# Patient Record
Sex: Female | Born: 1935 | Race: White | Hispanic: No | Smoking: Former smoker
Health system: Southern US, Community
[De-identification: ages and names within clinical notes are randomized; demographics above are authoritative.]

## PROBLEM LIST (undated history)

## (undated) DIAGNOSIS — K219 Gastro-esophageal reflux disease without esophagitis: Secondary | ICD-10-CM

## (undated) DIAGNOSIS — M1990 Unspecified osteoarthritis, unspecified site: Secondary | ICD-10-CM

## (undated) DIAGNOSIS — E785 Hyperlipidemia, unspecified: Secondary | ICD-10-CM

## (undated) DIAGNOSIS — R7989 Other specified abnormal findings of blood chemistry: Secondary | ICD-10-CM

## (undated) DIAGNOSIS — I5032 Chronic diastolic (congestive) heart failure: Secondary | ICD-10-CM

## (undated) DIAGNOSIS — J189 Pneumonia, unspecified organism: Secondary | ICD-10-CM

## (undated) DIAGNOSIS — E039 Hypothyroidism, unspecified: Secondary | ICD-10-CM

## (undated) DIAGNOSIS — E7849 Other hyperlipidemia: Secondary | ICD-10-CM

## (undated) DIAGNOSIS — I10 Essential (primary) hypertension: Secondary | ICD-10-CM

## (undated) DIAGNOSIS — R778 Other specified abnormalities of plasma proteins: Secondary | ICD-10-CM

## (undated) DIAGNOSIS — I495 Sick sinus syndrome: Secondary | ICD-10-CM

## (undated) DIAGNOSIS — I480 Paroxysmal atrial fibrillation: Secondary | ICD-10-CM

## (undated) DIAGNOSIS — I2510 Atherosclerotic heart disease of native coronary artery without angina pectoris: Secondary | ICD-10-CM

## (undated) DIAGNOSIS — K5792 Diverticulitis of intestine, part unspecified, without perforation or abscess without bleeding: Secondary | ICD-10-CM

## (undated) DIAGNOSIS — Z90711 Acquired absence of uterus with remaining cervical stump: Secondary | ICD-10-CM

## (undated) DIAGNOSIS — R4701 Aphasia: Secondary | ICD-10-CM

## (undated) DIAGNOSIS — Z87442 Personal history of urinary calculi: Secondary | ICD-10-CM

## (undated) DIAGNOSIS — Z9889 Other specified postprocedural states: Secondary | ICD-10-CM

## (undated) DIAGNOSIS — I214 Non-ST elevation (NSTEMI) myocardial infarction: Secondary | ICD-10-CM

## (undated) DIAGNOSIS — Z950 Presence of cardiac pacemaker: Secondary | ICD-10-CM

## (undated) HISTORY — PX: KNEE ARTHROSCOPY: SHX127

## (undated) HISTORY — DX: Other hyperlipidemia: E78.49

## (undated) HISTORY — PX: BLADDER SUSPENSION: SHX72

## (undated) HISTORY — DX: Acquired absence of uterus with remaining cervical stump: Z90.711

## (undated) HISTORY — PX: CARPAL TUNNEL RELEASE: SHX101

## (undated) HISTORY — DX: Sick sinus syndrome: I49.5

## (undated) HISTORY — PX: THYROIDECTOMY, PARTIAL: SHX18

## (undated) HISTORY — DX: Other specified abnormalities of plasma proteins: R77.8

## (undated) HISTORY — DX: Other specified abnormal findings of blood chemistry: R79.89

## (undated) HISTORY — PX: CHOLECYSTECTOMY: SHX55

---
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
## 1966-01-16 HISTORY — PX: THYROIDECTOMY: SHX17

## 1975-01-17 DIAGNOSIS — Z90711 Acquired absence of uterus with remaining cervical stump: Secondary | ICD-10-CM | POA: Insufficient documentation

## 1975-01-17 HISTORY — DX: Acquired absence of uterus with remaining cervical stump: Z90.711

## 1975-01-17 HISTORY — PX: ABDOMINAL HYSTERECTOMY: SHX81

## 1997-06-05 ENCOUNTER — Other Ambulatory Visit: Admission: RE | Admit: 1997-06-05 | Discharge: 1997-06-05 | Payer: Self-pay | Admitting: Cardiology

## 1998-01-26 ENCOUNTER — Encounter: Payer: Self-pay | Admitting: Surgery

## 1998-01-29 ENCOUNTER — Encounter: Payer: Self-pay | Admitting: Surgery

## 1998-01-29 ENCOUNTER — Ambulatory Visit (HOSPITAL_COMMUNITY): Admission: RE | Admit: 1998-01-29 | Discharge: 1998-01-30 | Payer: Self-pay | Admitting: Surgery

## 1998-12-16 ENCOUNTER — Encounter: Payer: Self-pay | Admitting: Family Medicine

## 1998-12-16 ENCOUNTER — Encounter: Admission: RE | Admit: 1998-12-16 | Discharge: 1998-12-16 | Payer: Self-pay | Admitting: Family Medicine

## 1999-07-05 ENCOUNTER — Encounter: Admission: RE | Admit: 1999-07-05 | Discharge: 1999-07-05 | Payer: Self-pay

## 2000-07-30 ENCOUNTER — Encounter: Payer: Self-pay | Admitting: Family Medicine

## 2000-07-30 ENCOUNTER — Encounter: Admission: RE | Admit: 2000-07-30 | Discharge: 2000-07-30 | Payer: Self-pay | Admitting: Family Medicine

## 2001-01-16 HISTORY — PX: MITRAL VALVE REPAIR: SHX2039

## 2001-01-16 HISTORY — PX: CORONARY ARTERY BYPASS GRAFT: SHX141

## 2001-02-18 ENCOUNTER — Encounter: Payer: Self-pay | Admitting: Orthopedic Surgery

## 2001-02-18 ENCOUNTER — Ambulatory Visit (HOSPITAL_COMMUNITY): Admission: RE | Admit: 2001-02-18 | Discharge: 2001-02-18 | Payer: Self-pay | Admitting: Orthopedic Surgery

## 2001-04-08 ENCOUNTER — Encounter: Admission: RE | Admit: 2001-04-08 | Discharge: 2001-04-08 | Payer: Self-pay | Admitting: Orthopedic Surgery

## 2001-04-08 ENCOUNTER — Encounter: Payer: Self-pay | Admitting: Orthopedic Surgery

## 2001-04-16 ENCOUNTER — Ambulatory Visit (HOSPITAL_BASED_OUTPATIENT_CLINIC_OR_DEPARTMENT_OTHER): Admission: RE | Admit: 2001-04-16 | Discharge: 2001-04-17 | Payer: Self-pay | Admitting: Orthopedic Surgery

## 2001-04-16 ENCOUNTER — Ambulatory Visit (HOSPITAL_BASED_OUTPATIENT_CLINIC_OR_DEPARTMENT_OTHER): Admission: RE | Admit: 2001-04-16 | Discharge: 2001-04-16 | Payer: Self-pay | Admitting: Orthopedic Surgery

## 2001-08-26 ENCOUNTER — Encounter: Payer: Self-pay | Admitting: *Deleted

## 2001-08-26 ENCOUNTER — Inpatient Hospital Stay (HOSPITAL_COMMUNITY): Admission: EM | Admit: 2001-08-26 | Discharge: 2001-09-04 | Payer: Self-pay | Admitting: *Deleted

## 2001-08-26 ENCOUNTER — Encounter (INDEPENDENT_AMBULATORY_CARE_PROVIDER_SITE_OTHER): Payer: Self-pay | Admitting: *Deleted

## 2001-08-27 ENCOUNTER — Encounter: Payer: Self-pay | Admitting: Thoracic Surgery (Cardiothoracic Vascular Surgery)

## 2001-08-28 ENCOUNTER — Encounter: Payer: Self-pay | Admitting: Thoracic Surgery (Cardiothoracic Vascular Surgery)

## 2001-08-29 ENCOUNTER — Encounter: Payer: Self-pay | Admitting: Thoracic Surgery (Cardiothoracic Vascular Surgery)

## 2001-08-30 ENCOUNTER — Encounter: Payer: Self-pay | Admitting: Thoracic Surgery (Cardiothoracic Vascular Surgery)

## 2001-09-30 ENCOUNTER — Encounter (HOSPITAL_COMMUNITY): Admission: RE | Admit: 2001-09-30 | Discharge: 2001-12-29 | Payer: Self-pay | Admitting: Cardiology

## 2001-10-11 ENCOUNTER — Encounter: Payer: Self-pay | Admitting: Emergency Medicine

## 2001-10-11 ENCOUNTER — Emergency Department (HOSPITAL_COMMUNITY): Admission: EM | Admit: 2001-10-11 | Discharge: 2001-10-11 | Payer: Self-pay | Admitting: Emergency Medicine

## 2002-02-06 ENCOUNTER — Ambulatory Visit (HOSPITAL_COMMUNITY): Admission: RE | Admit: 2002-02-06 | Discharge: 2002-02-06 | Payer: Self-pay | Admitting: Family Medicine

## 2003-02-03 ENCOUNTER — Ambulatory Visit (HOSPITAL_COMMUNITY): Admission: RE | Admit: 2003-02-03 | Discharge: 2003-02-03 | Payer: Self-pay | Admitting: Pediatrics

## 2003-07-16 ENCOUNTER — Encounter: Admission: RE | Admit: 2003-07-16 | Discharge: 2003-07-16 | Payer: Self-pay | Admitting: Cardiology

## 2003-10-23 ENCOUNTER — Emergency Department (HOSPITAL_COMMUNITY): Admission: EM | Admit: 2003-10-23 | Discharge: 2003-10-23 | Payer: Self-pay | Admitting: Emergency Medicine

## 2003-11-11 ENCOUNTER — Encounter: Admission: RE | Admit: 2003-11-11 | Discharge: 2003-11-11 | Payer: Self-pay | Admitting: Family Medicine

## 2004-08-19 ENCOUNTER — Encounter: Admission: RE | Admit: 2004-08-19 | Discharge: 2004-08-19 | Payer: Self-pay | Admitting: Family Medicine

## 2005-10-19 ENCOUNTER — Encounter: Admission: RE | Admit: 2005-10-19 | Discharge: 2005-10-19 | Payer: Self-pay | Admitting: Cardiology

## 2006-02-13 ENCOUNTER — Encounter: Admission: RE | Admit: 2006-02-13 | Discharge: 2006-02-13 | Payer: Self-pay | Admitting: Family Medicine

## 2006-12-11 ENCOUNTER — Encounter: Admission: RE | Admit: 2006-12-11 | Discharge: 2006-12-11 | Payer: Self-pay | Admitting: Cardiology

## 2006-12-19 ENCOUNTER — Encounter: Admission: RE | Admit: 2006-12-19 | Discharge: 2006-12-19 | Payer: Self-pay | Admitting: Family Medicine

## 2008-02-22 ENCOUNTER — Emergency Department (HOSPITAL_COMMUNITY): Admission: EM | Admit: 2008-02-22 | Discharge: 2008-02-22 | Payer: Self-pay | Admitting: Emergency Medicine

## 2009-01-18 ENCOUNTER — Encounter: Admission: RE | Admit: 2009-01-18 | Discharge: 2009-01-18 | Payer: Self-pay | Admitting: Cardiology

## 2009-02-11 ENCOUNTER — Inpatient Hospital Stay (HOSPITAL_BASED_OUTPATIENT_CLINIC_OR_DEPARTMENT_OTHER): Admission: RE | Admit: 2009-02-11 | Discharge: 2009-02-11 | Payer: Self-pay | Admitting: Cardiology

## 2009-03-20 HISTORY — PX: CARDIAC CATHETERIZATION: SHX172

## 2009-12-07 ENCOUNTER — Ambulatory Visit: Payer: Self-pay | Admitting: Cardiology

## 2010-02-06 ENCOUNTER — Encounter: Payer: Self-pay | Admitting: Family Medicine

## 2010-05-03 LAB — BASIC METABOLIC PANEL
BUN: 18 mg/dL (ref 6–23)
CO2: 28 mEq/L (ref 19–32)
Calcium: 9.8 mg/dL (ref 8.4–10.5)
Chloride: 96 mEq/L (ref 96–112)
Creatinine, Ser: 0.96 mg/dL (ref 0.4–1.2)
GFR calc Af Amer: >60 mL/min (ref >60)
GFR calc non Af Amer: 57 mL/min — ABNORMAL LOW (ref >60)
Glucose, Bld: 112 mg/dL — ABNORMAL HIGH (ref 70–99)
Potassium: 3.6 mEq/L (ref 3.5–5.1)
Sodium: 137 mEq/L (ref 135–145)

## 2010-05-03 LAB — DIFFERENTIAL
Basophils Absolute: 0 10*3/uL (ref 0.0–0.1)
Basophils Relative: 0 % (ref 0–1)
Eosinophils Absolute: 0.1 10*3/uL (ref 0.0–0.7)
Eosinophils Relative: 1 % (ref 0–5)
Lymphocytes Relative: 12 % (ref 12–46)
Lymphs Abs: 0.8 10*3/uL (ref 0.7–4.0)
Monocytes Absolute: 0.3 10*3/uL (ref 0.1–1.0)
Monocytes Relative: 5 % (ref 3–12)
Neutro Abs: 5.5 10*3/uL (ref 1.7–7.7)
Neutrophils Relative %: 81 % — ABNORMAL HIGH (ref 43–77)

## 2010-05-03 LAB — CBC
HCT: 36.9 % (ref 36.0–46.0)
Hemoglobin: 12.8 g/dL (ref 12.0–15.0)
MCHC: 34.8 g/dL (ref 30.0–36.0)
MCV: 88.9 fL (ref 78.0–100.0)
Platelets: 198 10*3/uL (ref 150–400)
RBC: 4.15 MIL/uL (ref 3.87–5.11)
RDW: 13.3 % (ref 11.5–15.5)
WBC: 6.8 10*3/uL (ref 4.0–10.5)

## 2010-06-03 NOTE — Cardiovascular Report (Signed)
NAME:  Heather [name], Heather [name]                        ACCOUNT NO.:  [id number]   MEDICAL RECORD NO.:  [id number]                   PATIENT TYPE:  INP   LOCATION:  2305                                 FACILITY:  [facility]   PHYSICIAN:  W. Doristine Church., M.D.         DATE OF BIRTH:  05-29-1930   DATE OF PROCEDURE:  08/27/2001  DATE OF DISCHARGE:                              CARDIAC CATHETERIZATION   HISTORY:  A 77 year old female with unstable angina with new T wave changes  noted inferiorly.   COMMENTS ABOUT PROCEDURE:  The patient tolerated the procedure well without  complications.  Following the procedure, she had an episode of angina  requiring nitroglycerin both intravenously and sublingual for relief and  good hemostasis, peripheral pulses present following the procedure.   HEMODYNAMIC DATA:  1. Aorta post contrast 133/58.  2. LV post contrast 133/23.   ANGIOGRAPHIC DATA:  1. Left ventriculogram:  Performed in the 30-degree RAO projection.  The     aortic valve is normal.  The mitral valve is normal.  There is mitral     annular calcification present, however.  The left ventricle is normal in     size with normal wall motion.  The estimated ejection fraction was 60%.  2. Coronary arteries arise and distribute normally.  Calcification is seen     involving the LAD and the proximal right coronary artery.  3. The left main coronary artery appears normal.  4. The left anterior descending has a segmental 50-60% stenosis at the     diagonal branch extending to the mid portion.  Mild irregularities are     present distally.  The diagonal branch has a ostial 60% stenosis.  5. The circumflex has an eccentric 60-70% mid vessel stenosis prior to a     large marginal branch.  6. The right coronary artery _____________ is diffusely diseased and     calcified proximally followed by an area of 80% eccentric narrowing and     haziness, then another 95-99% narrowing which is eccentric, followed  by a     long segment of diffuse 60-70% narrowing prior to the posterolateral     branch.  Collaterals from the posterolateral branch are seen from the     left coronary system.   IMPRESSION:  1. Significant three-vessel coronary artery disease, predominant right     coronary artery and circumflex, but the left anterior descending artery     is moderately severe.  2. Preserved left ventricular function with mitral annular calcification,     elevated left ventricular end diastolic pressure.    RECOMMENDATIONS:  The right coronary artery appears somewhat unfavorable for  percutaneous intervention due to the length and diffuseness of disease.  She  has continued chest pain despite medical therapy and may be referred for  bypass grafting.  Kerry Hough., M.D.    WST/MEDQ  D:  08/27/2001  T:  08/30/2001  Job:  [id number]   cc:   Thomas A. Mare Ferrari, M.D.

## 2010-06-03 NOTE — Discharge Summary (Signed)
NAME:  Heather [name], Heather [name]                        ACCOUNT NO.:  [id number]   MEDICAL RECORD NO.:  [id number]                   PATIENT TYPE:  INP   LOCATION:  2003                                 FACILITY:  [facility]   PHYSICIAN:  Valentina Gu. Roxy Manns, M.D.              DATE OF BIRTH:  1930/03/05   DATE OF ADMISSION:  08/26/2001  DATE OF DISCHARGE:  09/04/2001                                 DISCHARGE SUMMARY   PRIMARY ADMISSION DIAGNOSES:  1. Severe three-vessel coronary artery disease.  2. Unstable angina.   ADDITIONAL DIAGNOSES:  1. History of myocardial infarction in 1980.  2. History of congestive heart failure.  3. Hypertriglyceridemia.  4. History of partial thyroidectomy.  5. 3+ mitral regurgitation.  6. Postoperative atrial fibrillation.   PROCEDURES PERFORMED:  1. Cardiac catheterization.  2. Coronary artery bypass grafting x3 (left internal mammary artery to the     LAD, saphenous vein graft to the circumflex, and saphenous vein graft to     the posterolateral).  3. Endoscopic vein harvest right thigh to below knee.  4. Mitral valve repair with #26 Seguin annuloplasty ring.   HISTORY OF PRESENT ILLNESS:  The patient is a 77 year old female with a  history of previous myocardial infarction in 86.  She had sone well until  the date of this admission at which time she developed left-sided,  substernal chest pain.  She took a sublingual nitroglycerin and did obtain  relief; however, she continued to have multiple episodes of pain throughout  the day.  Her fourth episode persisted despite several sublingual  nitroglycerin tablets and therefore she called EMS and was brought to the  emergency room at [facility].  She was seen by cardiology and was  admitted for further evaluation.   HOSPITAL COURSE:  She was ruled out for myocardial infarction.  She did  undergo cardiac catheterization which showed severe three-vessel coronary  artery disease.  It was recommended  that she proceed with surgical  revascularization at this time.  She was seen by Dr. Roxy Manns and it was agreed  that coronary artery bypass graft was her best option.  She was taken to the  operating room on August 13 and underwent coronary artery bypass graft x3  with the above-noted grafts.  Preoperative transesophageal echocardiogram  noted moderate, 3+ mitral regurgitation; therefore, mitral valve repair was  also undertaken with a #26 Seguin annuloplasty ring.  She tolerated the  procedure well and no evidence of mitral regurgitation was noted on  postoperative transesophageal echocardiogram.  She was transferred to the  SICU in stable condition.  She remained intubated until the morning of  postoperative day #1 at which time she was extubated without difficulty.  She was initially maintained on dopamine and Neo-Synephrine drips; however,  these were weaned and discontinued on postoperative day #1.  By  postoperative day #2 she developed atrial fibrillation and was started on an  IV amiodarone drip.  Late in the day on postoperative day #2 she was  switched to p.o. amiodarone, was started on Coumadin and was transferred to  the floor.  She was also transfused one unit of packed red blood cells at  that time for a hemoglobin of 7.8 and a hematocrit of 23.  This did bring  her hemoglobin and hematocrit up to 8.3 and 24.4, respectively.  However,  over the course of the next several days she continued to remain hypotensive  and complained of dizziness.  A repeat blood count showed persistent anemia  of 7.9 and 23.1, respectively.  She was again transfused as she had been  significantly symptomatic.  Her most recent hemoglobin and hematocrit are  10.8 and 31, and she is maintaining blood pressures within normal limits.  Her symptoms of dizziness have resolved.  She has also been diuresed as she  was volume overloaded postoperatively.  She is still well above her  preoperative weight but is  diuresing quite well.  She has been ambulating in  the halls with cardiac rehabilitation phase I and is doing fairly well.  She  is tolerated a regular diet and is having normal bowel and bladder function.  Her incisions are healing well and it is felt that if she remains stable she  will be ready for discharge on September 04, 2001.   DISCHARGE MEDICATIONS:  1. Coumadin, dose to be determined on the morning of discharge.  2. Amiodarone 200 mg b.i.d.  3. Atenolol 12.5 mg b.i.d.  4. Protonix 40 mg b.i.d.  5. Tylox 1-2 q.4h. p.r.n. for pain.  6. Lasix 40 mg b.i.d. x2.  7. K-Dur 20 mEq b.i.d. x2 weeks.  8. Synthroid 0.1 mg p.o. q.d.  9. Lopid 600 mg b.i.d.   DISCHARGE INSTRUCTIONS:  She is to refrain from driving, heavy lifting or  strenuous activity.  She may continue a low-fat, low-sodium diet.  She is  asked to shower and clean her incisions with soap and water.  She will  follow up with Dr. Mare Ferrari in two weeks and have chest x-ray performed at  that time.  She will then see Dr. Roxy Manns in the office on Monday, September 30, 2001, at 10:30 a.m., and is asked to bring her chest x-ray to this  appointment.  Home health nursing will be arranged to draw pro time on  Mondays and Thursdays, and call these results to Dr. Sherryl Barters office for  further Coumadin dosing.  At the time of discharge her INR is 2.1.     Gina L. Theda Sers, P.A.                     Valentina Gu. Roxy Manns, M.D.    GLC/MEDQ  D:  09/03/2001  T:  09/05/2001  Job:  [id number]   cc:   Nicholaus Bloom, M.D.  [address]  [place],   [address]  Fax: [phone]   Joyice Faster. Mare Ferrari, M.D.
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
## 2010-06-03 NOTE — Op Note (Signed)
NAME:  Heather [name], Heather [name]                        ACCOUNT NO.:  [id number]   MEDICAL RECORD NO.:  [id number]                   PATIENT TYPE:  INP   LOCATION:  2305                                 FACILITY:  [facility]   PHYSICIAN:  Valentina Gu. Roxy Manns, M.D.              DATE OF BIRTH:  09-06-1930   DATE OF PROCEDURE:  08/28/2001  DATE OF DISCHARGE:                                 OPERATIVE REPORT   PREOPERATIVE DIAGNOSIS:  Severe three-vessel coronary artery disease with  class 4 unstable angina, class 4 congestive heart failure and mild mitral  regurgitation.   POSTOPERATIVE DIAGNOSIS:  Severe three-vessel coronary artery disease with  class 4 unstable angina, class 4 congestive heart failure and moderate  mitral regurgitation and mitral valve prolapse.   PROCEDURES:  1. Median sternotomy for coronary artery bypass grafting x three (left     internal mammary artery to distal left anterior descending coronary     artery, saphenous vein graft to the circumflex marginal branch, saphenous     vein graft to right posterolateral branch).  2. Mitral valve repair (quadrangular resection, posterior leaflet with     artificial cords x three and 26-mm Seguin ring annuloplasty).   SURGEON:  Valentina Gu. Roxy Manns, M.D.   ASSISTANT:  Arlyn Leak. Theda Sers, P.A.   ANESTHESIA:  General.   BRIEF CLINICAL NOTE:  The patient is a 77 year old white female followed by  Dr. Claris Gower and Dr. Warren Danes and referred by Dr. Tollie Eth  for management of coronary artery disease.  The patient presents with a six-  month history of progressive symptoms of congestive heart failure.  She  developed severe congestive heart failure in April of this year after having  undergone shoulder surgery.  She now presents with new onset symptoms of  chest pain consistent with class 4 unstable angina.  She was admitted to the  hospital and ruled out for acute myocardial infarction although had  borderline positive cardiac  enzymes.  Cardiac catheterization performed by  Dr. Wynonia Lawman demonstrates severe three-vessel coronary artery disease with  preserved left ventricular function and evidence of mild mitral valve  prolapse and trace to mild mitral regurgitation.  A full consultation note  has been dictated previously.   OPERATIVE CONSENT:  The patient and her family have been counseled at length  regarding the indications and potential benefits of coronary artery bypass  grafting.  Alternatives treatments strategies have been discussed.  They  understand and accept all associated risks of surgery including but not  limited to risk of death, stroke, myocardial infarction, congestive heart  failure, respiratory failure, bleeding requiring blood transfusion,  arrhythmia, infection, and recurrent coronary artery disease.  They also  understand the tentative plans for use of intraoperative transesophageal  echocardiogram to evaluate the patient's mitral valve and mitral  regurgitation with possible need for concomitant mitral valve repair or  placement if felt indicated.  Preoperatively this  is felt likely not to be  necessary but nonetheless a possibility.  All their questions have been  addressed.   OPERATIVE NOTE IN DETAIL:  The patient was brought to the operating room on  the above mentioned date and central monitoring was established by the  anesthesia service under the care and direction of Dr. Tamela Gammon.  Specifically, a Swan-Ganz catheter was placed through the right internal  jugular approach.  A left radial arterial line is placed.  Intravenous  antibiotics are administered.  The patient was placed in the supine position  on the operating table.  Following induction with general endotracheal  anesthesia, a Foley catheter was placed.   Transesophageal echocardiogram is performed by Dr. Tobias Alexander.  This documents  the presence of normal left ventricular function.  There is at least  moderate mitral  regurgitation.  There is some mild to moderate prolapse of  the posterior leaflet of the mitral valve with a broad-based jet of mitral  regurgitation which traverses the entire left atrium.  This occurs under  normal hemodynamic conditions with the patient's blood pressure relatively  low and no evidence of ongoing coronary ischemia, as well as concomitant  normal left ventricular systolic function.  No other significant  abnormalities were identified.  Due to the severity of the patient's mitral  regurgitation, concomitant surgical intervention was felt to be indicated.   The patient's chest, abdomen, both groins, and both lower extremities are  prepared and draped in a sterile manner.  A median sternotomy incision is  performed.  The left internal mammary artery is dissected from the chest  wall and prepared for bypass grafting.  The left internal mammary artery is  notably good quality conduit although some small caliber.  Simultaneously  the saphenous vein is obtained from the patient's right thigh using  endoscopic vein harvest technique.  The saphenous vein is notably fair  quality conduit.  The patient is heparinized systemically.   The pericardium is opened.  The ascending aorta is normal in appearance.  The ascending aorta is cannulated for cardiopulmonary bypass.  A retrograde  cardioplegia catheter is placed through the right atrium into the coronary  sinus.  Dual venous cannulation is obtained placing a right-angle metal tip  cannula directly in the superior vena cava with the second straight cannula  low in the right atrium with the tip extending down the inferior vena cava.  Adequate heparinization is verified.   Cardiopulmonary bypass is begun and the surface of the heart is inspected.  Distal sites are selected for coronary bypass grafting.  Portions of the  saphenous vein and the left internal mammary artery are trimmed to appropriate length.  A temperature probe is  placed in the left ventricular  septum.  The cardioplegia catheter is placed in the ascending aorta.  Vessel  loops are placed around both the superior and inferior vena cavae.   The patient is cooled to 28-degrees systemic temperature.  The aortic  crossclamp is applied and cardioplegia is delivered initially in an  antegrade fashion through the aortic root.  Iced saline slush is applied for  topical hypothermia.  The initial cardioplegic arrest and myocardial cooling  are felt to be excellent.  Supplemental cardioplegia is administered  retrograde through the coronary sinus catheter.  Repeat doses of  cardioplegia are administered intermittently throughout the crossclamp  portion of the operation, antegrade through the aortic root, antegrade  through subsequently placed vein graft, and retrograde through the coronary  sinus catheter  to maintain acceptable temperature below 15-degrees  centigrade.  The following distal coronary anastomoses are performed:   1. The posterolateral branch off the distal right coronary artery is grafted     with a saphenous vein graft in an end-to-side fashion.  The coronary     measured 1.5 mm in diameter and is of good quality at the site of distal     bypass.  The posterior descending coronary artery is very small and felt     not to be a graftable target.  The posterolateral branch is the single     and most dominant branch off the distal right coronary circulation.   1. The circumflex marginal branch is grafted with the saphenous vein graft     in an end-to-side fashion.  The coronary measures 1.4 mm in diameter at     the site of distal bypass and is a fair to good quality target.   1. The distal left anterior descending coronary artery is grafted with the     left internal mammary artery in an end-to-side fashion.  This coronary     measured 1.4 mm in diameter and is of fair quality.   The left atriotomy incision is performed posteriorly through the  atrial  groove.  The mitral valve is exposed using a self-retaining retractor.  Exposure is felt to be excellent and visualization excellent.  The mitral  valve was carefully inspected.  There is evidence of annular dilatation as  well as moderate prolapse of the posterior leaflet and mitral valve and mild  prolapse of the anterior leaflet of the mitral valve.  There is one knuckle  of a portion of the P2 portion of the posterior leaflet which is more  severely prolapsed.  All of this subvalvular apparatus is intact.  A small  quadrangular resection is performed involving the more severely portion of  prolapse in the posterior leaflet which is located somewhat eccentrically in  the P2 portion of the leaflet towards the posterior commissure.  A small  sliding leaflet annuloplasty is then performed and the intervening defect  repaired using interrupted 5-0 Ethibond sutures.  A single figure-of-eight large 2-0 Ethibond compression suture is placed prior to completion of the  sliding annuloplasty at the apex of this defect.  A single 6-0 Gore-Tex  artificial cord is also replaced to the free edge of the posterior leaflet  at the site of leaflet defect reconstruction to further stabilize the  repair.  Two additional 6-0 Gore-Tex artificial cords are utilized to  correct the mild amount of prolapse of the A2 portion of the anterior  leaflet.  Subsequently ring annuloplasty is performed using interrupted 2-0  Ethibond horizontal mattressed sutures placed circumferentially around the  entire mitral valve annulus.  Initially, a 28-mm Seguin annuloplasty ring is  inserted without difficulty however, immediately after completion of this it  is apparent that the 28-mm ring is slightly too big with mild residual  mitral regurgitation due to incomplete coaptation in the mid portion of the  valve.  The 28-mm ring is then removed and annuloplasty is again performed  using interrupted 2-0 Ethibond  horizontal mattressed sutures placed  circumferentially around the valve with a 26-mm Seguin annuloplasty ring.  After completion of the closure, the valve appears to be perfectly competent  while instilling iced saline into the left ventricular chamber.  Rewarming  is begun.  The left atrial appendage is oversewn from within the left atrium  using a two-layer closure  of running 3-0 Prolene suture.  The left atriotomy  incision is closed using a standard two-layer closure with running 3-0  Prolene suture.  Just prior to completion of the closure of the atriotomy  incision, maneuvers are performed to evacuate air from the pulmonary veins  and left heart circulation.   Both proximal saphenous vein anastomoses are performed directly to the  ascending aorta prior to removal of the aortic crossclamp.  The septal  temperature is noted to rise rapidly and dramatically upon reperfusion of  the left internal mammary artery.  The patient is placed in Trendelenburg  position and one final dose of warm retrograde hot-shot cardioplegia is  administered.  The lungs are vigorously ventilated and the heart is allowed  to fill in order to further flush the pulmonary veins and left heart  circulation of any residual air.  The aortic crossclamp is removed after a  total crossclamp time of 189 minutes.   The heart begins to beat spontaneously without need for cardioversion.  The  retrograde cardioplegia is removed.  The aortic root is entered using a  Magoon needle.  All proximal and distal anastomoses are inspected for  hemostasis and appropriate graft orientation.  Epicardial pacing wires are  affixed to the right ventricle and packed into the right atrial appendage.  The inferior vena cava cannula is removed and the cannulation site oversewn  with 3-0 Prolene suture.  Low-dose dopamine infusion is begun.   The patient is weaned from cardiopulmonary bypass without difficulty.  The patient's rhythm at  separation from bypass is a slow junctional rhythm  requiring AV sequential pacing.  Total cardiopulmonary time for the  operation is 235 minutes.  The patient is weaned from bypass on dopamine at  5 mcg/kg per minute.  Followup transesophageal echocardiogram, performed by  Dr. Lillia Abed, after separation from bypass, demonstrates preserved left  ventricular function with a well-seated mitral valve annuloplasty ring and  no residual mitral regurgitation.  There is no significant residual air in  the left heart circulation.   The aortic root vent is removed.  The SVC cannula is removed.  The aortic  cannula is removed.  Protamine is administered to reverse the  anticoagulation.  The mediastinum and both left and right pleural spaces are  irrigated with saline solution containing vancomycin.  Meticulous surgical  hemostasis is ascertained.  The mediastinum and both pleural spaces are  drained with four chest tubes placed through separate stab incisions  inferiorly.  The median sternotomy is closed in a routine fashion.  The  right lower extremity incisions are closed in multiple layers in a routine  fashion.  All skin incisions are closed with subcuticular skin closures.   The patient tolerated the procedure well and is transported to the Surgical  Intensive Care Unit in stable condition.  There are no intraoperative  complications.  All sponge, instrument and needle counts are verified  correct at completion of the operation.  The patient was transfused three  units of packed red blood cells during cardiopulmonary bypass due to anemia.  The patient was transfused two units of fresh frozen plasma after separation  from bypass and reversal of heparin with protamine due to mild coagulopathy.                                               Braulio Conte  Keturah Barre, M.D.    CHO/MEDQ  D:  08/28/2001  T:  08/30/2001  Job:  [id number]   cc:   Thomas A. Mare Ferrari, M.D.   Kerry Hough., M.D.    Nicholaus Bloom, M.D.  [address]  [place], [place]  [address]  Fax: [phone]   Valentina Gu. Roxy Manns, M.D.

## 2010-06-03 NOTE — Cardiovascular Report (Signed)
NAME:  Heather [name], Heather [name]                        ACCOUNT NO.:  [id number]   MEDICAL RECORD NO.:  [id number]                   PATIENT TYPE:  OIB   LOCATION:  2858                                 FACILITY:  [facility]   PHYSICIAN:  Peter M. Martinique, M.D.               DATE OF BIRTH:  [birth year]   DATE OF PROCEDURE:  02/03/2003  DATE OF DISCHARGE:  02/03/2003                              CARDIAC CATHETERIZATION   INDICATION FOR PROCEDURE:  The patient is status post coronary artery bypass  surgery in August 2003.  She has had recent atypical chest symptoms.  Cardiolite study was suggestive of anterior wall ischemia.   PROCEDURE:  Left heart catheterization, coronary angiography, graft  angiography including mammary artery graft angiography and left subclavian  angiography.  Left ventricular angiography.   ACCESS:  Via the right femoral artery using standard Seldinger technique.   EQUIPMENT:  6 French 4 cm right and left Judkins catheter, 6 French pigtail  catheter, 6 French LIMA catheter and 6 French right vein graft catheter.   CONTRAST:  220 mL of Omnipaque.   MEDICATIONS:  Local anesthesia with 1% Xylocaine, Versed 2 mg IV.   HEMODYNAMIC DATA:  Aortic pressure 158/74 with a mean of 106.  Left  ventricular pressure is 165 with EDP of 26 mmHg.   ANGIOGRAPHIC DATA:  The left coronary artery arises and distributes  normally.  The left main coronary artery is without significant disease.   The left anterior descending artery has segmental 60% stenosis in the  proximal to mid vessel.  There is 50% narrowing in the mid vessel.  The  distal vessel has competitive filling by IMA graft.   The left circumflex coronary artery is a small caliber vessel.  There is 90%  stenosis in the mid vessel with competitive filling of single marginal  vessel by vein graft.   The right coronary artery arises normally and is occluded proximally.   The saphenous vein graft to the PDA is widely patent with  excellent distal  runoff.   The saphenous vein graft to the obtuse marginal branch in the left  circumflex coronary artery is widely patent with good runoff.   The left subclavian is without significant stenosis.  The IMA graft comes  off of the subclavian after an acute angulation.  The IMA graft is widely  patent with good distal runoff to the LAD.   LEFT VENTRICULAR ANGIOGRAPHY:  Performed in the RAO view demonstrates normal  left ventricular size and systolic function with ejection fraction estimated  at 60%.  There is 2+ mitral insufficiency.   FINAL INTERPRETATION:  1. Severe three-vessel obstructive atherosclerotic coronary artery disease.  2. All grafts remain patent at this time including saphenous vein graft to     the obtuse marginal vessel, saphenous vein graft to the posterior     descending artery, and left internal mammary artery graft to the left  anterior descending.  3. Normal left ventricular function.                                               Peter M. Martinique, M.D.    PMJ/MEDQ  D:  02/03/2003  T:  02/04/2003  Job:  [id number]   cc:   Claris Gower, M.D.  [address]  [place], [place] [address]  Fax: [phone]   Darlin Coco, M.D.  [id number] [place]  [place] [address]  Fax: [phone]

## 2010-06-03 NOTE — Consult Note (Signed)
NAME:  Heather [name], Heather [name]                        ACCOUNT NO.:  [id number]   MEDICAL RECORD NO.:  [id number]                   PATIENT TYPE:  INP   LOCATION:  3740                                 FACILITY:  [facility]   PHYSICIAN:  Valentina Gu. Roxy Manns, M.D.              DATE OF BIRTH:  1930/03/24   DATE OF CONSULTATION:  DATE OF DISCHARGE:                       CARDIOVASCULAR/THORACIC CONSULTATION   REQUESTING PHYSICIAN:  Dr. Tollie Eth.   PRIMARY CARDIOLOGIST:  Dr. Darlin Coco.   PRIMARY CARE PHYSICIAN:  Dr. Claris Gower.   REASON FOR CONSULTATION:  Severe 3-vessel coronary artery disease with class  IV unstable angina, class IV congestive heart failure.   HISTORY:  The patient is a 77 year old, obese, white female with history of  coronary artery disease dating back to 1980, at which time she states she  suffered a mild myocardial infarction.  She apparently had catheterization  at that time, which was notable for a single vessel coronary artery disease.  She has been treated medically since then.  She did well up until the last 6  months or so.  She reports 3 to 6 month history of progressive symptoms of  exertional shortness of breath.  She underwent shoulder surgery this past  April, and apparently developed congestive heart failure and chest pain  following this procedure.  She was sent back to Dr. Mare Ferrari and reportedly  had a stress Cardiolite exam, which was unremarkable, although, the details  of this report are not currently available.  She notes that since then she  has continued to have intermittent episodes of aggressive shortness of  breath, orthopnea, bilateral lower extremity edema, and appears __________  with exertional chest discomfort.  She developed a more severe episode of  chest pain several days prior to admission and used sublingual Nitroglycerin  spray to relieve her symptoms.  Yesterday, the patient developed further  exacerbation of severe chest  pain requiring Nitroglycerin on 4 separate  occasions.  The final time the chest pain got quite severe and spread across  her chest and down her left arm.  EMS was called and she brought to the  emergency room for further management and therapy.  There she was initially  evaluated by Dr. Ilda Foil and noted to have T wave inversions on her resting  electrocardiogram in her inferior cardiac leads.  Serial cardiac enzymes  were obtained with a peak total CK of 138 with a CK-MB fraction of 1.7 and a  troponin I of 0.04.  The patient has remained medically stable since  hospital admission.  She was taken for cardiac catheterization today by Dr.  Wynonia Lawman.  This demonstrates severe 3-vessel coronary artery disease with  severe left ventricular function.  Cardiac surgical consultation is  requested.   REVIEW OF SYSTEMS:  GENERAL:  The patient was otherwise being well.  She has  good appetite and reports no changes in weight recently.  RESPIRATORY:  Notable for dyspnea  on exertion.  She denies recent history of  productive cough or history of hemoptysis or wheezing.  CARDIAC:  Notable for symptoms of chest pain occurring both at rest and with  activity.  She has had episodes of severe exertional shortness of breath  which has progressed over the last 6 to 7 months.  She has recently had  episodes of resting shortness of breath, as well as orthopnea and worsening  bilateral lower extremity edema.  She has occasional palpitations.  She  denies any syncopal episodes.  GASTROINTESTINAL:  Notable for some problems with GE reflux symptoms in the  past, although, these symptoms have apparently resolved.  She denies any  problems with aphagia, odynphagia, hematochezia, hematemesis, or melena.  She has not had problems with constipation.  NEUROLOGIC:  Negative.  MUSCULOSKELETAL:  Notable for chronic problems with degenerative joint  disease involving her back, both knees, and her shoulders.  GENITOURINARY:   Negative.  INFECTIOUS:  Negative.  HEMATOLOGIC:  Negative, although, the patient is noted to be anemic this  admission.  ENDOCRINE:  Negative recently.  The patient had partial thyroidectomy in the  past.  PSYCHIATRIC:  Negative.  PERIPHERAL VASCULAR:  Negative.  HEENT:  Negative.  The patient wears glasses.   PAST MEDICAL HISTORY:  Notable for coronary artery disease as previously  noted.  She has history of hypertension, hyperlipidemia, and a strong family  history of coronary artery disease.  She has history of arthritis and  degenerative joint disease.  The patient denies having known history of  diabetes mellitus or previous stroke.   PAST SURGICAL HISTORY:  Notable for right shoulder surgery for a rotator  cuff injury in April of this year.  She has undergone partial thyroidectomy  in the remote past.  Cholecystectomy in 2001, appendectomy in the distant  past, arthroscopy of her left knee in 1996, and repair of uterine prolapse  in 1954.  She has subsequently undergone hysterectomy in 1977.   SOCIAL HISTORY:  The patient lives with her husband and is retired and has a  fairly sedentary lifestyle.  She has a remote history of light tobacco use,  although, she quit smoking in 1956.  She denies any history of significant  alcohol consumption.   MEDICATIONS PRIOR TO ADMISSION:  Include Atenolol, Synthroid, Gemfibrozil,  Lasix, and Nitroglycerin spray.   DRUG SENSITIVITY:  CODEINE WHICH CAUSES NAUSEA AND VOMITING.   PHYSICAL EXAMINATION:  Notable for well-appearing, obese, white female with  blood pressure 130/55, pulse 50 and regular, oxygen saturation 96% on room  air and she is afebrile.  HEENT:  Essentially within normal limits.  She wears glasses.  NECK:  Supple.  There is no cervical or supraclavicular lymphadenopathy.  There is a well-healed transverse cervical incision from previous thyroid  surgery. CHEST:  Auscultation of the chest reveals clear and symmetrical  breath  sounds bilaterally.  There are a few bibasilar inspiratory crackles.  No  wheezes or rhonchi are demonstrated.  CARDIOVASCULAR:  Normal for regular rate and rhythm.  No murmurs, rubs, or  gallops are noted.  ABDOMEN:  Obese, soft, nontender.  No palpable masses.  EXTREMITIES:  Warm and well-perfused.  There is mild bilateral lower  extremity edema.  There is no obvious venous insufficiency.  Distal pulses  are not palpable in either lower leg at the ankle.  RECTAL:  Deferred.  GU:  Deferred.  NEUROLOGIC:  Grossly nonfocal.   DIAGNOSTIC TESTS:  Cardiac catheterization performed today by Dr. Wynonia Lawman is  reviewed.  This is notable for findings of severe 3-vessel coronary artery  disease with preserved left ventricular function.  Specifically, there is  long segment tubular 60% stenosis of the mid left anterior descending  coronary artery.  There is 70% stenosis of the mid left circumflex coronary  artery.  There is a diffusely diseased right coronary artery with 90%  proximal stenosis and 95% stenosis of the mid portion of this vessel with  eccentric lesion into irregularity.  The distal right coronary artery and  it's branches are small and diffusely diseased.  Left ventricular function  appears preserved.  There is some slight suggestion on the LV gram of  possible mitral valve prolapse with trace to mild mitral regurgitation.  No  other abnormalities are noted.  Baseline lab work are notable for normal  renal function with a creatinine of 1.0 and a baseline anemia with  hemoglobin 11.2 and a hematocrit of 32.7%.   IMPRESSION:  Severe 3-vessel coronary artery disease with class I unstable  angina and class IV congestive heart failure.  I believe that the patient  would best be treated with elective coronary artery bypass grafting.   PLAN:  I have outlined at length the indications and potential benefits of  surgical revascularization for the patient and her family.   Alternative  treatment strategies have been discussed.  They understand and accept all  associated risks of surgery including, but not limited to the risks of  death, stroke, myocardial infarction, congestive heart failure, respiratory  failure, bleeding requiring blood transfusion, arrhythmia, infection, and  recurrent coronary artery disease.  I have also discussed with her the  possibility of intervention on her mitral valve could be necessary,  although, this seems to be relatively unlikely.  We will perform  transesophageal echocardiogram at the time of surgery to further assess.  All of her questions have been addressed.  We plan surgery first thing  tomorrow.                                                    Valentina Gu. Roxy Manns, M.D.    CHO/MEDQ  D:  08/27/2001  T:  08/30/2001  Job:  [id number]   cc:   Thomas A. Mare Ferrari, M.D.   Kerry Hough., M.D.   Nicholaus Bloom, M.D.  [address] [place], [place]  [address]  Fax: [phone]

## 2010-06-03 NOTE — H&P (Signed)
NAME:  Heather [name], Heather [name] NO.:  [id number]   MEDICAL RECORD NO.:  [id number]                   PATIENT TYPE:  OIB   LOCATION:                                       FACILITY:  [facility]   PHYSICIAN:  Peter M. Martinique, M.D.               DATE OF BIRTH:  October 27, 1930   DATE OF ADMISSION:  02/03/2003  DATE OF DISCHARGE:                                HISTORY & PHYSICAL   HISTORY OF PRESENT ILLNESS:  Heather [name] is a 77 year old white female with  history of hypertension and hyperlipidemia.  She also has history of  coronary artery disease with remote myocardial infarction in 1980.  She  subsequently underwent coronary artery bypass surgery in August 2003 by Dr.  Roxy Manns with LIMA graft placed in the LAD, saphenous vein graft to the  circumflex, saphenous vein graft to the posterior lateral branch of the  right coronary artery.  Back in October, she experienced an episode of sharp  chest pain radiating down the  left arm.  She did not seek attention at that  time.  Since then, she has had some intermittent discomfort in her left  chest as well as pain in her shoulders and in her wrist.  Typically, this  occurs if she is at rest and does not seem to get worse with exertion.  She  did undergo a stress Cardiolite study on January 23, 2003. Her exercise  tolerance was very poor only walking 2 1/2 minutes on the Bruce protocol.  Her ECG showed a right bundle branch block.  Her Cardiolite images  demonstrated evidence of anterior and apical ischemia with overall well  preserved LV function, ejection fraction 69%.  Because of these findings,  she is now admitted for cardiac catheterization.   PAST MEDICAL HISTORY:  1. Arteriosclerotic cardiovascular disease, status post coronary artery     bypass graft as noted.  2. Hypertension.  3. Hypercholesterolemia.  4. Remote  history of congestive heart failure.  5. Status post right shoulder surgery.  6. Hypothyroidism, status post  partial thyroidectomy.   ALLERGIES:  CODEINE.   CURRENT MEDICATIONS:  1. Synthroid 0.5 mg daily.  2. Atenolol 12.5 mg b.i.d.  3. HCTZ 25 mg daily.  4. Ecotrin 325 mg daily.  5. Potassium 10 mEq daily.  6. Zantac 75 mg b.i.d.   SOCIAL HISTORY:  The patient is a nonsmoker, nondrinker.   FAMILY HISTORY:  Father died of myocardial infarction at age 87.  She states  that all her brothers except one have died of coronary disease in their 40s.   REVIEW OF SYSTEMS:  She denies any history of transient ischemic attack or  stroke.  She is limited by arthritis.  She does use a cane to walk. She has  no edema, orthopnea or PND.  Other review of systems are negative.   PHYSICAL EXAMINATION:  GENERAL:  The patient is an obese  white female in no  apparent distress.  VITAL SIGNS:  Weight is 187, blood pressure 106/70, pulse 64 and regular.  NECK:  She has jugular venous distention or bruits.  LUNGS:  Clear to auscultation and percussion.  CARDIAC:  Reveals regular  rate and rhythm.  Normal S1 and S2 with gallops,  murmurs, rubs or clicks.  ABDOMEN:  Soft and nontender.  She has no masses or hepatosplenomegaly.  Femoral and pedal pulses are 2+ and symmetric.  She has no edema.  NEUROLOGIC:  Nonfocal.   LABORATORY DATA:  ECG at rest demonstrates normal sinus rhythm with right  bundle branch block.  Chest x-ray shows previous coronary graft findings,  but no active disease.   IMPRESSION:  1. Arteriosclerotic cardiovascular disease status post coronary artery     bypass graft now with abnormal stress Cardiolite study showing evidence     of anterior apical ischemia.  2. Atypical chest pain.  3. Hypertension.  4. Hypercholesterolemia.  5. Obesity.   PLAN:  Will proceed with cardiac catheterization with further plans pending  these results.                                                Peter M. Martinique, M.D.    PMJ/MEDQ  D:  01/29/2003  T:  01/29/2003  Job:  [id number]   cc:   Darlin Coco, M.D.  [id number] [place] [address]  Fax: [phone]   Claris Gower, M.D.  [address]  [place], [place] [address]  Fax: [phone]

## 2010-06-03 NOTE — Op Note (Signed)
NAME:  Heather [name], Heather [name]                        ACCOUNT NO.:  [id number]   MEDICAL RECORD NO.:  [id number]                   PATIENT TYPE:  INP   LOCATION:  2003                                 FACILITY:  [facility]   PHYSICIAN:  Nelda Severe. Tobias Alexander, M.D.               DATE OF BIRTH:  12/14/1930   DATE OF PROCEDURE:  08/28/2001  DATE OF DISCHARGE:                                 OPERATIVE REPORT   INDICATION:  The patient is a white female who presents to the operating  room for coronary artery bypass grafting and possible mitral valve repair.  Dr. Valentina Gu. Roxy Manns requested a transesophageal echocardiogram for the  patient's surgical course.   PROCEDURE:  Transesophageal echocardiogram.   RADIOLOGIST:  Nelda Severe. Tobias Alexander, M.D.   DESCRIPTION OF PROCEDURE:  Following a routine cardiac induction,  transesophageal probe was lubricated and carefully inserted in the patient's  esophagus for cardiac imaging.  Initial overall images of the heart showed  no evidence of pericardial effusion or masses.  There was some rotation of  the heart to the patient's left, which made some images more difficult to  obtain.  The right atrium appeared normal in size.  There was no evidence of  masses.  The tricuspid valve appeared to be normal in structure with trace  regurgitation.  The pulmonary artery catheter was visualized passing through  the valve.  The right ventricle appeared normal in size with good  contractility.  The left atrium was then evaluated and the atrial septum.  There was no evidence of atrioseptal defect.  The left atrium and appendage  appeared clear of any thrombus or masses.  The overall size of the left  atrium was normal to slightly enlarged.  The mitral valve was then  evaluated, which demonstrated 3+ regurgitation.  There was a small portion  of the posterior leaflet which appeared to be prolapsing into the left  atrium.  The regurgitant jet appeared to pass mostly centrally located  but  slightly to the lateral wall of the atrium.  There was no evidence of heavy  calcium in the valve and no flail leaflets were noted.  Pulmonary vein flow  in the left and right pulmonary vein was normal.  The left ventricle was  difficult to clearly visualize due to the patient's habitus and the rotation  of the heart but overall appeared to have good contractility without  evidence of segmental wall motion abnormalities.  The aortic valve had three  leaflets that showed some evidence of sclerosis that did not appear to be  hemodynamically significant.  There was no evidence of regurgitation.  Based  on this evaluation and further consultation with the cardiologist, Dr. Roxy Manns  decided to repair the mitral valve with a prosthetic ring.  Following the  coronary artery bypass grafting and the ring insertion, the patient's heart  was evaluated again which showed significant improvement in the mitral  regurgitation.  The ring appeared to be in good place without evidence of  perivalvular leak.  There was trace regurgitation and overall significant  improvement following the repair.  The patient did well post bypass and the  transesophageal probe was carefully removed before taking the patient to the  intensive care unit on the ventilator.  Patient tolerated the procedure  well.                                               Nelda Severe Tobias Alexander, M.D.    JDS/MEDQ  D:  08/29/2001  T:  09/02/2001  Job:  [id number]

## 2010-06-03 NOTE — Op Note (Signed)
[facility]  Patient:    Heather [name], Heather [name] Visit Number: [id number] MRN: [id number]          Service Type: DSU Location: [facility] Attending Physician:  Isaac Laud Dictated by:   Youlanda Mighty Luisa Dago., M.D. Proc. Date: 04/16/01 Admit Date:  04/16/2001 Discharge Date: 04/16/2001   CC:         Zigmund Gottron. Arelia Sneddon, M.D.   Operative Report  PREOPERATIVE DIAGNOSIS:  Chronic right shoulder pain with clinical evidence of acromioclavicular arthropathy and probable rotator cuff tear followed by MRI documentation of full thickness rotator cuff tear, degenerative SLAP lesion and a full thickness supraspinatus rotator cuff tear.  POSTOPERATIVE DIAGNOSIS:  Chronic right shoulder pain with clinical evidence of acromioclavicular arthropathy and probable rotator cuff tear followed by MRI documentation of full thickness rotator cuff tear, degenerative SLAP lesion and a full thickness supraspinatus rotator cuff tear.  OPERATION PERFORMED: 1. Diagnostic arthroscopy, right glenohumeral joint. 2. Arthroscopic debridement of degenerative SLAP lesion with partial    debridement of biceps origin. 3. Arthroscopic subacromial decompression with bursectomy, coracoacromial    ligament release and acromioplasty. 4. Arthroscopic distal clavicle resection. 5. Open repair of supraspinatus rotator cuff tear.  SURGEON:  Youlanda Mighty. Sypher, Brooke Bonito., M.D.  ASSISTANT:  Julian Reil, P.A.  ANESTHESIA:  General orotracheal with supplemental interscalene block.  SUPERVISING ANESTHESIOLOGIST:  Dr. Chriss Driver.  INDICATIONS FOR PROCEDURE:  Heather [name] is a 77 year old woman who has had chronic shoulder pain.  Clinical examination revealed signs of a cuff tear due to chronic stage III impingement with a type 3 acromion and AC arthropathy. An MRI of the shoulder documented a full thickness rotator cuff tear of the supraspinatus that was mildly retracted.  There was  significant AC pathology and signs of chronic bursitis and a degenerative slap lesion at the posterior labrum.  Arrangements were made for diagnostic arthroscopy at this time followed by arthroscopic decompression, distal clavicle resection, anticipating open repair of the rotator cuff.  DESCRIPTION OF PROCEDURE:  Heather [name] was brought to the operating room and placed in supine position on the operating table.  Following induction of general orotracheal anesthesia, Heather [name] was carefully positioned in the beach chair position with the aid of a torso and head holder designed for shoulder arthroscopy.  The entire right upper extremity and forequarter were prepped with Betadine soap and solution and sterilely draped with impervious arthroscopy drapes. The scope was introduced through a standard posterior portal.  Diagnostic arthroscopy revealed abundant debris within the shoulder joint and easily documented the full thickness supraspinatus rotator cuff tear just posterior to the rotator interval.  The superior labrum was quite degenerative and detached from the biceps origin anteriorly to approximately 9 oclock posteriorly.  This was hanging within the joint and quite frayed.  Overall, the hyaline articular cartilage surface of the humeral head was intact as was the glenoid.  A 4.5 mm suction shaver was placed anteriorly under direct vision followed by use of suction shaver to debride the free fragments of the posterior SLAP lesion and debride approximately one third the thickness of the biceps origin.  The remaining portion of the biceps was stably attached anteriorly.  The anterior glenohumeral ligaments were intact and the subscapularis was intact.  The supraspinatus degenerative and had retracted from the greater tuberosity.  The infraspinatus was partially degenerative and the teres minor was normal.  The scope was removed from the glenohumeral joint and placed in the subacromial  space.  A florid bursitis was noted and photographed.  The suction shaver was used to perform a thorough debridement of the subacromial space. The acromioclavicular joint was quite degenerative.  The coracoacromial ligament was released with electrocautery and hemostasis achieved with bipolar cautery.  The acromion was leveled to a type 1 morphology.  The distal 18 mm of clavicle was removed arthroscopically.  Redundant bursa was debrided with a suction shaver.  The arthroscopic equipment was then retired and the supraspinatus tendon tear exposed through a lateral third deltoid muscle splitting incision approximately 2 cm in length.  The bursa was incised, fluid evacuated and redundant bursa resected.  A bony trough measuring 3 cm in width and 2 cm across was created with a suction bur, at one point undermining a portion of the infraspinatus to improve its attachment to the greater tuberosity.  Two Arthrex biocork screws were placed.  A 5.5 posteriorly and a 6.5 anteriorly.  The four mattress sutures from the two anchors were then used to repair the infraspinatus and supraspinatus anatomically.  An inset suture of #2 fiberwire Kevlar suture was used to inset the edge of the tendon to bone in the manner of McLaughlin.  A very satisfactory low profile repair was achieved.  The deltoid split was then thoroughly lavaged with sterile saline followed by repair of the apical portion of the muscle split with a mattress suture of #2 fiberwire followed by repair of the muscle split distally with simple sutures of 2-0 Vicryl.  The skin was repaired with subcutaneous sutures of 2-0 Vicryl and intradermal 3-0 Prolene with Steri-Strips.  A compressive dressing was applied.  The wound margins were infiltrated with 0.25% Marcaine followed by application of a sling.  Heather [name] was awakened from anesthesia and transferred to recovery room with stable vital signs.  Heather [name] will be discharged to recovery  care for 24 hour observation with  anticipated use of IV Ancef as prophylactic antibiotic and IV analgesics in the form of PCA morphine pump. Dictated by:   Youlanda Mighty Luisa Dago., M.D. Attending Physician:  Isaac Laud DD:  04/16/01 TD:  04/16/01 Job: [id number]

## 2010-08-31 ENCOUNTER — Encounter: Payer: Self-pay | Admitting: Nurse Practitioner

## 2010-09-01 ENCOUNTER — Encounter: Payer: Self-pay | Admitting: Nurse Practitioner

## 2010-09-07 ENCOUNTER — Ambulatory Visit: Payer: Self-pay | Admitting: Nurse Practitioner

## 2011-06-06 ENCOUNTER — Encounter (HOSPITAL_BASED_OUTPATIENT_CLINIC_OR_DEPARTMENT_OTHER): Payer: Self-pay | Admitting: *Deleted

## 2011-06-06 ENCOUNTER — Other Ambulatory Visit: Payer: Self-pay | Admitting: Orthopedic Surgery

## 2011-06-06 NOTE — Progress Notes (Signed)
To come in for labs Called dr Wynonia Lawman for notes and ekg

## 2011-06-07 ENCOUNTER — Encounter (HOSPITAL_BASED_OUTPATIENT_CLINIC_OR_DEPARTMENT_OTHER)
Admission: RE | Admit: 2011-06-07 | Discharge: 2011-06-07 | Disposition: A | Discharge status: Home / Self Care | Payer: Medicare Other | Source: Ambulatory Visit | Attending: Orthopedic Surgery | Admitting: Orthopedic Surgery

## 2011-06-07 LAB — BASIC METABOLIC PANEL
BUN: 9 mg/dL (ref 6–23)
CO2: 28 mEq/L (ref 19–32)
Calcium: 9.8 mg/dL (ref 8.4–10.5)
Chloride: 104 mEq/L (ref 96–112)
Creatinine, Ser: 0.78 mg/dL (ref 0.50–1.10)
GFR calc Af Amer: 90 mL/min — ABNORMAL LOW (ref >90)
GFR calc non Af Amer: 77 mL/min — ABNORMAL LOW (ref >90)
Glucose, Bld: 101 mg/dL — ABNORMAL HIGH (ref 70–99)
Potassium: 4.8 mEq/L (ref 3.5–5.1)
Sodium: 141 mEq/L (ref 135–145)

## 2011-06-08 NOTE — H&P (Signed)
Heather [name] is an 78 y.o. female.   Chief Complaint: c/o right thumb injury HPI:  Heather [name] is a well known former patient. She states on 05-31-11 she tripped on a rock falling hard onto her outstretched right hand. She saw Dr. Arelia Sneddon on 05-31-11 and declined x-rays at that time. She follows up with our office as she is well acquainted with our practice.   Past Medical History  Diagnosis Date  . Heart attack 1980  . S/P partial hysterectomy 1977  . Arthritis   . Coronary artery disease   . Hypothyroidism   . Hypertension   . Dysrhythmia     af  . CHF (congestive heart failure)   . GERD (gastroesophageal reflux disease)     Past Surgical History  Procedure Date  . Appendectomy 1950  . Thyroidectomy 1968  . Abdominal hysterectomy 1977  . Cholecystectomy   . Bladder suspension     tac  . Carpal tunnel release     right and left  . Knee arthroscopy     left  . Coronary artery bypass graft 2003  . Mitral valve repair 2003    with cabg  . Thyroidectomy, partial     left  . Cardiac catheterization 03,05,11    No family history on file. Social History:  reports that she quit smoking about 44 years ago. She does not have any smokeless tobacco history on file. She reports that she does not drink alcohol or use illicit drugs.  Allergies:  Allergies  Allergen Reactions  . Codeine     No prescriptions prior to admission    Results for orders placed during the hospital encounter of 06/09/11 (from the past 48 hour(s))  BASIC METABOLIC PANEL     Status: Abnormal   Collection Time   06/07/11 10:15 AM      Component Value Range Comment   Sodium 141  135 - 145 (mEq/L)    Potassium 4.8  3.5 - 5.1 (mEq/L)    Chloride 104  96 - 112 (mEq/L)    CO2 28  19 - 32 (mEq/L)    Glucose, Bld 101 (*) 70 - 99 (mg/dL)    BUN 9  6 - 23 (mg/dL)    Creatinine, Ser 0.78  0.50 - 1.10 (mg/dL)    Calcium 9.8  8.4 - 10.5 (mg/dL)    GFR calc non Af Amer 77 (*) >90 (mL/min)    GFR calc Af Amer  90 (*) >90 (mL/min)     No results found.   Pertinent items are noted in HPI.  Height 5' (1.524 m), weight 86.183 kg (190 lb).  General appearance: alert Head: Normocephalic, without obvious abnormality Neck: supple, symmetrical, trachea midline Resp: clear to auscultation bilaterally Cardio: regular rate and rhythm GI: normal findings: bowel sounds normal Extremities: Physical exam reveals a very pleasant 78 year old woman. Inspection of her hand reveals swelling and ecchymosis of her right thumb. She has tenderness with any attempted motion. She has full ROM of her fingers. The strength in her thumb was not tested.   Four views of her thumb demonstrate profoundly displaced proximal metaphyseal fracture with more than 45 degrees of angular deformity. This will lead to an adduction posture of her thumb. Pulses: 2+ and symmetric Skin: normal Neurologic: Grossly normal    Assessment/Plan Assessment: Displaced right thumb metacarpal base fracture.  Plan: Heather [name] would benefit from closed reduction and percutaneous Kirschner wire fixation vs ORIF of her fracture. We will schedule this on  an outpatient basis at the [facility] on 06-09-11.The procedure, risks and post-op course were discussed with the patient at length and she was in agreement with the plan.  DASNOIT,Deyra Perdomo J 06/08/2011, 2:29 PM    H&P documentation: 06/09/2011  -History and Physical Reviewed  -Patient has been re-examined  -No change in the plan of care  Cammie Sickle, MD

## 2011-06-09 ENCOUNTER — Encounter (HOSPITAL_BASED_OUTPATIENT_CLINIC_OR_DEPARTMENT_OTHER)
Admission: RE | Disposition: A | Discharge status: Home / Self Care | Payer: Self-pay | Source: Ambulatory Visit | Attending: Orthopedic Surgery

## 2011-06-09 ENCOUNTER — Encounter (HOSPITAL_BASED_OUTPATIENT_CLINIC_OR_DEPARTMENT_OTHER): Payer: Self-pay | Admitting: Certified Registered Nurse Anesthetist

## 2011-06-09 ENCOUNTER — Ambulatory Visit (HOSPITAL_BASED_OUTPATIENT_CLINIC_OR_DEPARTMENT_OTHER)
Admission: RE | Admit: 2011-06-09 | Discharge: 2011-06-09 | Disposition: A | Discharge status: Home / Self Care | Payer: Medicare Other | Source: Ambulatory Visit | Attending: Orthopedic Surgery | Admitting: Orthopedic Surgery

## 2011-06-09 ENCOUNTER — Encounter (HOSPITAL_BASED_OUTPATIENT_CLINIC_OR_DEPARTMENT_OTHER): Payer: Self-pay

## 2011-06-09 ENCOUNTER — Ambulatory Visit (HOSPITAL_BASED_OUTPATIENT_CLINIC_OR_DEPARTMENT_OTHER): Payer: Medicare Other | Admitting: Certified Registered Nurse Anesthetist

## 2011-06-09 DIAGNOSIS — S62233A Other displaced fracture of base of first metacarpal bone, unspecified hand, initial encounter for closed fracture: Secondary | ICD-10-CM | POA: Insufficient documentation

## 2011-06-09 DIAGNOSIS — I10 Essential (primary) hypertension: Secondary | ICD-10-CM | POA: Insufficient documentation

## 2011-06-09 DIAGNOSIS — Z951 Presence of aortocoronary bypass graft: Secondary | ICD-10-CM | POA: Insufficient documentation

## 2011-06-09 DIAGNOSIS — I509 Heart failure, unspecified: Secondary | ICD-10-CM | POA: Insufficient documentation

## 2011-06-09 DIAGNOSIS — K219 Gastro-esophageal reflux disease without esophagitis: Secondary | ICD-10-CM | POA: Insufficient documentation

## 2011-06-09 DIAGNOSIS — I2510 Atherosclerotic heart disease of native coronary artery without angina pectoris: Secondary | ICD-10-CM | POA: Insufficient documentation

## 2011-06-09 DIAGNOSIS — W010XXA Fall on same level from slipping, tripping and stumbling without subsequent striking against object, initial encounter: Secondary | ICD-10-CM | POA: Insufficient documentation

## 2011-06-09 HISTORY — DX: Gastro-esophageal reflux disease without esophagitis: K21.9

## 2011-06-09 HISTORY — DX: Hypothyroidism, unspecified: E03.9

## 2011-06-09 HISTORY — DX: Atherosclerotic heart disease of native coronary artery without angina pectoris: I25.10

## 2011-06-09 HISTORY — DX: Essential (primary) hypertension: I10

## 2011-06-09 HISTORY — DX: Unspecified osteoarthritis, unspecified site: M19.90

## 2011-06-09 LAB — POCT HEMOGLOBIN-HEMACUE: Hemoglobin: 11 g/dL — ABNORMAL LOW (ref 12.0–15.0)

## 2011-06-09 SURGERY — CLOSED REDUCTION, FRACTURE, METACARPAL BONE, WITH PERCUTANEOUS PINNING
Anesthesia: General | Site: Thumb | Laterality: Right | Wound class: Clean

## 2011-06-09 MED ORDER — LACTATED RINGERS IV SOLN
INTRAVENOUS | Status: DC
  Administered 2011-06-09: 10:00:00 via INTRAVENOUS

## 2011-06-09 MED ORDER — TRAMADOL HCL 50 MG PO TABS: ORAL_TABLET | ORAL | Status: AC

## 2011-06-09 MED ORDER — METOCLOPRAMIDE HCL 5 MG/ML IJ SOLN
INTRAMUSCULAR | Status: DC | PRN
  Administered 2011-06-09: 10 mg via INTRAVENOUS

## 2011-06-09 MED ORDER — PROPOFOL 10 MG/ML IV EMUL
INTRAVENOUS | Status: DC | PRN
  Administered 2011-06-09: 180 mg via INTRAVENOUS

## 2011-06-09 MED ORDER — CEPHALEXIN 500 MG PO CAPS: 500.0000 mg | ORAL_CAPSULE | Freq: Three times a day (TID) | ORAL | Status: AC

## 2011-06-09 MED ORDER — ONDANSETRON HCL 4 MG/2ML IJ SOLN
INTRAMUSCULAR | Status: DC | PRN
  Administered 2011-06-09: 4 mg via INTRAVENOUS

## 2011-06-09 MED ORDER — LIDOCAINE HCL 2 % IJ SOLN
INTRAMUSCULAR | Status: DC | PRN
  Administered 2011-06-09: 4.5 mL

## 2011-06-09 MED ORDER — METOCLOPRAMIDE HCL 5 MG/ML IJ SOLN: 10.0000 mg | Freq: Once | INTRAMUSCULAR | Status: DC | PRN

## 2011-06-09 MED ORDER — FENTANYL CITRATE 0.05 MG/ML IJ SOLN
INTRAMUSCULAR | Status: DC | PRN
  Administered 2011-06-09 (×2): 25 ug via INTRAVENOUS

## 2011-06-09 MED ORDER — CHLORHEXIDINE GLUCONATE 4 % EX LIQD: 60.0000 mL | Freq: Once | CUTANEOUS | Status: DC

## 2011-06-09 MED ORDER — DEXAMETHASONE SODIUM PHOSPHATE 10 MG/ML IJ SOLN
INTRAMUSCULAR | Status: DC | PRN
  Administered 2011-06-09: 5 mg via INTRAVENOUS

## 2011-06-09 MED ORDER — FENTANYL CITRATE 0.05 MG/ML IJ SOLN
25.0000 ug | INTRAMUSCULAR | Status: DC | PRN
  Administered 2011-06-09 (×3): 25 ug via INTRAVENOUS

## 2011-06-09 MED ORDER — LIDOCAINE HCL (CARDIAC) 20 MG/ML IV SOLN
INTRAVENOUS | Status: DC | PRN
  Administered 2011-06-09: 50 mg via INTRAVENOUS

## 2011-06-09 SURGICAL SUPPLY — 64 items
5300-4-45 (Orthopedic Implant) ×2 IMPLANT
BANDAGE ADHESIVE 1X3 (GAUZE/BANDAGES/DRESSINGS) IMPLANT
BANDAGE ELASTIC 3 VELCRO ST LF (GAUZE/BANDAGES/DRESSINGS) ×2 IMPLANT
BANDAGE ELASTIC 4 VELCRO ST LF (GAUZE/BANDAGES/DRESSINGS) IMPLANT
BANDAGE GAUZE ELAST BULKY 4 IN (GAUZE/BANDAGES/DRESSINGS) ×2 IMPLANT
BLADE MINI RND TIP GREEN BEAV (BLADE) IMPLANT
BLADE SURG 15 STRL LF DISP TIS (BLADE) ×1 IMPLANT
BLADE SURG 15 STRL SS (BLADE) ×2
BNDG CMPR 9X4 STRL LF SNTH (GAUZE/BANDAGES/DRESSINGS) ×1
BNDG CMPR MD 5X2 ELC HKLP STRL (GAUZE/BANDAGES/DRESSINGS) ×1
BNDG COHESIVE 1X5 TAN STRL LF (GAUZE/BANDAGES/DRESSINGS) IMPLANT
BNDG ELASTIC 2 VLCR STRL LF (GAUZE/BANDAGES/DRESSINGS) ×1 IMPLANT
BNDG ESMARK 4X9 LF (GAUZE/BANDAGES/DRESSINGS) ×1 IMPLANT
BRUSH SCRUB EZ PLAIN DRY (MISCELLANEOUS) ×2 IMPLANT
CANISTER SUCTION 1200CC (MISCELLANEOUS) IMPLANT
CAP PIN PROTECTOR ORTHO WHT (CAP) ×1 IMPLANT
CLOTH BEACON ORANGE TIMEOUT ST (SAFETY) ×2 IMPLANT
CORDS BIPOLAR (ELECTRODE) IMPLANT
COVER MAYO STAND STRL (DRAPES) ×2 IMPLANT
COVER TABLE BACK 60X90 (DRAPES) ×2 IMPLANT
CUFF TOURNIQUET SINGLE 18IN (TOURNIQUET CUFF) ×1 IMPLANT
DECANTER SPIKE VIAL GLASS SM (MISCELLANEOUS) IMPLANT
DRAPE EXTREMITY T 121X128X90 (DRAPE) ×2 IMPLANT
DRAPE OEC MINIVIEW 54X84 (DRAPES) ×2 IMPLANT
DRAPE SURG 17X23 STRL (DRAPES) ×2 IMPLANT
GLOVE BIO SURGEON STRL SZ 6 (GLOVE) ×1 IMPLANT
GLOVE BIOGEL M STRL SZ7.5 (GLOVE) ×2 IMPLANT
GLOVE ECLIPSE 8.5 STRL (GLOVE) ×2 IMPLANT
GLOVE INDICATOR 6.5 STRL GRN (GLOVE) ×1 IMPLANT
GLOVE ORTHO TXT STRL SZ7.5 (GLOVE) ×2 IMPLANT
GOWN PREVENTION PLUS XLARGE (GOWN DISPOSABLE) ×1 IMPLANT
GOWN PREVENTION PLUS XXLARGE (GOWN DISPOSABLE) ×2 IMPLANT
KWIRE 4.0 X .045IN (WIRE) ×2 IMPLANT
NEEDLE 27GAX1X1/2 (NEEDLE) ×1 IMPLANT
NS IRRIG 1000ML POUR BTL (IV SOLUTION) ×2 IMPLANT
PACK BASIN DAY SURGERY FS (CUSTOM PROCEDURE TRAY) ×2 IMPLANT
PAD CAST 3X4 CTTN HI CHSV (CAST SUPPLIES) ×1 IMPLANT
PAD CAST 4YDX4 CTTN HI CHSV (CAST SUPPLIES) IMPLANT
PADDING CAST ABS 4INX4YD NS (CAST SUPPLIES) ×1
PADDING CAST ABS COTTON 4X4 ST (CAST SUPPLIES) ×1 IMPLANT
PADDING CAST COTTON 3X4 STRL (CAST SUPPLIES) ×2
PADDING CAST COTTON 4X4 STRL (CAST SUPPLIES)
PADDING UNDERCAST 2  STERILE (CAST SUPPLIES) IMPLANT
SLEEVE SCD COMPRESS KNEE MED (MISCELLANEOUS) ×1 IMPLANT
SPLINT PLASTER CAST XFAST 3X15 (CAST SUPPLIES) IMPLANT
SPLINT PLASTER XTRA FASTSET 3X (CAST SUPPLIES) ×14
SPONGE GAUZE 4X4 12PLY (GAUZE/BANDAGES/DRESSINGS) ×2 IMPLANT
STOCKINETTE 4X48 STRL (DRAPES) ×2 IMPLANT
STRIP CLOSURE SKIN 1/2X4 (GAUZE/BANDAGES/DRESSINGS) IMPLANT
SUCTION FRAZIER TIP 10 FR DISP (SUCTIONS) IMPLANT
SUT ETHILON 4 0 PS 2 18 (SUTURE) IMPLANT
SUT MERSILENE 4 0 P 3 (SUTURE) IMPLANT
SUT PROLENE 3 0 PS 2 (SUTURE) IMPLANT
SUT PROLENE 4 0 P 3 18 (SUTURE) IMPLANT
SUT VIC AB 4-0 P-3 18XBRD (SUTURE) IMPLANT
SUT VIC AB 4-0 P3 18 (SUTURE)
SYR 3ML 23GX1 SAFETY (SYRINGE) IMPLANT
SYR BULB 3OZ (MISCELLANEOUS) IMPLANT
SYR CONTROL 10ML LL (SYRINGE) ×1 IMPLANT
TOWEL OR 17X24 6PK STRL BLUE (TOWEL DISPOSABLE) ×2 IMPLANT
TRAY DSU PREP LF (CUSTOM PROCEDURE TRAY) ×2 IMPLANT
TUBE CONNECTING 20X1/4 (TUBING) IMPLANT
UNDERPAD 30X30 INCONTINENT (UNDERPADS AND DIAPERS) ×2 IMPLANT
WATER STERILE IRR 1000ML POUR (IV SOLUTION) ×1 IMPLANT

## 2011-06-09 NOTE — Discharge Instructions (Addendum)
[facility] Instructions Hand Surgery  Wound Care: Keep your hand elevated above the level of your heart.  Do not allow it to dangle  by your side.  Keep the dressing dry and do not remove it unless your doctor advises you to do so.  He will usually change it at the time of your post-op visit.  Moving your fingers is advised to stimulate circulation but will depend on the site of your surgery.  If you have a splint applied, your doctor will advise you regarding movement.  Activity: Do not drive or operate machinery today.  Rest today and then you may return to your normal activity and work as indicated by your physician.  Diet:  Drink liquids today or eat a light diet.  You may resume a regular diet tomorrow.    General expectations: Pain for two to three days. Fingers may become slightly swollen.  Call your doctor if any of the following occur: Severe pain not relieved by pain medication. Elevated temperature. Dressing soaked with blood. Inability to move fingers. White or bluish color to fingers.  Post Anesthesia Home Care Instructions  Activity: Get plenty of rest for the remainder of the day. A responsible adult should stay with you for 24 hours following the procedure.  For the next 24 hours, DO NOT: -Drive a car -Paediatric nurse -Drink alcoholic beverages -Take any medication unless instructed by your physician -Make any legal decisions or sign important papers.  Meals: Start with liquid foods such as gelatin or soup. Progress to regular foods as tolerated. Avoid greasy, spicy, heavy foods. If nausea and/or vomiting occur, drink only clear liquids until the nausea and/or vomiting subsides. Call your physician if vomiting continues.  Special Instructions/Symptoms: Your throat may feel dry or sore from the anesthesia or the breathing tube placed in your throat during surgery. If this causes discomfort, gargle with warm salt water. The discomfort should disappear within 24  hours.    Discharge Instructions After Orthopedic Procedures:  *You may feel tired and weak following your procedure. It is recommended that you limit physical activity for the next 24 hours and rest at home for the remainder of today and tomorrow. *No strenuous activity should be started without your doctor's permission.  Elevate the extremity that you had surgery on to a level above your heart. This should continue for 48 hours or as instructed by your doctor.  If you had hand, arm or shoulder surgery you should move your fingers frequently unless otherwise instructed by your doctor.  If you had foot, knee or leg surgery you should wiggle your toes frequently unless otherwise instructed by your doctor.  Follow your doctor's exact instructions for activity at home. Use your home equipment as instructed. (Crutches, hard shoes, slings etc.)  Limit your activity as instructed by your doctor.  Report to your doctor should any of the following occur: 1. Extreme swelling of your fingers or toes. 2. Inability to wiggle your fingers or toes. 3. Coldness, pale or bluish color in your fingers or toes. 4. Loss of sensation, numbness or tingling of your fingers or toes. 5. Unusual smell or odor from under your dressing or cast. 6. Excessive bleeding or drainage from the surgical site. 7. Pain not relieved by medication your doctor has prescribed for you. 8. Cast or dressing too tight (do not get your dressing or cast wet or put anything under          your dressing or cast.)  *Do  not change your dressing unless instructed by your doctor or discharge nurse. Then follow exact instructions.  *Follow labeled instructions for any medications that your doctor may have prescribed for you. *Should any questions or complications develop following your procedure, PLEASE CONTACT YOUR DOCTOR.      Post Anesthesia Home Care Instructions  Activity: Get plenty of rest for the remainder of the day. A  responsible adult should stay with you for 24 hours following the procedure.  For the next 24 hours, DO NOT: -Drive a car -Paediatric nurse -Drink alcoholic beverages -Take any medication unless instructed by your physician -Make any legal decisions or sign important papers.  Meals: Start with liquid foods such as gelatin or soup. Progress to regular foods as tolerated. Avoid greasy, spicy, heavy foods. If nausea and/or vomiting occur, drink only clear liquids until the nausea and/or vomiting subsides. Call your physician if vomiting continues.  Special Instructions/Symptoms: Your throat may feel dry or sore from the anesthesia or the breathing tube placed in your throat during surgery. If this causes discomfort, gargle with warm salt water. The discomfort should disappear within 24 hours.

## 2011-06-09 NOTE — Transfer of Care (Signed)
Immediate Anesthesia Transfer of Care Note  Patient: Heather [name]  Procedure(s) Performed: Procedure(s) (LRB): CLOSED REDUCTION METACARPAL WITH PERCUTANEOUS PINNING (Right)  Patient Location: PACU  Anesthesia Type: General  Level of Consciousness: awake and sedated  Airway & Oxygen Therapy: Patient Spontanous Breathing and Patient connected to face mask oxygen  Post-op Assessment: Report given to PACU RN and Post -op Vital signs reviewed and stable  Post vital signs: Reviewed and stable  Complications: No apparent anesthesia complications

## 2011-06-09 NOTE — Anesthesia Preprocedure Evaluation (Signed)
Anesthesia Evaluation  Patient identified by MRN, date of birth, ID band Patient awake    Reviewed: Allergy & Precautions, H&P , NPO status , Patient's Chart, lab work & pertinent test results, reviewed documented beta blocker date and time   Airway Mallampati: II TM Distance: >3 FB Neck ROM: full    Dental   Pulmonary neg pulmonary ROS,          Cardiovascular hypertension, On Medications and On Home Beta Blockers + CAD, + Past MI, + CABG and +CHF + dysrhythmias     Neuro/Psych negative neurological ROS  negative psych ROS   GI/Hepatic Neg liver ROS, GERD-  Medicated and Controlled,  Endo/Other  negative endocrine ROS  Renal/GU negative Renal ROS  negative genitourinary   Musculoskeletal   Abdominal   Peds  Hematology negative hematology ROS (+)   Anesthesia Other Findings See surgeon's H&P   Reproductive/Obstetrics negative OB ROS                           Anesthesia Physical Anesthesia Plan  ASA: III  Anesthesia Plan: General   Post-op Pain Management:    Induction: Intravenous  Airway Management Planned: LMA  Additional Equipment:   Intra-op Plan:   Post-operative Plan: Extubation in OR  Informed Consent: I have reviewed the patients History and Physical, chart, labs and discussed the procedure including the risks, benefits and alternatives for the proposed anesthesia with the patient or authorized representative who has indicated his/her understanding and acceptance.   Dental Advisory Given  Plan Discussed with: CRNA and Surgeon  Anesthesia Plan Comments:         Anesthesia Quick Evaluation

## 2011-06-09 NOTE — Anesthesia Procedure Notes (Signed)
Procedure Name: LMA Insertion Date/Time: 06/09/2011 10:33 AM Performed by: Jessica Checketts D Pre-anesthesia Checklist: Patient identified, Emergency Drugs available, Suction available and Patient being monitored Patient Re-evaluated:Patient Re-evaluated prior to inductionOxygen Delivery Method: Circle System Utilized Preoxygenation: Pre-oxygenation with 100% oxygen Intubation Type: IV induction Ventilation: Mask ventilation without difficulty LMA: LMA inserted LMA Size: 4.0 Number of attempts: 1 Placement Confirmation: positive ETCO2 Tube secured with: Tape Dental Injury: Teeth and Oropharynx as per pre-operative assessment

## 2011-06-09 NOTE — Op Note (Signed)
[name], [name]              ACCOUNT NO.:  [id number]  MEDICAL RECORD NO.:  [id number]  LOCATION:                                 FACILITY:  PHYSICIAN:  Youlanda Mighty. Jennise Both, M.D.      DATE OF BIRTH:  DATE OF PROCEDURE:  06/09/2011 DATE OF DISCHARGE:                              OPERATIVE REPORT   PREOPERATIVE DIAGNOSIS:  Unstable proximal metaphyseal base fracture right thumb metacarpal with more than 50 degrees of angulation and loss of the thumb-index webspace.  POSTOPERATIVE DIAGNOSIS:  Unstable proximal metaphyseal base fracture right thumb metacarpal with more than 50 degrees of angulation and loss of the thumb-index webspace.  OPERATION:  Closed reduction, percutaneous pinning of proximal metaphyseal fracture, right thumb metacarpal.  OPERATING SURGEON:  Youlanda Mighty. Neri Samek, M.D.  ASSISTANT:  Julian Reil, P.A.  ANESTHESIA:  General by LMA.  SUPERVISING ANESTHESIOLOGIST:  Jessy Oto. Albertina Parr, M.D.  INDICATIONS:  Heather [name] is a 78 year old patient, well acquainted with our practice.  She is referred by her family for evaluation and management of displaced right thumb metacarpal fracture.  She had fallen and sustained significant injury to her hand.  She had marked ecchymosis and inability to abduct her thumb.  X-rays revealed a markedly displaced metaphyseal base fracture with more than 50 degrees of apex dorsal angulation.  I had a detailed informed consent with [name] and her family.  I recommended closed reduction, percutaneous pinning, to obtain and maintain reduction.  The goal of the reduction is to allow preservation of her thumb-index webspace.  They understand that this fracture will be stable in approximately 3-4 weeks.  We will need to have her protected in a splint while the fracture is healing.  We will move the wires in the office in approximately 3-4 weeks.  Questions regarding her predicament were invited and answered in  detail.  PROCEDURE:  [name] was brought to room #1 of the [facility] and placed in supine position on the operating table.  Following the induction of general anesthesia by LMA technique, the right arm was prepped with Betadine soap and solution, sterilely draped. A pneumatic tourniquet was applied over proximal right brachium.  Following a routine surgical time-out, the right hand and arm were exsanguinated with a direct compression followed by inflation of arterial tourniquet to 220 mmHg.  Procedure commenced with a 3-point reduction maneuver with slight pronation of the thumb.  This led to an anatomic reduction of the metacarpal.  With some challenges, we threaded two 0.045 inch Kirschner wires through remarkably swollen thenar eminence and thumb MP region to reach the metacarpal distally.  We threaded 1 pin intermedullary locking the MP joint in approximately 15 degrees of flexion.  We placed it in such a manner as to prevent a re- displacement of the fracture.  A second was placed obliquely to control rotation.  Both cross the fracture site adequately.  An anatomic reduction was achieved.  We subsequently bent the pins, capped them, and dressed the pin tracts with Xeroflo.  [name] was placed in a thumb spica splint with the wrist in extension and her thumb palmarly abducted to maintain proper length of  the adductor muscle.  We will see her back for followup in our office in a week.  At that time, we anticipate advancing to a cast.     Youlanda Mighty. Jonothan Heberle, M.D.     RVS/MEDQ  D:  06/09/2011  T:  06/09/2011  Job:  [id number]

## 2011-06-09 NOTE — Op Note (Signed)
[id number]  

## 2011-06-09 NOTE — Anesthesia Postprocedure Evaluation (Signed)
Anesthesia Post Note  Patient: Heather [name]  Procedure(s) Performed: Procedure(s) (LRB): CLOSED REDUCTION METACARPAL WITH PERCUTANEOUS PINNING (Right)  Anesthesia type: General  Patient location: PACU  Post pain: Pain level controlled  Post assessment: Patient's Cardiovascular Status Stable  Last Vitals:  Filed Vitals:   06/09/11 1235  BP: 150/69  Pulse: 78  Temp: 36.8 C  Resp: 16    Post vital signs: Reviewed and stable  Level of consciousness: alert  Complications: No apparent anesthesia complications

## 2011-06-09 NOTE — Brief Op Note (Signed)
06/09/2011  11:04 AM  PATIENT:  Heather [name]  78 y.o. female  PRE-OPERATIVE DIAGNOSIS:  fracture right thumb metacarpal base angulated more than 50 degrees  POST-OPERATIVE DIAGNOSIS:  fracture right thumb metacarpal, displaced and unstable  PROCEDURE:   CLOSED REDUCTION METACARPAL WITH PERCUTANEOUS PINNING (Right)  SURGEON:  Surgeon(s) and Role:    * Cammie Sickle., MD - Primary  PHYSICIAN ASSISTANT:   ASSISTANTS: \Harley Fitzwater Dasnoit,P.A-C   ANESTHESIA:   general  EBL:  Total I/O In: 400 [I.V.:400] Out: -   BLOOD ADMINISTERED:none  DRAINS: none   LOCAL MEDICATIONS USED:  XYLOCAINE   SPECIMEN:  No Specimen  DISPOSITION OF SPECIMEN:  N/A  COUNTS:  YES  TOURNIQUET:   Total Tourniquet Time Documented: Forearm (Right) - 17 minutes  DICTATION: .Other Dictation: Dictation Number [id number]  PLAN OF CARE: Discharge to home after PACU  PATIENT DISPOSITION:  PACU - hemodynamically stable.

## 2011-11-17 ENCOUNTER — Other Ambulatory Visit: Payer: Self-pay | Admitting: Family Medicine

## 2011-11-17 DIAGNOSIS — J392 Other diseases of pharynx: Secondary | ICD-10-CM

## 2011-11-20 ENCOUNTER — Ambulatory Visit
Admission: RE | Admit: 2011-11-20 | Discharge: 2011-11-20 | Disposition: A | Discharge status: Home / Self Care | Payer: Medicare Other | Source: Ambulatory Visit | Attending: Family Medicine | Admitting: Family Medicine

## 2011-11-20 DIAGNOSIS — J392 Other diseases of pharynx: Secondary | ICD-10-CM

## 2011-11-22 ENCOUNTER — Other Ambulatory Visit: Payer: Self-pay | Admitting: Family Medicine

## 2011-11-22 DIAGNOSIS — E079 Disorder of thyroid, unspecified: Secondary | ICD-10-CM

## 2011-11-23 ENCOUNTER — Other Ambulatory Visit (HOSPITAL_COMMUNITY)
Admission: RE | Admit: 2011-11-23 | Discharge: 2011-11-23 | Disposition: A | Discharge status: Home / Self Care | Payer: Medicare Other | Source: Ambulatory Visit | Attending: Interventional Radiology | Admitting: Interventional Radiology

## 2011-11-23 ENCOUNTER — Ambulatory Visit
Admission: RE | Admit: 2011-11-23 | Discharge: 2011-11-23 | Disposition: A | Discharge status: Home / Self Care | Payer: Medicare Other | Source: Ambulatory Visit | Attending: Family Medicine | Admitting: Family Medicine

## 2011-11-23 DIAGNOSIS — E079 Disorder of thyroid, unspecified: Secondary | ICD-10-CM

## 2011-11-23 DIAGNOSIS — E049 Nontoxic goiter, unspecified: Secondary | ICD-10-CM | POA: Insufficient documentation

## 2012-05-08 ENCOUNTER — Encounter: Payer: Self-pay | Admitting: Cardiology

## 2013-07-12 ENCOUNTER — Inpatient Hospital Stay (HOSPITAL_COMMUNITY)
Admission: EM | Admit: 2013-07-12 | Discharge: 2013-07-14 | DRG: 247 | Disposition: A | Discharge status: Home / Self Care | Payer: Medicare Other | Attending: Cardiology | Admitting: Cardiology

## 2013-07-12 ENCOUNTER — Emergency Department (HOSPITAL_COMMUNITY): Payer: Medicare Other

## 2013-07-12 ENCOUNTER — Encounter (HOSPITAL_COMMUNITY)
Admission: EM | Disposition: A | Discharge status: Home / Self Care | Payer: Medicare Other | Source: Home / Self Care | Attending: Cardiology

## 2013-07-12 ENCOUNTER — Encounter (HOSPITAL_COMMUNITY): Payer: Self-pay | Admitting: Emergency Medicine

## 2013-07-12 DIAGNOSIS — T7840XA Allergy, unspecified, initial encounter: Secondary | ICD-10-CM | POA: Diagnosis not present

## 2013-07-12 DIAGNOSIS — I10 Essential (primary) hypertension: Secondary | ICD-10-CM | POA: Diagnosis present

## 2013-07-12 DIAGNOSIS — Z955 Presence of coronary angioplasty implant and graft: Secondary | ICD-10-CM

## 2013-07-12 DIAGNOSIS — Z7982 Long term (current) use of aspirin: Secondary | ICD-10-CM

## 2013-07-12 DIAGNOSIS — Y9210 Unspecified residential institution as the place of occurrence of the external cause: Secondary | ICD-10-CM | POA: Diagnosis not present

## 2013-07-12 DIAGNOSIS — K219 Gastro-esophageal reflux disease without esophagitis: Secondary | ICD-10-CM | POA: Diagnosis present

## 2013-07-12 DIAGNOSIS — I2582 Chronic total occlusion of coronary artery: Secondary | ICD-10-CM | POA: Diagnosis present

## 2013-07-12 DIAGNOSIS — Z87891 Personal history of nicotine dependence: Secondary | ICD-10-CM

## 2013-07-12 DIAGNOSIS — I2119 ST elevation (STEMI) myocardial infarction involving other coronary artery of inferior wall: Principal | ICD-10-CM | POA: Diagnosis present

## 2013-07-12 DIAGNOSIS — Z888 Allergy status to other drugs, medicaments and biological substances status: Secondary | ICD-10-CM

## 2013-07-12 DIAGNOSIS — I5032 Chronic diastolic (congestive) heart failure: Secondary | ICD-10-CM | POA: Diagnosis present

## 2013-07-12 DIAGNOSIS — Z79899 Other long term (current) drug therapy: Secondary | ICD-10-CM

## 2013-07-12 DIAGNOSIS — I4891 Unspecified atrial fibrillation: Secondary | ICD-10-CM | POA: Diagnosis present

## 2013-07-12 DIAGNOSIS — I2510 Atherosclerotic heart disease of native coronary artery without angina pectoris: Secondary | ICD-10-CM | POA: Diagnosis present

## 2013-07-12 DIAGNOSIS — M1990 Unspecified osteoarthritis, unspecified site: Secondary | ICD-10-CM | POA: Diagnosis present

## 2013-07-12 DIAGNOSIS — I25810 Atherosclerosis of coronary artery bypass graft(s) without angina pectoris: Secondary | ICD-10-CM | POA: Diagnosis present

## 2013-07-12 DIAGNOSIS — Z6837 Body mass index (BMI) 37.0-37.9, adult: Secondary | ICD-10-CM

## 2013-07-12 DIAGNOSIS — Z7902 Long term (current) use of antithrombotics/antiplatelets: Secondary | ICD-10-CM

## 2013-07-12 DIAGNOSIS — I4510 Unspecified right bundle-branch block: Secondary | ICD-10-CM | POA: Diagnosis present

## 2013-07-12 DIAGNOSIS — E039 Hypothyroidism, unspecified: Secondary | ICD-10-CM | POA: Diagnosis present

## 2013-07-12 DIAGNOSIS — I252 Old myocardial infarction: Secondary | ICD-10-CM

## 2013-07-12 DIAGNOSIS — I509 Heart failure, unspecified: Secondary | ICD-10-CM | POA: Diagnosis present

## 2013-07-12 DIAGNOSIS — Z9089 Acquired absence of other organs: Secondary | ICD-10-CM

## 2013-07-12 DIAGNOSIS — E669 Obesity, unspecified: Secondary | ICD-10-CM | POA: Diagnosis present

## 2013-07-12 DIAGNOSIS — X58XXXA Exposure to other specified factors, initial encounter: Secondary | ICD-10-CM | POA: Diagnosis not present

## 2013-07-12 DIAGNOSIS — E785 Hyperlipidemia, unspecified: Secondary | ICD-10-CM | POA: Diagnosis present

## 2013-07-12 HISTORY — DX: Paroxysmal atrial fibrillation: I48.0

## 2013-07-12 HISTORY — DX: Chronic diastolic (congestive) heart failure: I50.32

## 2013-07-12 HISTORY — PX: LEFT HEART CATHETERIZATION WITH CORONARY ANGIOGRAM: SHX5451

## 2013-07-12 HISTORY — DX: Other specified postprocedural states: Z98.890

## 2013-07-12 LAB — APTT: aPTT: 35 seconds (ref 24–37)

## 2013-07-12 LAB — BASIC METABOLIC PANEL
BUN: 10 mg/dL (ref 6–23)
CO2: 22 mEq/L (ref 19–32)
Calcium: 9.4 mg/dL (ref 8.4–10.5)
Chloride: 99 mEq/L (ref 96–112)
Creatinine, Ser: 0.82 mg/dL (ref 0.50–1.10)
GFR calc Af Amer: 76 mL/min — ABNORMAL LOW (ref >90)
GFR calc non Af Amer: 65 mL/min — ABNORMAL LOW (ref >90)
Glucose, Bld: 154 mg/dL — ABNORMAL HIGH (ref 70–99)
Potassium: 4 mEq/L (ref 3.7–5.3)
Sodium: 138 mEq/L (ref 137–147)

## 2013-07-12 LAB — I-STAT CHEM 8, ED
BUN: 9 mg/dL (ref 6–23)
Calcium, Ion: 1.2 mmol/L (ref 1.13–1.30)
Chloride: 101 mEq/L (ref 96–112)
Creatinine, Ser: 0.9 mg/dL (ref 0.50–1.10)
Glucose, Bld: 153 mg/dL — ABNORMAL HIGH (ref 70–99)
HCT: 36 % (ref 36.0–46.0)
Hemoglobin: 12.2 g/dL (ref 12.0–15.0)
Potassium: 3.8 mEq/L (ref 3.7–5.3)
Sodium: 138 mEq/L (ref 137–147)
TCO2: 22 mmol/L (ref 0–100)

## 2013-07-12 LAB — CBC
HCT: 33.6 % — ABNORMAL LOW (ref 36.0–46.0)
Hemoglobin: 11.6 g/dL — ABNORMAL LOW (ref 12.0–15.0)
MCH: 30.3 pg (ref 26.0–34.0)
MCHC: 34.5 g/dL (ref 30.0–36.0)
MCV: 87.7 fL (ref 78.0–100.0)
Platelets: 179 10*3/uL (ref 150–400)
RBC: 3.83 MIL/uL — ABNORMAL LOW (ref 3.87–5.11)
RDW: 13.8 % (ref 11.5–15.5)
WBC: 7.5 10*3/uL (ref 4.0–10.5)

## 2013-07-12 LAB — TROPONIN I: Troponin I: 0.68 ng/mL (ref <0.30)

## 2013-07-12 LAB — TSH: TSH: 1.93 u[IU]/mL (ref 0.350–4.500)

## 2013-07-12 LAB — DIFFERENTIAL
Basophils Absolute: 0 10*3/uL (ref 0.0–0.1)
Basophils Relative: 1 % (ref 0–1)
Eosinophils Absolute: 0.1 10*3/uL (ref 0.0–0.7)
Eosinophils Relative: 2 % (ref 0–5)
Lymphocytes Relative: 32 % (ref 12–46)
Lymphs Abs: 1.9 10*3/uL (ref 0.7–4.0)
Monocytes Absolute: 0.5 10*3/uL (ref 0.1–1.0)
Monocytes Relative: 8 % (ref 3–12)
Neutro Abs: 3.3 10*3/uL (ref 1.7–7.7)
Neutrophils Relative %: 57 % (ref 43–77)

## 2013-07-12 LAB — I-STAT TROPONIN, ED: Troponin i, poc: 0.01 ng/mL (ref 0.00–0.08)

## 2013-07-12 LAB — PROTIME-INR
INR: 1.06 (ref 0.00–1.49)
Prothrombin Time: 13.8 seconds (ref 11.6–15.2)

## 2013-07-12 LAB — CREATININE, SERUM
Creatinine, Ser: 0.75 mg/dL (ref 0.50–1.10)
GFR calc Af Amer: 90 mL/min — ABNORMAL LOW (ref >90)
GFR calc non Af Amer: 77 mL/min — ABNORMAL LOW (ref >90)

## 2013-07-12 LAB — MRSA PCR SCREENING: MRSA by PCR: NEGATIVE

## 2013-07-12 SURGERY — LEFT HEART CATHETERIZATION WITH CORONARY ANGIOGRAM

## 2013-07-12 MED ORDER — SODIUM CHLORIDE 0.9 % IV SOLN
1.0000 mL/kg/h | INTRAVENOUS | Status: AC
  Administered 2013-07-12: 1 mL/kg/h via INTRAVENOUS

## 2013-07-12 MED ORDER — NITROGLYCERIN 0.2 MG/ML ON CALL CATH LAB
INTRAVENOUS | Status: AC
  Filled 2013-07-12: qty 1

## 2013-07-12 MED ORDER — MORPHINE SULFATE 4 MG/ML IJ SOLN
4.0000 mg | INTRAMUSCULAR | Status: AC
  Administered 2013-07-12: 4 mg via INTRAVENOUS

## 2013-07-12 MED ORDER — ACETAMINOPHEN 325 MG PO TABS: 650.0000 mg | ORAL_TABLET | ORAL | Status: DC | PRN

## 2013-07-12 MED ORDER — BIVALIRUDIN 250 MG IV SOLR
INTRAVENOUS | Status: AC
  Filled 2013-07-12: qty 250

## 2013-07-12 MED ORDER — ASPIRIN EC 325 MG PO TBEC: 325.0000 mg | DELAYED_RELEASE_TABLET | Freq: Every day | ORAL | Status: DC

## 2013-07-12 MED ORDER — ASPIRIN 81 MG PO CHEW
81.0000 mg | CHEWABLE_TABLET | Freq: Every day | ORAL | Status: DC
  Administered 2013-07-13 – 2013-07-14 (×2): 81 mg via ORAL
  Filled 2013-07-12 (×2): qty 1

## 2013-07-12 MED ORDER — HEPARIN SODIUM (PORCINE) 1000 UNIT/ML IJ SOLN
INTRAMUSCULAR | Status: AC
  Filled 2013-07-12: qty 1

## 2013-07-12 MED ORDER — LEVOTHYROXINE SODIUM 50 MCG PO TABS
50.0000 ug | ORAL_TABLET | Freq: Every day | ORAL | Status: DC
  Administered 2013-07-13 – 2013-07-14 (×2): 50 ug via ORAL
  Filled 2013-07-12 (×3): qty 1

## 2013-07-12 MED ORDER — ONDANSETRON HCL 4 MG/2ML IJ SOLN: 4.0000 mg | Freq: Four times a day (QID) | INTRAMUSCULAR | Status: DC | PRN

## 2013-07-12 MED ORDER — SODIUM CHLORIDE 0.9 % IV SOLN: 250.0000 mL | INTRAVENOUS | Status: DC | PRN

## 2013-07-12 MED ORDER — ONDANSETRON HCL 4 MG/2ML IJ SOLN
INTRAMUSCULAR | Status: AC
  Filled 2013-07-12: qty 2

## 2013-07-12 MED ORDER — ASPIRIN 81 MG PO CHEW: 324.0000 mg | CHEWABLE_TABLET | Freq: Once | ORAL | Status: DC

## 2013-07-12 MED ORDER — HEPARIN SODIUM (PORCINE) 5000 UNIT/ML IJ SOLN
5000.0000 [IU] | Freq: Three times a day (TID) | INTRAMUSCULAR | Status: DC
  Administered 2013-07-12 – 2013-07-14 (×5): 5000 [IU] via SUBCUTANEOUS
  Filled 2013-07-12 (×8): qty 1

## 2013-07-12 MED ORDER — FENTANYL CITRATE 0.05 MG/ML IJ SOLN
INTRAMUSCULAR | Status: AC
  Filled 2013-07-12: qty 2

## 2013-07-12 MED ORDER — SODIUM CHLORIDE 0.9 % IJ SOLN: 3.0000 mL | INTRAMUSCULAR | Status: DC | PRN

## 2013-07-12 MED ORDER — NITROGLYCERIN 0.4 MG SL SUBL
SUBLINGUAL_TABLET | SUBLINGUAL | Status: AC
  Filled 2013-07-12: qty 1

## 2013-07-12 MED ORDER — MIDAZOLAM HCL 2 MG/2ML IJ SOLN
INTRAMUSCULAR | Status: AC
  Filled 2013-07-12: qty 2

## 2013-07-12 MED ORDER — SODIUM CHLORIDE 0.9 % IV SOLN
INTRAVENOUS | Status: DC
  Administered 2013-07-12: 12:00:00 via INTRAVENOUS

## 2013-07-12 MED ORDER — SODIUM CHLORIDE 0.9 % IJ SOLN
3.0000 mL | Freq: Two times a day (BID) | INTRAMUSCULAR | Status: DC
  Administered 2013-07-12 – 2013-07-13 (×4): 3 mL via INTRAVENOUS

## 2013-07-12 MED ORDER — ATENOLOL 25 MG PO TABS
25.0000 mg | ORAL_TABLET | Freq: Two times a day (BID) | ORAL | Status: DC
  Administered 2013-07-12 – 2013-07-14 (×4): 25 mg via ORAL
  Filled 2013-07-12 (×5): qty 1

## 2013-07-12 MED ORDER — NITROGLYCERIN 0.4 MG SL SUBL
SUBLINGUAL_TABLET | SUBLINGUAL | Status: AC
  Administered 2013-07-12: 0.4 mg
  Filled 2013-07-12: qty 1

## 2013-07-12 MED ORDER — NITROGLYCERIN 0.4 MG SL SUBL: 0.4000 mg | SUBLINGUAL_TABLET | SUBLINGUAL | Status: DC | PRN

## 2013-07-12 MED ORDER — TICAGRELOR 90 MG PO TABS
ORAL_TABLET | ORAL | Status: AC
  Filled 2013-07-12: qty 1

## 2013-07-12 MED ORDER — FAMOTIDINE 20 MG PO TABS
20.0000 mg | ORAL_TABLET | Freq: Every day | ORAL | Status: DC
  Administered 2013-07-13 – 2013-07-14 (×2): 20 mg via ORAL
  Filled 2013-07-12 (×2): qty 1

## 2013-07-12 MED ORDER — HEPARIN SODIUM (PORCINE) 5000 UNIT/ML IJ SOLN
4000.0000 [IU] | Freq: Once | INTRAMUSCULAR | Status: AC
  Administered 2013-07-12: 4000 [IU] via INTRAVENOUS

## 2013-07-12 MED ORDER — ONDANSETRON HCL 4 MG/2ML IJ SOLN: 4.0000 mg | Freq: Once | INTRAMUSCULAR | Status: DC

## 2013-07-12 MED ORDER — LIDOCAINE HCL (PF) 1 % IJ SOLN
INTRAMUSCULAR | Status: AC
  Filled 2013-07-12: qty 30

## 2013-07-12 MED ORDER — NITROGLYCERIN IN D5W 200-5 MCG/ML-% IV SOLN
2.0000 ug/min | INTRAVENOUS | Status: DC
  Administered 2013-07-12: 5 ug/min via INTRAVENOUS

## 2013-07-12 MED ORDER — NITROGLYCERIN 0.4 MG SL SUBL: 0.4000 mg | SUBLINGUAL_TABLET | Freq: Once | SUBLINGUAL | Status: DC

## 2013-07-12 MED ORDER — TICAGRELOR 90 MG PO TABS
90.0000 mg | ORAL_TABLET | Freq: Two times a day (BID) | ORAL | Status: DC
  Administered 2013-07-12 – 2013-07-14 (×4): 90 mg via ORAL
  Filled 2013-07-12 (×6): qty 1

## 2013-07-12 MED ORDER — HEPARIN SODIUM (PORCINE) 5000 UNIT/ML IJ SOLN: 4000.0000 [IU] | INTRAMUSCULAR | Status: DC

## 2013-07-12 MED ORDER — SODIUM CHLORIDE 0.9 % IV SOLN
0.2500 mg/kg/h | INTRAVENOUS | Status: AC
  Filled 2013-07-12: qty 250

## 2013-07-12 MED ORDER — ASPIRIN 81 MG PO CHEW
CHEWABLE_TABLET | ORAL | Status: AC
  Filled 2013-07-12: qty 4

## 2013-07-12 MED ORDER — MAGNESIUM OXIDE 400 (241.3 MG) MG PO TABS
400.0000 mg | ORAL_TABLET | Freq: Every day | ORAL | Status: DC
  Administered 2013-07-13 – 2013-07-14 (×2): 400 mg via ORAL
  Filled 2013-07-12 (×2): qty 1

## 2013-07-12 MED ORDER — CALCIUM CARBONATE-VITAMIN D 500-200 MG-UNIT PO TABS
1.0000 | ORAL_TABLET | Freq: Every day | ORAL | Status: DC
  Administered 2013-07-13 – 2013-07-14 (×2): 1 via ORAL
  Filled 2013-07-12 (×2): qty 1

## 2013-07-12 MED ORDER — ATORVASTATIN CALCIUM 80 MG PO TABS
80.0000 mg | ORAL_TABLET | Freq: Every day | ORAL | Status: DC
  Administered 2013-07-12 – 2013-07-13 (×2): 80 mg via ORAL
  Filled 2013-07-12 (×3): qty 1

## 2013-07-12 MED ORDER — ACETAMINOPHEN 500 MG PO TABS: 500.0000 mg | ORAL_TABLET | Freq: Four times a day (QID) | ORAL | Status: DC | PRN

## 2013-07-12 MED ORDER — MORPHINE SULFATE 4 MG/ML IJ SOLN
INTRAMUSCULAR | Status: AC
  Filled 2013-07-12: qty 1

## 2013-07-12 MED ORDER — TRAMADOL HCL 50 MG PO TABS: 50.0000 mg | ORAL_TABLET | Freq: Four times a day (QID) | ORAL | Status: DC | PRN

## 2013-07-12 MED ORDER — NITROGLYCERIN IN D5W 200-5 MCG/ML-% IV SOLN
INTRAVENOUS | Status: AC
  Filled 2013-07-12: qty 250

## 2013-07-12 MED ORDER — HEPARIN (PORCINE) IN NACL 2-0.9 UNIT/ML-% IJ SOLN
INTRAMUSCULAR | Status: AC
  Filled 2013-07-12: qty 1500

## 2013-07-12 NOTE — ED Notes (Signed)
Pt undressed, in gown, on monitor, on zoll pads, on continuous pulse oximetry, blood pressure cuff and oxygen [place] (2L)

## 2013-07-12 NOTE — ED Notes (Signed)
Ignacia Bayley, NP with cardiology at the bedside

## 2013-07-12 NOTE — Progress Notes (Signed)
Echo Lab  2D Echocardiogram completed.  [place], [place] 07/12/2013 2:56 PM

## 2013-07-12 NOTE — ED Notes (Signed)
Per Mitchell, pt from home for sudden onset substernal cp with radiation into neck. Nausea no vomiting, diaphoresis, taken 325 mg ASA, total of 4 NTG with pain of 8/10. Bilateral 20g to RFA and LFA. No oxygen on patient.

## 2013-07-12 NOTE — ED Notes (Signed)
Dr. Jeanell Sparrow at the bedside.

## 2013-07-12 NOTE — ED Notes (Signed)
Going to cath lab room 6

## 2013-07-12 NOTE — CV Procedure (Addendum)
PROCEDURE:  Left heart catheterization with selective coronary angiography, left ventriculogram. Aspiration thrombectomy of the SVG RCA. PCI of the SVG RCA with distal embolic protection.  INDICATIONS:  Inferior STEMI  The risks, benefits, and details of the procedure were explained to the patient.  The patient verbalized understanding and wanted to proceed.  Informed written consent was obtained.  PROCEDURE TECHNIQUE:  After Xylocaine anesthesia a 12F sheath was placed in the right femoral artery with a single anterior needle wall stick.   Left coronary angiography was done using a Judkins L4 guide catheter.  Right coronary angiography was done using a Judkins R4 guide catheter.  Angiography of the 2 vein grafts were performed using the RCB guide catheter. Access to the subclavian was obtained with a LIMA catheter. We were unable to cross the lesion in the proximal subclavian with either a J-tip wire or a Versa core. Selective angiography of the subclavian was performed using hand injection of contrast through the LIMA catheter. Left ventriculography was done using a pigtail catheter.    CONTRAST:  Total of 125 cc.  COMPLICATIONS:  None.    HEMODYNAMICS:  Aortic pressure was 138/59; LV pressure was 141/10; LVEDP 18.  There was no gradient between the left ventricle and aorta.    ANGIOGRAPHIC DATA:   The left main coronary artery is mildly diseased but patent.  The left anterior descending artery is a large vessel. There is mild to moderate atherosclerosis diffusely. There is competitive flow noted in the distal LAD.  The left circumflex artery is a small vessel which is occluded in the midportion. There is a small OM1 which is patent.  The SVG to OM is patent. There is mild to moderate disease in the proximal portion of the graft.  The right coronary artery is known to be occluded from prior cath.  The SVG to RCA is occluded in the midportion. After intervention was noted that this is a  very large graft. The stent in the distal portion of the graft is widely patent. There is brisk retrograde filling of the native RCA. The posterior lateral branch is large. The posterior descending artery is medium-sized but patent.  The left subclavian artery was selectively engaged. There was a moderate lesion in the proximal vessel. The J-tip wire would not cross this lesion. The versacore wire did cross but was difficult to navigate into the subclavian artery. Since we have seen competitive flow in the native LAD, I did not aggressively try to engage the LIMA. Nonselective angiography shows that the proximal to mid LIMA is patent.  Marland Kitchen  LEFT VENTRICULOGRAM:  Left ventricular angiogram was done in the 30 RAO projection and revealed normal left ventricular wall motion and systolic function with an estimated ejection fraction of 55 %.  LVEDP was 18 mmHg.  PCI NARRATIVE: And RCB guiding catheter was used to engage the SVG to RCA. Angiomax was used for anticoagulation. A pro-water wire was placed across the occlusion in the mid vessel. A priority one aspiration catheter was advanced to the mid vessel with 2 passes. There is significant thrombus removal and restoration of TIMI-3 flow. A 5 mm spider Rx embolic protection device was advanced to the distal portion of the graft. There was a residual lesion in the mid RCA. The lesion was stented with a 3.5 x 15 resolute drug-eluting stent. The balloon was inflated to high pressure. The stent size was 3.85 mm in diameter.  Intracoronary nitroglycerin was given. TIMI-3 flow was noted  throughout the RCA system. There was an excellent angiographic result.  IMPRESSIONS:  1. Widely patent left main coronary artery. 2. Moderate disease in the proximal to mid left anterior descending artery and its branches.  Competitive flow noted in the distal LAD implying patency of the LIMA to LAD. 3. Occluded mid left circumflex artery. Patent SVG to OM, with mild to moderate  proximal disease in the graft. 4. Known occluded native right coronary artery.  Acute occlusion of the SVG to RCA the midportion. This was successfully treated with aspiration thrombectomy followed by stent placement with distal embolic protection. A 3.5 x 15 resolute stent was deployed to 3.85 mm in diameter. The patient tolerated the procedure well. 5. Normal left ventricular systolic function.  LVEDP 18 mmHg.  Ejection fraction 55%. 6.   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 Consider noninvasive evaluation with duplex to further evaluate.  RECOMMENDATION:  She'll be watched in the ICU. Continue Angiomax for 2 hours. She will need dual antiplatelet therapy for at least a year. Continue aggressive secondary prevention. It does not appear that she is on chronic anticoagulation despite her history of atrial fibrillation. If the decision is made to anticoagulate her, her antiplatelet therapy will have to be changed. She will followup with Dr. Wynonia Lawman.  Initiate statin therapy.  Unsure why she was not taking a statin at home with her h/o CAD.  Statins Not listed as an allergy.  Start atorvastatin 80 mg daily.  This potentially could be reduced in the future.

## 2013-07-12 NOTE — H&P (Signed)
Patient ID: OVERA BALDERRAMA MRN: [id number], DOB/AGE: June 19, 1930   Admit date: 07/12/2013  Primary Physician: No primary provider on file. Primary Cardiologist: S. Wynonia Lawman, MD  Pt. Profile:  80 y/o female with a h/o CAD s/p CABG x 3 in 2003, who presents with acute inferior STEMI.  Problem List  Past Medical History  Diagnosis Date  . S/P partial hysterectomy 1977  . Arthritis   . Coronary artery disease     a. s/p MI in 1980;  b. 2003 CABG x 3 (LIMA->LAD, VG->OM, VG->RPL);  c. 01/2009 Cath: 3/3 patent grafts, native 3VD, EF 60%.  . Hypothyroidism   . Hypertension   . PAF (paroxysmal atrial fibrillation)   . Chronic diastolic CHF (congestive heart failure)     a. EF 60% by LV gram 01/2009.  Marland Kitchen GERD (gastroesophageal reflux disease)   . H/O mitral valve repair     a. 2003    Past Surgical History  Procedure Laterality Date  . Appendectomy  1950  . Thyroidectomy  1968  . Abdominal hysterectomy  1977  . Cholecystectomy    . Bladder suspension      tac  . Carpal tunnel release      right and left  . Knee arthroscopy      left  . Coronary artery bypass graft  2003  . Mitral valve repair  2003    with cabg  . Thyroidectomy, partial      left  . Cardiac catheterization  [id number]     Allergies  Allergies  Allergen Reactions  . Codeine    HPI  80 y/o female with a h/o CAD s/p CABG x 3 in 2003.  Last cath in 2011 showed patent grafts with occlusive RCA and LCX dzs.  She is followed by Dr. Wynonia Lawman and is generally able to be active around her home w/o symptoms or limitations.  She was in her usoh until this AM around 9:30, when she developed severe c/p, dyspnea, and diaphoresis while talking on the phone with her dtr.  EMS was called and ECG showed marked inf ST elevation with lateral ST dep.  Pt was taken to the ED and code STEMI was activated.  She was treated with ASA, heparin, morphine, and ntg with persistent pain and ST elevation.  She has been taken to the cath lab  for emergent cath +/- PCI.  Home Medications  Prior to Admission medications   Medication Sig Start Date End Date Taking? Authorizing Provider  acetaminophen (TYLENOL) 500 MG tablet Take 500 mg by mouth every 6 (six) hours as needed.    Historical Provider, MD  aspirin 325 MG EC tablet Take 325 mg by mouth daily.    Historical Provider, MD  atenolol (TENORMIN) 25 MG tablet Take 25 mg by mouth 2 (two) times daily. Takes 1/2    Historical Provider, MD  calcium-vitamin D (OSCAL WITH D) 500-200 MG-UNIT per tablet Take 1 tablet by mouth daily.    Historical Provider, MD  levothyroxine (SYNTHROID, LEVOTHROID) 50 MCG tablet Take 50 mcg by mouth daily.    Historical Provider, MD  magnesium oxide (MAG-OX) 400 MG tablet Take 400 mg by mouth daily.    Historical Provider, MD  ranitidine (ZANTAC) 150 MG capsule Take 150 mg by mouth 1 day or 1 dose.    Historical Provider, MD  traMADol (ULTRAM) 50 MG tablet Take 50 mg by mouth every 6 (six) hours as needed.    Historical Provider, MD  Family History  Family History  Problem Relation Age of Onset  . Other      no premature CAD.    Social History  History   Social History  . Marital Status: Widowed    Spouse Name: N/A    Number of Children: N/A  . Years of Education: N/A   Occupational History  . Not on file.   Social History Main Topics  . Smoking status: Former Smoker    Quit date: 06/06/1967  . Smokeless tobacco: Not on file  . Alcohol Use: No  . Drug Use: No  . Sexual Activity:    Other Topics Concern  . Not on file   Social History Narrative   Lives in [place].  Widowed.     Review of Systems General:  +++ diaphoresis, malaise.  No chills, fever, night sweats or weight changes.  Cardiovascular:  +++ chest pain, dyspnea, no edema, orthopnea, palpitations, paroxysmal nocturnal dyspnea. Dermatological: No rash, lesions/masses Respiratory: No cough, dyspnea Urologic: No hematuria, dysuria Abdominal:   No nausea,  vomiting, diarrhea, bright red blood per rectum, melena, or hematemesis Neurologic:  No visual changes, wkns, changes in mental status. All other systems reviewed and are otherwise negative except as noted above.  Physical Exam  Blood pressure 119/57, temperature 96.1 F (35.6 C), temperature source Temporal, resp. rate 14, height 4\' 11"  (1.499 m), weight 185 lb (83.915 kg), SpO2 96.00%.  General: Pleasant, NAD Psych: Normal affect. Neuro: Alert and oriented X 3. Moves all extremities spontaneously. HEENT: Normal  Neck: Supple without bruits or JVD. Lungs:  Resp regular and unlabored, diminished breath sounds bilat. Heart: RRR no s3, s4, or murmurs. Abdomen: Soft, non-tender, non-distended, BS + x 4.  Extremities: No clubbing, cyanosis or edema. DP/PT/Radials 2+ and equal bilaterally.  Labs  Troponin [facility] Care Test)  Recent Labs  07/12/13 1019  TROPIPOC 0.01   Lab Results  Component Value Date   WBC 6.8 02/22/2008   HGB 12.2 07/12/2013   HCT 36.0 07/12/2013   MCV 88.9 02/22/2008   PLT 198 02/22/2008     Recent Labs Lab 07/12/13 1010 07/12/13 1031  NA 138 138  K 4.0 3.8  CL 99 101  CO2 22  --   BUN 10 9  CREATININE 0.82 0.90  CALCIUM 9.4  --   GLUCOSE 154* 153*   Radiology/Studies  No results found.  ECG  SB, 55, RBBB, 36mm inf ST elevation with 2-3 mm ST dep in I, aVL, V1, V2.  ASSESSMENT AND PLAN  1.  Acute inferior STEMI/CAD:  Emergent cath now-->occluded VG->RPL.  PCI ongoing.  Admit to CCU, cycle CE, check echo.  Cont asa, p2y12 inhibitor, bb (if hr/bp tolerate post pci), add statin.  Eventual cardiac rehab.  2.  HTN:  Stable on bb @ home.  3.  HL:  Check lipids/lft's.  Adding high potency statin.  4.  Hypothyroidism:  Check TSH.  Cont synthroid.  5.  H/O PAF:  In sinus.  She is not on anticoagulation @ home.  Cont bb/asa.   Signed, Murray Hodgkins, NP 07/12/2013, 11:15 AM   I have examined the patient and reviewed assessment and plan and  discussed with patient.  Agree with above as stated.   Plan emergency cath for acute inferior MI.  Suspect acute occlusion of the SVG to RCA graft.  Native RCA known to be occluded at last cath in 2011.  Patient currently medically stable but having some bradycardia. This may get worse  with revascularization. We'll have to be prepared for temporary pacer as well if necessary. Further plans based on cath result. She is not taking anticoagulate at home. She has no history of TIA or stroke.Marland Kitchen  VARANASI,JAYADEEP S.

## 2013-07-12 NOTE — ED Provider Notes (Signed)
CSN: [id number]     Arrival date & time 07/12/13  1008 History   First MD Initiated Contact with Patient 07/12/13 1022     Chief Complaint  Patient presents with  . Code STEMI     (Consider location/radiation/quality/duration/timing/severity/associated sxs/prior Treatment) HPI 80 y.o. Female with chest pain that began approximately one hour pte.  She describes it as severe substernal chest pain.  EMS did prehospital ekg which revealed st elevation and code stemi called in field.  Patient denies syncope or dyspnea.  SHe received four nitro prehospital and aspirin. She had a cabg in 2002.  She denies recent mi or intervention or testing.   Past Medical History  Diagnosis Date  . Heart attack 1980  . S/P partial hysterectomy 1977  . Arthritis   . Coronary artery disease   . Hypothyroidism   . Hypertension   . Dysrhythmia     af  . CHF (congestive heart failure)   . GERD (gastroesophageal reflux disease)    Past Surgical History  Procedure Laterality Date  . Appendectomy  1950  . Thyroidectomy  1968  . Abdominal hysterectomy  1977  . Cholecystectomy    . Bladder suspension      tac  . Carpal tunnel release      right and left  . Knee arthroscopy      left  . Coronary artery bypass graft  2003  . Mitral valve repair  2003    with cabg  . Thyroidectomy, partial      left  . Cardiac catheterization  03,05,11   No family history on file. History  Substance Use Topics  . Smoking status: Former Smoker    Quit date: 06/06/1967  . Smokeless tobacco: Not on file  . Alcohol Use: No   OB History   Grav Para Term Preterm Abortions TAB SAB Ect Mult Living                 Review of Systems  All other systems reviewed and are negative.     Allergies  Codeine  Home Medications   Prior to Admission medications   Medication Sig Start Date End Date Taking? Authorizing Provider  acetaminophen (TYLENOL) 500 MG tablet Take 500 mg by mouth every 6 (six) hours as needed.     Historical Provider, MD  aspirin 325 MG EC tablet Take 325 mg by mouth daily.    Historical Provider, MD  atenolol (TENORMIN) 25 MG tablet Take 25 mg by mouth 2 (two) times daily. Takes 1/2    Historical Provider, MD  calcium-vitamin D (OSCAL WITH D) 500-200 MG-UNIT per tablet Take 1 tablet by mouth daily.    Historical Provider, MD  levothyroxine (SYNTHROID, LEVOTHROID) 50 MCG tablet Take 50 mcg by mouth daily.    Historical Provider, MD  magnesium oxide (MAG-OX) 400 MG tablet Take 400 mg by mouth daily.    Historical Provider, MD  ranitidine (ZANTAC) 150 MG capsule Take 150 mg by mouth 1 day or 1 dose.    Historical Provider, MD  traMADol (ULTRAM) 50 MG tablet Take 50 mg by mouth every 6 (six) hours as needed.    Historical Provider, MD   BP 119/57  Temp(Src) 96.1 F (35.6 C) (Temporal)  Resp 14  Ht 4\' 11"  (1.499 m)  Wt 185 lb (83.915 kg)  BMI 37.35 kg/m2  SpO2 96% Physical Exam  Nursing note and vitals reviewed. Constitutional: She is oriented to person, place, and time. She appears well-developed and  well-nourished. She appears distressed.  HENT:  Head: Normocephalic and atraumatic.  Eyes: Conjunctivae are normal. Pupils are equal, round, and reactive to light.  Neck: Normal range of motion. Neck supple.  Cardiovascular: Normal rate, regular rhythm, normal heart sounds and intact distal pulses.   Pulmonary/Chest: Effort normal and breath sounds normal.  Abdominal: Soft.  Musculoskeletal: Normal range of motion.  Neurological: She is alert and oriented to person, place, and time. She has normal reflexes.  Skin: Skin is warm and dry.  Psychiatric: She has a normal mood and affect. Her behavior is normal. Judgment and thought content normal.    ED Course  Procedures (including critical care time) Labs Review Labs Reviewed  CBC  DIFFERENTIAL  PROTIME-INR  APTT  BASIC METABOLIC PANEL    Imaging Review No results found.   EKG Interpretation   Date/Time:  Saturday July 12 2013 10:13:54 EDT Ventricular Rate:  55 PR Interval:  217 QRS Duration: 154 QT Interval:  428 QTC Calculation: 409 R Axis:   52 Text Interpretation:  Normal sinus rhythm Right bundle branch block ST  elevation in Inferior leads ** ** ACUTE MI / STEMI ** ** Confirmed by RAY  MD, Andee Poles [id number]) on 07/12/2013 10:29:01 AM      MDM   Final diagnoses:  Acute myocardial infarction of other inferior wall, initial episode of care    Called to room with report that stemi patient here.  I received pager notification of stemi prior to patient's arrival.  [facility] reports that this was page out at [phone].  No answer in cath lab 10:29 AM Dr. Scarlette Calico called back and states they will call when team arrives.  Plan heparin, nitro, ms.   Shaune Pollack, MD 07/12/13 [phone]

## 2013-07-13 LAB — CBC
HCT: 34.6 % — ABNORMAL LOW (ref 36.0–46.0)
Hemoglobin: 11.6 g/dL — ABNORMAL LOW (ref 12.0–15.0)
MCH: 29.1 pg (ref 26.0–34.0)
MCHC: 33.5 g/dL (ref 30.0–36.0)
MCV: 86.9 fL (ref 78.0–100.0)
Platelets: 189 10*3/uL (ref 150–400)
RBC: 3.98 MIL/uL (ref 3.87–5.11)
RDW: 13.7 % (ref 11.5–15.5)
WBC: 7 10*3/uL (ref 4.0–10.5)

## 2013-07-13 LAB — CK TOTAL AND CKMB (NOT AT ARMC)
CK, MB: 5.2 ng/mL — ABNORMAL HIGH (ref 0.3–4.0)
Relative Index: INVALID (ref 0.0–2.5)
Total CK: 87 U/L (ref 7–177)

## 2013-07-13 LAB — TROPONIN I: Troponin I: 3.19 ng/mL (ref <0.30)

## 2013-07-13 LAB — COMPREHENSIVE METABOLIC PANEL
ALT: 21 U/L (ref 0–35)
AST: 32 U/L (ref 0–37)
Albumin: 3.9 g/dL (ref 3.5–5.2)
Alkaline Phosphatase: 73 U/L (ref 39–117)
BUN: 8 mg/dL (ref 6–23)
CO2: 27 mEq/L (ref 19–32)
Calcium: 9.5 mg/dL (ref 8.4–10.5)
Chloride: 99 mEq/L (ref 96–112)
Creatinine, Ser: 0.89 mg/dL (ref 0.50–1.10)
GFR calc Af Amer: 69 mL/min — ABNORMAL LOW (ref >90)
GFR calc non Af Amer: 59 mL/min — ABNORMAL LOW (ref >90)
Glucose, Bld: 117 mg/dL — ABNORMAL HIGH (ref 70–99)
Potassium: 4.5 mEq/L (ref 3.7–5.3)
Sodium: 140 mEq/L (ref 137–147)
Total Bilirubin: 0.8 mg/dL (ref 0.3–1.2)
Total Protein: 6.9 g/dL (ref 6.0–8.3)

## 2013-07-13 LAB — LIPID PANEL
Cholesterol: 234 mg/dL — ABNORMAL HIGH (ref 0–200)
HDL: 34 mg/dL — ABNORMAL LOW (ref >39)
LDL Cholesterol: UNDETERMINED mg/dL (ref 0–99)
Total CHOL/HDL Ratio: 6.9 RATIO
Triglycerides: 431 mg/dL — ABNORMAL HIGH (ref <150)
VLDL: UNDETERMINED mg/dL (ref 0–40)

## 2013-07-13 MED ORDER — ASPIRIN EC 81 MG PO TBEC: 81.0000 mg | DELAYED_RELEASE_TABLET | Freq: Every day | ORAL | Status: DC

## 2013-07-13 NOTE — Progress Notes (Signed)
Utilization Review Completed.Dowell, Deborah T6/28/2015  

## 2013-07-13 NOTE — Progress Notes (Signed)
Subjective:  Feels good no recurrent pain.  Objective:  Vital Signs in the last 24 hours: BP 134/53  Pulse 42  Temp(Src) 98.3 F (36.8 C) (Oral)  Resp 16  Ht 4\' 11"  (1.499 m)  Wt 83.915 kg (185 lb)  BMI 37.35 kg/m2  SpO2 95%  Physical Exam: Pleasant WF in NAD Lungs:  Clear Cardiac:  Regular rhythm, normal S1 and S2, no S3 Extremities:  Cath site clean and dry  Intake/Output from previous day: 06/27 0701 - 06/28 0700 In: 869.8 [P.O.:240; I.V.:629.8] Out: 2550 [Urine:2550]  Weight Filed Weights   07/12/13 1013 07/12/13 1427  Weight: 83.915 kg (185 lb) 83.915 kg (185 lb)    Lab Results: Basic Metabolic Panel:  Recent Labs  07/12/13 1010 07/12/13 1031 07/12/13 1400 07/13/13 0041  NA 138 138  --  140  K 4.0 3.8  --  4.5  CL 99 101  --  99  CO2 22  --   --  27  GLUCOSE 154* 153*  --  117*  BUN 10 9  --  8  CREATININE 0.82 0.90 0.75 0.89   CBC:  Recent Labs  07/12/13 1010  07/12/13 1400 07/13/13 0041  WBC  --   --  7.5 7.0  NEUTROABS 3.3  --   --   --   HGB  --   < > 11.6* 11.6*  HCT  --   < > 33.6* 34.6*  MCV  --   --  87.7 86.9  PLT  --   --  179 189  < > = values in this interval not displayed. Cardiac Enzymes:  Recent Labs  07/12/13 1400 07/13/13 0041  TROPONINI 0.68* 3.19*    Telemetry: sinus   Assessment/Plan:  1. Acute inferior MI 2. CAD 3. RBBB  Plan:  Move to floor progress activity. Cardiac rehab.     Kerry Hough  MD [facility] Card[facility]  07/13/2013, 11:09 AM

## 2013-07-14 DIAGNOSIS — E039 Hypothyroidism, unspecified: Secondary | ICD-10-CM | POA: Insufficient documentation

## 2013-07-14 DIAGNOSIS — I2510 Atherosclerotic heart disease of native coronary artery without angina pectoris: Secondary | ICD-10-CM

## 2013-07-14 DIAGNOSIS — I25810 Atherosclerosis of coronary artery bypass graft(s) without angina pectoris: Secondary | ICD-10-CM

## 2013-07-14 DIAGNOSIS — E785 Hyperlipidemia, unspecified: Secondary | ICD-10-CM | POA: Insufficient documentation

## 2013-07-14 DIAGNOSIS — I119 Hypertensive heart disease without heart failure: Secondary | ICD-10-CM | POA: Insufficient documentation

## 2013-07-14 DIAGNOSIS — E669 Obesity, unspecified: Secondary | ICD-10-CM | POA: Insufficient documentation

## 2013-07-14 HISTORY — DX: Hyperlipidemia, unspecified: E78.5

## 2013-07-14 HISTORY — DX: Atherosclerosis of coronary artery bypass graft(s) without angina pectoris: I25.810

## 2013-07-14 HISTORY — DX: Atherosclerotic heart disease of native coronary artery without angina pectoris: I25.10

## 2013-07-14 HISTORY — DX: Hypertensive heart disease without heart failure: I11.9

## 2013-07-14 MED ORDER — SIMVASTATIN 20 MG PO TABS: 20.0000 mg | ORAL_TABLET | Freq: Every day | ORAL | Status: DC

## 2013-07-14 MED ORDER — TICAGRELOR 90 MG PO TABS: 90.0000 mg | ORAL_TABLET | Freq: Two times a day (BID) | ORAL | Status: DC

## 2013-07-14 MED ORDER — DIPHENHYDRAMINE HCL 25 MG PO CAPS: 25.0000 mg | ORAL_CAPSULE | Freq: Once | ORAL | Status: DC

## 2013-07-14 MED ORDER — SIMVASTATIN 20 MG PO TABS
20.0000 mg | ORAL_TABLET | Freq: Every day | ORAL | Status: DC
  Filled 2013-07-14: qty 1

## 2013-07-14 MED FILL — Sodium Chloride IV Soln 0.9%: INTRAVENOUS | Qty: 50 | Status: AC

## 2013-07-14 NOTE — Discharge Summary (Signed)
Physician Discharge Summary  Patient ID: Heather [name] MRN: [id number] DOB/AGE: 04/11/1930 80 y.o.  Admit date: 07/12/2013 Discharge date: 07/14/2013  Primary Physician:  Dr. Claris Gower  Primary Discharge Diagnosis:   1. Acute myocardial infarction initial episode  Secondary Discharge Diagnosis: 2. Coronary artery disease with bypass graft disease 3. Hyperlipidemia with prior statin intolerance 4. Hypertension 5. Obesity 6. Prior mitral valve repair  Procedures:  Cardiac catheterization and stenting of an occluded vein graft to right coronary artery, 2-D echocardiogram  Hospital Course: This 80 year-old female has a history of coronary artery disease with bypass grafting and mitral valve repair 12 years ago. Her grafts were patent in 2011. She is obese and limited with osteoarthritis. She was in her usual state of health until the day of admission when she had the onset of substernal chest pain that was severe. She was brought to the hospital and found to have an acute inferior myocardial infarction. She was taken immediately to the catheterization laboratory and was found to have competitive flow in the distal LAD, occluded circumflex and occluded right coronary artery. The vein graft to the marginal branch was patent with mild/moderate proximal disease. The vein graft to the right coronary artery was occluded in its midportion. Internal mammary graft to LAD was patent. She underwent thrombectomy followed by stenting of the midportion of the right coronary artery graft by Dr. Irish Lack with placement of a 3.5 x 18 mm resolute stent. She had minimal elevation of troponin and her MB the next morning was only 5. EKG showed evolving inferior infarction. Subsequent echocardiogram showed an intact mitral valve repair and preserved LV function with only minimal inferior hypokinesis and an ejection fraction of 60%. She was ambulance for him the [place] and was seen by cardiac rehabilitation. She has  known statin intolerance to number of medications and will be tried on simvastatin on discharge. The day of discharge she had an sinus allergic reaction to some environmental substance and was given Benadryl. She is discharged in improved condition. She is to slowly increase her activities and I will see her in followup in 2 weeks.  Discharge Exam: Blood pressure 143/62, pulse 65, temperature 98 F (36.7 C), temperature source Oral, resp. rate 18, height 4\' 11"  (1.499 m), weight 83.915 kg (185 lb), SpO2 99.00%. Weight: 83.915 kg (185 lb)  Catheterization site is clean and dry, normal S1-S2 no S3, lungs clear  Labs: CBC:   Lab Results  Component Value Date   WBC 7.0 07/13/2013   HGB 11.6* 07/13/2013   HCT 34.6* 07/13/2013   MCV 86.9 07/13/2013   PLT 189 07/13/2013    CMP:  Recent Labs Lab 07/13/13 0041  NA 140  K 4.5  CL 99  CO2 27  BUN 8  CREATININE 0.89  CALCIUM 9.5  PROT 6.9  BILITOT 0.8  ALKPHOS 73  ALT 21  AST 32  GLUCOSE 117*    Lipid Panel     Component Value Date/Time   CHOL 234* 07/13/2013 0041   TRIG 431* 07/13/2013 0041   HDL 34* 07/13/2013 0041   CHOLHDL 6.9 07/13/2013 0041   VLDL UNABLE TO CALCULATE IF TRIGLYCERIDE OVER 400 mg/dL 07/13/2013 0041   LDLCALC UNABLE TO CALCULATE IF TRIGLYCERIDE OVER 400 mg/dL 07/13/2013 0041   Cardiac Enzymes:  Recent Labs  07/12/13 1400 07/13/13 0041 07/13/13 1300  CKTOTAL  --   --  87  CKMB  --   --  5.2*  TROPONINI 0.68* 3.19*  --  Thyroid: Lab Results  Component Value Date   TSH 1.930 07/12/2013   EKG: Right bundle branch block, evolving inferior infarction  Discharge Medications:   Medication List         aspirin EC 81 MG tablet  Take 81 mg by mouth daily.     atenolol 25 MG tablet  Commonly known as:  TENORMIN  Take 12.5 mg by mouth 2 (two) times daily.     calcium-vitamin D 500-200 MG-UNIT per tablet  Commonly known as:  OSCAL WITH D  Take 1 tablet by mouth daily.     Co Q 10 100 MG Caps  Take  100 mg by mouth daily.     levothyroxine 50 MCG tablet  Commonly known as:  SYNTHROID, LEVOTHROID  Take 50 mcg by mouth daily.     magnesium oxide 400 MG tablet  Commonly known as:  MAG-OX  Take 400 mg by mouth daily.     nitroGLYCERIN 0.4 MG/SPRAY spray  Commonly known as:  NITROLINGUAL  Place 1 spray under the tongue every 5 (five) minutes x 3 doses as needed for chest pain.     ranitidine 150 MG tablet  Commonly known as:  ZANTAC  Take 150 mg by mouth daily.     simvastatin 20 MG tablet  Commonly known as:  ZOCOR  Take 1 tablet (20 mg total) by mouth daily at 6 PM.     ticagrelor 90 MG Tabs tablet  Commonly known as:  BRILINTA  Take 1 tablet (90 mg total) by mouth 2 (two) times daily.       Followup plans and appointments: Follow up with Dr. Wynonia Lawman in 2 weeks  Time spent with patient to include physician time:  45 minutes  Signed: W. Doristine Church. MD [facility] 07/14/2013, 8:51 AM

## 2013-07-14 NOTE — Progress Notes (Signed)
CARDIAC REHAB PHASE I   PRE:  Rate/Rhythm: 78 Afib  BP:  Supine:   Sitting: 124/64  Standing:    SaO2:   MODE:  Ambulation: 550 ft   POST:  Rate/Rhythm: 68  BP:  Supine:   Sitting: 110/70  Standing:    SaO2: 98 RA 0900-1030 Assisted X 1 and used walker to ambulate. Gait steady with walker, pt uses cane at home to ambulate. Pt able to walk 550 feet without c/o of cp or SOB. VS stable Pt to side of bed after walk. Completed MI and stent education with pt and daughter. They voice understanding. Pt agrees to [place]. CRP in [place], will send referral.  Rodney Langton RN 07/14/2013 10:30 AM

## 2013-07-14 NOTE — Progress Notes (Signed)
Patient discharged to home.  Patient alert, oriented, verbally responsive, breathing regular and non-labored throughout, no s/s of distress noted throughout, no c/o pain throughout.  Discharge instructions verbalized to daughter and son at bedside.  Both verbalized understanding throughout.  Patient left unit in wheelchair accompanied by Eugene Gavia.  VS WNL.  Dirk Dress 07/14/2013 11:53 AM

## 2013-07-14 NOTE — Care Management Note (Signed)
    Page 1 of 1   07/14/2013     10:28:32 AM CARE MANAGEMENT NOTE 07/14/2013  Patient:  Heather [name], Heather [name]   Account Number:  [id number]  Date Initiated:  07/14/2013  Documentation initiated by:  GRAVES-BIGELOW,BRENDA  Subjective/Objective Assessment:   Pt admitted for [facility]. Plan for d/c today with brilinta.     Action/Plan:   Benefits check in progress. Pt has 30 day free card and will need Rx for 30 day free no refills. CM did call [facility] in [place] and medication is available and co pay will be 220.00. No further needs from CM at this time.   Anticipated DC Date:  07/14/2013   Anticipated DC Plan:  [place]  CM consult  Medication Assistance      Choice offered to / List presented to:             Status of service:  Completed, signed off Medicare Important Message given?  YES (If response is "NO", the following Medicare IM given date fields will be blank) Date Medicare IM given:  07/14/2013 Date Additional Medicare IM given:    Discharge Disposition:  HOME/SELF CARE  Per UR Regulation:  Reviewed for med. necessity/level of care/duration of stay  If discussed at [place] of Stay Meetings, dates discussed:    Comments:

## 2013-07-15 LAB — POCT ACTIVATED CLOTTING TIME: Activated Clotting Time: 343 seconds

## 2013-07-16 ENCOUNTER — Encounter (HOSPITAL_COMMUNITY): Payer: Self-pay | Admitting: Emergency Medicine

## 2013-07-16 ENCOUNTER — Observation Stay (HOSPITAL_COMMUNITY)
Admission: EM | Admit: 2013-07-16 | Discharge: 2013-07-17 | Disposition: A | Discharge status: Home / Self Care | Payer: Medicare Other | Attending: Cardiology | Admitting: Cardiology

## 2013-07-16 ENCOUNTER — Emergency Department (HOSPITAL_COMMUNITY): Payer: Medicare Other

## 2013-07-16 ENCOUNTER — Observation Stay (HOSPITAL_COMMUNITY): Payer: Medicare Other

## 2013-07-16 DIAGNOSIS — I252 Old myocardial infarction: Secondary | ICD-10-CM | POA: Insufficient documentation

## 2013-07-16 DIAGNOSIS — E039 Hypothyroidism, unspecified: Secondary | ICD-10-CM | POA: Insufficient documentation

## 2013-07-16 DIAGNOSIS — R0602 Shortness of breath: Principal | ICD-10-CM | POA: Insufficient documentation

## 2013-07-16 DIAGNOSIS — Z87891 Personal history of nicotine dependence: Secondary | ICD-10-CM | POA: Insufficient documentation

## 2013-07-16 DIAGNOSIS — Z7982 Long term (current) use of aspirin: Secondary | ICD-10-CM | POA: Insufficient documentation

## 2013-07-16 DIAGNOSIS — R5381 Other malaise: Secondary | ICD-10-CM | POA: Insufficient documentation

## 2013-07-16 DIAGNOSIS — I10 Essential (primary) hypertension: Secondary | ICD-10-CM | POA: Insufficient documentation

## 2013-07-16 DIAGNOSIS — Z79899 Other long term (current) drug therapy: Secondary | ICD-10-CM | POA: Insufficient documentation

## 2013-07-16 DIAGNOSIS — I2510 Atherosclerotic heart disease of native coronary artery without angina pectoris: Secondary | ICD-10-CM | POA: Insufficient documentation

## 2013-07-16 DIAGNOSIS — Z9889 Other specified postprocedural states: Secondary | ICD-10-CM | POA: Insufficient documentation

## 2013-07-16 DIAGNOSIS — K219 Gastro-esophageal reflux disease without esophagitis: Secondary | ICD-10-CM | POA: Insufficient documentation

## 2013-07-16 DIAGNOSIS — I4891 Unspecified atrial fibrillation: Secondary | ICD-10-CM | POA: Insufficient documentation

## 2013-07-16 DIAGNOSIS — R0600 Dyspnea, unspecified: Secondary | ICD-10-CM | POA: Diagnosis present

## 2013-07-16 DIAGNOSIS — R609 Edema, unspecified: Secondary | ICD-10-CM | POA: Insufficient documentation

## 2013-07-16 DIAGNOSIS — I5032 Chronic diastolic (congestive) heart failure: Secondary | ICD-10-CM | POA: Insufficient documentation

## 2013-07-16 DIAGNOSIS — R5383 Other fatigue: Secondary | ICD-10-CM

## 2013-07-16 DIAGNOSIS — I509 Heart failure, unspecified: Secondary | ICD-10-CM | POA: Insufficient documentation

## 2013-07-16 DIAGNOSIS — Z951 Presence of aortocoronary bypass graft: Secondary | ICD-10-CM | POA: Insufficient documentation

## 2013-07-16 DIAGNOSIS — M129 Arthropathy, unspecified: Secondary | ICD-10-CM | POA: Insufficient documentation

## 2013-07-16 LAB — CBC
HCT: 33.6 % — ABNORMAL LOW (ref 36.0–46.0)
Hemoglobin: 11.4 g/dL — ABNORMAL LOW (ref 12.0–15.0)
MCH: 29.2 pg (ref 26.0–34.0)
MCHC: 33.9 g/dL (ref 30.0–36.0)
MCV: 86.2 fL (ref 78.0–100.0)
Platelets: 189 10*3/uL (ref 150–400)
RBC: 3.9 MIL/uL (ref 3.87–5.11)
RDW: 13.9 % (ref 11.5–15.5)
WBC: 8.1 10*3/uL (ref 4.0–10.5)

## 2013-07-16 LAB — D-DIMER, QUANTITATIVE: D-Dimer, Quant: 3.1 ug/mL-FEU — ABNORMAL HIGH (ref 0.00–0.48)

## 2013-07-16 LAB — CBC WITH DIFFERENTIAL/PLATELET
Basophils Absolute: 0 10*3/uL (ref 0.0–0.1)
Basophils Relative: 0 % (ref 0–1)
Eosinophils Absolute: 0.2 10*3/uL (ref 0.0–0.7)
Eosinophils Relative: 1 % (ref 0–5)
HCT: 34.5 % — ABNORMAL LOW (ref 36.0–46.0)
Hemoglobin: 11.6 g/dL — ABNORMAL LOW (ref 12.0–15.0)
Lymphocytes Relative: 11 % — ABNORMAL LOW (ref 12–46)
Lymphs Abs: 1.3 10*3/uL (ref 0.7–4.0)
MCH: 28.8 pg (ref 26.0–34.0)
MCHC: 33.6 g/dL (ref 30.0–36.0)
MCV: 85.6 fL (ref 78.0–100.0)
Monocytes Absolute: 0.9 10*3/uL (ref 0.1–1.0)
Monocytes Relative: 8 % (ref 3–12)
Neutro Abs: 9 10*3/uL — ABNORMAL HIGH (ref 1.7–7.7)
Neutrophils Relative %: 80 % — ABNORMAL HIGH (ref 43–77)
Platelets: 209 10*3/uL (ref 150–400)
RBC: 4.03 MIL/uL (ref 3.87–5.11)
RDW: 14 % (ref 11.5–15.5)
WBC: 11.4 10*3/uL — ABNORMAL HIGH (ref 4.0–10.5)

## 2013-07-16 LAB — BASIC METABOLIC PANEL
BUN: 11 mg/dL (ref 6–23)
CO2: 20 mEq/L (ref 19–32)
Calcium: 9.7 mg/dL (ref 8.4–10.5)
Chloride: 95 mEq/L — ABNORMAL LOW (ref 96–112)
Creatinine, Ser: 0.91 mg/dL (ref 0.50–1.10)
GFR calc Af Amer: 67 mL/min — ABNORMAL LOW (ref >90)
GFR calc non Af Amer: 58 mL/min — ABNORMAL LOW (ref >90)
Glucose, Bld: 139 mg/dL — ABNORMAL HIGH (ref 70–99)
Potassium: 3.9 mEq/L (ref 3.7–5.3)
Sodium: 133 mEq/L — ABNORMAL LOW (ref 137–147)

## 2013-07-16 LAB — CREATININE, SERUM
Creatinine, Ser: 0.88 mg/dL (ref 0.50–1.10)
GFR calc Af Amer: 70 mL/min — ABNORMAL LOW (ref >90)
GFR calc non Af Amer: 60 mL/min — ABNORMAL LOW (ref >90)

## 2013-07-16 LAB — PRO B NATRIURETIC PEPTIDE: Pro B Natriuretic peptide (BNP): 296 pg/mL (ref 0–450)

## 2013-07-16 LAB — TROPONIN I: Troponin I: <0.3 ng/mL (ref <0.30)

## 2013-07-16 MED ORDER — ONDANSETRON HCL 4 MG/2ML IJ SOLN: 4.0000 mg | Freq: Four times a day (QID) | INTRAMUSCULAR | Status: DC | PRN

## 2013-07-16 MED ORDER — ACETAMINOPHEN 325 MG PO TABS: 650.0000 mg | ORAL_TABLET | ORAL | Status: DC | PRN

## 2013-07-16 MED ORDER — NITROGLYCERIN 0.4 MG/SPRAY TL SOLN
1.0000 | Status: DC | PRN
  Filled 2013-07-16: qty 4.9

## 2013-07-16 MED ORDER — SODIUM CHLORIDE 0.9 % IV SOLN: 250.0000 mL | INTRAVENOUS | Status: DC | PRN

## 2013-07-16 MED ORDER — SODIUM CHLORIDE 0.9 % IJ SOLN: 3.0000 mL | INTRAMUSCULAR | Status: DC | PRN

## 2013-07-16 MED ORDER — CO Q 10 100 MG PO CAPS: 100.0000 mg | ORAL_CAPSULE | Freq: Every day | ORAL | Status: DC

## 2013-07-16 MED ORDER — SODIUM CHLORIDE 0.9 % IJ SOLN
3.0000 mL | Freq: Two times a day (BID) | INTRAMUSCULAR | Status: DC
  Administered 2013-07-16 – 2013-07-17 (×3): 3 mL via INTRAVENOUS

## 2013-07-16 MED ORDER — CALCIUM CARBONATE-VITAMIN D 500-200 MG-UNIT PO TABS
1.0000 | ORAL_TABLET | Freq: Every day | ORAL | Status: DC
  Administered 2013-07-16 – 2013-07-17 (×2): 1 via ORAL
  Filled 2013-07-16 (×2): qty 1

## 2013-07-16 MED ORDER — ENOXAPARIN SODIUM 40 MG/0.4ML ~~LOC~~ SOLN
40.0000 mg | SUBCUTANEOUS | Status: DC
  Administered 2013-07-16: 40 mg via SUBCUTANEOUS
  Filled 2013-07-16 (×2): qty 0.4

## 2013-07-16 MED ORDER — CLOPIDOGREL BISULFATE 75 MG PO TABS
75.0000 mg | ORAL_TABLET | Freq: Every day | ORAL | Status: DC
  Administered 2013-07-16 – 2013-07-17 (×2): 75 mg via ORAL
  Filled 2013-07-16 (×2): qty 1

## 2013-07-16 MED ORDER — ASPIRIN EC 81 MG PO TBEC
81.0000 mg | DELAYED_RELEASE_TABLET | Freq: Every day | ORAL | Status: DC
  Administered 2013-07-16 – 2013-07-17 (×2): 81 mg via ORAL
  Filled 2013-07-16 (×2): qty 1

## 2013-07-16 MED ORDER — NITROGLYCERIN 0.4 MG SL SUBL: 0.4000 mg | SUBLINGUAL_TABLET | SUBLINGUAL | Status: DC | PRN

## 2013-07-16 MED ORDER — IOHEXOL 350 MG/ML SOLN
100.0000 mL | Freq: Once | INTRAVENOUS | Status: AC | PRN
  Administered 2013-07-16: 100 mL via INTRAVENOUS

## 2013-07-16 MED ORDER — LEVOTHYROXINE SODIUM 50 MCG PO TABS
50.0000 ug | ORAL_TABLET | Freq: Every day | ORAL | Status: DC
  Administered 2013-07-16 – 2013-07-17 (×2): 50 ug via ORAL
  Filled 2013-07-16 (×4): qty 1

## 2013-07-16 MED ORDER — ATENOLOL 12.5 MG HALF TABLET
12.5000 mg | ORAL_TABLET | Freq: Two times a day (BID) | ORAL | Status: DC
  Administered 2013-07-16 – 2013-07-17 (×3): 12.5 mg via ORAL
  Filled 2013-07-16 (×4): qty 1

## 2013-07-16 MED ORDER — ZOLPIDEM TARTRATE 5 MG PO TABS
5.0000 mg | ORAL_TABLET | Freq: Every day | ORAL | Status: DC
  Filled 2013-07-16: qty 1

## 2013-07-16 MED ORDER — FAMOTIDINE 20 MG PO TABS
20.0000 mg | ORAL_TABLET | Freq: Every day | ORAL | Status: DC
  Administered 2013-07-16 – 2013-07-17 (×2): 20 mg via ORAL
  Filled 2013-07-16 (×2): qty 1

## 2013-07-16 NOTE — ED Notes (Signed)
Cardiology at bedside.

## 2013-07-16 NOTE — Progress Notes (Signed)
Pts D-dimer came back at 3.10. Paged Dr. Wynonia Lawman orders for CTA given. Called IV team for 20 gauge IV placement.

## 2013-07-16 NOTE — Progress Notes (Signed)
CARDIAC REHAB PHASE I   PRE:  Rate/Rhythm: 74 Afib  BP:  Supine: 130/60  Sitting:   Standing:    SaO2: 991L 97 RA  MODE:  Ambulation: 550 ft   POST:  Rate/Rhythm: 76  BP:  Supine:   Sitting: 150/70  Standing:    SaO2: 100 RA 1320-1400 On arrival pt in bed on O2 1L, sar 99%. O2 discontinued sat on room air 97%. Assisted X 1 and used walker to ambulate. Gait steady with walker. Pt walked on room air. No c/o of SOB and pain with walking. Room air sat after walk 100%. Pt to recliner after walk, left her off O2. Call light in reach. Rodney Langton RN 07/16/2013 1:56 PM

## 2013-07-16 NOTE — Progress Notes (Signed)
PHARMACIST - PHYSICIAN ORDER COMMUNICATION  CONCERNING: P&T Medication Policy on Herbal Medications  DESCRIPTION:  This patient's order for:  CoQ 10  has been noted.  This product(s) is classified as an "herbal" or natural product. Due to a lack of definitive safety studies or FDA approval, nonstandard manufacturing practices, plus the potential risk of unknown drug-drug interactions while on inpatient medications, the Pharmacy and Therapeutics Committee does not permit the use of "herbal" or natural products of this type within [facility].   ACTION TAKEN: The pharmacy department is unable to verify this order at this time and your patient has been informed of this safety policy. Please reevaluate patient's clinical condition at discharge and address if the herbal or natural product(s) should be resumed at that time.  Thanks,  Nena Jordan, PharmD, BCPS 07/16/2013, 10:38 AM

## 2013-07-16 NOTE — ED Provider Notes (Signed)
CSN: [id number]     Arrival date & time 07/16/13  0334 History   First MD Initiated Contact with Patient 07/16/13 [phone]     Chief Complaint  Patient presents with  . Shortness of Breath  . Weakness     (Consider location/radiation/quality/duration/timing/severity/associated sxs/prior Treatment) HPI  This is an 80 year old female with history of hypertension, atrial fibrillation, congestive heart failure, and recent MI with stent placement 3 days ago who presents with shortness of breath. Patient states that every time she goes to lay down and sleep she "can't catch her breath." She denies any shortness of breath right now but states that it is recurrent and progressive. She also reports "tinges of chest pain" occasionally but denies chest pain at this time. She denies any cough or fever. Chest pain is improved with leaning forward. Patient also reports bodyaches. She feels that it is a result of her statin. She was just placed on Brilinta and simvastatin.  Past Medical History  Diagnosis Date  . S/P partial hysterectomy 1977  . Arthritis   . Coronary artery disease     a. s/p MI in 1980;  b. 2003 CABG x 3 (LIMA->LAD, VG->OM, VG->RPL);  c. 01/2009 Cath: 3/3 patent grafts, native 3VD, EF 60%.  . Hypothyroidism   . Hypertension   . PAF (paroxysmal atrial fibrillation)   . Chronic diastolic CHF (congestive heart failure)     a. EF 60% by LV gram 01/2009.  Marland Kitchen GERD (gastroesophageal reflux disease)   . H/O mitral valve repair     a. 2003   Past Surgical History  Procedure Laterality Date  . Appendectomy  1950  . Thyroidectomy  1968  . Abdominal hysterectomy  1977  . Cholecystectomy    . Bladder suspension      tac  . Carpal tunnel release      right and left  . Knee arthroscopy      left  . Coronary artery bypass graft  2003  . Mitral valve repair  2003    with cabg  . Thyroidectomy, partial      left  . Cardiac catheterization  03,05,11   Family History  Problem Relation Age  of Onset  . Other      no premature CAD.   History  Substance Use Topics  . Smoking status: Former Smoker    Quit date: 06/06/1967  . Smokeless tobacco: Former Systems developer  . Alcohol Use: No   OB History   Grav Para Term Preterm Abortions TAB SAB Ect Mult Living                 Review of Systems  Constitutional: Negative for fever.  Respiratory: Positive for shortness of breath. Negative for cough and chest tightness.   Cardiovascular: Negative for chest pain and leg swelling.  Gastrointestinal: Negative for nausea, vomiting and abdominal pain.  Genitourinary: Negative for dysuria.  Musculoskeletal: Negative for back pain.  All other systems reviewed and are negative.     Allergies  Atorvastatin; Codeine; Crestor; and Pravastatin  Home Medications   Prior to Admission medications   Medication Sig Start Date End Date Taking? Authorizing Provider  aspirin EC 81 MG tablet Take 81 mg by mouth daily.   Yes Historical Provider, MD  atenolol (TENORMIN) 25 MG tablet Take 12.5 mg by mouth 2 (two) times daily.    Yes Historical Provider, MD  calcium-vitamin D (OSCAL WITH D) 500-200 MG-UNIT per tablet Take 1 tablet by mouth daily.   Yes  Historical Provider, MD  Coenzyme Q10 (CO Q 10) 100 MG CAPS Take 100 mg by mouth daily.   Yes Historical Provider, MD  levothyroxine (SYNTHROID, LEVOTHROID) 50 MCG tablet Take 50 mcg by mouth daily.   Yes Historical Provider, MD  magnesium oxide (MAG-OX) 400 MG tablet Take 400 mg by mouth daily.   Yes Historical Provider, MD  nitroGLYCERIN (NITROLINGUAL) 0.4 MG/SPRAY spray Place 1 spray under the tongue every 5 (five) minutes x 3 doses as needed for chest pain.   Yes Historical Provider, MD  ranitidine (ZANTAC) 150 MG tablet Take 150 mg by mouth daily.   Yes Historical Provider, MD  simvastatin (ZOCOR) 20 MG tablet Take 20 mg by mouth daily.   Yes Historical Provider, MD  ticagrelor (BRILINTA) 90 MG TABS tablet Take 90 mg by mouth 2 (two) times daily.   Yes  Historical Provider, MD   BP 128/56  Pulse 77  Temp(Src) 97.4 F (36.3 C) (Oral)  Resp 18  Ht 4\' 11"  (1.499 m)  Wt 180 lb (81.647 kg)  BMI 36.34 kg/m2  SpO2 100% Physical Exam  Nursing note and vitals reviewed. Constitutional: She is oriented to person, place, and time. No distress.  Elderly, no acute distress  HENT:  Head: Normocephalic and atraumatic.  Eyes: Pupils are equal, round, and reactive to light.  Neck: Neck supple. No JVD present.  Cardiovascular: Normal rate, regular rhythm and normal heart sounds.   No murmur heard. Pulmonary/Chest: Effort normal and breath sounds normal. No respiratory distress. She has no wheezes. She has no rales. She exhibits tenderness.  Abdominal: Soft. Bowel sounds are normal. There is no tenderness. There is no rebound.  Musculoskeletal: She exhibits edema.  Trace bilateral lower extremity edema  Neurological: She is alert and oriented to person, place, and time.  Skin: Skin is warm and dry.  Psychiatric: She has a normal mood and affect.    ED Course  Procedures (including critical care time) Labs Review Labs Reviewed  CBC WITH DIFFERENTIAL - Abnormal; Notable for the following:    WBC 11.4 (*)    Hemoglobin 11.6 (*)    HCT 34.5 (*)    Neutrophils Relative % 80 (*)    Neutro Abs 9.0 (*)    Lymphocytes Relative 11 (*)    All other components within normal limits  BASIC METABOLIC PANEL - Abnormal; Notable for the following:    Sodium 133 (*)    Chloride 95 (*)    Glucose, Bld 139 (*)    GFR calc non Af Amer 58 (*)    GFR calc Af Amer 67 (*)    All other components within normal limits  CBC - Abnormal; Notable for the following:    Hemoglobin 11.4 (*)    HCT 33.6 (*)    All other components within normal limits  CREATININE, SERUM - Abnormal; Notable for the following:    GFR calc non Af Amer 60 (*)    GFR calc Af Amer 70 (*)    All other components within normal limits  D-DIMER, QUANTITATIVE - Abnormal; Notable for the  following:    D-Dimer, Quant 3.10 (*)    All other components within normal limits  PRO B NATRIURETIC PEPTIDE  TROPONIN I    Imaging Review Dg Chest 2 View  07/16/2013   CLINICAL DATA:  Shortness of breath and weakness  EXAM: CHEST  2 VIEW  COMPARISON:  01/18/2009  FINDINGS: There is chronic cardiomegaly. The patient has undergone CABG. There is  a new coronary stent overlapping the apex. Unchanged aortic tortuosity.  There is no edema, consolidation, effusion, or pneumothorax. Exaggerated thoracic kyphosis with advanced mid thoracic degenerative disc disease. Cholecystectomy changes.  IMPRESSION: No active cardiopulmonary disease.   Electronically Signed   By: Jorje Guild M.D.   On: 07/16/2013 04:44     EKG Interpretation   Date/Time:  Wednesday July 16 2013 03:43:45 EDT Ventricular Rate:  73 PR Interval:  220 QRS Duration: 150 QT Interval:  438 QTC Calculation: 483 R Axis:   -17 Text Interpretation:  Sinus rhythm Atrial premature complex Prolonged PR  interval Right bundle branch block Inferior infarct, age indeterminate  similar to prior Confirmed by HORTON  MD, [place] ([id number]) on 07/16/2013  3:46:33 AM      MDM   Final diagnoses:  SOB (shortness of breath)    Workup reassuring.  Dr. Wynonia Lawman to evaluate in ER.    Merryl Hacker, MD 07/16/13 [phone]

## 2013-07-16 NOTE — ED Notes (Addendum)
Pt states she is been getting very SOB and weak since Monday night, some chest pain that she describe as stabbing, pt denies any dizziness at this time. Pt states she is been having like 12 episodes of loose stool denies abd pain. No fever, nausea or vomiting.

## 2013-07-16 NOTE — H&P (Signed)
History and Physical   Admit date: 07/16/2013 Name:  Heather [name] Medical record number: [id number] DOB/Age:  1931/01/16  80 y.o. female  Referring Physician:   [facility]  Chief complaint/reason for admission: Shortness of breath and can't sleep  HPI:  This 80 year old female had an acute inferior infarction this past Saturday and had a stent placed to the graft the right coronary artery. She was doing well and was discharged on Monday. The day of discharge she complained of nasal congestion that she attributed to unusual sensitivity to environmental substances. She did not have significant dyspnea. She however called me that evening after she was discharged complaining of dyspnea after having read the brochure that goes along with the [facility] that she was given. I have advised her to return to the emergency room then but she had refused but for the past 2 nights has had to sleep sitting and would complain of shortness of breath at rest when she would lie down. She had no trouble walking around the did not have dyspnea when she would walk around. Addition she complained of severe cramps involving her legs that she attributed to the cholesterol medication. She has felt poorly came to the emergency room early this morning. BNP was only minimally elevated and her troponin was not significantly elevated. She has not had any recurrent chest discomfort. She has a known mitral valve repair and had an echocardiogram done this weekend the showed an intact repair and only minimal inferior wall hypokinesis.  Her major complaint is inability to sleep due to her dyspnea as well as leg cramps. She states that when she tries to sleep she will suddenly wake up and feel short of breath.   Past Medical History  Diagnosis Date  . S/P partial hysterectomy 1977  . Arthritis   . Coronary artery disease     a. s/p MI in 1980;  b. 2003 CABG x 3 (LIMA->LAD, VG->OM, VG->RPL);  c. 01/2009 Cath: 3/3 patent  grafts, native 3VD, EF 60%.  . Hypothyroidism   . Hypertension   . PAF (paroxysmal atrial fibrillation)   . Chronic diastolic CHF (congestive heart failure)     a. EF 60% by LV gram 01/2009.  Marland Kitchen GERD (gastroesophageal reflux disease)   . H/O mitral valve repair     a. 2003       Past Surgical History  Procedure Laterality Date  . Appendectomy  1950  . Thyroidectomy  1968  . Abdominal hysterectomy  1977  . Cholecystectomy    . Bladder suspension      tac  . Carpal tunnel release      right and left  . Knee arthroscopy      left  . Coronary artery bypass graft  2003  . Mitral valve repair  2003    with cabg  . Thyroidectomy, partial      left  . Cardiac catheterization  03,05,11  .  Allergies: is allergic to atorvastatin; codeine; crestor; and pravastatin.   Medications: Prior to Admission medications   Medication Sig Start Date End Date Taking? Authorizing Provider  aspirin EC 81 MG tablet Take 81 mg by mouth daily.   Yes Historical Provider, MD  atenolol (TENORMIN) 25 MG tablet Take 12.5 mg by mouth 2 (two) times daily.    Yes Historical Provider, MD  calcium-vitamin D (OSCAL WITH D) 500-200 MG-UNIT per tablet Take 1 tablet by mouth daily.   Yes Historical Provider, MD  Coenzyme Q10 (CO  Q 10) 100 MG CAPS Take 100 mg by mouth daily.   Yes Historical Provider, MD  levothyroxine (SYNTHROID, LEVOTHROID) 50 MCG tablet Take 50 mcg by mouth daily.   Yes Historical Provider, MD  magnesium oxide (MAG-OX) 400 MG tablet Take 400 mg by mouth daily.   Yes Historical Provider, MD  nitroGLYCERIN (NITROLINGUAL) 0.4 MG/SPRAY spray Place 1 spray under the tongue every 5 (five) minutes x 3 doses as needed for chest pain.   Yes Historical Provider, MD  ranitidine (ZANTAC) 150 MG tablet Take 150 mg by mouth daily.   Yes Historical Provider, MD  simvastatin (ZOCOR) 20 MG tablet Take 20 mg by mouth daily.   Yes Historical Provider, MD  ticagrelor (BRILINTA) 90 MG TABS tablet Take 90 mg by mouth  2 (two) times daily.   Yes Historical Provider, MD    Family History:  Family Status  Relation Status Death Age  . Mother Alive     Good Health  . Father Deceased 38    Heart Attack  . Sister Deceased     TB  . Sister Deceased     Ruptured Appendix    Social History:   reports that she quit smoking about 46 years ago. She has quit using smokeless tobacco. She reports that she does not drink alcohol or use illicit drugs.   History   Social History Narrative   Lives in [place].  Widowed.     Review of Systems: Other than as noted above, the remainder of the review of systems is normal  Physical Exam: BP 121/58  Pulse 76  Temp(Src) 98 F (36.7 C) (Oral)  Resp 18  Ht 4\' 11"  (1.499 m)  Wt 81.647 kg (180 lb)  BMI 36.34 kg/m2  SpO2 99%  General appearance: Elderly female sitting up at the bedside in no acute distress not acutely short of breath Head: Normocephalic, without obvious abnormality, atraumatic Eyes: conjunctivae/corneas clear. PERRL, EOM's intact. Fundi not examined  Neck: no adenopathy, no carotid bruit, no JVD and supple, symmetrical, trachea midline Lungs: clear to auscultation bilaterally Heart: regular rate and rhythm, S1, S2 normal, no murmur, click, rub or gallop Abdomen: soft, non-tender; bowel sounds normal; no masses,  no organomegaly Pelvic: deferred Extremities: extremities normal, atraumatic, no cyanosis or edema Pulses: 2+ and symmetric Neurologic: Grossly normal  Labs: CBC  Recent Labs  07/16/13 0430  WBC 11.4*  RBC 4.03  HGB 11.6*  HCT 34.5*  PLT 209  MCV 85.6  MCH 28.8  MCHC 33.6  RDW 14.0  LYMPHSABS 1.3  MONOABS 0.9  EOSABS 0.2  BASOSABS 0.0   CMP   Recent Labs  07/16/13 0430  NA 133*  K 3.9  CL 95*  CO2 20  GLUCOSE 139*  BUN 11  CREATININE 0.91  CALCIUM 9.7  GFRNONAA 58*  GFRAA 67*   BNP (last 3 results)  Recent Labs  07/16/13 0430  PROBNP 296.0   Cardiac Panel (last 3 results)  Recent Labs   07/13/13 1300 07/16/13 0430  CKTOTAL 87  --   CKMB 5.2*  --   TROPONINI  --  <0.30  RELINDX RELATIVE INDEX IS INVALID  --    Thyroid  Lab Results  Component Value Date   TSH 1.930 07/12/2013    EKG: Evolving inferior infarction  Radiology: Clear lungs, no CHF   IMPRESSIONS:  1. Unexplained dyspnea which is positional-she has not had any chest pain and one would wonder whether this is due to [place] or possibly  to anxiety. BNP is only minimally elevated. 2. Leg cramps attributed to simvastatin 3. Recent inferior wall myocardial infarction 4. Obesity  PLAN: She has had disabling dyspnea as well as insomnia and leg cramps. She states she has not slept in 3 days. I'm going to have her in for observation and check a repeat echocardiogram to be sure she has not had a mechanical complication of an infarction although have a low suspicion of this. Discontinue brought in then changed to Plavix. Discontinue simvastatin.  Signed: Kerry Hough MD [facility]  07/16/2013, 8:59 AM

## 2013-07-17 MED ORDER — CLOPIDOGREL BISULFATE 75 MG PO TABS: 75.0000 mg | ORAL_TABLET | Freq: Every day | ORAL | Status: AC

## 2013-07-17 NOTE — Discharge Summary (Signed)
Physician Discharge Summary  Patient ID: Heather [name] MRN: [id number] DOB/AGE: 1930-09-08 80 y.o.  Admit date: 07/16/2013 Discharge date: 07/17/2013  Primary Physician:  Dr. Claris Gower  Primary Discharge Diagnosis:   1. Dyspnea unexplained possibly do to [facility]  Secondary Discharge Diagnosis: 2. Recent acute inferior myocardial infarction clinically stable 3. Obesity 4. Hypertension 5. Hyperlipidemia with statin intolerance 6. Prior mitral valve repair  Procedures:  CTA of chest  Hospital Course: This 80 year old female had an acute inferior infarction this past Saturday and had a stent placed to the graft the right coronary artery. She was doing well and was discharged on Monday. The day of discharge she complained of nasal congestion that she attributed to unusual sensitivity to environmental substances. She did not have significant dyspnea. She however called me that evening after she was discharged complaining of dyspnea after having read the brochure that goes along with the [facility] that she was given. I have advised her to return to the emergency room then but she had refused but for the past 2 nights has had to sleep sitting and would complain of shortness of breath at rest when she would lie down. She had no trouble walking around the did not have dyspnea when she would walk around. Addition she complained of severe cramps involving her legs that she attributed to the cholesterol medication. She has felt poorly came to the emergency room early this morning. BNP was only minimally elevated and her troponin was not significantly elevated. She has not had any recurrent chest discomfort. She has a known mitral valve repair and had an echocardiogram done this weekend the showed an intact repair and only minimal inferior wall hypokinesis. Her major complaint is inability to sleep due to her dyspnea as well as leg cramps. She states that when she tries to sleep she will  suddenly wake up and feel short of breath.  The patient was admitted with severe dyspnea and inability to sleep. Initial BNP was mildly elevated but her lungs were clear she did not have clinical heart failure. Troponin was negative. I stopped her bland thinking this may have been the cause of her dyspnea. She was changed to Plavix. She also complained of severe leg cramps and her statin was stopped. She previously had been intolerant to a number of other statins. A d-dimer was drawn and returned elevated. She had a CT angiogram did not show evidence of pulmonary embolus. She had some cardiomegaly as well as thyromegaly and an aberrant right arch. She slept quite well the evening after she was admitted not having any shortness of breath orthopnea and was ambulatory in the hall without shortness of breath. Oxygen saturation is normal. At this time it is felt that her dyspnea may be a combination of Brilanta well as potential is some anxiety. She is discharged at home in improved condition and we will not have her on a statin at this point in time. She is instructed to follow up with me in one week and to call if she has recurrent problems.  Discharge Exam: Blood pressure 143/61, pulse 65, temperature 97.7 F (36.5 C), temperature source Oral, resp. rate 18, height 4\' 11"  (1.499 m), weight 82.5 kg (181 lb 14.1 oz), SpO2 96.00%. Weight: 82.5 kg (181 lb 14.1 oz) Lungs clear on discharge, telemetry shows sinus rhythm  Labs: CBC:   Lab Results  Component Value Date   WBC 8.1 07/16/2013   HGB 11.4* 07/16/2013   HCT 33.6* 07/16/2013  MCV 86.2 07/16/2013   PLT 189 07/16/2013    CMP:  Recent Labs Lab 07/13/13 0041 07/16/13 0430 07/16/13 1335  NA 140 133*  --   K 4.5 3.9  --   CL 99 95*  --   CO2 27 20  --   BUN 8 11  --   CREATININE 0.89 0.91 0.88  CALCIUM 9.5 9.7  --   PROT 6.9  --   --   BILITOT 0.8  --   --   ALKPHOS 73  --   --   ALT 21  --   --   AST 32  --   --   GLUCOSE 117* 139*  --      Lipid Panel     Component Value Date/Time   CHOL 234* 07/13/2013 0041   TRIG 431* 07/13/2013 0041   HDL 34* 07/13/2013 0041   CHOLHDL 6.9 07/13/2013 0041   VLDL UNABLE TO CALCULATE IF TRIGLYCERIDE OVER 400 mg/dL 07/13/2013 0041   LDLCALC UNABLE TO CALCULATE IF TRIGLYCERIDE OVER 400 mg/dL 07/13/2013 0041    Cardiac Enzymes:  Recent Labs  07/16/13 0430  TROPONINI <0.30    BNP (last 3 results)  Recent Labs  07/16/13 0430  PROBNP 296.0    Protime: No components found with this basename: PT,   Thyroid: Lab Results  Component Value Date   TSH 1.930 07/12/2013    Radiology: Chest x-ray shows cardiomegaly with no active cardiopulmonary disease, clear lung fields CTA of chest shows cardiomegaly with 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  EKG: Evolving inferior infarction, right bundle branch block, nonspecific ST changes  Discharge Medications:   Medication List    STOP taking these medications       simvastatin 20 MG tablet  Commonly known as:  ZOCOR     ticagrelor 90 MG Tabs tablet  Commonly known as:  BRILINTA      TAKE these medications       aspirin EC 81 MG tablet  Take 81 mg by mouth daily.     atenolol 25 MG tablet  Commonly known as:  TENORMIN  Take 12.5 mg by mouth 2 (two) times daily.     calcium-vitamin D 500-200 MG-UNIT per tablet  Commonly known as:  OSCAL WITH D  Take 1 tablet by mouth daily.     clopidogrel 75 MG tablet  Commonly known as:  PLAVIX  Take 1 tablet (75 mg total) by mouth daily.     Co Q 10 100 MG Caps  Take 100 mg by mouth daily.     levothyroxine 50 MCG tablet  Commonly known as:  SYNTHROID, LEVOTHROID  Take 50 mcg by mouth daily.     magnesium oxide 400 MG tablet  Commonly known as:  MAG-OX  Take 400 mg by mouth  daily.     nitroGLYCERIN 0.4 MG/SPRAY spray  Commonly known as:  NITROLINGUAL  Place 1 spray under the tongue every 5 (five) minutes x 3 doses as needed for chest pain.     ranitidine 150 MG tablet  Commonly known as:  ZANTAC  Take 150 mg by mouth daily.       Followup plans and appointments: Dr. Wynonia Lawman in one week  Time spent with patient to include physician time: 30 minutes  Signed: W. Doristine Church. MD [facility]  07/17/2013, 9:27 AM

## 2013-07-17 NOTE — Progress Notes (Signed)
UR Completed.  [name], Heather Jane [phone] 07/17/2013  

## 2013-07-17 NOTE — Progress Notes (Signed)
  Echocardiogram 2D Echocardiogram has been performed.  [name], Heather FRANCES 07/17/2013, 10:35 AM

## 2013-10-03 ENCOUNTER — Other Ambulatory Visit: Payer: Self-pay | Admitting: Family Medicine

## 2013-10-03 DIAGNOSIS — R1900 Intra-abdominal and pelvic swelling, mass and lump, unspecified site: Secondary | ICD-10-CM

## 2013-10-07 ENCOUNTER — Ambulatory Visit
Admission: RE | Admit: 2013-10-07 | Discharge: 2013-10-07 | Disposition: A | Discharge status: Home / Self Care | Payer: Medicare Other | Source: Ambulatory Visit | Attending: Family Medicine | Admitting: Family Medicine

## 2013-10-07 DIAGNOSIS — R1900 Intra-abdominal and pelvic swelling, mass and lump, unspecified site: Secondary | ICD-10-CM

## 2013-10-09 ENCOUNTER — Other Ambulatory Visit: Payer: Self-pay | Admitting: Family Medicine

## 2013-10-09 DIAGNOSIS — R1900 Intra-abdominal and pelvic swelling, mass and lump, unspecified site: Secondary | ICD-10-CM

## 2013-10-17 ENCOUNTER — Ambulatory Visit
Admission: RE | Admit: 2013-10-17 | Discharge: 2013-10-17 | Disposition: A | Discharge status: Home / Self Care | Payer: Medicare Other | Source: Ambulatory Visit | Attending: Family Medicine | Admitting: Family Medicine

## 2013-10-17 DIAGNOSIS — R1900 Intra-abdominal and pelvic swelling, mass and lump, unspecified site: Secondary | ICD-10-CM

## 2013-10-17 MED ORDER — GADOBENATE DIMEGLUMINE 529 MG/ML IV SOLN
15.0000 mL | Freq: Once | INTRAVENOUS | Status: AC | PRN
  Administered 2013-10-17: 15 mL via INTRAVENOUS

## 2013-10-20 ENCOUNTER — Other Ambulatory Visit: Payer: Medicare Other

## 2013-12-25 ENCOUNTER — Encounter (HOSPITAL_COMMUNITY): Payer: Self-pay | Admitting: Interventional Cardiology

## 2014-07-19 ENCOUNTER — Encounter (HOSPITAL_COMMUNITY): Payer: Self-pay | Admitting: *Deleted

## 2014-07-19 ENCOUNTER — Observation Stay (HOSPITAL_COMMUNITY)
Admission: EM | Admit: 2014-07-19 | Discharge: 2014-07-20 | Disposition: A | Discharge status: Home / Self Care | Payer: Medicare Other | Attending: Internal Medicine | Admitting: Internal Medicine

## 2014-07-19 ENCOUNTER — Inpatient Hospital Stay (HOSPITAL_COMMUNITY): Payer: Medicare Other

## 2014-07-19 ENCOUNTER — Emergency Department (HOSPITAL_COMMUNITY): Payer: Medicare Other

## 2014-07-19 DIAGNOSIS — R7989 Other specified abnormal findings of blood chemistry: Secondary | ICD-10-CM | POA: Diagnosis present

## 2014-07-19 DIAGNOSIS — Z79899 Other long term (current) drug therapy: Secondary | ICD-10-CM | POA: Insufficient documentation

## 2014-07-19 DIAGNOSIS — K219 Gastro-esophageal reflux disease without esophagitis: Secondary | ICD-10-CM | POA: Insufficient documentation

## 2014-07-19 DIAGNOSIS — R778 Other specified abnormalities of plasma proteins: Secondary | ICD-10-CM | POA: Diagnosis present

## 2014-07-19 DIAGNOSIS — Z951 Presence of aortocoronary bypass graft: Secondary | ICD-10-CM | POA: Diagnosis not present

## 2014-07-19 DIAGNOSIS — Z7902 Long term (current) use of antithrombotics/antiplatelets: Secondary | ICD-10-CM | POA: Insufficient documentation

## 2014-07-19 DIAGNOSIS — I2510 Atherosclerotic heart disease of native coronary artery without angina pectoris: Secondary | ICD-10-CM | POA: Insufficient documentation

## 2014-07-19 DIAGNOSIS — I214 Non-ST elevation (NSTEMI) myocardial infarction: Secondary | ICD-10-CM | POA: Diagnosis not present

## 2014-07-19 DIAGNOSIS — Z90711 Acquired absence of uterus with remaining cervical stump: Secondary | ICD-10-CM | POA: Diagnosis not present

## 2014-07-19 DIAGNOSIS — E039 Hypothyroidism, unspecified: Secondary | ICD-10-CM | POA: Diagnosis not present

## 2014-07-19 DIAGNOSIS — I10 Essential (primary) hypertension: Secondary | ICD-10-CM | POA: Diagnosis present

## 2014-07-19 DIAGNOSIS — M1990 Unspecified osteoarthritis, unspecified site: Secondary | ICD-10-CM | POA: Diagnosis not present

## 2014-07-19 DIAGNOSIS — E785 Hyperlipidemia, unspecified: Secondary | ICD-10-CM | POA: Diagnosis present

## 2014-07-19 DIAGNOSIS — Z7982 Long term (current) use of aspirin: Secondary | ICD-10-CM | POA: Diagnosis not present

## 2014-07-19 DIAGNOSIS — I25810 Atherosclerosis of coronary artery bypass graft(s) without angina pectoris: Secondary | ICD-10-CM | POA: Diagnosis present

## 2014-07-19 DIAGNOSIS — Z9889 Other specified postprocedural states: Secondary | ICD-10-CM | POA: Insufficient documentation

## 2014-07-19 DIAGNOSIS — I480 Paroxysmal atrial fibrillation: Secondary | ICD-10-CM | POA: Insufficient documentation

## 2014-07-19 DIAGNOSIS — Z87891 Personal history of nicotine dependence: Secondary | ICD-10-CM | POA: Insufficient documentation

## 2014-07-19 DIAGNOSIS — R42 Dizziness and giddiness: Principal | ICD-10-CM

## 2014-07-19 DIAGNOSIS — I5032 Chronic diastolic (congestive) heart failure: Secondary | ICD-10-CM | POA: Diagnosis not present

## 2014-07-19 HISTORY — DX: Atherosclerotic heart disease of native coronary artery without angina pectoris: I25.10

## 2014-07-19 HISTORY — DX: Hyperlipidemia, unspecified: E78.5

## 2014-07-19 HISTORY — DX: Essential (primary) hypertension: I10

## 2014-07-19 LAB — COMPREHENSIVE METABOLIC PANEL
ALT: 14 U/L (ref 14–54)
AST: 19 U/L (ref 15–41)
Albumin: 4 g/dL (ref 3.5–5.0)
Alkaline Phosphatase: 65 U/L (ref 38–126)
Anion gap: 8 (ref 5–15)
BUN: 13 mg/dL (ref 6–20)
CO2: 26 mmol/L (ref 22–32)
Calcium: 9.5 mg/dL (ref 8.9–10.3)
Chloride: 102 mmol/L (ref 101–111)
Creatinine, Ser: 0.86 mg/dL (ref 0.44–1.00)
GFR calc Af Amer: >60 mL/min (ref >60)
GFR calc non Af Amer: >60 mL/min (ref >60)
Glucose, Bld: 126 mg/dL — ABNORMAL HIGH (ref 65–99)
Potassium: 4.3 mmol/L (ref 3.5–5.1)
Sodium: 136 mmol/L (ref 135–145)
Total Bilirubin: 0.7 mg/dL (ref 0.3–1.2)
Total Protein: 6.7 g/dL (ref 6.5–8.1)

## 2014-07-19 LAB — CBC WITH DIFFERENTIAL/PLATELET
Basophils Absolute: 0 10*3/uL (ref 0.0–0.1)
Basophils Relative: 1 % (ref 0–1)
Eosinophils Absolute: 0.1 10*3/uL (ref 0.0–0.7)
Eosinophils Relative: 2 % (ref 0–5)
HCT: 34 % — ABNORMAL LOW (ref 36.0–46.0)
Hemoglobin: 11.6 g/dL — ABNORMAL LOW (ref 12.0–15.0)
Lymphocytes Relative: 15 % (ref 12–46)
Lymphs Abs: 1 10*3/uL (ref 0.7–4.0)
MCH: 28.7 pg (ref 26.0–34.0)
MCHC: 34.1 g/dL (ref 30.0–36.0)
MCV: 84.2 fL (ref 78.0–100.0)
Monocytes Absolute: 0.4 10*3/uL (ref 0.1–1.0)
Monocytes Relative: 7 % (ref 3–12)
Neutro Abs: 5 10*3/uL (ref 1.7–7.7)
Neutrophils Relative %: 75 % (ref 43–77)
Platelets: 175 10*3/uL (ref 150–400)
RBC: 4.04 MIL/uL (ref 3.87–5.11)
RDW: 13.5 % (ref 11.5–15.5)
WBC: 6.5 10*3/uL (ref 4.0–10.5)

## 2014-07-19 LAB — URINALYSIS, ROUTINE W REFLEX MICROSCOPIC
Bilirubin Urine: NEGATIVE
Glucose, UA: NEGATIVE mg/dL
Ketones, ur: NEGATIVE mg/dL
Leukocytes, UA: NEGATIVE
Nitrite: NEGATIVE
Protein, ur: NEGATIVE mg/dL
Specific Gravity, Urine: 1.014 (ref 1.005–1.030)
Urobilinogen, UA: 0.2 mg/dL (ref 0.0–1.0)
pH: 5.5 (ref 5.0–8.0)

## 2014-07-19 LAB — URINE MICROSCOPIC-ADD ON

## 2014-07-19 LAB — CBG MONITORING, ED: Glucose-Capillary: 122 mg/dL — ABNORMAL HIGH (ref 65–99)

## 2014-07-19 LAB — I-STAT TROPONIN, ED: Troponin i, poc: 1.37 ng/mL (ref 0.00–0.08)

## 2014-07-19 MED ORDER — ONDANSETRON HCL 4 MG/2ML IJ SOLN: 4.0000 mg | Freq: Four times a day (QID) | INTRAMUSCULAR | Status: DC | PRN

## 2014-07-19 MED ORDER — ACETAMINOPHEN 325 MG PO TABS
650.0000 mg | ORAL_TABLET | Freq: Four times a day (QID) | ORAL | Status: DC | PRN
  Administered 2014-07-20: 650 mg via ORAL
  Filled 2014-07-19: qty 2

## 2014-07-19 MED ORDER — ASPIRIN EC 81 MG PO TBEC
81.0000 mg | DELAYED_RELEASE_TABLET | Freq: Every day | ORAL | Status: DC
  Administered 2014-07-20: 81 mg via ORAL
  Filled 2014-07-19: qty 1

## 2014-07-19 MED ORDER — ENOXAPARIN SODIUM 40 MG/0.4ML ~~LOC~~ SOLN
40.0000 mg | SUBCUTANEOUS | Status: DC
  Filled 2014-07-19: qty 0.4

## 2014-07-19 MED ORDER — FAMOTIDINE 20 MG PO TABS
20.0000 mg | ORAL_TABLET | Freq: Two times a day (BID) | ORAL | Status: DC
  Administered 2014-07-20: 20 mg via ORAL
  Filled 2014-07-19: qty 1

## 2014-07-19 MED ORDER — ACETAMINOPHEN 650 MG RE SUPP: 650.0000 mg | Freq: Four times a day (QID) | RECTAL | Status: DC | PRN

## 2014-07-19 MED ORDER — ADULT MULTIVITAMIN W/MINERALS CH
1.0000 | ORAL_TABLET | ORAL | Status: DC | PRN
Start: 2014-07-19 — End: 2014-07-20

## 2014-07-19 MED ORDER — NITROGLYCERIN 0.4 MG/SPRAY TL SOLN: 1.0000 | Status: DC | PRN

## 2014-07-19 MED ORDER — LEVOTHYROXINE SODIUM 50 MCG PO TABS
50.0000 ug | ORAL_TABLET | Freq: Every day | ORAL | Status: DC
  Administered 2014-07-20: 50 ug via ORAL
  Filled 2014-07-19: qty 1

## 2014-07-19 MED ORDER — SODIUM CHLORIDE 0.9 % IV SOLN: INTRAVENOUS | Status: DC

## 2014-07-19 MED ORDER — ONDANSETRON HCL 4 MG PO TABS: 4.0000 mg | ORAL_TABLET | Freq: Four times a day (QID) | ORAL | Status: DC | PRN

## 2014-07-19 MED ORDER — ACETAMINOPHEN 500 MG PO TABS: 500.0000 mg | ORAL_TABLET | Freq: Four times a day (QID) | ORAL | Status: DC | PRN

## 2014-07-19 MED ORDER — HYDRALAZINE HCL 20 MG/ML IJ SOLN: 5.0000 mg | INTRAMUSCULAR | Status: DC | PRN

## 2014-07-19 MED ORDER — CO Q 10 100 MG PO CAPS: 100.0000 mg | ORAL_CAPSULE | Freq: Every day | ORAL | Status: DC

## 2014-07-19 MED ORDER — CLOPIDOGREL BISULFATE 75 MG PO TABS
75.0000 mg | ORAL_TABLET | Freq: Every day | ORAL | Status: DC
  Administered 2014-07-20: 75 mg via ORAL
  Filled 2014-07-19: qty 1

## 2014-07-19 MED ORDER — MAGNESIUM OXIDE 400 (241.3 MG) MG PO TABS
400.0000 mg | ORAL_TABLET | Freq: Every day | ORAL | Status: DC
  Administered 2014-07-20: 400 mg via ORAL
  Filled 2014-07-19: qty 1

## 2014-07-19 NOTE — ED Notes (Signed)
Pt was very wobbly upon standing. Stated that this is abnormal for her. She was also shaking and stated that she was very weak all over.

## 2014-07-19 NOTE — H&P (Signed)
[facility] History and Physical  Heather [name] [id number] DOB: Oct 17, 1930 DOA: 07/19/2014  Referring physician: Mr.Bowie. PCP: Leonard Downing, MD  Specialists: [facility].  Chief Complaint: Dizziness.  HPI: Heather [name] is a 81 y.o. female with history of CAD status post CABG and stenting last year for inferior wall MI, hypothyroidism, hypertension presents to the ER because of sudden onset of dizziness and diaphoresis. Patient states that she had a pizza around 3 PM after attending the church. Patient went for a nap and at around 4:30 PM patient woke up with severe dizziness headache and diaphoresis. Patient's headache was frontal. There was no focal deficits. Patient felt everything spinning around. Patient also became flushed in the face and diaphoretic. The whole symptom lasted for around 45 minutes and patient's family persuaded patient to come to the ER. In the ER CT head and MRI brain where showing nothing acute. Patient's point-of-care troponin was positive. On-call cardiologist Dr. Wynonia Lawman was consulted and patient has been admitted for further management of dizziness. Patient denies any chest pain or shortness of breath. Patient's headache is largely improved at this time. Patient denies any nausea vomiting abdominal pain or diarrhea. While monitoring the telemetry patient has had episodes of junctional rhythm with bradycardia.  Review of Systems: As presented in the history of presenting illness, rest negative.  Past Medical History  Diagnosis Date  . S/P partial hysterectomy 1977  . Arthritis   . Coronary artery disease     a. s/p MI in 1980;  b. 2003 CABG x 3 (LIMA->LAD, VG->OM, VG->RPL);  c. 01/2009 Cath: 3/3 patent grafts, native 3VD, EF 60%.  . Hypothyroidism   . Hypertension   . PAF (paroxysmal atrial fibrillation)   . Chronic diastolic CHF (congestive heart failure)     a. EF 60% by LV gram 01/2009.  Marland Kitchen GERD (gastroesophageal reflux disease)    . H/O mitral valve repair     a. 2003  . CAD (coronary artery disease), native coronary artery 07/14/2013    Catheterization August/2003 showing severe three-vessel disease and moderate mitral regurgitation 08/28/2001 CABG with internal mammary graft to LAD, vein graft to circumflex, and vein graft to posterolateral branch and mitral valve repair by Dr. 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 Patent internal mammary graft with competitive flow in the distal vessel, occluded right coronary artery, occluded circumflex, patent saphenous vein graft to circumflex with mild/moderate disease proximally, minimal LV damage   . Hyperlipidemia 07/14/2013    Intolerance to multiple statins including Crestor, pravastatin, and Lipitor    Past Surgical History  Procedure Laterality Date  . Appendectomy  1950  . Thyroidectomy  1968  . Abdominal hysterectomy  1977  . Cholecystectomy    . Bladder suspension      tac  . Carpal tunnel release      right and left  . Knee arthroscopy      left  . Coronary artery bypass graft  2003  . Mitral valve repair  2003    with cabg  . Thyroidectomy, partial      left  . Cardiac catheterization  03,05,11  . Left heart catheterization with coronary angiogram N/A 07/12/2013    Procedure: LEFT HEART CATHETERIZATION WITH CORONARY ANGIOGRAM;  Surgeon: Jettie Booze, MD;  Location: [facility] CATH LAB;  Service: Cardiovascular;  Laterality: N/A;   Social History:  reports that she quit smoking about 47 years ago.  She has quit using smokeless tobacco. She reports that she does not drink alcohol or use illicit drugs. Where does patient live home. Can patient participate in ADLs? Yes.  Allergies  Allergen Reactions  . Atorvastatin     Muscle ache  . Codeine Nausea And Vomiting  . Crestor [Rosuvastatin]     Muscle ache  . Pravastatin     Muscle ache  . Simvastatin     Muscle  cramps   . Ticagrelor     Dyspnea     Family History:  Family History  Problem Relation Age of Onset  . Other      no premature CAD.      Prior to Admission medications   Medication Sig Start Date End Date Taking? Authorizing Provider  acetaminophen (TYLENOL) 500 MG tablet Take 500 mg by mouth every 6 (six) hours as needed for moderate pain.   Yes Historical Provider, MD  aspirin EC 81 MG tablet Take 81 mg by mouth daily.   Yes Historical Provider, MD  atenolol (TENORMIN) 25 MG tablet Take 12.5 mg by mouth 2 (two) times daily.    Yes Historical Provider, MD  calcium-vitamin D (OSCAL WITH D) 500-200 MG-UNIT per tablet Take 1 tablet by mouth daily.   Yes Historical Provider, MD  clopidogrel (PLAVIX) 75 MG tablet Take 1 tablet (75 mg total) by mouth daily. 07/17/13  Yes Jacolyn Reedy, MD  Coenzyme Q10 (CO Q 10) 100 MG CAPS Take 100 mg by mouth daily.   Yes Historical Provider, MD  levothyroxine (SYNTHROID, LEVOTHROID) 50 MCG tablet Take 50 mcg by mouth daily.   Yes Historical Provider, MD  magnesium oxide (MAG-OX) 400 MG tablet Take 400 mg by mouth daily.   Yes Historical Provider, MD  Multiple Vitamin (MULTIVITAMIN WITH MINERALS) TABS tablet Take 1 tablet by mouth as needed (TAKES SOMETIMES).   Yes Historical Provider, MD  nitroGLYCERIN (NITROLINGUAL) 0.4 MG/SPRAY spray Place 1 spray under the tongue every 5 (five) minutes x 3 doses as needed for chest pain.   Yes Historical Provider, MD  Polyethyl Glycol-Propyl Glycol (SYSTANE) 0.4-0.3 % SOLN Apply 1 drop to eye daily as needed (FOR DRY EYES).   Yes Historical Provider, MD  ranitidine (ZANTAC) 150 MG tablet Take 150 mg by mouth See admin instructions. MOSTLY TAKES ONCE DAILY, SOMETIMES TWICE DAILY AS NEEDED   Yes Historical Provider, MD    Physical Exam: Filed Vitals:   07/19/14 2030 07/19/14 2100 07/19/14 2209 07/19/14 2222  BP: 167/71 145/69 155/67 179/67  Pulse: 62 56 62 60  Temp:   98.1 F (36.7 C) 98.1 F (36.7 C)  TempSrc:    Oral Oral  Resp: 20 22 20 20   Height:    4\' 11"  (1.499 m)  SpO2: 95% 95% 96% 94%     General:  Moderately built and nourished.  Eyes: Anicteric no pallor.  ENT: No discharge from the ears eyes nose or mouth.  Neck: No mass felt.  Cardiovascular: S1 and S2 heard.  Respiratory: No rhonchi or crepitations.  Abdomen: Soft nontender bowel sounds present.  Skin: No rash.  Musculoskeletal: No edema.  Psychiatric: Appears normal.  Neurologic: Alert awake oriented to time place and person. Moves all extremities 5 x 5. No facial asymmetry. Tongue is midline.  Labs on Admission:  Basic Metabolic Panel:  Recent Labs Lab 07/19/14 1827  NA 136  K 4.3  CL 102  CO2 26  GLUCOSE 126*  BUN 13  CREATININE 0.86  CALCIUM 9.5  Liver Function Tests:  Recent Labs Lab 07/19/14 1827  AST 19  ALT 14  ALKPHOS 65  BILITOT 0.7  PROT 6.7  ALBUMIN 4.0   No results for input(s): LIPASE, AMYLASE in the last 168 hours. No results for input(s): AMMONIA in the last 168 hours. CBC:  Recent Labs Lab 07/19/14 1827  WBC 6.5  NEUTROABS 5.0  HGB 11.6*  HCT 34.0*  MCV 84.2  PLT 175   Cardiac Enzymes: No results for input(s): CKTOTAL, CKMB, CKMBINDEX, TROPONINI in the last 168 hours.  BNP (last 3 results) No results for input(s): BNP in the last 8760 hours.  ProBNP (last 3 results) No results for input(s): PROBNP in the last 8760 hours.  CBG:  Recent Labs Lab 07/19/14 1837  GLUCAP 122*    Radiological Exams on Admission: Ct Head Wo Contrast  07/19/2014   CLINICAL DATA:  New onset of dizziness.  Weakness.  EXAM: CT HEAD WITHOUT CONTRAST  TECHNIQUE: Contiguous axial images were obtained from the base of the skull through the vertex without intravenous contrast.  COMPARISON:  None.  FINDINGS: No acute cortical infarct, hemorrhage, or mass lesion is present. The ventricles are of normal size. No significant extra-axial fluid collection is evident. The paranasal sinuses and  mastoid air cells are clear. The calvarium is intact. The extracranial soft tissues are within normal limits. Bilateral lens extractions are noted. The globes and orbits are otherwise intact.  IMPRESSION: Negative CT of the head.   Electronically Signed   By: San Morelle M.D.   On: 07/19/2014 19:34   Mr Brain Wo Contrast  07/19/2014   CLINICAL DATA:  Acute onset of dizziness.  EXAM: MRI HEAD WITHOUT CONTRAST  TECHNIQUE: Multiplanar, multiecho pulse sequences of the brain and surrounding structures were obtained without intravenous contrast.  COMPARISON:  CT head from the same day.  FINDINGS: Minimal periventricular T2 changes are within normal limits for age. No acute infarct, hemorrhage, or mass lesion is present. The ventricles are of normal size. No significant extra-axial fluid collection is present.  Flow is present in the major intracranial arteries. Bilateral lens replacements are noted. Globes orbits are otherwise intact. The paranasal sinuses and mastoid air cells are clear.  IMPRESSION: Normal MRI of the brain for age.   Electronically Signed   By: San Morelle M.D.   On: 07/19/2014 21:55   Dg Chest Portable 1 View  07/19/2014   CLINICAL DATA:  Acute onset of dizziness which began 0430 hr earlier today. Current history of hypertension. Prior CABG. Prior episodes of CHF.  EXAM: PORTABLE CHEST - 1 VIEW  COMPARISON:  07/16/2013 and earlier, including CTA chest 07/16/2013.  FINDINGS: Prior sternotomy for CABG. Cardiac silhouette markedly enlarged, unchanged. Thoracic aorta tortuous and atherosclerotic, unchanged. Hilar and mediastinal contours otherwise unremarkable. Mild pulmonary venous hypertension without overt edema. Lungs clear. No pleural effusions. No pneumothorax.  IMPRESSION: Stable cardiomegaly.  No acute cardiopulmonary disease.   Electronically Signed   By: Evangeline Dakin M.D.   On: 07/19/2014 19:39    EKG: Independently reviewed. Normal sinus rhythm with  RBBB.  Assessment/Plan Principal Problem:   Dizziness Active Problems:   CAD (coronary artery disease), native coronary artery   Hyperlipidemia   Hypothyroidism   H/O mitral valve repair   Elevated troponin   Hypertension, uncontrolled   1. Dizziness - patient's symptoms at this time have largely resolved and MRI brain is showing nothing acute. Appreciate cardiology consult. Patient does have periods of junctional rhythm with bradycardia. At  this time cardiologist as recommended to discontinue at no wall and check orthostatics. Closely observe in telemetry and further recommendations per cardiology. Since patient complained of headache we will check sedimentation rate. Get physical therapy consult. 2. Elevated troponin with history of CAD status post stenting and prior CABG - point-of-care troponins were elevated and at this time we will cycle cardiac markers as recommended by cardiologist. Patient is on antiplatelets agents. 3. Hypothyroidism - on Synthroid. Check TSH. 4. History of mitral valve repair.  I have reviewed patient's old charts and personally reviewed. His chest x-ray. Discussed with cardiologist Dr. Wynonia Lawman.   DVT Prophylaxis Lovenox.  Code Status: Full code.  Family Communication: Discussed patient's family at the bedside.  Disposition Plan: Admit for observation.    KAKRAKANDY,ARSHAD N. [facility] Pager [phone].  If 7PM-7AM, please contact night-coverage [email or web address] Password [email or web address] 07/19/2014, 11:28 PM

## 2014-07-19 NOTE — ED Notes (Signed)
Please call floor nurse Margreta Journey before bringing pt up at [phone]

## 2014-07-19 NOTE — ED Notes (Signed)
Patient transported to MRI 

## 2014-07-19 NOTE — ED Notes (Signed)
Pt stated that she ate a personal pan pizza from a gas station and then began to feel dizzy and diaphoretic.  Pt stated that she had no pain but was nauseous.  Per EMS when they arrived pt stated that all symptoms had subsided and that she didn't want to come to the ER anymore. Pts VS are as follows: 200/100 HR:75 CBG:118, O2sat: 96% on RA Temp:98.8

## 2014-07-19 NOTE — Consult Note (Addendum)
Cardiology Consult Note  Admit date: 07/19/2014 Name: Heather [name] 81 y.o.  female DOB:  01-Mar-1930 MRN:  [id number]  Today's date:  07/19/2014  Referring Physician:    [facility]  Primary Physician:    Dr. Claris Gower  Reason for Consultation:    Dizziness, diaphoresis, abnormal troponin  IMPRESSIONS: 1.  Nonspecific elevation of troponin without a clinical cardiac event that I can tell 2.  Clinical presentation is that of acute vertigo-she did feel somewhat dizzy and thought she might pass out 3.  Coronary artery disease with previous bypass grafting and previous inferior infarction July 2015 with stenting of the vein graft 4.  Hypertensive heart disease 5.  Hyperlipidemia with intolerance to statins and Zetia 6.  Sick sinus syndrome with episodes of junctional rhythm seen since she was here on telemetry with rates as low as 30.  RECOMMENDATION: This clinically did not sound like an infarction.  I do not see anything acute on the EKG.  Not totally sure why the troponin is elevated unless it is demand ischemia.  I would recommend continuing cycling of troponins and following her clinically.  Repeat EKG in the morning.  This sounds more like acute vertigo to me personally.  In terms of the bradycardia would recommend holding her atenolol and monitoring her rhythm for further episodes of bradycardia.  HISTORY: This 81 year old female has a history of previous coronary artery disease with bypass grafting in 2003.  Grafts were patent in 2011.  At the time of her bypass graft and she also had mitral valve repair.  She has a history of mild paroxysmal atrial fibrillation and mild chronic diastolic heart failure.  She was admitted with an acute inferior infarction in July 2015 and had stenting of a vein graft to the posterior lateral branch with a 3.5 x 18 mm resolute stent.  She was readmitted some dyspnea couple days later thought due to [facility].  She has been followed in the office  and has been fairly well since then.  Her brother died last week and she has been traveling back and forth to [place] for arrangements and then went to the funeral yesterday  Earlier this evening after eating a pan pizza from a gas station she took a nap and after lying in bed developed vertigo.  She was unable to walk and was extremely dizzy and felt nauseated.  She called EMS and then attempted to get to the door to unlock it and had to crawl across the floor to get to the door.  The symptoms lasted around 30 minutes.  She contacted EMS and was found to have a blood pressure 200/100.  She felt better and was not having any chest pain and decided to stay home but at the urging of her family member was brought to the emergency room.  She was not having any chest pain or shortness of breath at the time.  She attributed her symptoms to eating the personal pan pizza from the convenience store.  A troponin was drawn and was elevated at 1.27.  The patient currently has no complaints of chest pain or shortness of breath.  At no time has she had any chest pain today.  Past Medical History  Diagnosis Date  . S/P partial hysterectomy 1977  . Arthritis   . Coronary artery disease     a. s/p MI in 1980;  b. 2003 CABG x 3 (LIMA->LAD, VG->OM, VG->RPL);  c. 01/2009 Cath: 3/3 patent grafts, native 3VD, EF  60%.  . Hypothyroidism   . Hypertension   . PAF (paroxysmal atrial fibrillation)   . Chronic diastolic CHF (congestive heart failure)     a. EF 60% by LV gram 01/2009.  Marland Kitchen GERD (gastroesophageal reflux disease)   . H/O mitral valve repair     a. 2003  . CAD (coronary artery disease), native coronary artery 07/14/2013    Catheterization August/2003 showing severe three-vessel disease and moderate mitral regurgitation 08/28/2001 CABG with internal mammary graft to LAD, vein graft to circumflex, and vein graft to posterolateral branch and mitral valve repair by Dr. Roxy Manns Inferior infarction 07/12/13 with occlusion of  the vein graft to right coronary artery, stenting with a 3.518 mm resolute stents. Patent internal mammary graft with competitive flow in the distal vessel, occluded right coronary artery, occluded circumflex, patent saphenous vein graft to circumflex with mild/moderate disease proximally, minimal LV damage   . Hyperlipidemia 07/14/2013    Intolerance to multiple statins including Crestor, pravastatin, and Lipitor      Past Surgical History  Procedure Laterality Date  . Appendectomy  1950  . Thyroidectomy  1968  . Abdominal hysterectomy  1977  . Cholecystectomy    . Bladder suspension      tac  . Carpal tunnel release      right and left  . Knee arthroscopy      left  . Coronary artery bypass graft  2003  . Mitral valve repair  2003    with cabg  . Thyroidectomy, partial      left  . Cardiac catheterization  03,05,11  . Left heart catheterization with coronary angiogram N/A 07/12/2013    Procedure: LEFT HEART CATHETERIZATION WITH CORONARY ANGIOGRAM;  Surgeon: Jettie Booze, MD;  Location: [facility] CATH LAB;  Service: Cardiovascular;  Laterality: N/A;    Allergies:  is allergic to atorvastatin; codeine; crestor; pravastatin; simvastatin; and ticagrelor.   Medications: Prior to Admission medications   Medication Sig Start Date End Date Taking? Authorizing Provider  acetaminophen (TYLENOL) 500 MG tablet Take 500 mg by mouth every 6 (six) hours as needed for moderate pain.   Yes Historical Provider, MD  aspirin EC 81 MG tablet Take 81 mg by mouth daily.   Yes Historical Provider, MD  atenolol (TENORMIN) 25 MG tablet Take 12.5 mg by mouth 2 (two) times daily.    Yes Historical Provider, MD  calcium-vitamin D (OSCAL WITH D) 500-200 MG-UNIT per tablet Take 1 tablet by mouth daily.   Yes Historical Provider, MD  clopidogrel (PLAVIX) 75 MG tablet Take 1 tablet (75 mg total) by mouth daily. 07/17/13  Yes Jacolyn Reedy, MD  Coenzyme Q10 (CO Q 10) 100 MG CAPS Take 100 mg by mouth daily.   Yes  Historical Provider, MD  levothyroxine (SYNTHROID, LEVOTHROID) 50 MCG tablet Take 50 mcg by mouth daily.   Yes Historical Provider, MD  magnesium oxide (MAG-OX) 400 MG tablet Take 400 mg by mouth daily.   Yes Historical Provider, MD  Multiple Vitamin (MULTIVITAMIN WITH MINERALS) TABS tablet Take 1 tablet by mouth as needed (TAKES SOMETIMES).   Yes Historical Provider, MD  nitroGLYCERIN (NITROLINGUAL) 0.4 MG/SPRAY spray Place 1 spray under the tongue every 5 (five) minutes x 3 doses as needed for chest pain.   Yes Historical Provider, MD  Polyethyl Glycol-Propyl Glycol (SYSTANE) 0.4-0.3 % SOLN Apply 1 drop to eye daily as needed (FOR DRY EYES).   Yes Historical Provider, MD  ranitidine (ZANTAC) 150 MG tablet Take 150 mg by  mouth See admin instructions. MOSTLY TAKES ONCE DAILY, SOMETIMES TWICE DAILY AS NEEDED   Yes Historical Provider, MD    Family History: Family Status  Relation Status Death Age  . Mother Alive     Good Health  . Father Deceased 72    Heart Attack  . Sister Deceased     TB  . Sister Deceased     Ruptured Appendix    Social History:   reports that she quit smoking about 47 years ago. She has quit using smokeless tobacco. She reports that she does not drink alcohol or use illicit drugs.   History   Social History Narrative   Lives in [place].  Widowed.    Review of Systems: She has significant chronic low back pain as well as generalized arthritis.  She has normally been getting along well at home.  No significant shortness of breath, and no recent GI problems.  Other than as noted above the remainder of the review of systems is unremarkable.  Physical Exam: BP 145/69 mmHg  Pulse 56  Temp(Src) 98.5 F (36.9 C) (Oral)  Resp 22  SpO2 95%  General appearance: Pleasant elderly white female currently in no acute distress sitting up on the side of the bed Head: Normocephalic, without obvious abnormality, atraumatic Eyes: conjunctivae/corneas clear. PERRL, EOM's  intact. Fundi not examined  Neck: no adenopathy, no carotid bruit, no JVD and supple, symmetrical, trachea midline Lungs: clear to auscultation bilaterally Heart: Somewhat irregular rhythm, normal S1 and S2, no S3, soft 1 to 2/6 systolic murmur Abdomen: soft, non-tender; bowel sounds normal; no masses,  no organomegaly Pelvic: deferred Extremities: extremities normal, atraumatic, no cyanosis or edema Pulses: 2+ and symmetric Skin: Skin color, texture, turgor normal. No rashes or lesions Neurologic: No nystagmus, cranial nerves II-12 intact, normal range of motion and normal strength of all extremities, cerebellar not tested   Labs: CBC  Recent Labs  07/19/14 1827  WBC 6.5  RBC 4.04  HGB 11.6*  HCT 34.0*  PLT 175  MCV 84.2  MCH 28.7  MCHC 34.1  RDW 13.5  LYMPHSABS 1.0  MONOABS 0.4  EOSABS 0.1  BASOSABS 0.0   CMP   Recent Labs  07/19/14 1827  NA 136  K 4.3  CL 102  CO2 26  GLUCOSE 126*  BUN 13  CREATININE 0.86  CALCIUM 9.5  PROT 6.7  ALBUMIN 4.0  AST 19  ALT 14  ALKPHOS 65  BILITOT 0.7  GFRNONAA >60  GFRAA >60   Troponin (Point of Care Test)  Recent Labs  07/19/14 1901  TROPIPOC 1.37*   Radiology: Cardiomegaly, no active disease  EKG: Somewhat wandering baseline, previous inferior infarction, IV conduction delay, right bundle branch block  Review of telemetry since she arrived on the floor shows some episodes of junctional rhythm with isorhythmic A-V dissociation.  Signed:  Kerry Hough MD [facility]   Cardiology Consultant  07/19/2014, 10:06 PM

## 2014-07-19 NOTE — ED Provider Notes (Signed)
CSN: [id number]     Arrival date & time 07/19/14  1730 History   First MD Initiated Contact with Patient 07/19/14 1758     Chief Complaint  Patient presents with  . Dizziness     (Consider location/radiation/quality/duration/timing/severity/associated sxs/prior Treatment) HPI   81 year old female with history of paroxysmal atrial fibrillation , CAD status post MI with CABG with stent on Plavix, hypothyroidism, chronic CHF EF 60%, and history of mitral valve repair presents for evaluation of dizziness. Patient was brought here via EMS from home for evaluation of dizziness. Patient report she has been going through a lot of stress recently with the passing of her brother. For the past several days she has been traveling back and forth to [place] for the funeral. Today after eating a pan pizza from a gas station, she decided to take a nap and while laying in bed she began to experiencing room spinning sensation. Patient felt dizzy, break out into a sweat, felt hot flush throughout her head follows with generalized weakness. Symptoms lasting for less than 30 minutes. Onset was approximately 2 hours ago. She did contact EMS and was able to unlock the door. On initial exam, EMS noted that her blood pressure was 200/100, heart rate 75, CBG 118, O2 sats 96% on room air with a normal temperature of 98.8. She felt better despite any specific treatment and decided to stay home however at the urging of her family member, patient is here for further management. She felt that she is back to her baseline. Patient normally walks with a cane or walker. She was at home by herself. At this time she denies having fever, headache, diplopia, confusion, neck pain, chest pain, shortness of breath, abdominal pain, back pain, dysuria, focal numbness or weakness. No prior history of stroke.  Past Medical History  Diagnosis Date  . S/P partial hysterectomy 1977  . Arthritis   . Coronary artery disease     a. s/p MI in 1980;   b. 2003 CABG x 3 (LIMA->LAD, VG->OM, VG->RPL);  c. 01/2009 Cath: 3/3 patent grafts, native 3VD, EF 60%.  . Hypothyroidism   . Hypertension   . PAF (paroxysmal atrial fibrillation)   . Chronic diastolic CHF (congestive heart failure)     a. EF 60% by LV gram 01/2009.  Marland Kitchen GERD (gastroesophageal reflux disease)   . H/O mitral valve repair     a. 2003   Past Surgical History  Procedure Laterality Date  . Appendectomy  1950  . Thyroidectomy  1968  . Abdominal hysterectomy  1977  . Cholecystectomy    . Bladder suspension      tac  . Carpal tunnel release      right and left  . Knee arthroscopy      left  . Coronary artery bypass graft  2003  . Mitral valve repair  2003    with cabg  . Thyroidectomy, partial      left  . Cardiac catheterization  03,05,11  . Left heart catheterization with coronary angiogram N/A 07/12/2013    Procedure: LEFT HEART CATHETERIZATION WITH CORONARY ANGIOGRAM;  Surgeon: Jettie Booze, MD;  Location: [facility] CATH LAB;  Service: Cardiovascular;  Laterality: N/A;   Family History  Problem Relation Age of Onset  . Other      no premature CAD.   History  Substance Use Topics  . Smoking status: Former Smoker    Quit date: 06/06/1967  . Smokeless tobacco: Former Systems developer  . Alcohol Use: No  OB History    No data available     Review of Systems  All other systems reviewed and are negative.     Allergies  Atorvastatin; Codeine; Crestor; Pravastatin; Simvastatin; and Ticagrelor  Home Medications   Prior to Admission medications   Medication Sig Start Date End Date Taking? Authorizing Provider  aspirin EC 81 MG tablet Take 81 mg by mouth daily.    Historical Provider, MD  atenolol (TENORMIN) 25 MG tablet Take 12.5 mg by mouth 2 (two) times daily.     Historical Provider, MD  calcium-vitamin D (OSCAL WITH D) 500-200 MG-UNIT per tablet Take 1 tablet by mouth daily.    Historical Provider, MD  clopidogrel (PLAVIX) 75 MG tablet Take 1 tablet (75 mg  total) by mouth daily. 07/17/13   Jacolyn Reedy, MD  Coenzyme Q10 (CO Q 10) 100 MG CAPS Take 100 mg by mouth daily.    Historical Provider, MD  levothyroxine (SYNTHROID, LEVOTHROID) 50 MCG tablet Take 50 mcg by mouth daily.    Historical Provider, MD  magnesium oxide (MAG-OX) 400 MG tablet Take 400 mg by mouth daily.    Historical Provider, MD  nitroGLYCERIN (NITROLINGUAL) 0.4 MG/SPRAY spray Place 1 spray under the tongue every 5 (five) minutes x 3 doses as needed for chest pain.    Historical Provider, MD  ranitidine (ZANTAC) 150 MG tablet Take 150 mg by mouth daily.    Historical Provider, MD   BP 159/68 mmHg  Pulse 69  Temp(Src) 98.5 F (36.9 C) (Oral)  Resp 14  SpO2 98% Physical Exam  Constitutional: She is oriented to person, place, and time. She appears well-developed and well-nourished. No distress.  Caucasian elderly female in no acute distress, nontoxic in appearance.  HENT:  Head: Atraumatic.  Eyes: Conjunctivae are normal.  Neck: Neck supple. No JVD present.  Cardiovascular: Normal rate and regular rhythm.   Pulmonary/Chest: Effort normal and breath sounds normal.  Abdominal: Soft. There is no tenderness.  Musculoskeletal: She exhibits edema (Trace pitting edema bilaterally to lower extremities).  Neurological: She is alert and oriented to person, place, and time.  Neurologic exam:  Speech clear, pupils equal round reactive to light, extraocular movements intact  Normal peripheral visual fields Cranial nerves III through XII normal including no facial droop Follows commands, moves all extremities x4, normal strength to bilateral upper and lower extremities at all major muscle groups including grip Sensation normal to light touch  Coordination intact, no limb ataxia, finger-nose-finger normal Rapid alternating movements normal No pronator drift Gait unsteady, unable to ambulate.     Skin: No rash noted.  Psychiatric: She has a normal mood and affect.  Nursing note  and vitals reviewed.   ED Course  Procedures (including critical care time)  Patient here with bouts of dizziness and generalized weakness. Although patient felt she is back to her normal baseline, giving her significant cardiac history and been on blood thinner medication, workup initiated. Patient is unable to ambulate without assistance at this time. Otherwise she has no other focal neuro deficit.  7:18 PM Evidence of elevated troponin at 1.37 with no acute ischemic changes on EKG aside from isolated ST elevation in lead III and no active chest pain. Suspect NSTEMI.  I did consult with cardiologist DR. Wynonia Lawman who recommend medicine admission and he will be available for consultation. Care discussed with DR. Ralene Bathe.  8:39 PM Appreciate consultation from [facility] Dr. Hal Hope who agrees to admit pt to tele under his care.  Holding order placed.  Pt is made aware of plan and agrees with plan.   Labs Review Labs Reviewed  CBC WITH DIFFERENTIAL/PLATELET - Abnormal; Notable for the following:    Hemoglobin 11.6 (*)    HCT 34.0 (*)    All other components within normal limits  COMPREHENSIVE METABOLIC PANEL - Abnormal; Notable for the following:    Glucose, Bld 126 (*)    All other components within normal limits  URINALYSIS, ROUTINE W REFLEX MICROSCOPIC (NOT AT [facility]) - Abnormal; Notable for the following:    APPearance CLOUDY (*)    Hgb urine dipstick SMALL (*)    All other components within normal limits  URINE MICROSCOPIC-ADD ON - Abnormal; Notable for the following:    Casts GRANULAR CAST (*)    All other components within normal limits  I-STAT TROPOININ, ED - Abnormal; Notable for the following:    Troponin i, poc 1.37 (*)    All other components within normal limits  CBG MONITORING, ED - Abnormal; Notable for the following:    Glucose-Capillary 122 (*)    All other components within normal limits    Imaging Review Ct Head Wo Contrast  07/19/2014   CLINICAL DATA:  New  onset of dizziness.  Weakness.  EXAM: CT HEAD WITHOUT CONTRAST  TECHNIQUE: Contiguous axial images were obtained from the base of the skull through the vertex without intravenous contrast.  COMPARISON:  None.  FINDINGS: No acute cortical infarct, hemorrhage, or mass lesion is present. The ventricles are of normal size. No significant extra-axial fluid collection is evident. The paranasal sinuses and mastoid air cells are clear. The calvarium is intact. The extracranial soft tissues are within normal limits. Bilateral lens extractions are noted. The globes and orbits are otherwise intact.  IMPRESSION: Negative CT of the head.   Electronically Signed   By: San Morelle M.D.   On: 07/19/2014 19:34   Dg Chest Portable 1 View  07/19/2014   CLINICAL DATA:  Acute onset of dizziness which began 0430 hr earlier today. Current history of hypertension. Prior CABG. Prior episodes of CHF.  EXAM: PORTABLE CHEST - 1 VIEW  COMPARISON:  07/16/2013 and earlier, including CTA chest 07/16/2013.  FINDINGS: Prior sternotomy for CABG. Cardiac silhouette markedly enlarged, unchanged. Thoracic aorta tortuous and atherosclerotic, unchanged. Hilar and mediastinal contours otherwise unremarkable. Mild pulmonary venous hypertension without overt edema. Lungs clear. No pleural effusions. No pneumothorax.  IMPRESSION: Stable cardiomegaly.  No acute cardiopulmonary disease.   Electronically Signed   By: Evangeline Dakin M.D.   On: 07/19/2014 19:39     EKG Interpretation   Date/Time:  Sunday July 19 2014 17:38:48 EDT Ventricular Rate:  62 PR Interval:  230 QRS Duration: 160 QT Interval:  430 QTC Calculation: 437 R Axis:   -14 Text Interpretation:  Sinus or ectopic atrial rhythm Prolonged PR interval  Right bundle branch block Inferior infarct, old Confirmed by Hazle Coca  [phone]) on 07/19/2014 6:53:16 PM      MDM   Final diagnoses:  Dizziness  NSTEMI (non-ST elevated myocardial infarction)    BP 159/68 mmHg  Pulse  69  Temp(Src) 98.5 F (36.9 C) (Oral)  Resp 14  SpO2 98%  I have reviewed nursing notes and vital signs. I personally viewed the imaging tests through PACS system and agrees with radiologist's intepretation I reviewed available ER/hospitalization records through the EMR    Domenic Moras, PA-C 07/19/14 2039  Quintella Reichert, MD 07/20/14 [id number]

## 2014-07-20 DIAGNOSIS — I25119 Atherosclerotic heart disease of native coronary artery with unspecified angina pectoris: Secondary | ICD-10-CM

## 2014-07-20 DIAGNOSIS — E785 Hyperlipidemia, unspecified: Secondary | ICD-10-CM

## 2014-07-20 DIAGNOSIS — I10 Essential (primary) hypertension: Secondary | ICD-10-CM

## 2014-07-20 DIAGNOSIS — Z9889 Other specified postprocedural states: Secondary | ICD-10-CM

## 2014-07-20 DIAGNOSIS — M1990 Unspecified osteoarthritis, unspecified site: Secondary | ICD-10-CM | POA: Diagnosis not present

## 2014-07-20 DIAGNOSIS — R42 Dizziness and giddiness: Secondary | ICD-10-CM | POA: Diagnosis not present

## 2014-07-20 DIAGNOSIS — I2510 Atherosclerotic heart disease of native coronary artery without angina pectoris: Secondary | ICD-10-CM | POA: Diagnosis not present

## 2014-07-20 DIAGNOSIS — I214 Non-ST elevation (NSTEMI) myocardial infarction: Secondary | ICD-10-CM | POA: Diagnosis not present

## 2014-07-20 DIAGNOSIS — R7989 Other specified abnormal findings of blood chemistry: Secondary | ICD-10-CM | POA: Diagnosis not present

## 2014-07-20 LAB — BASIC METABOLIC PANEL
Anion gap: 10 (ref 5–15)
BUN: 14 mg/dL (ref 6–20)
CO2: 26 mmol/L (ref 22–32)
Calcium: 9.4 mg/dL (ref 8.9–10.3)
Chloride: 103 mmol/L (ref 101–111)
Creatinine, Ser: 0.78 mg/dL (ref 0.44–1.00)
GFR calc Af Amer: >60 mL/min (ref >60)
GFR calc non Af Amer: >60 mL/min (ref >60)
Glucose, Bld: 105 mg/dL — ABNORMAL HIGH (ref 65–99)
Potassium: 4.2 mmol/L (ref 3.5–5.1)
Sodium: 139 mmol/L (ref 135–145)

## 2014-07-20 LAB — CBC
HCT: 33.4 % — ABNORMAL LOW (ref 36.0–46.0)
Hemoglobin: 11.3 g/dL — ABNORMAL LOW (ref 12.0–15.0)
MCH: 28.6 pg (ref 26.0–34.0)
MCHC: 33.8 g/dL (ref 30.0–36.0)
MCV: 84.6 fL (ref 78.0–100.0)
Platelets: 165 10*3/uL (ref 150–400)
RBC: 3.95 MIL/uL (ref 3.87–5.11)
RDW: 13.4 % (ref 11.5–15.5)
WBC: 5.7 10*3/uL (ref 4.0–10.5)

## 2014-07-20 LAB — TROPONIN I
Troponin I: <0.03 ng/mL (ref <0.031)
Troponin I: <0.03 ng/mL (ref <0.031)
Troponin I: <0.03 ng/mL (ref <0.031)

## 2014-07-20 LAB — TSH: TSH: 2.949 u[IU]/mL (ref 0.350–4.500)

## 2014-07-20 LAB — SEDIMENTATION RATE: Sed Rate: 51 mm/hr — ABNORMAL HIGH (ref 0–22)

## 2014-07-20 MED ORDER — LOSARTAN POTASSIUM 50 MG PO TABS: 50.0000 mg | ORAL_TABLET | Freq: Every day | ORAL | Status: DC

## 2014-07-20 NOTE — Discharge Summary (Signed)
Physician Discharge Summary   Patient ID: Heather [name] MRN: [id number] DOB/AGE: 1930-02-27 81 y.o.  Admit date: 07/19/2014 Discharge date: 07/20/2014  Primary Care Physician:  Leonard Downing, MD  Discharge Diagnoses:     Near-syncope/dizziness  . Elevated troponin . CAD (coronary artery disease), native coronary artery . Hyperlipidemia . Hypothyroidism . Hypertension, uncontrolled  Consults: Cardiology, Dr. Wynonia Lawman   Recommendations for Outpatient Follow-up:  Patient was reminded to follow-up with cardiology office for the event monitor  Atenolol discontinued  Patient was started on losartan 50 mg daily for hypertension  TESTS THAT NEED FOLLOW-UP None   DIET: Heart healthy diet    Allergies:   Allergies  Allergen Reactions  . Atorvastatin     Muscle ache  . Codeine Nausea And Vomiting  . Crestor [Rosuvastatin]     Muscle ache  . Pravastatin     Muscle ache  . Simvastatin     Muscle cramps   . Ticagrelor     Dyspnea      Discharge Medications:   Medication List    STOP taking these medications        atenolol 25 MG tablet  Commonly known as:  TENORMIN      TAKE these medications        acetaminophen 500 MG tablet  Commonly known as:  TYLENOL  Take 500 mg by mouth every 6 (six) hours as needed for moderate pain.     aspirin EC 81 MG tablet  Take 81 mg by mouth daily.     calcium-vitamin D 500-200 MG-UNIT per tablet  Commonly known as:  OSCAL WITH D  Take 1 tablet by mouth daily.     clopidogrel 75 MG tablet  Commonly known as:  PLAVIX  Take 1 tablet (75 mg total) by mouth daily.     Co Q 10 100 MG Caps  Take 100 mg by mouth daily.     levothyroxine 50 MCG tablet  Commonly known as:  SYNTHROID, LEVOTHROID  Take 50 mcg by mouth daily.     losartan 50 MG tablet  Commonly known as:  COZAAR  Take 1 tablet (50 mg total) by mouth daily.     magnesium oxide 400 MG tablet  Commonly known as:  MAG-OX  Take 400 mg by mouth daily.      multivitamin with minerals Tabs tablet  Take 1 tablet by mouth as needed (TAKES SOMETIMES).     nitroGLYCERIN 0.4 MG/SPRAY spray  Commonly known as:  NITROLINGUAL  Place 1 spray under the tongue every 5 (five) minutes x 3 doses as needed for chest pain.     ranitidine 150 MG tablet  Commonly known as:  ZANTAC  Take 150 mg by mouth See admin instructions. MOSTLY TAKES ONCE DAILY, SOMETIMES TWICE DAILY AS NEEDED     SYSTANE 0.4-0.3 % Soln  Generic drug:  Polyethyl Glycol-Propyl Glycol  Apply 1 drop to eye daily as needed (FOR DRY EYES).         Brief H and P: For complete details please refer to admission H and P, but in brief Heather [name] is a 81 y.o. female with history of CAD status post CABG and stenting last year for inferior wall MI, hypothyroidism, hypertension presents to the ER because of sudden onset of dizziness and diaphoresis. Patient states that she had a pizza around 3 PM after attending the church. Patient went for a nap and at around 4:30 PM patient woke up with severe dizziness headache  and diaphoresis. Patient's headache was frontal. There was no focal deficits. Patient felt everything spinning around. Patient also became flushed in the face and diaphoretic. The whole symptom lasted for around 45 minutes and patient's family persuaded patient to come to the ER. In the ER CT head and MRI brain where showing nothing acute. Patient's point-of-care troponin was positive. On-call cardiologist Dr. Wynonia Lawman was consulted and patient has been admitted for further management of dizziness. Patient denies any chest pain or shortness of breath. Patient's headache is largely improved at this time. Patient denies any nausea vomiting abdominal pain or diarrhea. While monitoring the telemetry patient has had episodes of junctional rhythm with bradycardia.  Hospital Course:   Dizziness, near syncope - Resolved, patient was admitted to telemetry. MRI of the brain showed no acute  infarct. Cardiology was consulted as patient did have periods of junctional rhythm with bradycardia. Troponin point of care was noted to be positive at 1.37. Patient was seen by Dr. Wynonia Lawman, recommended to discontinue atenolol. Orthostatic vitals were negative. Patient is back to her baseline, ambulating in the room without any difficulty. Patient was started on losartan 50 mg daily for hypertension She will follow-up with Dr. Thurman Coyer office for the event monitor to be arranged outpatient.  Elevated troponin with history of CAD status post stenting and prior CABG - point-of-care troponins were elevated and at this time, hence patient was ruled out for acute ACS, repeat cardiac enzymes were all negative. Cardiology was consulted and recommended no further invasive workup at this time. Continue aspirin, Plavix, beta blocker discontinued due to bradycardia. Patient was started on losartan.   Hypothyroidism Continue Synthroid, TSH 2.9    Day of Discharge BP 144/62 mmHg  Pulse 73  Temp(Src) 98 F (36.7 C) (Oral)  Resp 20  Ht 4\' 11"  (1.499 m)  Wt 56.019 kg (123 lb 8 oz)  BMI 24.93 kg/m2  SpO2 94%  Physical Exam: General: Alert and awake oriented x3 not in any acute distress. HEENT: anicteric sclera, pupils reactive to light and accommodation CVS: S1-S2 clear no murmur rubs or gallops Chest: clear to auscultation bilaterally, no wheezing rales or rhonchi Abdomen: soft nontender, nondistended, normal bowel sounds Extremities: no cyanosis, clubbing or edema noted bilaterally Neuro: Cranial nerves II-XII intact, no focal neurological deficits   The results of significant diagnostics from this hospitalization (including imaging, microbiology, ancillary and laboratory) are listed below for reference.    LAB RESULTS: Basic Metabolic Panel:  Recent Labs Lab 07/19/14 1827 07/20/14 0430  NA 136 139  K 4.3 4.2  CL 102 103  CO2 26 26  GLUCOSE 126* 105*  BUN 13 14  CREATININE 0.86 0.78   CALCIUM 9.5 9.4   Liver Function Tests:  Recent Labs Lab 07/19/14 1827  AST 19  ALT 14  ALKPHOS 65  BILITOT 0.7  PROT 6.7  ALBUMIN 4.0   No results for input(s): LIPASE, AMYLASE in the last 168 hours. No results for input(s): AMMONIA in the last 168 hours. CBC:  Recent Labs Lab 07/19/14 1827 07/20/14 0430  WBC 6.5 5.7  NEUTROABS 5.0  --   HGB 11.6* 11.3*  HCT 34.0* 33.4*  MCV 84.2 84.6  PLT 175 165   Cardiac Enzymes:  Recent Labs Lab 07/20/14 0430 07/20/14 1025  TROPONINI <0.03 <0.03   BNP: Invalid input(s): POCBNP CBG:  Recent Labs Lab 07/19/14 1837  GLUCAP 122*    Significant Diagnostic Studies:  Ct Head Wo Contrast  07/19/2014   CLINICAL DATA:  New  onset of dizziness.  Weakness.  EXAM: CT HEAD WITHOUT CONTRAST  TECHNIQUE: Contiguous axial images were obtained from the base of the skull through the vertex without intravenous contrast.  COMPARISON:  None.  FINDINGS: No acute cortical infarct, hemorrhage, or mass lesion is present. The ventricles are of normal size. No significant extra-axial fluid collection is evident. The paranasal sinuses and mastoid air cells are clear. The calvarium is intact. The extracranial soft tissues are within normal limits. Bilateral lens extractions are noted. The globes and orbits are otherwise intact.  IMPRESSION: Negative CT of the head.   Electronically Signed   By: San Morelle M.D.   On: 07/19/2014 19:34   Mr Brain Wo Contrast  07/19/2014   CLINICAL DATA:  Acute onset of dizziness.  EXAM: MRI HEAD WITHOUT CONTRAST  TECHNIQUE: Multiplanar, multiecho pulse sequences of the brain and surrounding structures were obtained without intravenous contrast.  COMPARISON:  CT head from the same day.  FINDINGS: Minimal periventricular T2 changes are within normal limits for age. No acute infarct, hemorrhage, or mass lesion is present. The ventricles are of normal size. No significant extra-axial fluid collection is present.  Flow is  present in the major intracranial arteries. Bilateral lens replacements are noted. Globes orbits are otherwise intact. The paranasal sinuses and mastoid air cells are clear.  IMPRESSION: Normal MRI of the brain for age.   Electronically Signed   By: San Morelle M.D.   On: 07/19/2014 21:55   Dg Chest Portable 1 View  07/19/2014   CLINICAL DATA:  Acute onset of dizziness which began 0430 hr earlier today. Current history of hypertension. Prior CABG. Prior episodes of CHF.  EXAM: PORTABLE CHEST - 1 VIEW  COMPARISON:  07/16/2013 and earlier, including CTA chest 07/16/2013.  FINDINGS: Prior sternotomy for CABG. Cardiac silhouette markedly enlarged, unchanged. Thoracic aorta tortuous and atherosclerotic, unchanged. Hilar and mediastinal contours otherwise unremarkable. Mild pulmonary venous hypertension without overt edema. Lungs clear. No pleural effusions. No pneumothorax.  IMPRESSION: Stable cardiomegaly.  No acute cardiopulmonary disease.   Electronically Signed   By: Evangeline Dakin M.D.   On: 07/19/2014 19:39    2D ECHO:   Disposition and Follow-up:     Discharge Instructions    Diet - low sodium heart healthy    Complete by:  As directed      Increase activity slowly    Complete by:  As directed             DISPOSITION: Home   DISCHARGE FOLLOW-UP Follow-up Information    Follow up with Ezzard Standing, MD. Schedule an appointment as soon as possible for a visit in 2 weeks.   Specialty:  Cardiology   Why:  please call tomorrow 7/4 to set up the event monitor    Contact information:   [place] [address] [phone]       Follow up with Leonard Downing, MD. Schedule an appointment as soon as possible for a visit in 10 days.   Specialty:  Family Medicine   Why:  for hospital follow-up   Contact information:   [place] [address] [phone]        Time spent on Discharge: 35  minutes  Signed:   RAI,RIPUDEEP M.D. [facility] 07/20/2014, 12:27 PM Pager: [phone]

## 2014-07-20 NOTE — Progress Notes (Signed)
Subjective:  She feels better today and the headache that she had is currently better.  Absolutely no chest pain before admission or since she was here.  No shortness of breath.  Subsequent troponins are normal.  Still with occasional bradycardia.    Objective:  Vital Signs in the last 24 hours: BP 144/62 mmHg  Pulse 73  Temp(Src) 98 F (36.7 C) (Oral)  Resp 20  Ht 4\' 11"  (1.499 m)  Wt 56.019 kg (123 lb 8 oz)  BMI 24.93 kg/m2  SpO2 94%  Physical Exam: Pleasant white female in no acute distress mildly obese Lungs:  Clear Cardiac:  Mildly irregular rhythm, normal S1 and S2, no S3 Extremities:  No edema present   Weight Filed Weights   07/19/14 2222 07/20/14 0509  Weight: 56.019 kg (123 lb 8 oz) 56.019 kg (123 lb 8 oz)   Lab Results: Basic Metabolic Panel:  Recent Labs  07/19/14 1827 07/20/14 0430  NA 136 139  K 4.3 4.2  CL 102 103  CO2 26 26  GLUCOSE 126* 105*  BUN 13 14  CREATININE 0.86 0.78   CBC:  Recent Labs  07/19/14 1827 07/20/14 0430  WBC 6.5 5.7  NEUTROABS 5.0  --   HGB 11.6* 11.3*  HCT 34.0* 33.4*  MCV 84.2 84.6  PLT 175 165   Cardiac Enzymes: Troponin (Point of Care Test)  Recent Labs  07/19/14 1901  TROPIPOC 1.37*   Cardiac Panel (last 3 results)  Recent Labs  07/19/14 2340 07/20/14 0430  TROPONINI <0.03 <0.03    Telemetry: Sinus rhythm with first-degree AV block.  Some junctional beats noted overnight.  Assessment/Plan:  1.  Clinical presentation is that of acute vertigo that is better.  Neurologic imaging shows no evidence of a stroke 2.  One abnormal point-of-care troponin with subsequent negative troponins that is not indicative of any cardiac abnormality 3.  Intermittent junctional rhythm 4.  Coronary artery disease with previous stenting clinically stable  Recommendations:  No additional cardiac workup is necessary.  From a cardiac viewpoint is stable to go home.  I would discontinue her atenolol.  She will need to come  by the office after it opens next week and get an event monitor to be sure she does not have additional bradycardia.     Kerry Hough  MD [facility]  07/20/2014, 7:55 AM

## 2014-07-20 NOTE — Discharge Instructions (Signed)
Bradycardia °Bradycardia is a term for a heart rate (pulse) that, in adults, is slower than 60 beats per minute. A normal rate is 60 to 100 beats per minute. A heart rate below 60 beats per minute may be normal for some adults with healthy hearts. If the rate is too slow, the heart may have trouble pumping the volume of blood the body needs. If the heart rate gets too low, blood flow to the brain may be decreased and may make you feel lightheaded, dizzy, or faint. °The heart has a natural pacemaker in the top of the heart called the SA node (sinoatrial or sinus node). This pacemaker sends out regular electrical signals to the muscle of the heart, telling the heart muscle when to beat (contract). The electrical signal travels from the upper parts of the heart (atria) through the AV node (atrioventricular node), to the lower chambers of the heart (ventricles). The ventricles squeeze, pumping the blood from your heart to your lungs and to the rest of your body. °CAUSES  °· Problem with the heart's electrical system. °· Problem with the heart's natural pacemaker. °· Heart disease, damage, or infection. °· Medications. °· Problems with minerals and salts (electrolytes). °SYMPTOMS  °· Fainting (syncope). °· Fatigue and weakness. °· Shortness of breath (dyspnea). °· Chest pain (angina). °· Drowsiness. °· Confusion. °DIAGNOSIS  °· An electrocardiogram (ECG) can help your caregiver determine the type of slow heart rate you have. °· If the cause is not seen on an ECG, you may need to wear a heart monitor that records your heart rhythm for several hours or days. °· Blood tests. °TREATMENT  °· Electrolyte supplements. °· Medications. °· Withholding medication which is causing a slow heart rate. °· Pacemaker placement. °SEEK IMMEDIATE MEDICAL CARE IF:  °· You feel lightheaded or faint. °· You develop an irregular heart rate. °· You feel chest pain or have trouble breathing. °MAKE SURE YOU:  °· Understand these  instructions. °· Will watch your condition. °· Will get help right away if you are not doing well or get worse. °Document Released: 09/24/2001 Document Revised: 03/27/2011 Document Reviewed: 04/09/2013 °[employer]® Patient Information ©2015 [employer]. This information is not intended to replace advice given to you by your health care provider. Make sure you discuss any questions you have with your health care provider. ° °

## 2014-07-20 NOTE — Evaluation (Signed)
Physical Therapy Evaluation and DC Patient Details Name: Heather [name] MRN: [id number] DOB: 10-13-1930 Today's Date: 07/20/2014   History of Present Illness  Heather [name] is a 81 y.o. female with history of CAD status post CABG and stenting last year for inferior wall MI, hypothyroidism, hypertension presents to the ER because of sudden onset of dizziness and diaphoresis. Patient states that she had a pizza around 3 PM after attending the church. Patient went for a nap and at around 4:30 PM patient woke up with severe dizziness headache and diaphoresis. Patient's headache was frontal. There was no focal deficits. Patient felt everything spinning around. Patient also became flushed in the face and diaphoretic. The whole symptom lasted for around 45 minutes and patient's family persuaded patient to come to the ER. In the ER CT head and MRI brain where showing nothing acute. Patient's point-of-care troponin was positive. On-call cardiologist Dr. Wynonia [name] was consulted and patient has been admitted for further management of dizziness. Patient denies any chest pain or shortness of breath. Patient's headache is largely improved at this time. Patient denies any nausea vomiting abdominal pain or diarrhea. While monitoring the telemetry patient has had episodes of junctional rhythm with bradycardia  Clinical Impression  Pt admitted with above diagnosis. Pt currently without significant functional limitations and is functioning at baseline.   Pt with questionable slight right BPPV with treatment performed.  Pt feels ready to d/c home and should not require any PT f/u unless dizziness returns.  Pt aware to call MD if dizziness returns and get Outpt PT f/u.  No goals set as pt to d/c home after session.     Follow Up Recommendations No PT follow up    Equipment Recommendations  None recommended by PT    Recommendations for Other Services       Precautions / Restrictions Precautions Precautions:  Fall Restrictions Weight Bearing Restrictions: No      Mobility  Bed Mobility Overal bed mobility: Independent                Transfers Overall transfer level: Independent                  Ambulation/Gait Ambulation/Gait assistance: Min guard Ambulation Distance (Feet): 10 Feet Assistive device: 2 person hand held assist;Rolling walker (2 wheeled) Gait Pattern/deviations: Step-through pattern;Decreased step length - right;Decreased step length - left;Decreased stride length;Wide base of support   Gait velocity interpretation: Below normal speed for age/gender General Gait Details: moves slowly due to LE pain.  Family states she holds to furniture at home and uses RW at times.    Stairs            Wheelchair Mobility    Modified Rankin (Stroke Patients Only)       Balance Overall balance assessment: Needs assistance         Standing balance support: No upper extremity supported;During functional activity Standing balance-Leahy Scale: Fair Standing balance comment: can stand statically without UE support for brief periods of time.                              Pertinent Vitals/Pain Pain Assessment: No/denies pain  VSS    Home Living Family/patient expects to be discharged to:: Private residence Living Arrangements: Alone Available Help at Discharge: Family;Available 24 hours/day Type of Home: House Home Access: Ramped entrance     Home Layout: One level Home Equipment: Walker - 2  wheels;Cane - single point;Shower seat;Bedside commode;Wheelchair - manual      Prior Function Level of Independence: Independent with assistive device(s)               Hand Dominance        Extremity/Trunk Assessment   Upper Extremity Assessment: Defer to OT evaluation           Lower Extremity Assessment: Generalized weakness;RLE deficits/detail;LLE deficits/detail RLE Deficits / Details: has arthritis painful to move,grossly  3/5 LLE Deficits / Details: painful to move due to arthritis, grossly 3/5  Cervical / Trunk Assessment: Normal  Communication   Communication: No difficulties  Cognition Arousal/Alertness: Awake/alert Behavior During Therapy: WFL for tasks assessed/performed Overall Cognitive Status: Within Functional Limits for tasks assessed                      General Comments General comments (skin integrity, edema, etc.): PT checked all canals for BPPV with questionably positive test for right BPPV.  Pt appeared to have a little rotary nystagmus however had no symptoms.  Went ahead and treated with canalith repositioning maneuver for right BPPV as a precaution.  Informed pt to f/u at Outpt if the vertigo returned. Pt agreed.      Exercises        Assessment/Plan    PT Assessment Patent does not need any further PT services  PT Diagnosis Generalized weakness   PT Problem List    PT Treatment Interventions     PT Goals (Current goals can be found in the Care Plan section) Acute Rehab PT Goals Patient Stated Goal: leave after the evaluation PT Goal Formulation: All assessment and education complete, DC therapy    Frequency     Barriers to discharge        Co-evaluation               End of Session   Activity Tolerance: Patient tolerated treatment well Patient left: in bed;with call bell/phone within reach;with family/visitor present Nurse Communication: Mobility status    Functional Assessment Tool Used: clinical judgement Functional Limitation: Changing and maintaining body position Changing and Maintaining Body Position Current Status [id number]): At least 1 percent but less than 20 percent impaired, limited or restricted Changing and Maintaining Body Position Goal Status [id number]): At least 1 percent but less than 20 percent impaired, limited or restricted Changing and Maintaining Body Position Discharge Status [phone]): At least 1 percent but less than 20 percent  impaired, limited or restricted    Time: [id number] PT Time Calculation (min) (ACUTE ONLY): 23 min   Charges:   PT Evaluation $Initial PT Evaluation Tier I: 1 Procedure PT Treatments $Canalith Rep Proc: 8-22 mins   PT G Codes:   PT G-Codes **NOT FOR INPATIENT CLASS** Functional Assessment Tool Used: clinical judgement Functional Limitation: Changing and maintaining body position Changing and Maintaining Body Position Current Status [id number]): At least 1 percent but less than 20 percent impaired, limited or restricted Changing and Maintaining Body Position Goal Status [id number]): At least 1 percent but less than 20 percent impaired, limited or restricted Changing and Maintaining Body Position Discharge Status [phone]): At least 1 percent but less than 20 percent impaired, limited or restricted    Denice Paradise 07/20/2014, 1:39 PM [place] White,PT Acute Rehabilitation [phone] (pager)

## 2014-08-19 ENCOUNTER — Encounter: Payer: Self-pay | Admitting: Cardiology

## 2015-04-29 ENCOUNTER — Emergency Department (HOSPITAL_COMMUNITY): Payer: Medicare Other

## 2015-04-29 ENCOUNTER — Encounter (HOSPITAL_COMMUNITY): Payer: Self-pay | Admitting: Emergency Medicine

## 2015-04-29 ENCOUNTER — Inpatient Hospital Stay (HOSPITAL_COMMUNITY)
Admission: EM | Admit: 2015-04-29 | Discharge: 2015-05-01 | DRG: 981 | Disposition: A | Discharge status: Home / Self Care | Payer: Medicare Other | Attending: Cardiology | Admitting: Cardiology

## 2015-04-29 DIAGNOSIS — E785 Hyperlipidemia, unspecified: Secondary | ICD-10-CM | POA: Diagnosis present

## 2015-04-29 DIAGNOSIS — I252 Old myocardial infarction: Secondary | ICD-10-CM

## 2015-04-29 DIAGNOSIS — Z7902 Long term (current) use of antithrombotics/antiplatelets: Secondary | ICD-10-CM

## 2015-04-29 DIAGNOSIS — I480 Paroxysmal atrial fibrillation: Secondary | ICD-10-CM | POA: Diagnosis present

## 2015-04-29 DIAGNOSIS — Z87891 Personal history of nicotine dependence: Secondary | ICD-10-CM

## 2015-04-29 DIAGNOSIS — I119 Hypertensive heart disease without heart failure: Secondary | ICD-10-CM | POA: Diagnosis present

## 2015-04-29 DIAGNOSIS — I200 Unstable angina: Secondary | ICD-10-CM

## 2015-04-29 DIAGNOSIS — E039 Hypothyroidism, unspecified: Secondary | ICD-10-CM | POA: Diagnosis not present

## 2015-04-29 DIAGNOSIS — K219 Gastro-esophageal reflux disease without esophagitis: Secondary | ICD-10-CM | POA: Diagnosis present

## 2015-04-29 DIAGNOSIS — I214 Non-ST elevation (NSTEMI) myocardial infarction: Secondary | ICD-10-CM | POA: Diagnosis present

## 2015-04-29 DIAGNOSIS — I25810 Atherosclerosis of coronary artery bypass graft(s) without angina pectoris: Secondary | ICD-10-CM | POA: Diagnosis present

## 2015-04-29 DIAGNOSIS — Z7982 Long term (current) use of aspirin: Secondary | ICD-10-CM

## 2015-04-29 DIAGNOSIS — Z952 Presence of prosthetic heart valve: Secondary | ICD-10-CM

## 2015-04-29 DIAGNOSIS — I110 Hypertensive heart disease with heart failure: Secondary | ICD-10-CM | POA: Diagnosis present

## 2015-04-29 DIAGNOSIS — I5032 Chronic diastolic (congestive) heart failure: Secondary | ICD-10-CM | POA: Diagnosis present

## 2015-04-29 DIAGNOSIS — I25110 Atherosclerotic heart disease of native coronary artery with unstable angina pectoris: Secondary | ICD-10-CM | POA: Diagnosis present

## 2015-04-29 DIAGNOSIS — Z951 Presence of aortocoronary bypass graft: Secondary | ICD-10-CM

## 2015-04-29 DIAGNOSIS — I452 Bifascicular block: Secondary | ICD-10-CM | POA: Diagnosis present

## 2015-04-29 DIAGNOSIS — Z9889 Other specified postprocedural states: Secondary | ICD-10-CM

## 2015-04-29 DIAGNOSIS — I25710 Atherosclerosis of autologous vein coronary artery bypass graft(s) with unstable angina pectoris: Secondary | ICD-10-CM | POA: Diagnosis present

## 2015-04-29 DIAGNOSIS — Z955 Presence of coronary angioplasty implant and graft: Secondary | ICD-10-CM

## 2015-04-29 LAB — BASIC METABOLIC PANEL
Anion gap: 13 (ref 5–15)
BUN: 9 mg/dL (ref 6–20)
CO2: 21 mmol/L — ABNORMAL LOW (ref 22–32)
Calcium: 9.3 mg/dL (ref 8.9–10.3)
Chloride: 102 mmol/L (ref 101–111)
Creatinine, Ser: 0.73 mg/dL (ref 0.44–1.00)
GFR calc Af Amer: >60 mL/min (ref >60)
GFR calc non Af Amer: >60 mL/min (ref >60)
Glucose, Bld: 126 mg/dL — ABNORMAL HIGH (ref 65–99)
Potassium: 3.9 mmol/L (ref 3.5–5.1)
Sodium: 136 mmol/L (ref 135–145)

## 2015-04-29 LAB — I-STAT TROPONIN, ED: Troponin i, poc: 0.04 ng/mL (ref 0.00–0.08)

## 2015-04-29 LAB — PROTIME-INR
INR: 1.07 (ref 0.00–1.49)
Prothrombin Time: 14.1 seconds (ref 11.6–15.2)

## 2015-04-29 LAB — CBC
HCT: 36.1 % (ref 36.0–46.0)
Hemoglobin: 12.2 g/dL (ref 12.0–15.0)
MCH: 28.8 pg (ref 26.0–34.0)
MCHC: 33.8 g/dL (ref 30.0–36.0)
MCV: 85.3 fL (ref 78.0–100.0)
Platelets: 175 10*3/uL (ref 150–400)
RBC: 4.23 MIL/uL (ref 3.87–5.11)
RDW: 13.5 % (ref 11.5–15.5)
WBC: 8.9 10*3/uL (ref 4.0–10.5)

## 2015-04-29 LAB — TROPONIN I: Troponin I: 0.04 ng/mL — ABNORMAL HIGH (ref <0.031)

## 2015-04-29 MED ORDER — MORPHINE SULFATE (PF) 2 MG/ML IV SOLN
2.0000 mg | Freq: Once | INTRAVENOUS | Status: AC
  Administered 2015-04-29: 2 mg via INTRAVENOUS
  Filled 2015-04-29: qty 1

## 2015-04-29 MED ORDER — NITROGLYCERIN 2 % TD OINT
1.0000 [in_us] | TOPICAL_OINTMENT | Freq: Four times a day (QID) | TRANSDERMAL | Status: DC
  Administered 2015-04-29 – 2015-05-01 (×4): 1 [in_us] via TOPICAL
  Filled 2015-04-29 (×2): qty 1

## 2015-04-29 NOTE — ED Provider Notes (Signed)
CSN: [id number]     Arrival date & time 04/29/15  2208 History   First MD Initiated Contact with Patient 04/29/15 2216     Chief Complaint  Patient presents with  . Chest Pain     (Consider location/radiation/quality/duration/timing/severity/associated sxs/prior Treatment) HPI Patient has some chest pain yesterday when mopping the floor. It improved with the nitroglycerin. He reports she awakened at 4:30 this morning with chest pain and has been having a waxing and waning now through much of the day. She's been getting temporary relief with nitroglycerin. She reports she is also getting tight sensation up towards her jaw. Some short of breath. No cough, no lower extremity swelling, no Pain. Past Medical History  Diagnosis Date  . S/P partial hysterectomy 1977  . Arthritis   . Coronary artery disease     a. s/p MI in 1980;  b. 2003 CABG x 3 (LIMA->LAD, VG->OM, VG->RPL);  c. 01/2009 Cath: 3/3 patent grafts, native 3VD, EF 60%.  . Hypothyroidism   . Hypertension   . PAF (paroxysmal atrial fibrillation) ([place])   . Chronic diastolic CHF (congestive heart failure) ([facility])     a. EF 60% by LV gram 01/2009.  Marland Kitchen GERD (gastroesophageal reflux disease)   . H/O mitral valve repair     a. 2003  . CAD (coronary artery disease), native coronary artery 07/14/2013    Catheterization August/2003 showing severe three-vessel disease and moderate mitral regurgitation 08/28/2001 CABG with internal mammary graft to LAD, vein graft to circumflex, and vein graft to posterolateral branch and mitral valve repair by Dr. 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 Patent internal mammary graft with competitive flow in the distal vessel, occluded right coronary artery, occluded circumflex, patent saphenous vein graft to circumflex with mild/moderate disease proximally, minimal LV damage   . Hyperlipidemia 07/14/2013    Intolerance to  multiple statins including Crestor, pravastatin, and Lipitor    Past Surgical History  Procedure Laterality Date  . Appendectomy  1950  . Thyroidectomy  1968  . Abdominal hysterectomy  1977  . Cholecystectomy    . Bladder suspension      tac  . Carpal tunnel release      right and left  . Knee arthroscopy      left  . Coronary artery bypass graft  2003  . Mitral valve repair  2003    with cabg  . Thyroidectomy, partial      left  . Cardiac catheterization  03,05,11  . Left heart catheterization with coronary angiogram N/A 07/12/2013    Procedure: LEFT HEART CATHETERIZATION WITH CORONARY ANGIOGRAM;  Surgeon: Jettie Booze, MD;  Location: [facility] C[facility] LAB;  Service: Cardiovascular;  Laterality: N/A;   Family History  Problem Relation Age of Onset  . Other      no premature CAD.   Social History  Substance Use Topics  . Smoking status: Former Smoker    Quit date: 06/06/1967  . Smokeless tobacco: Former Systems developer  . Alcohol Use: No   OB History    No data available     Review of Systems 10 Systems reviewed and are negative for acute change except as noted in the HPI.   Allergies  Atorvastatin; Codeine; Crestor; Pravastatin; Simvastatin; and Ticagrelor  Home Medications   Prior to Admission medications   Medication Sig Start Date End Date Taking? Authorizing Provider  acetaminophen (TYLENOL) 500 MG tablet Take 500 mg  by mouth every 6 (six) hours as needed for moderate pain.   Yes Historical Provider, MD  aspirin EC 81 MG tablet Take 81 mg by mouth daily.   Yes Historical Provider, MD  calcium-vitamin D (OSCAL WITH D) 500-200 MG-UNIT per tablet Take 1 tablet by mouth daily.   Yes Historical Provider, MD  clopidogrel (PLAVIX) 75 MG tablet Take 1 tablet (75 mg total) by mouth daily. 07/17/13  Yes Jacolyn Reedy, MD  levothyroxine (SYNTHROID, LEVOTHROID) 50 MCG tablet Take 50 mcg by mouth daily.   Yes Historical Provider, MD  losartan (COZAAR) 50 MG tablet Take 1 tablet (50  mg total) by mouth daily. 07/20/14  Yes Ripudeep Krystal Eaton, MD  magnesium oxide (MAG-OX) 400 MG tablet Take 250 mg by mouth daily.    Yes Historical Provider, MD  nitroGLYCERIN (NITROSTAT) 0.4 MG SL tablet Place 0.4 mg under the tongue every 5 (five) minutes as needed for chest pain.   Yes Historical Provider, MD  Polyethyl Glycol-Propyl Glycol (SYSTANE) 0.4-0.3 % SOLN Apply 1 drop to eye daily as needed (FOR DRY EYES).   Yes Historical Provider, MD  ranitidine (ZANTAC) 150 MG tablet Take 150 mg by mouth daily as needed for heartburn.    Yes Historical Provider, MD   BP 103/71 mmHg  Pulse 94  Temp(Src) 98.4 F (36.9 C) (Oral)  Resp 22  Ht 4\' 11"  (1.499 m)  Wt 185 lb (83.915 kg)  BMI 37.35 kg/m2  SpO2 94% Physical Exam  Constitutional: She is oriented to person, place, and time. She appears well-developed and well-nourished.  HENT:  Head: Normocephalic and atraumatic.  Eyes: EOM are normal. Pupils are equal, round, and reactive to light.  Neck: Neck supple.  Cardiovascular: Regular rhythm, normal heart sounds and intact distal pulses.   Tachycardia no murmur gallop  Pulmonary/Chest: Effort normal and breath sounds normal.  Abdominal: Soft. Bowel sounds are normal. She exhibits no distension. There is no tenderness.  Musculoskeletal: Normal range of motion. She exhibits no edema or tenderness.  Neurological: She is alert and oriented to person, place, and time. She has normal strength. Coordination normal. GCS eye subscore is 4. GCS verbal subscore is 5. GCS motor subscore is 6.  Skin: Skin is warm, dry and intact.  Psychiatric: She has a normal mood and affect.    ED Course  Procedures (including critical care time) Labs Review Labs Reviewed  BASIC METABOLIC PANEL - Abnormal; Notable for the following:    CO2 21 (*)    Glucose, Bld 126 (*)    All other components within normal limits  TROPONIN I - Abnormal; Notable for the following:    Troponin I 0.04 (*)    All other components  within normal limits  CBC  PROTIME-INR  Randolm Idol, ED    Imaging Review Dg Chest 2 View  04/29/2015  CLINICAL DATA:  Chest pain and tachypnea EXAM: CHEST  2 VIEW COMPARISON:  July 19, 2014 FINDINGS: There is no edema or consolidation. Heart is mildly enlarged with pulmonary vascularity within normal limits. No adenopathy. Aorta is tortuous with calcification in the arch region. Patient is status post coronary artery bypass grafting. No adenopathy. There is degenerative change in the thoracic spine. There is an azygos lobe on the right, an anatomic variant. IMPRESSION: No edema or consolidation. Heart mildly enlarged. Status post coronary artery bypass grafting. Electronically Signed   By: Lowella Grip III M.D.   On: 04/29/2015 22:46   I have personally reviewed and  evaluated these images and lab results as part of my medical decision-making.   EKG Interpretation   Date/Time:  Thursday April 29 2015 22:13:12 EDT Ventricular Rate:  100 PR Interval:  168 QRS Duration: 154 QT Interval:  394 QTC Calculation: 508 R Axis:   57 Text Interpretation:  Ectopic atrial tachycardia, unifocal Right bundle  branch block agree. no sig change from old Confirmed by Johnney Killian, MD,  Jeannie Done [phone]) on 04/29/2015 10:16:52 PM     Consult: (00:05) Cardiology consult and will see patient in the emergency department. MDM   Final diagnoses:  Unstable angina [facility])   Patient presents with chest pain that had been improving with nitroglycerin. The pain increased and became more tight up into her throat and her jaw. Patient has history of prior MI. At this time she'll be admitted for rule out MI with high risk for unstable angina/ACS First cardiac enzyme is negative.    Charlesetta Shanks, MD 04/30/15 [phone]

## 2015-04-29 NOTE — ED Notes (Signed)
Pt in reporting CP present since 0430. Pain went away with 1NTG and then returned this afternoon. Took another NTG and applied NTG paste, pain was unrelieved so called EMS. Cental CP radiating to jaw. Tight in nature. Took 324 ASA. Hx 5 MI.

## 2015-04-30 ENCOUNTER — Encounter (HOSPITAL_COMMUNITY)
Admission: EM | Disposition: A | Discharge status: Home / Self Care | Payer: Self-pay | Source: Home / Self Care | Attending: Cardiology

## 2015-04-30 ENCOUNTER — Encounter (HOSPITAL_COMMUNITY): Payer: Self-pay | Admitting: Interventional Cardiology

## 2015-04-30 DIAGNOSIS — I480 Paroxysmal atrial fibrillation: Secondary | ICD-10-CM | POA: Diagnosis present

## 2015-04-30 DIAGNOSIS — E785 Hyperlipidemia, unspecified: Secondary | ICD-10-CM | POA: Diagnosis present

## 2015-04-30 DIAGNOSIS — I25710 Atherosclerosis of autologous vein coronary artery bypass graft(s) with unstable angina pectoris: Secondary | ICD-10-CM | POA: Diagnosis present

## 2015-04-30 DIAGNOSIS — Z952 Presence of prosthetic heart valve: Secondary | ICD-10-CM | POA: Diagnosis not present

## 2015-04-30 DIAGNOSIS — I200 Unstable angina: Secondary | ICD-10-CM | POA: Diagnosis present

## 2015-04-30 DIAGNOSIS — I25709 Atherosclerosis of coronary artery bypass graft(s), unspecified, with unspecified angina pectoris: Secondary | ICD-10-CM

## 2015-04-30 DIAGNOSIS — I25110 Atherosclerotic heart disease of native coronary artery with unstable angina pectoris: Secondary | ICD-10-CM | POA: Diagnosis present

## 2015-04-30 DIAGNOSIS — Z7982 Long term (current) use of aspirin: Secondary | ICD-10-CM | POA: Diagnosis not present

## 2015-04-30 DIAGNOSIS — Z7902 Long term (current) use of antithrombotics/antiplatelets: Secondary | ICD-10-CM | POA: Diagnosis not present

## 2015-04-30 DIAGNOSIS — I252 Old myocardial infarction: Secondary | ICD-10-CM | POA: Diagnosis not present

## 2015-04-30 DIAGNOSIS — K219 Gastro-esophageal reflux disease without esophagitis: Secondary | ICD-10-CM | POA: Diagnosis present

## 2015-04-30 DIAGNOSIS — I110 Hypertensive heart disease with heart failure: Secondary | ICD-10-CM | POA: Diagnosis present

## 2015-04-30 DIAGNOSIS — I214 Non-ST elevation (NSTEMI) myocardial infarction: Secondary | ICD-10-CM | POA: Diagnosis present

## 2015-04-30 DIAGNOSIS — Z955 Presence of coronary angioplasty implant and graft: Secondary | ICD-10-CM | POA: Diagnosis not present

## 2015-04-30 DIAGNOSIS — R079 Chest pain, unspecified: Secondary | ICD-10-CM | POA: Diagnosis not present

## 2015-04-30 DIAGNOSIS — Z87891 Personal history of nicotine dependence: Secondary | ICD-10-CM | POA: Diagnosis not present

## 2015-04-30 DIAGNOSIS — Z951 Presence of aortocoronary bypass graft: Secondary | ICD-10-CM | POA: Diagnosis not present

## 2015-04-30 DIAGNOSIS — E039 Hypothyroidism, unspecified: Secondary | ICD-10-CM | POA: Diagnosis present

## 2015-04-30 DIAGNOSIS — I5032 Chronic diastolic (congestive) heart failure: Secondary | ICD-10-CM | POA: Diagnosis present

## 2015-04-30 DIAGNOSIS — I452 Bifascicular block: Secondary | ICD-10-CM | POA: Diagnosis present

## 2015-04-30 HISTORY — PX: CARDIAC CATHETERIZATION: SHX172

## 2015-04-30 LAB — TROPONIN I
Troponin I: 0.94 ng/mL (ref <0.031)
Troponin I: 1.54 ng/mL (ref <0.031)
Troponin I: 2.07 ng/mL (ref <0.031)

## 2015-04-30 LAB — CBC
HCT: 33.6 % — ABNORMAL LOW (ref 36.0–46.0)
HCT: 32.5 % — ABNORMAL LOW (ref 36.0–46.0)
Hemoglobin: 11.2 g/dL — ABNORMAL LOW (ref 12.0–15.0)
Hemoglobin: 10.8 g/dL — ABNORMAL LOW (ref 12.0–15.0)
MCH: 28.5 pg (ref 26.0–34.0)
MCH: 28.4 pg (ref 26.0–34.0)
MCHC: 33.3 g/dL (ref 30.0–36.0)
MCHC: 33.2 g/dL (ref 30.0–36.0)
MCV: 85.5 fL (ref 78.0–100.0)
MCV: 85.5 fL (ref 78.0–100.0)
Platelets: 190 10*3/uL (ref 150–400)
Platelets: 157 10*3/uL (ref 150–400)
RBC: 3.93 MIL/uL (ref 3.87–5.11)
RBC: 3.8 MIL/uL — ABNORMAL LOW (ref 3.87–5.11)
RDW: 13.5 % (ref 11.5–15.5)
RDW: 13.7 % (ref 11.5–15.5)
WBC: 9.3 10*3/uL (ref 4.0–10.5)
WBC: 7.7 10*3/uL (ref 4.0–10.5)

## 2015-04-30 LAB — CREATININE, SERUM
Creatinine, Ser: 0.81 mg/dL (ref 0.44–1.00)
GFR calc Af Amer: >60 mL/min (ref >60)
GFR calc non Af Amer: >60 mL/min (ref >60)

## 2015-04-30 LAB — PROTIME-INR
INR: 1.3 (ref 0.00–1.49)
Prothrombin Time: 16.3 seconds — ABNORMAL HIGH (ref 11.6–15.2)

## 2015-04-30 LAB — LIPID PANEL
Cholesterol: 180 mg/dL (ref 0–200)
HDL: 27 mg/dL — ABNORMAL LOW (ref >40)
LDL Cholesterol: 103 mg/dL — ABNORMAL HIGH (ref 0–99)
Total CHOL/HDL Ratio: 6.7 RATIO
Triglycerides: 249 mg/dL — ABNORMAL HIGH (ref <150)
VLDL: 50 mg/dL — ABNORMAL HIGH (ref 0–40)

## 2015-04-30 LAB — BASIC METABOLIC PANEL
Anion gap: 12 (ref 5–15)
BUN: 11 mg/dL (ref 6–20)
CO2: 23 mmol/L (ref 22–32)
Calcium: 9.1 mg/dL (ref 8.9–10.3)
Chloride: 102 mmol/L (ref 101–111)
Creatinine, Ser: 0.64 mg/dL (ref 0.44–1.00)
GFR calc Af Amer: >60 mL/min (ref >60)
GFR calc non Af Amer: >60 mL/min (ref >60)
Glucose, Bld: 141 mg/dL — ABNORMAL HIGH (ref 65–99)
Potassium: 4.1 mmol/L (ref 3.5–5.1)
Sodium: 137 mmol/L (ref 135–145)

## 2015-04-30 LAB — MRSA PCR SCREENING: MRSA by PCR: NEGATIVE

## 2015-04-30 LAB — POCT ACTIVATED CLOTTING TIME: Activated Clotting Time: 435 seconds

## 2015-04-30 SURGERY — CORONARY STENT INTERVENTION

## 2015-04-30 MED ORDER — LOSARTAN POTASSIUM 50 MG PO TABS
50.0000 mg | ORAL_TABLET | Freq: Every day | ORAL | Status: DC
  Administered 2015-04-30 – 2015-05-01 (×2): 50 mg via ORAL
  Filled 2015-04-30 (×2): qty 1

## 2015-04-30 MED ORDER — SODIUM CHLORIDE 0.9 % IV SOLN: 250.0000 mL | INTRAVENOUS | Status: DC | PRN

## 2015-04-30 MED ORDER — SODIUM CHLORIDE 0.9% FLUSH: 3.0000 mL | INTRAVENOUS | Status: DC | PRN

## 2015-04-30 MED ORDER — FAMOTIDINE 20 MG PO TABS
20.0000 mg | ORAL_TABLET | Freq: Two times a day (BID) | ORAL | Status: DC
  Administered 2015-04-30 – 2015-05-01 (×4): 20 mg via ORAL
  Filled 2015-04-30 (×4): qty 1

## 2015-04-30 MED ORDER — CLOPIDOGREL BISULFATE 75 MG PO TABS
ORAL_TABLET | ORAL | Status: AC
  Filled 2015-04-30: qty 3

## 2015-04-30 MED ORDER — MORPHINE SULFATE (PF) 2 MG/ML IV SOLN: 2.0000 mg | INTRAVENOUS | Status: DC | PRN

## 2015-04-30 MED ORDER — BIVALIRUDIN BOLUS VIA INFUSION - CUPID
INTRAVENOUS | Status: DC | PRN
  Administered 2015-04-30: 61.875 mg via INTRAVENOUS

## 2015-04-30 MED ORDER — LIDOCAINE HCL (PF) 1 % IJ SOLN
INTRAMUSCULAR | Status: DC | PRN
  Administered 2015-04-30: 2 mL
  Administered 2015-04-30: 25 mL

## 2015-04-30 MED ORDER — MIDAZOLAM HCL 2 MG/2ML IJ SOLN
INTRAMUSCULAR | Status: DC | PRN
  Administered 2015-04-30 (×2): 0.5 mg via INTRAVENOUS
  Administered 2015-04-30: 1 mg via INTRAVENOUS

## 2015-04-30 MED ORDER — ONDANSETRON HCL 4 MG/2ML IJ SOLN
4.0000 mg | Freq: Four times a day (QID) | INTRAMUSCULAR | Status: DC | PRN
  Administered 2015-04-30: 4 mg via INTRAVENOUS
  Filled 2015-04-30: qty 2

## 2015-04-30 MED ORDER — HEPARIN SODIUM (PORCINE) 1000 UNIT/ML IJ SOLN
INTRAMUSCULAR | Status: AC
  Filled 2015-04-30: qty 1

## 2015-04-30 MED ORDER — MAGNESIUM OXIDE 400 (241.3 MG) MG PO TABS
200.0000 mg | ORAL_TABLET | Freq: Every day | ORAL | Status: DC
  Administered 2015-04-30 – 2015-05-01 (×2): 200 mg via ORAL
  Filled 2015-04-30 (×2): qty 1

## 2015-04-30 MED ORDER — BIVALIRUDIN 250 MG IV SOLR
INTRAVENOUS | Status: AC
  Filled 2015-04-30: qty 250

## 2015-04-30 MED ORDER — SODIUM CHLORIDE 0.9 % WEIGHT BASED INFUSION: 1.0000 mL/kg/h | INTRAVENOUS | Status: DC

## 2015-04-30 MED ORDER — IOPAMIDOL (ISOVUE-370) INJECTION 76%
INTRAVENOUS | Status: AC
  Filled 2015-04-30: qty 50

## 2015-04-30 MED ORDER — CLOPIDOGREL BISULFATE 75 MG PO TABS
ORAL_TABLET | ORAL | Status: DC | PRN
  Administered 2015-04-30: 225 mg via ORAL

## 2015-04-30 MED ORDER — IOPAMIDOL (ISOVUE-370) INJECTION 76%
INTRAVENOUS | Status: AC
  Filled 2015-04-30: qty 100

## 2015-04-30 MED ORDER — NITROGLYCERIN 0.4 MG SL SUBL: 0.4000 mg | SUBLINGUAL_TABLET | SUBLINGUAL | Status: DC | PRN

## 2015-04-30 MED ORDER — ASPIRIN EC 81 MG PO TBEC: 81.0000 mg | DELAYED_RELEASE_TABLET | Freq: Every day | ORAL | Status: DC

## 2015-04-30 MED ORDER — CLOPIDOGREL BISULFATE 75 MG PO TABS
75.0000 mg | ORAL_TABLET | Freq: Every day | ORAL | Status: DC
  Administered 2015-04-30: 75 mg via ORAL
  Filled 2015-04-30: qty 1

## 2015-04-30 MED ORDER — ASPIRIN 300 MG RE SUPP: 300.0000 mg | RECTAL | Status: DC

## 2015-04-30 MED ORDER — ACETAMINOPHEN 325 MG PO TABS: 650.0000 mg | ORAL_TABLET | ORAL | Status: DC | PRN

## 2015-04-30 MED ORDER — VERAPAMIL HCL 2.5 MG/ML IV SOLN
INTRAVENOUS | Status: DC | PRN
  Administered 2015-04-30: 10 mL via INTRA_ARTERIAL

## 2015-04-30 MED ORDER — SODIUM CHLORIDE 0.9 % IV SOLN
INTRAVENOUS | Status: DC | PRN
  Administered 2015-04-30: 247.5 mL via INTRAVENOUS

## 2015-04-30 MED ORDER — HEPARIN BOLUS VIA INFUSION
4000.0000 [IU] | Freq: Once | INTRAVENOUS | Status: AC
  Administered 2015-04-30: 4000 [IU] via INTRAVENOUS
  Filled 2015-04-30: qty 4000

## 2015-04-30 MED ORDER — HEART ATTACK BOUNCING BOOK
Freq: Once | Status: AC
  Administered 2015-04-30: 19:00:00
  Filled 2015-04-30: qty 1

## 2015-04-30 MED ORDER — NITROGLYCERIN IN D5W 200-5 MCG/ML-% IV SOLN
2.0000 ug/min | INTRAVENOUS | Status: DC
  Administered 2015-04-30: 5 ug/min via INTRAVENOUS
  Filled 2015-04-30: qty 250

## 2015-04-30 MED ORDER — LEVOTHYROXINE SODIUM 50 MCG PO TABS
50.0000 ug | ORAL_TABLET | Freq: Every day | ORAL | Status: DC
  Administered 2015-04-30 – 2015-05-01 (×2): 50 ug via ORAL
  Filled 2015-04-30 (×2): qty 1

## 2015-04-30 MED ORDER — ASPIRIN EC 81 MG PO TBEC
81.0000 mg | DELAYED_RELEASE_TABLET | Freq: Every day | ORAL | Status: DC
  Administered 2015-04-30: 81 mg via ORAL
  Filled 2015-04-30: qty 1

## 2015-04-30 MED ORDER — FENTANYL CITRATE (PF) 100 MCG/2ML IJ SOLN
INTRAMUSCULAR | Status: DC | PRN
  Administered 2015-04-30 (×3): 25 ug via INTRAVENOUS

## 2015-04-30 MED ORDER — VERAPAMIL HCL 2.5 MG/ML IV SOLN
INTRAVENOUS | Status: AC
  Filled 2015-04-30: qty 2

## 2015-04-30 MED ORDER — CALCIUM CARBONATE-VITAMIN D 500-200 MG-UNIT PO TABS
1.0000 | ORAL_TABLET | Freq: Every day | ORAL | Status: DC
  Administered 2015-04-30 – 2015-05-01 (×2): 1 via ORAL
  Filled 2015-04-30 (×2): qty 1

## 2015-04-30 MED ORDER — ENOXAPARIN SODIUM 40 MG/0.4ML ~~LOC~~ SOLN
40.0000 mg | SUBCUTANEOUS | Status: DC
  Administered 2015-05-01: 07:00:00 40 mg via SUBCUTANEOUS
  Filled 2015-04-30 (×2): qty 0.4

## 2015-04-30 MED ORDER — SODIUM CHLORIDE 0.9 % WEIGHT BASED INFUSION
1.0000 mL/kg/h | INTRAVENOUS | Status: AC
  Administered 2015-04-30: 1 mL/kg/h via INTRAVENOUS

## 2015-04-30 MED ORDER — SODIUM CHLORIDE 0.9% FLUSH: 3.0000 mL | Freq: Two times a day (BID) | INTRAVENOUS | Status: DC

## 2015-04-30 MED ORDER — ANGIOPLASTY BOOK
Freq: Once | Status: AC
  Administered 2015-04-30: 19:00:00
  Filled 2015-04-30: qty 1

## 2015-04-30 MED ORDER — HEPARIN (PORCINE) IN NACL 2-0.9 UNIT/ML-% IJ SOLN
INTRAMUSCULAR | Status: AC
  Filled 2015-04-30: qty 1500

## 2015-04-30 MED ORDER — LIDOCAINE HCL (PF) 1 % IJ SOLN
INTRAMUSCULAR | Status: AC
  Filled 2015-04-30: qty 30

## 2015-04-30 MED ORDER — ASPIRIN 81 MG PO CHEW: 324.0000 mg | CHEWABLE_TABLET | ORAL | Status: DC

## 2015-04-30 MED ORDER — ONDANSETRON HCL 4 MG/2ML IJ SOLN: 4.0000 mg | Freq: Four times a day (QID) | INTRAMUSCULAR | Status: DC | PRN

## 2015-04-30 MED ORDER — SODIUM CHLORIDE 0.9 % IV SOLN
250.0000 mg | INTRAVENOUS | Status: DC | PRN
  Administered 2015-04-30: 1.75 mg/kg/h via INTRAVENOUS

## 2015-04-30 MED ORDER — ACETAMINOPHEN 325 MG PO TABS
650.0000 mg | ORAL_TABLET | ORAL | Status: DC | PRN
  Administered 2015-04-30: 650 mg via ORAL
  Filled 2015-04-30 (×2): qty 2

## 2015-04-30 MED ORDER — SODIUM CHLORIDE 0.9% FLUSH
3.0000 mL | Freq: Two times a day (BID) | INTRAVENOUS | Status: DC
  Administered 2015-04-30: 18:00:00 3 mL via INTRAVENOUS

## 2015-04-30 MED ORDER — CLOPIDOGREL BISULFATE 75 MG PO TABS
75.0000 mg | ORAL_TABLET | Freq: Every day | ORAL | Status: DC
  Administered 2015-05-01: 07:00:00 75 mg via ORAL
  Filled 2015-04-30: qty 1

## 2015-04-30 MED ORDER — HEPARIN SODIUM (PORCINE) 1000 UNIT/ML IJ SOLN
INTRAMUSCULAR | Status: DC | PRN
  Administered 2015-04-30: 4000 [IU] via INTRAVENOUS

## 2015-04-30 MED ORDER — HEPARIN (PORCINE) IN NACL 100-0.45 UNIT/ML-% IJ SOLN
800.0000 [IU]/h | INTRAMUSCULAR | Status: DC
  Administered 2015-04-30: 800 [IU]/h via INTRAVENOUS
  Filled 2015-04-30: qty 250

## 2015-04-30 MED ORDER — ASPIRIN 81 MG PO CHEW: 81.0000 mg | CHEWABLE_TABLET | ORAL | Status: DC

## 2015-04-30 MED ORDER — SODIUM CHLORIDE 0.9 % WEIGHT BASED INFUSION: 3.0000 mL/kg/h | INTRAVENOUS | Status: DC

## 2015-04-30 MED ORDER — IOPAMIDOL (ISOVUE-370) INJECTION 76%
INTRAVENOUS | Status: DC | PRN
  Administered 2015-04-30: 230 mL via INTRA_ARTERIAL

## 2015-04-30 MED ORDER — NITROGLYCERIN 1 MG/10 ML FOR IR/CATH LAB
INTRA_ARTERIAL | Status: AC
  Filled 2015-04-30: qty 10

## 2015-04-30 MED ORDER — MIDAZOLAM HCL 2 MG/2ML IJ SOLN
INTRAMUSCULAR | Status: AC
  Filled 2015-04-30: qty 2

## 2015-04-30 MED ORDER — HEPARIN (PORCINE) IN NACL 2-0.9 UNIT/ML-% IJ SOLN
INTRAMUSCULAR | Status: DC | PRN
  Administered 2015-04-30: 1500 mL

## 2015-04-30 MED ORDER — FENTANYL CITRATE (PF) 100 MCG/2ML IJ SOLN
INTRAMUSCULAR | Status: AC
  Filled 2015-04-30: qty 2

## 2015-04-30 MED ORDER — ASPIRIN 81 MG PO CHEW
81.0000 mg | CHEWABLE_TABLET | Freq: Every day | ORAL | Status: DC
  Administered 2015-05-01: 81 mg via ORAL
  Filled 2015-04-30: qty 1

## 2015-04-30 SURGICAL SUPPLY — 20 items
CATH EXPO 5F MPA-1 (CATHETERS) ×1 IMPLANT
CATH INFINITI 5 FR JL3.5 (CATHETERS) ×1 IMPLANT
CATH INFINITI 5FR AL1 (CATHETERS) ×1 IMPLANT
CATH INFINITI JR4 5F (CATHETERS) ×1 IMPLANT
DEVICE RAD COMP TR BAND LRG (VASCULAR PRODUCTS) ×1 IMPLANT
DEVICE SPIDERFX EMB PROT 5MM (WIRE) ×1 IMPLANT
GLIDESHEATH SLEND A-KIT 6F 22G (SHEATH) ×1 IMPLANT
GUIDE CATH RUNWAY 6FR MP1 (CATHETERS) ×1 IMPLANT
KIT ENCORE 26 ADVANTAGE (KITS) ×1 IMPLANT
KIT HEART LEFT (KITS) ×2 IMPLANT
PACK CARDIAC CATHETERIZATION (CUSTOM PROCEDURE TRAY) ×2 IMPLANT
SHEATH PINNACLE 6F 10CM (SHEATH) ×1 IMPLANT
STENT PROMUS PREM MR 4.0X12 (Permanent Stent) ×1 IMPLANT
STENT PROMUS PREM MR 4.0X32 (Permanent Stent) ×1 IMPLANT
TRANSDUCER W/STOPCOCK (MISCELLANEOUS) ×2 IMPLANT
TUBING CIL FLEX 10 FLL-RA (TUBING) ×2 IMPLANT
WIRE EMERALD 3MM-J .035X150CM (WIRE) ×1 IMPLANT
WIRE HI TORQ VERSACORE-J 145CM (WIRE) ×1 IMPLANT
WIRE RUNTHROUGH .014X180CM (WIRE) ×1 IMPLANT
WIRE SAFE-T 1.5MM-J .035X260CM (WIRE) ×1 IMPLANT

## 2015-04-30 NOTE — H&P (Signed)
History & Physical    Patient ID: LORIMAR SMALLS MRN: [id number], DOB/AGE: 80/13/1932   Admit date: 04/29/2015   Primary Physician: Leonard Downing, MD Primary Cardiologist: Wynonia Lawman  Patient Profile    [phone] with CAD s/p CABG with MV repair (2003) s/p inferior STEMI (07/2013) s/p PCI to SVG to PL, pAF, HFpEF, who presents with chest pain and borderline troponin.   Past Medical History    Past Medical History  Diagnosis Date  . S/P partial hysterectomy 1977  . Arthritis   . Coronary artery disease     a. s/p MI in 1980;  b. 2003 CABG x 3 (LIMA->LAD, VG->OM, VG->RPL);  c. 01/2009 Cath: 3/3 patent grafts, native 3VD, EF 60%.  . Hypothyroidism   . Hypertension   . PAF (paroxysmal atrial fibrillation) ([place])   . Chronic diastolic CHF (congestive heart failure) (HCC)     a. EF 60% by LV gram 01/2009.  Marland Kitchen GERD (gastroesophageal reflux disease)   . H/O mitral valve repair     a. 2003  . CAD (coronary artery disease), native coronary artery 07/14/2013    Catheterization August/2003 showing severe three-vessel disease and moderate mitral regurgitation 08/28/2001 CABG with internal mammary graft to LAD, vein graft to circumflex, and vein graft to posterolateral branch and mitral valve repair by Dr. 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 Patent internal mammary graft with competitive flow in the distal vessel, occluded right coronary artery, occluded circumflex, patent saphenous vein graft to circumflex with mild/moderate disease proximally, minimal LV damage   . Hyperlipidemia 07/14/2013    Intolerance to multiple statins including Crestor, pravastatin, and Lipitor     Past Surgical History  Procedure Laterality Date  . Appendectomy  1950  . Thyroidectomy  1968  . Abdominal hysterectomy  1977  . Cholecystectomy    . Bladder suspension      tac  . Carpal tunnel release      right and left  .  Knee arthroscopy      left  . Coronary artery bypass graft  2003  . Mitral valve repair  2003    with cabg  . Thyroidectomy, partial      left  . Cardiac catheterization  03,05,11  . Left heart catheterization with coronary angiogram N/A 07/12/2013    Procedure: LEFT HEART CATHETERIZATION WITH CORONARY ANGIOGRAM;  Surgeon: Jettie Booze, MD;  Location: [facility] CATH LAB;  Service: Cardiovascular;  Laterality: N/A;     Allergies  Allergies  Allergen Reactions  . Atorvastatin     Muscle ache  . Codeine Nausea And Vomiting  . Crestor [Rosuvastatin]     Muscle ache  . Pravastatin     Muscle ache  . Simvastatin     Muscle cramps   . Ticagrelor     Dyspnea     History of Present Illness    82F with CAD s/p CABG with MV repair (2003) s/p inferior STEMI (07/2013) s/p PCI to SVG to PL, pAF, HFpEF, who presents with chest pain and borderline troponin.  Ms. Gratton reports she was in her [facility] until Wednesday night when, after mopping the floor, she developed a chest tightness. It resolved after a few minutes and she went to bed. She woke up at 345am to use the bathroom. While walking back to bed, the pain returned. She took SL NTG x 1 but ultimately after resting for about an hour, the  tightness resolved. She did well for much of the day and went out to lunch in [place]. At 810pm, her CP returned as she was getting to bed. She called her son to who came over. The pain got to 10/10 in severity and radiated up both sides of her neck. The pain was similar to her July 2015  MI, just a bit less severe.   On arrival to the ER, she was hemodynamically stable. Labs were notable for K 3.9, Cr 0.73, TnI 0.04, POC TnI 0.04, WBC 8.9. CXR demonstrated no edema. ECG demonstrated NSR, 1st degree AV block, RBBB. Compared to prior on 07/21/14, old IMI is no longer evident. On my exam, she was still having c/o 7/10 CP.   Home Medications    Prior to Admission medications   Medication Sig Start Date End  Date Taking? Authorizing Provider  acetaminophen (TYLENOL) 500 MG tablet Take 500 mg by mouth every 6 (six) hours as needed for moderate pain.   Yes Historical Provider, MD  aspirin EC 81 MG tablet Take 81 mg by mouth daily.   Yes Historical Provider, MD  calcium-vitamin D (OSCAL WITH D) 500-200 MG-UNIT per tablet Take 1 tablet by mouth daily.   Yes Historical Provider, MD  clopidogrel (PLAVIX) 75 MG tablet Take 1 tablet (75 mg total) by mouth daily. 07/17/13  Yes Jacolyn Reedy, MD  levothyroxine (SYNTHROID, LEVOTHROID) 50 MCG tablet Take 50 mcg by mouth daily.   Yes Historical Provider, MD  losartan (COZAAR) 50 MG tablet Take 1 tablet (50 mg total) by mouth daily. 07/20/14  Yes Ripudeep Krystal Eaton, MD  magnesium oxide (MAG-OX) 400 MG tablet Take 250 mg by mouth daily.    Yes Historical Provider, MD  nitroGLYCERIN (NITROSTAT) 0.4 MG SL tablet Place 0.4 mg under the tongue every 5 (five) minutes as needed for chest pain.   Yes Historical Provider, MD  Polyethyl Glycol-Propyl Glycol (SYSTANE) 0.4-0.3 % SOLN Apply 1 drop to eye daily as needed (FOR DRY EYES).   Yes Historical Provider, MD  ranitidine (ZANTAC) 150 MG tablet Take 150 mg by mouth daily as needed for heartburn.    Yes Historical Provider, MD    Family History    Family History  Problem Relation Age of Onset  . Other      no premature CAD.    Social History    Social History   Social History  . Marital Status: Widowed    Spouse Name: N/A  . Number of Children: N/A  . Years of Education: N/A   Occupational History  . Not on file.   Social History Main Topics  . Smoking status: Former Smoker    Quit date: 06/06/1967  . Smokeless tobacco: Former Systems developer  . Alcohol Use: No  . Drug Use: No  . Sexual Activity: Not Currently   Other Topics Concern  . Not on file   Social History Narrative   Lives in [place].  Widowed.     Review of Systems    General:  No chills, fever, night sweats or weight changes.  Cardiovascular:   See HPI Dermatological: No rash, lesions/masses Respiratory: No cough, dyspnea Urologic: No hematuria, dysuria Abdominal:   No nausea, vomiting, diarrhea, bright red blood per rectum, melena, or hematemesis Neurologic:  No visual changes, wkns, changes in mental status. All other systems reviewed and are otherwise negative except as noted above.  Physical Exam    Blood pressure 127/56, pulse 92, temperature 98.4 F (36.9 C), temperature  source Oral, resp. rate 25, height 4\' 11"  (1.499 m), weight 83.915 kg (185 lb), SpO2 97 %.  General: Pleasant, older female in mild distress.  Psych: Normal affect. Neuro: Alert and oriented X 3. Moves all extremities spontaneously. HEENT: Normal  Neck: Supple without bruits or JVD. Lungs:  Resp regular and unlabored, CTA. Heart: RRR no s3, s4, or murmurs. Abdomen: Soft, non-tender, non-distended, BS + x 4.  Extremities: No clubbing, cyanosis or edema. DP/PT/Radials 2+ and equal bilaterally.  Labs    Troponin [facility] Test)  Recent Labs  04/29/15 2317  TROPIPOC 0.04    Recent Labs  04/29/15 2255  TROPONINI 0.04*   Lab Results  Component Value Date   WBC 8.9 04/29/2015   HGB 12.2 04/29/2015   HCT 36.1 04/29/2015   MCV 85.3 04/29/2015   PLT 175 04/29/2015    Recent Labs Lab 04/29/15 2255  NA 136  K 3.9  CL 102  CO2 21*  BUN 9  CREATININE 0.73  CALCIUM 9.3  GLUCOSE 126*   Lab Results  Component Value Date   CHOL 234* 07/13/2013   HDL 34* 07/13/2013   LDLCALC UNABLE TO CALCULATE IF TRIGLYCERIDE OVER 400 mg/dL 07/13/2013   TRIG 431* 07/13/2013   Lab Results  Component Value Date   DDIMER 3.10* 07/16/2013     Radiology Studies    Dg Chest 2 View  04/29/2015  CLINICAL DATA:  Chest pain and tachypnea EXAM: CHEST  2 VIEW COMPARISON:  July 19, 2014 FINDINGS: There is no edema or consolidation. Heart is mildly enlarged with pulmonary vascularity within normal limits. No adenopathy. Aorta is tortuous with calcification  in the arch region. Patient is status post coronary artery bypass grafting. No adenopathy. There is degenerative change in the thoracic spine. There is an azygos lobe on the right, an anatomic variant. IMPRESSION: No edema or consolidation. Heart mildly enlarged. Status post coronary artery bypass grafting. Electronically Signed   By: Lowella Grip III M.D.   On: 04/29/2015 22:46    ECG & Cardiac Imaging     NSR, 1st degree AV block, RBBB. Compared to prior on 07/21/14, old IMI is no longer evident.  Assessment & Plan    32F with CAD s/p CABG with MV repair (2003) s/p inferior STEMI (07/2013) s/p PCI to SVG to PL, pAF, HFpEF, who presents with chest pain and borderline troponin. Her CP syndrome (particularly with Tn) is very consistent with ACS.  She is high enough risk that it is reasonable to proceed directly to cath.   - plavix 75 (note: she has had dyspnea with ticagrelor in the past)  - ASA 81mg  - allergic to statins - no BB; atenolol previously stopped due to SSS and junctional bradycardia - UFH - cycle troponins - lipids, A1c - NPO for cardiac cath - cardiac rehab  Signed, Lamar Sprinkles, MD 04/30/2015, 3:06 AM

## 2015-04-30 NOTE — Interval H&P Note (Signed)
Cath Lab Visit (complete for each Cath Lab visit)  Clinical Evaluation Leading to the Procedure:   ACS: Yes.    Non-ACS:    Anginal Classification: CCS IV  Anti-ischemic medical therapy: Maximal Therapy (2 or more classes of medications)  Non-Invasive Test Results: No non-invasive testing performed  Prior CABG: Previous CABG      History and Physical Interval Note:  04/30/2015 9:24 AM  Heather [name]  has presented today for surgery, with the diagnosis of unstable angina  The various methods of treatment have been discussed with the patient and family. After consideration of risks, benefits and other options for treatment, the patient has consented to  Procedure(s): Left Heart Cath and Cors/Grafts Angiography (N/A) as a surgical intervention .  The patient's history has been reviewed, patient examined, no change in status, stable for surgery.  I have reviewed the patient's chart and labs.  Questions were answered to the patient's satisfaction.     Heather [name]

## 2015-04-30 NOTE — H&P (View-Only) (Signed)
Subjective:  Long history of CAD and had an inferior infarct treated with stenting of the vein graft in June 2015.  Began to have angina on Wednesday and had an episode yesterday earlier in the day and thought she could wait until Tuesday because of Easter but had progressive pain last night.  On-and-off pain all night but currently pain-free.  Objective:  Vital Signs in the last 24 hours: BP 121/69 mmHg  Pulse 84  Temp(Src) 98.3 F (36.8 C) (Oral)  Resp 8  Ht 4\' 11"  (1.499 m)  Wt 82.5 kg (181 lb 14.1 oz)  BMI 36.72 kg/m2  SpO2 99%  Physical Exam: Elderly pleasant white female in no acute distress Lungs:  Clear Cardiac:  Regular rhythm, normal S1 and S2, no S3 Extremities:  No edema present  Intake/Output from previous day:    Weight [facility] Weights   04/29/15 2214 04/30/15 0525  Weight: 83.915 kg (185 lb) 82.5 kg (181 lb 14.1 oz)    Lab Results: Basic Metabolic Panel:  Recent Labs  04/29/15 2255 04/30/15 0548  NA 136 137  K 3.9 4.1  CL 102 102  CO2 21* 23  GLUCOSE 126* 141*  BUN 9 11  CREATININE 0.73 0.64   CBC:  Recent Labs  04/29/15 2255 04/30/15 0548  WBC 8.9 9.3  HGB 12.2 11.2*  HCT 36.1 33.6*  MCV 85.3 85.5  PLT 175 190   Cardiac Enzymes: Troponin (Point of Care Test)  Recent Labs  04/29/15 2317  TROPIPOC 0.04   Cardiac Panel (last 3 results)  Recent Labs  04/29/15 2255 04/30/15 0548  TROPONINI 0.04* 0.94*    Telemetry: Sinus rhythm  Assessment/Plan:  1.  Non-STEMI with recent accelerating angina 2.  Coronary artery disease with previous bypass grafting with previous stenting of the graft to the right coronary artery 3.  Statin intolerance 4.  Bifascicular block  Recommendations:  The patient is currently pain-free.  Currently going to the cath lab because of non-STEMI and intermittent pain overnight.Cardiac catheterization was discussed with the patient fully including risks of myocardial infarction, death, stroke, bleeding,  arrhythmia, dye allergy, renal insufficiency or bleeding.  The patient understands and is willing to proceed. Possibility of stenting also discussed with patient and she is agreeable.  Appreciate care of [facility] heart care.  I will be away over Easter.   Kerry Hough  MD [facility]  04/30/2015, 8:45 AM

## 2015-04-30 NOTE — Progress Notes (Signed)
Site area: right groin  Site Prior to Removal:  Level 0  Pressure Applied For 20 MINUTES    Minutes Beginning at 1335  Manual:   Yes.    Patient Status During Pull:  stable  Post Pull Groin Site:  Level 0  Post Pull Instructions Given:  Yes.    Post Pull Pulses Present:  Yes.    Dressing Applied:  Yes.    Comments:

## 2015-04-30 NOTE — Progress Notes (Signed)
Subjective:  Long history of CAD and had an inferior infarct treated with stenting of the vein graft in June 2015.  Began to have angina on Wednesday and had an episode yesterday earlier in the day and thought she could wait until Tuesday because of Easter but had progressive pain last night.  On-and-off pain all night but currently pain-free.  Objective:  Vital Signs in the last 24 hours: BP 121/69 mmHg  Pulse 84  Temp(Src) 98.3 F (36.8 C) (Oral)  Resp 8  Ht 4\' 11"  (1.499 m)  Wt 82.5 kg (181 lb 14.1 oz)  BMI 36.72 kg/m2  SpO2 99%  Physical Exam: Elderly pleasant white female in no acute distress Lungs:  Clear Cardiac:  Regular rhythm, normal S1 and S2, no S3 Extremities:  No edema present  Intake/Output from previous day:    Weight [facility] Weights   04/29/15 2214 04/30/15 0525  Weight: 83.915 kg (185 lb) 82.5 kg (181 lb 14.1 oz)    Lab Results: Basic Metabolic Panel:  Recent Labs  04/29/15 2255 04/30/15 0548  NA 136 137  K 3.9 4.1  CL 102 102  CO2 21* 23  GLUCOSE 126* 141*  BUN 9 11  CREATININE 0.73 0.64   CBC:  Recent Labs  04/29/15 2255 04/30/15 0548  WBC 8.9 9.3  HGB 12.2 11.2*  HCT 36.1 33.6*  MCV 85.3 85.5  PLT 175 190   Cardiac Enzymes: Troponin (Point of Care Test)  Recent Labs  04/29/15 2317  TROPIPOC 0.04   Cardiac Panel (last 3 results)  Recent Labs  04/29/15 2255 04/30/15 0548  TROPONINI 0.04* 0.94*    Telemetry: Sinus rhythm  Assessment/Plan:  1.  Non-STEMI with recent accelerating angina 2.  Coronary artery disease with previous bypass grafting with previous stenting of the graft to the right coronary artery 3.  Statin intolerance 4.  Bifascicular block  Recommendations:  The patient is currently pain-free.  Currently going to the cath lab because of non-STEMI and intermittent pain overnight.Cardiac catheterization was discussed with the patient fully including risks of myocardial infarction, death, stroke, bleeding,  arrhythmia, dye allergy, renal insufficiency or bleeding.  The patient understands and is willing to proceed. Possibility of stenting also discussed with patient and she is agreeable.  Appreciate care of [facility] heart care.  I will be away over Easter.   Kerry Hough  MD [facility]  04/30/2015, 8:45 AM

## 2015-04-30 NOTE — Progress Notes (Signed)
CRITICAL VALUE ALERT  Critical value received:  Troponin I  Date of notification:  04/30/15  Time of notification:  1300  Critical value read back:Yes.    Nurse who received alert:  Chales Salmon  MD notified (1st page):  Daneen Schick  Time of first page:  1300  MD notified (2nd page):  Time of second page:  Responding MD:  Daneen Schick  Time MD responded:  [phone]

## 2015-04-30 NOTE — Progress Notes (Signed)
TR BAND REMOVAL  LOCATION:  left radial  DEFLATED PER PROTOCOL:  Yes.    TIME BAND OFF / DRESSING APPLIED:   1600   SITE UPON ARRIVAL:   Level 0  SITE AFTER BAND REMOVAL:  Level 0  CIRCULATION SENSATION AND MOVEMENT:  Within Normal Limits  Yes.    COMMENTS:    

## 2015-04-30 NOTE — Progress Notes (Addendum)
ANTICOAGULATION CONSULT NOTE - Initial Consult  Pharmacy Consult for heparin Indication: chest pain/ACS  Allergies  Allergen Reactions  . Atorvastatin     Muscle ache  . Codeine Nausea And Vomiting  . Crestor [Rosuvastatin]     Muscle ache  . Pravastatin     Muscle ache  . Simvastatin     Muscle cramps   . Ticagrelor     Dyspnea     Patient Measurements: Height: 4\' 11"  (149.9 cm) Weight: 185 lb (83.915 kg) IBW/kg (Calculated) : 43.2  Vital Signs: Temp: 98.4 F (36.9 C) (04/13 2214) Temp Source: Oral (04/13 2214) BP: 127/56 mmHg (04/14 0300) Pulse Rate: 92 (04/14 0300)  Labs:  Recent Labs  04/29/15 2255  HGB 12.2  HCT 36.1  PLT 175  LABPROT 14.1  INR 1.07  CREATININE 0.73  TROPONINI 0.04*   Assessment: 72 yoF with hx of MI admitted with CP. Pharmacy to assist with heparin dosing. No known anticoagulation PTA. Baseline CBC stable.   Goal of Therapy:  Heparin level 0.3-0.7 units/ml Monitor platelets by anticoagulation protocol: Yes   Plan:  1. Give 4000 units bolus x 1 2. Start heparin infusion at 800 units/hr 3. Check anti-Xa level in 6 hours and daily while on heparin 4. Continue to monitor H&H and platelets   Vincenza Hews, PharmD, BCPS 04/30/2015, 3:37 AM Pager: [phone]

## 2015-04-30 NOTE — ED Notes (Signed)
Cardiology at bedside.

## 2015-05-01 DIAGNOSIS — I214 Non-ST elevation (NSTEMI) myocardial infarction: Secondary | ICD-10-CM

## 2015-05-01 LAB — CBC WITH DIFFERENTIAL/PLATELET
Basophils Absolute: 0 10*3/uL (ref 0.0–0.1)
Basophils Relative: 0 %
Eosinophils Absolute: 0.2 10*3/uL (ref 0.0–0.7)
Eosinophils Relative: 2 %
HCT: 31.9 % — ABNORMAL LOW (ref 36.0–46.0)
Hemoglobin: 10.5 g/dL — ABNORMAL LOW (ref 12.0–15.0)
Lymphocytes Relative: 13 %
Lymphs Abs: 1.1 10*3/uL (ref 0.7–4.0)
MCH: 28.3 pg (ref 26.0–34.0)
MCHC: 32.9 g/dL (ref 30.0–36.0)
MCV: 86 fL (ref 78.0–100.0)
Monocytes Absolute: 0.8 10*3/uL (ref 0.1–1.0)
Monocytes Relative: 10 %
Neutro Abs: 6.1 10*3/uL (ref 1.7–7.7)
Neutrophils Relative %: 75 %
Platelets: 168 10*3/uL (ref 150–400)
RBC: 3.71 MIL/uL — ABNORMAL LOW (ref 3.87–5.11)
RDW: 13.8 % (ref 11.5–15.5)
WBC: 8.2 10*3/uL (ref 4.0–10.5)

## 2015-05-01 LAB — BASIC METABOLIC PANEL
Anion gap: 10 (ref 5–15)
BUN: 8 mg/dL (ref 6–20)
CO2: 26 mmol/L (ref 22–32)
Calcium: 9.3 mg/dL (ref 8.9–10.3)
Chloride: 103 mmol/L (ref 101–111)
Creatinine, Ser: 0.96 mg/dL (ref 0.44–1.00)
GFR calc Af Amer: >60 mL/min (ref >60)
GFR calc non Af Amer: 53 mL/min — ABNORMAL LOW (ref >60)
Glucose, Bld: 113 mg/dL — ABNORMAL HIGH (ref 65–99)
Potassium: 4.2 mmol/L (ref 3.5–5.1)
Sodium: 139 mmol/L (ref 135–145)

## 2015-05-01 LAB — PLATELET INHIBITION P2Y12: Platelet Function  P2Y12: 239 [PRU] (ref 194–418)

## 2015-05-01 LAB — TROPONIN I: Troponin I: 1.76 ng/mL (ref <0.031)

## 2015-05-01 LAB — HEMOGLOBIN A1C
Hgb A1c MFr Bld: 5.6 % (ref 4.8–5.6)
Mean Plasma Glucose: 114 mg/dL

## 2015-05-01 NOTE — Progress Notes (Signed)
CARDIAC REHAB PHASE I   PRE:  Rate/Rhythm: 100 SR  BP:  Supine:   Sitting: 144/63  Standing:    SaO2:   MODE:  Ambulation: 275 ft   POST:  Rate/Rhythm: 106 SR  BP:  Supine:   Sitting: 149/82  Standing:    SaO2:  0750-0900 Assisted X 1 and used walker to ambulate. Gait steady with walker, left knee gave way with walking but she was able to catch herself Pt c/o of that she is not able to walk much any more and has had several falls. Pt states that she mostly walks in the house. She was able to walk 275 feet without c/o of cp or SOB. VS stable Pt to recliner after walk with call light in reach. Completed MI discharge education with pt. She voices understanding. Referral sent to [facility], although she states that she is not interested and feels that she can not do the exercises that she has tried it before.  Rodney Langton RN 05/01/2015 9:03 AM

## 2015-05-01 NOTE — Discharge Summary (Signed)
Physician Discharge Summary       Patient ID: Heather [name] MRN: [id number] DOB/AGE: [birth year] 82 y.o.  Admit date: 04/29/2015 Discharge date: 05/01/2015 Primary Cardiologist:  Dr. Wynonia Lawman   Discharge Diagnoses:  Principal Problem:   NSTEMI (non-ST elevated myocardial infarction) [facility]) Active Problems:   CAD (coronary artery disease) of artery bypass graft, s/p PCI stent to VG->RCA 04/30/15   Hyperlipidemia   Hypertensive heart disease   H/O mitral valve repair   Discharged Condition: good  Procedures: cardiac cath with Dr. Tamala Julian Procedures    Coronary Stent Intervention   Left Heart Cath and Cors/Grafts Angiography    Conclusion    1. Mid RCA lesion, 100% stenosed. 2. Dist RCA lesion, 100% stenosed. 3. Prox Cx lesion, 100% stenosed. 4. Mid LAD to Dist LAD lesion, 90% stenosed. 5. SVG was injected is normal in caliber, and is anatomically normal. 6. SVG was injected . 7. There is severe diffuse disease in the graft. 8. Origin to Prox Graft lesion, 100% stenosed. 9. LIMA was injected is normal in caliber, and is anatomically normal. 10. Mid Graft-2 lesion, 99% stenosed. 11. Mid Graft-1 lesion, 50% stenosed. Post intervention, there is a 0% residual stenosis. The lesion was previously treated with a stent (unknown type). 12. Prox Graft lesion, 70% stenosed. Post intervention, there is a 0% residual stenosis. 13. There is mild to moderate left ventricular systolic dysfunction.   Bypass graft occlusion total occlusion of the SVG to the circumflex, and high-grade obstruction in the SVG to the RCA.   The SVG to the RCA contains 50% in-stent restenosis, de novo 99% stenosis beyond the previously placed stent, and proximal intimal flap obstructing the vessel by 70%.  Widely patent LIMA to LAD  Severe diffuse high-grade disease in the proximal to mid LAD. Total occlusion of the proximal circumflex. Total occlusion of the mid native right coronary.  Severe inferior  basal hypokinesis. LVEF mildly reduced at 35-40%. Normal left ventricular end-diastolic pressure.  Successful multi-site stenting of the saphenous vein graft to the right coronary, with reduction in proximal stenosis from 70 to 0% and reduction of distal stenoses from 50 and 99% to 0%. Distal protection was used with a 5.0 mm Spider device. Copious atheromatous material was retrieved in the basket.   Recommendations:   The patient is allergic to and refuses statin therapy. We need to consider PCSK-9 therapy.  Continue Plavix. Check P2 Y 12 assay.      Hospital Course:   82F with CAD s/p CABG with MV repair (2003) s/p inferior STEMI (07/2013) s/p PCI to SVG to PL, pAF, HFpEF, who presented to ER 04/30/15 at 0300  with chest pain and borderline troponin. Heather [name] reported she was in her [facility] until Wednesday night when, after mopping the floor, she developed a chest tightness. It resolved after a few minutes and she went to bed. She woke up at 345am to use the bathroom. While walking back to bed, the pain returned. She took SL NTG x 1 but ultimately after resting for about an hour, the tightness resolved. She did well for much of the day and went out to lunch in [place]. At 810pm, her CP returned as she was getting to bed. She called her son to who came over. The pain got to 10/10 in severity and radiated up both sides of her neck. The pain was similar to her July 2015 MI, just a bit less severe.   She was seen and admitted + NSTEMI with  pk troponin 2.07 and follow up 1.76.  On EKG SR with 1st degree AV block and RBBB.    Post cath she has ambulated with rehab and has no recurrent chest pain.  We will draw P2Y12 prior to discharge.  At cath her EF was 35-45% which was down from 55-60%.  She was seen and evaluated by Dr. Lovena Le and found stable for discharge. We tried to add low dose BB-she refused she states in past they have caused severe leg cramps.  She is on an ARB.  Mild anemia should  improve.  She will follow up with Dr. Wynonia Lawman.  She does not plan to go to cardiac rehab.   Consults: None  Significant Diagnostic Studies:  BMP Latest Ref Rng 05/01/2015 04/30/2015 04/30/2015  Glucose 65 - 99 mg/dL 113(H) - 141(H)  BUN 6 - 20 mg/dL 8 - 11  Creatinine 0.44 - 1.00 mg/dL 0.96 0.81 0.64  Sodium 135 - 145 mmol/L 139 - 137  Potassium 3.5 - 5.1 mmol/L 4.2 - 4.1  Chloride 101 - 111 mmol/L 103 - 102  CO2 22 - 32 mmol/L 26 - 23  Calcium 8.9 - 10.3 mg/dL 9.3 - 9.1   CBC Latest Ref Rng 05/01/2015 04/30/2015 04/30/2015  WBC 4.0 - 10.5 K/uL 8.2 7.7 9.3  Hemoglobin 12.0 - 15.0 g/dL 10.5(L) 10.8(L) 11.2(L)  Hematocrit 36.0 - 46.0 % 31.9(L) 32.5(L) 33.6(L)  Platelets 150 - 400 K/uL 168 157 190   Troponin pk at 2.07 decreasing at d/c to 1.76.    CHEST 2 VIEW  COMPARISON: July 19, 2014  FINDINGS: There is no edema or consolidation. Heart is mildly enlarged with pulmonary vascularity within normal limits. No adenopathy. Aorta is tortuous with calcification in the arch region. Patient is status post coronary artery bypass grafting. No adenopathy. There is degenerative change in the thoracic spine. There is an azygos lobe on the right, an anatomic variant.  IMPRESSION: No edema or consolidation. Heart mildly enlarged. Status post coronary artery bypass grafting.      Discharge Exam: Blood pressure 112/51, pulse 93, temperature 98.6 F (37 C), temperature source Oral, resp. rate 17, height 4\' 11"  (1.499 m), weight 181 lb 14.1 oz (82.5 kg), SpO2 95 %.  Disposition: 01-Home or Self Care     Medication List    TAKE these medications        acetaminophen 500 MG tablet  Commonly known as:  TYLENOL  Take 500 mg by mouth every 6 (six) hours as needed for moderate pain.     aspirin EC 81 MG tablet  Take 81 mg by mouth daily.     calcium-vitamin D 500-200 MG-UNIT tablet  Commonly known as:  OSCAL WITH D  Take 1 tablet by mouth daily.     clopidogrel 75 MG tablet   Commonly known as:  PLAVIX  Take 1 tablet (75 mg total) by mouth daily.     levothyroxine 50 MCG tablet  Commonly known as:  SYNTHROID, LEVOTHROID  Take 50 mcg by mouth daily.     losartan 50 MG tablet  Commonly known as:  COZAAR  Take 1 tablet (50 mg total) by mouth daily.     magnesium oxide 400 MG tablet  Commonly known as:  MAG-OX  Take 250 mg by mouth daily.     nitroGLYCERIN 0.4 MG SL tablet  Commonly known as:  NITROSTAT  Place 0.4 mg under the tongue every 5 (five) minutes as needed for chest pain.  ranitidine 150 MG tablet  Commonly known as:  ZANTAC  Take 150 mg by mouth daily as needed for heartburn.     SYSTANE 0.4-0.3 % Soln  Generic drug:  Polyethyl Glycol-Propyl Glycol  Apply 1 drop to eye daily as needed (FOR DRY EYES).       Follow-up Information    Follow up with Ezzard Standing, MD.   Specialty:  Cardiology   Why:  call Monday for date and time   Contact information:   [place] [address] [phone]        Discharge Instructions: Call [facility] at [phone] or Dr. Thurman Coyer office if any bleeding, swelling or drainage at cath site.  May shower, no tub baths for 48 hours for groin sticks. No lifting over 5 pounds for 10days.  No Driving for 7 days if you drive.  Heart Healthy diet with decreased salt.  Continue Plavix and ASA for your new stent.  ADDENDUM:  Pt's P2Y12 is 239 outside of acceptable range for new DES with Plavix.  Discussed with internationalist on call Dr. Gwenlyn Found and with pt's severe intolerance to Brilinta with SOB we will keep her on Plavix.  She is at high risk for effient due to age. Will send message to Dr. Wynonia Lawman, her primary cardiologist,  for him to weigh in on decision.  Pt notified.     Signed: Isaiah Serge Nurse Practitioner-Certified [facility]: [facility]ART[facility] 05/01/2015, 9:16 AM  Time spent on discharge : 30 minutes.    Cardiology  Attending  Patient seen and examined. She is stable for discharge. Note above result of P2Y12 of 239. I asked Dr. Gwenlyn Found to weigh in on this as well.  Mikle Bosworth.D.

## 2015-05-01 NOTE — Discharge Instructions (Signed)
Call [facility] at [phone] or Dr. Thurman Coyer office if any bleeding, swelling or drainage at cath site.  May shower, no tub baths for 48 hours for groin sticks. No lifting over 5 pounds for 10days.  No Driving for 7 days if you drive.  Heart Healthy diet with decreased salt.  Continue Plavix and ASA for your new stent.

## 2015-05-01 NOTE — Progress Notes (Signed)
Pt of Dr. Wynonia Lawman  56F with CAD s/p CABG with MV repair (2003) s/p inferior STEMI (07/2013) s/p PCI to SVG to PL, pAF, HFpEF, who presents with chest pain and borderline troponin admitted with significant chest pain.  NSTEMI -- troponin 2.7 last pm.   Coronary Stent Intervention    Left Heart Cath and Cors/Grafts Angiography    Conclusion    1. Mid RCA lesion, 100% stenosed. 2. Dist RCA lesion, 100% stenosed. 3. Prox Cx lesion, 100% stenosed. 4. Mid LAD to Dist LAD lesion, 90% stenosed. 5. SVG was injected is normal in caliber, and is anatomically normal. 6. SVG was injected . 7. There is severe diffuse disease in the graft. 8. Origin to Prox Graft lesion, 100% stenosed. 9. LIMA was injected is normal in caliber, and is anatomically normal. 10. Mid Graft-2 lesion, 99% stenosed. 11. Mid Graft-1 lesion, 50% stenosed. Post intervention, there is a 0% residual stenosis. The lesion was previously treated with a stent (unknown type). 12. Prox Graft lesion, 70% stenosed. Post intervention, there is a 0% residual stenosis. 13. There is mild to moderate left ventricular systolic dysfunction.   Bypass graft occlusion total occlusion of the SVG to the circumflex, and high-grade obstruction in the SVG to the RCA.   The SVG to the RCA contains 50% in-stent restenosis, de novo 99% stenosis beyond the previously placed stent, and proximal intimal flap obstructing the vessel by 70%.  Widely patent LIMA to LAD  Severe diffuse high-grade disease in the proximal to mid LAD. Total occlusion of the proximal circumflex. Total occlusion of the mid native right coronary.  Severe inferior basal hypokinesis. LVEF mildly reduced at 35-40%. Normal left ventricular end-diastolic pressure.  Successful multi-site stenting of the saphenous vein graft to the right coronary, with reduction in proximal stenosis from 70 to 0% and reduction of distal stenoses from 50 and 99% to 0%. Distal protection was  used with a 5.0 mm Spider device. Copious atheromatous material was retrieved in the basket.   Recommendations:   The patient is allergic to and refuses statin therapy. We need to consider PCSK-9 therapy.  Continue Plavix. Check P2 Y 12 assay.      Subjective: No chest pain.   Objective: Vital signs in last 24 hours: Temp:  [98.2 F (36.8 C)-99.1 F (37.3 C)] 98.6 F (37 C) (04/15 0600) Pulse Rate:  [0-93] 93 (04/14 1912) Resp:  [0-25] 17 (04/15 0600) BP: (99-155)/(39-112) 112/51 mmHg (04/15 0600) SpO2:  [0 %-100 %] 95 % (04/15 0600) Weight change:  Last BM Date: 04/29/15 Intake/Output from previous day: -880 04/14 0701 - 04/15 0700 In: 920 [P.O.:370; I.V.:550] Out: 1800 [Urine:1800] Intake/Output this shift:    PE: General:Pleasant affect, NAD Skin:Warm and dry, brisk capillary refill HEENT:normocephalic, sclera clear, mucus membranes moist Neck:supple, no JVD, no bruits  Heart:S1S2 RRR without murmur, gallup, rub or click Lungs:clear without rales, rhonchi, or wheezes [id number], non tender, + BS, do not palpate liver spleen or masses Ext:no lower ext edema, 2+ pedal pulses, 2+ radial pulses Neuro:alert and oriented, MAE, follows commands, + facial symmetry Tele: SR with PACs 1sgt degree AV block.    Lab Results:  Recent Labs  04/30/15 1248 05/01/15 0531  WBC 7.7 8.2  HGB 10.8* 10.5*  HCT 32.5* 31.9*  PLT 157 168   BMET  Recent Labs  04/30/15 0548 04/30/15 1248 05/01/15 0531  NA 137  --  139  K 4.1  --  4.2  CL  102  --  103  CO2 23  --  26  GLUCOSE 141*  --  113*  BUN 11  --  8  CREATININE 0.64 0.81 0.96  CALCIUM 9.1  --  9.3    Recent Labs  04/30/15 1248 04/30/15 1655  TROPONINI 1.54* 2.07*    Lab Results  Component Value Date   CHOL 180 04/30/2015   HDL 27* 04/30/2015   LDLCALC 103* 04/30/2015   TRIG 249* 04/30/2015   CHOLHDL 6.7 04/30/2015   Lab Results  Component Value Date   HGBA1C 5.6 04/30/2015     Lab Results    Component Value Date   TSH 2.949 07/19/2014    Hepatic Function Panel No results for input(s): PROT, ALBUMIN, AST, ALT, ALKPHOS, BILITOT, BILIDIR, IBILI in the last 72 hours.  Recent Labs  04/30/15 0548  CHOL 180   No results for input(s): PROTIME in the last 72 hours.     Studies/Results: Dg Chest 2 View  04/29/2015  CLINICAL DATA:  Chest pain and tachypnea EXAM: CHEST  2 VIEW COMPARISON:  July 19, 2014 FINDINGS: There is no edema or consolidation. Heart is mildly enlarged with pulmonary vascularity within normal limits. No adenopathy. Aorta is tortuous with calcification in the arch region. Patient is status post coronary artery bypass grafting. No adenopathy. There is degenerative change in the thoracic spine. There is an azygos lobe on the right, an anatomic variant. IMPRESSION: No edema or consolidation. Heart mildly enlarged. Status post coronary artery bypass grafting. Electronically Signed   By: Lowella Grip III M.D.   On: 04/29/2015 22:46    Medications: I have reviewed the patient's current medications. Scheduled Meds: . aspirin  81 mg Oral Daily  . calcium-vitamin D  1 tablet Oral Daily  . clopidogrel  75 mg Oral Q breakfast  . enoxaparin (LOVENOX) injection  40 mg Subcutaneous Q24H  . famotidine  20 mg Oral BID  . levothyroxine  50 mcg Oral QAC breakfast  . losartan  50 mg Oral Daily  . magnesium oxide  200 mg Oral Daily  . nitroGLYCERIN  1 inch Topical 4 times per day  . sodium chloride flush  3 mL Intravenous Q12H   Continuous Infusions: . nitroGLYCERIN Stopped (04/30/15 0939)   PRN Meds:.sodium chloride, acetaminophen, morphine injection, nitroGLYCERIN, ondansetron (ZOFRAN) IV, sodium chloride flush  Assessment/Plan: Principal Problem:   NSTEMI (non-ST elevated myocardial infarction) [facility]) Active Problems:   CAD (coronary artery disease) of artery bypass graft, s/p PCI stent to VG->RCA 04/30/15   Hyperlipidemia   Hypertensive heart disease   H/O  mitral valve repair  S/p stent to VG-RCA yesterday, troponin climbing at 2.7 late pm.  will recheck today.  Intolerant to statins. BP stable this AM.   LOS: 1 day   Time spent with pt. : 15 minutes. [facility] R  Nurse Practitioner Certified Pager [phone] or after 5pm and on weekends call [phone] 05/01/2015, 8:13 AM  Cardiology Attending  Patient seen and examined. Agree with the findings as noted above. She is stable for discharge. Follouwp with Dr. Wynonia Lawman. Note rec's to check platelet assay.  Mikle Bosworth.D.

## 2015-05-03 MED FILL — Nitroglycerin IV Soln 100 MCG/ML in D5W: INTRA_ARTERIAL | Qty: 10 | Status: AC

## 2015-08-23 ENCOUNTER — Other Ambulatory Visit: Payer: Self-pay | Admitting: Family Medicine

## 2015-08-23 DIAGNOSIS — R070 Pain in throat: Secondary | ICD-10-CM

## 2015-08-24 ENCOUNTER — Encounter (HOSPITAL_COMMUNITY): Payer: Self-pay | Admitting: *Deleted

## 2015-08-25 ENCOUNTER — Other Ambulatory Visit: Payer: Medicare Other

## 2015-08-26 ENCOUNTER — Ambulatory Visit
Admission: RE | Admit: 2015-08-26 | Discharge: 2015-08-26 | Disposition: A | Discharge status: Home / Self Care | Payer: Medicare Other | Source: Ambulatory Visit | Attending: Family Medicine | Admitting: Family Medicine

## 2015-08-26 ENCOUNTER — Other Ambulatory Visit: Payer: Self-pay | Admitting: Family Medicine

## 2015-08-26 DIAGNOSIS — R52 Pain, unspecified: Secondary | ICD-10-CM

## 2015-08-26 DIAGNOSIS — R070 Pain in throat: Secondary | ICD-10-CM

## 2016-01-03 ENCOUNTER — Observation Stay (HOSPITAL_COMMUNITY)
Admission: EM | Admit: 2016-01-03 | Discharge: 2016-01-07 | Disposition: A | Discharge status: Home / Self Care | Payer: Medicare Other | Attending: Internal Medicine | Admitting: Internal Medicine

## 2016-01-03 ENCOUNTER — Encounter (HOSPITAL_COMMUNITY): Payer: Self-pay | Admitting: Emergency Medicine

## 2016-01-03 ENCOUNTER — Emergency Department (HOSPITAL_COMMUNITY): Payer: Medicare Other

## 2016-01-03 DIAGNOSIS — R778 Other specified abnormalities of plasma proteins: Secondary | ICD-10-CM | POA: Diagnosis not present

## 2016-01-03 DIAGNOSIS — R4701 Aphasia: Secondary | ICD-10-CM | POA: Insufficient documentation

## 2016-01-03 DIAGNOSIS — I110 Hypertensive heart disease with heart failure: Secondary | ICD-10-CM | POA: Insufficient documentation

## 2016-01-03 DIAGNOSIS — Z6836 Body mass index (BMI) 36.0-36.9, adult: Secondary | ICD-10-CM | POA: Diagnosis not present

## 2016-01-03 DIAGNOSIS — F419 Anxiety disorder, unspecified: Secondary | ICD-10-CM

## 2016-01-03 DIAGNOSIS — M1990 Unspecified osteoarthritis, unspecified site: Secondary | ICD-10-CM | POA: Diagnosis not present

## 2016-01-03 DIAGNOSIS — R0600 Dyspnea, unspecified: Secondary | ICD-10-CM | POA: Diagnosis not present

## 2016-01-03 DIAGNOSIS — R7989 Other specified abnormal findings of blood chemistry: Secondary | ICD-10-CM | POA: Insufficient documentation

## 2016-01-03 DIAGNOSIS — I440 Atrioventricular block, first degree: Secondary | ICD-10-CM | POA: Insufficient documentation

## 2016-01-03 DIAGNOSIS — E039 Hypothyroidism, unspecified: Secondary | ICD-10-CM

## 2016-01-03 DIAGNOSIS — R251 Tremor, unspecified: Secondary | ICD-10-CM

## 2016-01-03 DIAGNOSIS — T82855A Stenosis of coronary artery stent, initial encounter: Secondary | ICD-10-CM | POA: Diagnosis not present

## 2016-01-03 DIAGNOSIS — I214 Non-ST elevation (NSTEMI) myocardial infarction: Secondary | ICD-10-CM

## 2016-01-03 DIAGNOSIS — Y712 Prosthetic and other implants, materials and accessory cardiovascular devices associated with adverse incidents: Secondary | ICD-10-CM | POA: Diagnosis not present

## 2016-01-03 DIAGNOSIS — I340 Nonrheumatic mitral (valve) insufficiency: Secondary | ICD-10-CM | POA: Insufficient documentation

## 2016-01-03 DIAGNOSIS — I10 Essential (primary) hypertension: Secondary | ICD-10-CM | POA: Diagnosis present

## 2016-01-03 DIAGNOSIS — I2582 Chronic total occlusion of coronary artery: Secondary | ICD-10-CM | POA: Diagnosis not present

## 2016-01-03 DIAGNOSIS — Z955 Presence of coronary angioplasty implant and graft: Secondary | ICD-10-CM

## 2016-01-03 DIAGNOSIS — E7849 Other hyperlipidemia: Secondary | ICD-10-CM

## 2016-01-03 DIAGNOSIS — K219 Gastro-esophageal reflux disease without esophagitis: Secondary | ICD-10-CM | POA: Diagnosis not present

## 2016-01-03 DIAGNOSIS — Z66 Do not resuscitate: Secondary | ICD-10-CM

## 2016-01-03 DIAGNOSIS — E785 Hyperlipidemia, unspecified: Secondary | ICD-10-CM | POA: Insufficient documentation

## 2016-01-03 DIAGNOSIS — Z951 Presence of aortocoronary bypass graft: Secondary | ICD-10-CM

## 2016-01-03 DIAGNOSIS — I2510 Atherosclerotic heart disease of native coronary artery without angina pectoris: Secondary | ICD-10-CM | POA: Diagnosis not present

## 2016-01-03 DIAGNOSIS — I5032 Chronic diastolic (congestive) heart failure: Secondary | ICD-10-CM | POA: Diagnosis not present

## 2016-01-03 DIAGNOSIS — E669 Obesity, unspecified: Secondary | ICD-10-CM | POA: Diagnosis not present

## 2016-01-03 DIAGNOSIS — Z7982 Long term (current) use of aspirin: Secondary | ICD-10-CM

## 2016-01-03 DIAGNOSIS — M546 Pain in thoracic spine: Secondary | ICD-10-CM | POA: Diagnosis not present

## 2016-01-03 DIAGNOSIS — Z87891 Personal history of nicotine dependence: Secondary | ICD-10-CM

## 2016-01-03 DIAGNOSIS — F411 Generalized anxiety disorder: Secondary | ICD-10-CM | POA: Diagnosis not present

## 2016-01-03 DIAGNOSIS — Z79899 Other long term (current) drug therapy: Secondary | ICD-10-CM

## 2016-01-03 DIAGNOSIS — I4510 Unspecified right bundle-branch block: Secondary | ICD-10-CM | POA: Diagnosis not present

## 2016-01-03 DIAGNOSIS — I480 Paroxysmal atrial fibrillation: Secondary | ICD-10-CM | POA: Insufficient documentation

## 2016-01-03 DIAGNOSIS — I25810 Atherosclerosis of coronary artery bypass graft(s) without angina pectoris: Principal | ICD-10-CM | POA: Insufficient documentation

## 2016-01-03 DIAGNOSIS — I252 Old myocardial infarction: Secondary | ICD-10-CM | POA: Diagnosis not present

## 2016-01-03 DIAGNOSIS — F410 Panic disorder [episodic paroxysmal anxiety] without agoraphobia: Secondary | ICD-10-CM | POA: Insufficient documentation

## 2016-01-03 DIAGNOSIS — Z885 Allergy status to narcotic agent status: Secondary | ICD-10-CM

## 2016-01-03 DIAGNOSIS — Z7902 Long term (current) use of antithrombotics/antiplatelets: Secondary | ICD-10-CM

## 2016-01-03 DIAGNOSIS — Z888 Allergy status to other drugs, medicaments and biological substances status: Secondary | ICD-10-CM

## 2016-01-03 HISTORY — DX: Dyspnea, unspecified: R06.00

## 2016-01-03 HISTORY — DX: Non-ST elevation (NSTEMI) myocardial infarction: I21.4

## 2016-01-03 HISTORY — DX: Panic disorder (episodic paroxysmal anxiety): F41.0

## 2016-01-03 HISTORY — DX: Aphasia: R47.01

## 2016-01-03 LAB — BASIC METABOLIC PANEL
Anion gap: 13 (ref 5–15)
BUN: 16 mg/dL (ref 6–20)
CO2: 22 mmol/L (ref 22–32)
Calcium: 9.9 mg/dL (ref 8.9–10.3)
Chloride: 103 mmol/L (ref 101–111)
Creatinine, Ser: 0.82 mg/dL (ref 0.44–1.00)
GFR calc Af Amer: >60 mL/min (ref >60)
GFR calc non Af Amer: >60 mL/min (ref >60)
Glucose, Bld: 120 mg/dL — ABNORMAL HIGH (ref 65–99)
Potassium: 3.9 mmol/L (ref 3.5–5.1)
Sodium: 138 mmol/L (ref 135–145)

## 2016-01-03 LAB — CBC
HCT: 36.3 % (ref 36.0–46.0)
HCT: 35.8 % — ABNORMAL LOW (ref 36.0–46.0)
Hemoglobin: 12.7 g/dL (ref 12.0–15.0)
Hemoglobin: 12.1 g/dL (ref 12.0–15.0)
MCH: 29.8 pg (ref 26.0–34.0)
MCH: 28.8 pg (ref 26.0–34.0)
MCHC: 35 g/dL (ref 30.0–36.0)
MCHC: 33.8 g/dL (ref 30.0–36.0)
MCV: 85.2 fL (ref 78.0–100.0)
MCV: 85.2 fL (ref 78.0–100.0)
Platelets: 191 10*3/uL (ref 150–400)
Platelets: 183 10*3/uL (ref 150–400)
RBC: 4.26 MIL/uL (ref 3.87–5.11)
RBC: 4.2 MIL/uL (ref 3.87–5.11)
RDW: 13.6 % (ref 11.5–15.5)
RDW: 13.7 % (ref 11.5–15.5)
WBC: 5.7 10*3/uL (ref 4.0–10.5)
WBC: 5.9 10*3/uL (ref 4.0–10.5)

## 2016-01-03 LAB — I-STAT TROPONIN, ED: Troponin i, poc: 0.05 ng/mL (ref 0.00–0.08)

## 2016-01-03 LAB — TROPONIN I
Troponin I: 0.14 ng/mL (ref <0.03)
Troponin I: 0.15 ng/mL (ref <0.03)
Troponin I: 0.1 ng/mL (ref <0.03)

## 2016-01-03 LAB — TSH: TSH: 0.74 u[IU]/mL (ref 0.350–4.500)

## 2016-01-03 MED ORDER — NITROGLYCERIN 0.4 MG SL SUBL: 0.4000 mg | SUBLINGUAL_TABLET | SUBLINGUAL | Status: DC | PRN

## 2016-01-03 MED ORDER — ACETAMINOPHEN 500 MG PO TABS: 500.0000 mg | ORAL_TABLET | Freq: Four times a day (QID) | ORAL | Status: DC | PRN

## 2016-01-03 MED ORDER — LEVOTHYROXINE SODIUM 50 MCG PO TABS
50.0000 ug | ORAL_TABLET | Freq: Every day | ORAL | Status: DC
  Administered 2016-01-04 – 2016-01-07 (×4): 50 ug via ORAL
  Filled 2016-01-03 (×4): qty 1

## 2016-01-03 MED ORDER — CLOPIDOGREL BISULFATE 75 MG PO TABS
75.0000 mg | ORAL_TABLET | Freq: Every day | ORAL | Status: DC
  Administered 2016-01-04 – 2016-01-07 (×4): 75 mg via ORAL
  Filled 2016-01-03 (×4): qty 1

## 2016-01-03 MED ORDER — ENOXAPARIN SODIUM 40 MG/0.4ML ~~LOC~~ SOLN
40.0000 mg | SUBCUTANEOUS | Status: DC
  Administered 2016-01-04 – 2016-01-05 (×2): 40 mg via SUBCUTANEOUS
  Filled 2016-01-03 (×4): qty 0.4

## 2016-01-03 MED ORDER — LORAZEPAM 2 MG/ML IJ SOLN
1.0000 mg | Freq: Once | INTRAMUSCULAR | Status: AC
  Administered 2016-01-03: 1 mg via INTRAVENOUS
  Filled 2016-01-03: qty 1

## 2016-01-03 MED ORDER — ASPIRIN 81 MG PO CHEW
234.0000 mg | CHEWABLE_TABLET | Freq: Once | ORAL | Status: AC
  Administered 2016-01-03: 243 mg via ORAL
  Filled 2016-01-03: qty 3

## 2016-01-03 MED ORDER — STROKE: EARLY STAGES OF RECOVERY BOOK
Freq: Once | Status: AC
  Administered 2016-01-03: 23:00:00
  Filled 2016-01-03: qty 1

## 2016-01-03 MED ORDER — LORAZEPAM 2 MG/ML IJ SOLN
0.5000 mg | Freq: Once | INTRAMUSCULAR | Status: AC | PRN
  Administered 2016-01-05: 0.5 mg via INTRAVENOUS
  Filled 2016-01-03: qty 1

## 2016-01-03 MED ORDER — LOSARTAN POTASSIUM 50 MG PO TABS
50.0000 mg | ORAL_TABLET | Freq: Every day | ORAL | Status: DC
  Administered 2016-01-03: 50 mg via ORAL
  Filled 2016-01-03: qty 1

## 2016-01-03 NOTE — Evaluation (Addendum)
Speech Language Pathology Evaluation Patient Details Name: Heather [name] MRN: [id number] DOB: 1930/06/29 Today's Date: 01/03/2016 Time: 2297-9892 SLP Time Calculation (min) (ACUTE ONLY): 15 min  Problem List:  Patient Active Problem List   Diagnosis Date Noted  . Dyspnea 01/03/2016  . Anxiety attack 01/03/2016  . PAF (paroxysmal atrial fibrillation) ([place]) 01/03/2016  . Aphasia   . Other hyperlipidemia   . NSTEMI (non-ST elevated myocardial infarction) ([place]) 04/30/2015  . Hypertension, uncontrolled 07/19/2014  . H/O mitral valve repair   . CAD (coronary artery disease) of artery bypass graft, s/p PCI stent to VG->RCA 04/30/15 07/14/2013  . Obesity (BMI 30-39.9) 07/14/2013  . Hyperlipidemia 07/14/2013  . Hypothyroidism 07/14/2013  . Hypertensive heart disease 07/14/2013   Past Medical History:  Past Medical History:  Diagnosis Date  . Arthritis   . CAD (coronary artery disease), native coronary artery 07/14/2013   Catheterization August/2003 showing severe three-vessel disease and moderate mitral regurgitation 08/28/2001 CABG with internal mammary graft to LAD, vein graft to circumflex, and vein graft to posterolateral branch and mitral valve repair by Dr. 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 Patent internal mammary graft with competitive flow in the distal vessel, occluded right coronary artery, occluded circumflex, patent saphenous vein graft to circumflex with mild/moderate disease proximally, minimal LV damage   . Chronic diastolic CHF (congestive heart failure) (HCC)    a. EF 60% by LV gram 01/2009.  . Coronary artery disease    a. s/p MI in 1980;  b. 2003 CABG x 3 (LIMA->LAD, VG->OM, VG->RPL);  c. 01/2009 Cath: 3/3 patent grafts, native 3VD, EF 60%.  Marland Kitchen GERD (gastroesophageal reflux disease)   . H/O mitral valve repair    a. 2003  . Hyperlipidemia 07/14/2013   Intolerance to multiple  statins including Crestor, pravastatin, and Lipitor   . Hypertension   . Hypothyroidism   . PAF (paroxysmal atrial fibrillation) ([place])   . S/P partial hysterectomy 1977   Past Surgical History:  Past Surgical History:  Procedure Laterality Date  . ABDOMINAL HYSTERECTOMY  1977  . APPENDECTOMY  1950  . BLADDER SUSPENSION     tac  . CARDIAC CATHETERIZATION  03,05,11  . CARDIAC CATHETERIZATION N/A 04/30/2015   Procedure: Left Heart Cath and Cors/Grafts Angiography;  Surgeon: Belva Crome, MD;  Location: [place] CV LAB;  Service: Cardiovascular;  Laterality: N/A;  . CARDIAC CATHETERIZATION N/A 04/30/2015   Procedure: Coronary Stent Intervention;  Surgeon: Belva Crome, MD;  Location: [employer] CV LAB;  Service: Cardiovascular;  Laterality: N/A;  Proximal and Distal SVG to the PLA  . CARPAL TUNNEL RELEASE     right and left  . CHOLECYSTECTOMY    . CORONARY ARTERY BYPASS GRAFT  2003  . KNEE ARTHROSCOPY     left  . LEFT HEART CATHETERIZATION WITH CORONARY ANGIOGRAM N/A 07/12/2013   Procedure: LEFT HEART CATHETERIZATION WITH CORONARY ANGIOGRAM;  Surgeon: Jettie Booze, MD;  Location: [facility] CATH LAB;  Service: Cardiovascular;  Laterality: N/A;  . MITRAL VALVE REPAIR  2003   with cabg  . THYROIDECTOMY  1968  . THYROIDECTOMY, PARTIAL     left   HPI:  Heather [name] is an 82 year old woman with a history of coronary artery disease status post coronary artery bypass graft 3 in 2003 and non-ST elevation MI in April 2017 requiring multiple stents, paroxysmal atrial fibrillation, hypertension, hyperlipidemia, gastroesophageal reflux disease, and the need for  mitral valve repair who was in her usual state of health until 3 weeks prior when she began developing intermittent pain between her shoulder blades. Her family states that when these would occur she would take her nitroglycerin and sit in a chair and the pain would resolve. This morning at around 5:30 she was awoken short of breath. Just  before 6:00 she called her daughter and related that she was short of breath and anxious. Her daughter recommended she call 911, which she did. Soon thereafter (just after 6 AM) she was unable to speak and was only able to stutter. Along with this stuttering was some shaking. EMS transferred her to the emergency department and she was admitted to the internal medicine teaching service for further evaluation and care. Apparently, she initially was felt to be having a panic attack. Of note, she has not been taking warfarin or a DOAC for her history of paroxysmal atrial fibrillation, but has been compliant with her Plavix and aspirin after her stenting in April. Her daughter states she has never seen her mother have a panic attack let alone an episode like this. According to the grandson, who works for the fire department in [place] and has accompanied her in ambulance rides, notes that she will sometimes have panic attacks like this. CT and MRI not available at this time.  Rapid response and code stroke called at 1415 d/t nonsensical stuttering.   Assessment / Plan / Recommendation Clinical Impression  Pt with expressive language difficulties characterized by nonsensical stuttering which impacts pt's ability to volitionally imitate any sounds. Pt doesn't display any oral gropping behaviors and is able to produce phonation during her nonsensical stuttering. Pt has ability to follow visual and verbal command. No neurological imaging available as MRI is pending. Per chart review, neurologist has not ruled out her nonsensicle stuttering as psychogenic. ST to continue following for further assessment and possible treatment to increase pt's ability to express herself.     SLP Assessment  Patient needs continued Speech Lanaguage Pathology Services    Follow Up Recommendations   (TBD)    Frequency and Duration min 2x/week  2 weeks      SLP Evaluation Cognition  Overall Cognitive Status:  (Difficult  to assess d/t nonsensical stutteriing) Arousal/Alertness: Awake/alert Orientation Level: Oriented to person Attention: Selective Selective Attention: Appears intact Awareness: Appears intact       Comprehension  Auditory Comprehension Overall Auditory Comprehension: Appears within functional limits for tasks assessed (simple information) Yes/No Questions: Within Functional Limits Commands: Within Functional Limits (Simple) Interfering Components:  (Frustration with nonsensical stuttering) Visual Recognition/Discrimination Discrimination: Not tested Reading Comprehension Reading Status: Not tested    Expression Expression Primary Mode of Expression: Verbal Verbal Expression Overall Verbal Expression: Impaired Initiation: Impaired Automatic Speech:  (All impaired by nonsensical stuttering) Repetition: Impaired Level of Impairment: Word level Naming: Not tested Pragmatics: Unable to assess Interfering Components: Speech intelligibility Effective Techniques:  (Unable to verbally imitate any sounds on command) Non-Verbal Means of Communication: Gestures (able to indicate yes/no with thumbs up/down) Written Expression Written Expression: Not tested   Oral / Motor  Oral Motor/Sensory Function Overall Oral Motor/Sensory Function: Within functional limits Motor Speech Overall Motor Speech: Impaired (Nonsensical stuttering) Respiration: Within functional limits Phonation: Normal Resonance: Within functional limits Articulation: Impaired (Nonsensical stuttering) Level of Impairment: Word Intelligibility: Intelligibility reduced (d/t language impairment) Word: 0-24% accurate Phrase: 0-24% accurate Conversation: Not tested Motor Planning: Impaired Level of Impairment: Systems developer Errors: Aware   GO  Functional Assessment Tool Used: Skilled clinical observations Functional Limitations: Spoken language expressive Spoken Language Expression Current Status  [phone]): 100 percent impaired, limited or restricted Spoken Language Expression Goal Status ([id number]): At least 20 percent but less than 40 percent impaired, limited or restricted         [name] 01/03/2016, 5:14 PM

## 2016-01-03 NOTE — ED Notes (Signed)
Pt calmer than on first arrival to room but continues to have intermittent increases in anxiety.

## 2016-01-03 NOTE — ED Triage Notes (Signed)
Pt from home via [place] with c/o SOB starting this morning.  Pt called her daughter upset and anxious.  Pt is now further upset because she feels like she has ruined going to her daughters house today per EMS.  Pt is shaking and stuttering, unable to speak sentences.  Pt does follow commands and shakes head yes and no appropriately.  Hx of 6 MI with stents.  Alert.

## 2016-01-03 NOTE — ED Triage Notes (Signed)
Pt took nitro and aspirin prior to EMS arrival per daughter.

## 2016-01-03 NOTE — H&P (Signed)
Date: 01/03/2016               Patient Name:  Heather [name] MRN: [id number]  DOB: 1930/12/19 Age / Sex: 82 y.o., female   PCP: Leonard Downing, MD         Medical Service: Internal Medicine Teaching Service         Attending Physician: Dr. Oval Linsey, MD    First Contact: Dr. Kalman Shan Pager: [phone]  Second Contact: Dr. Charlynn Grimes Pager: [phone]       After Hours (After 5p/  First Contact Pager: [phone]  weekends / holidays): Second Contact Pager: [phone]   Chief Complaint: dyspena  History of Present Illness:   82 yo f with Pafib, Hypothyroidism, HTN, HLD, GERD, CAD s/p CABG 2003, with NSTEMI April 2017 s/p multiple stenting  Presents with acute shortness of breathe and anxiety starting today. She has had intermittent pain between her shoulder blades for last 3 weeks (unable to tell me about whether exertional or any other qualifiers about the pain). Today around 5:30 she started feeling SOB and very anxious and called the family and the EMS. Initially her speech was fine but later she started stuttering and she is unable to produce a single word during my interview of the patient. Denies any chest pain, any weakness/numbness or tingling has chronic right sided hip weakness and pain. No previous strokes. Denies recent travels, calf tenderness. Not hypoxic, HR is normal at rest. POC trop normal. EKG showed RBBB with associated TW changes as seen previously. Patient is unable to provide further history currently so most of the history was provider by her family members including her medical POA (her daughter). She is DNR per daughter.  Took full dose asa and 1x nitro prior to coming in.   Meds:  Current Meds  Medication Sig  . acetaminophen (TYLENOL) 500 MG tablet Take 500 mg by mouth every 6 (six) hours as needed for moderate pain.  Marland Kitchen aspirin EC 81 MG tablet Take 81 mg by mouth daily.  . calcium-vitamin D (OSCAL WITH D) 500-200 MG-UNIT per tablet Take 1 tablet by  mouth daily.  . clopidogrel (PLAVIX) 75 MG tablet Take 1 tablet (75 mg total) by mouth daily.  Marland Kitchen levothyroxine (SYNTHROID, LEVOTHROID) 50 MCG tablet Take 50 mcg by mouth daily.  Marland Kitchen losartan (COZAAR) 50 MG tablet Take 1 tablet (50 mg total) by mouth daily.  . magnesium oxide (MAG-OX) 400 MG tablet Take 250 mg by mouth daily.   . nitroGLYCERIN (NITROSTAT) 0.4 MG SL tablet Place 0.4 mg under the tongue every 5 (five) minutes as needed for chest pain.  . ranitidine (ZANTAC) 150 MG tablet Take 150 mg by mouth daily as needed for heartburn.      Allergies: Allergies as of 01/03/2016 - Review Complete 01/03/2016  Allergen Reaction Noted  . Atorvastatin  07/14/2013  . Codeine Nausea And Vomiting 08/31/2010  . Crestor [rosuvastatin]  07/14/2013  . Pravastatin  07/14/2013  . Simvastatin  07/17/2013  . Ticagrelor  07/17/2013   Past Medical History:  Diagnosis Date  . Arthritis   . CAD (coronary artery disease), native coronary artery 07/14/2013   Catheterization August/2003 showing severe three-vessel disease and moderate mitral regurgitation 08/28/2001 CABG with internal mammary graft to LAD, vein graft to circumflex, and vein graft to posterolateral branch and mitral valve repair by Dr. Roxy Manns Inferior infarction 07/12/13 with occlusion of the vein graft to right coronary artery, stenting with a 3.518 mm resolute  stents. Patent internal mammary graft with competitive flow in the distal vessel, occluded right coronary artery, occluded circumflex, patent saphenous vein graft to circumflex with mild/moderate disease proximally, minimal LV damage   . Chronic diastolic CHF (congestive heart failure) (HCC)    a. EF 60% by LV gram 01/2009.  . Coronary artery disease    a. s/p MI in 1980;  b. 2003 CABG x 3 (LIMA->LAD, VG->OM, VG->RPL);  c. 01/2009 Cath: 3/3 patent grafts, native 3VD, EF 60%.  Marland Kitchen GERD (gastroesophageal reflux disease)   . H/O mitral valve repair    a. 2003  . Hyperlipidemia 07/14/2013    Intolerance to multiple statins including Crestor, pravastatin, and Lipitor   . Hypertension   . Hypothyroidism   . PAF (paroxysmal atrial fibrillation) ([place])   . S/P partial hysterectomy 1977    Family History:  Family History  Problem Relation Age of Onset  . Other      no premature CAD.     Social History:  Social History   Social History  . Marital status: Widowed    Spouse name: N/A  . Number of children: N/A  . Years of education: N/A   Social History Main Topics  . Smoking status: Former Smoker    Quit date: 06/06/1967  . Smokeless tobacco: Former Systems developer  . Alcohol use No  . Drug use: No  . Sexual activity: Not Currently   Other Topics Concern  . None   Social History Narrative   Lives in [place].  Widowed.     Review of Systems: A complete ROS was negative except as per HPI. Could not get further ROS due to patient not being able to talk.  Physical Exam: Blood pressure 122/62, pulse 90, temperature 98.1 F (36.7 C), temperature source Oral, resp. rate 21, SpO2 93 %.  Physical Exam  Constitutional: She appears well-developed and well-nourished. She appears distressed.  Very anxious. Having stuttering and unable to produce any word.   HENT:  Head: Normocephalic and atraumatic.  Mouth/Throat: Oropharynx is clear and moist.  Eyes: Conjunctivae and EOM are normal. Pupils are equal, round, and reactive to light. No scleral icterus.  Neck: Normal range of motion. No JVD present.  Cardiovascular: Normal rate and regular rhythm.  Exam reveals no gallop and no friction rub.   No murmur heard. Respiratory: Effort normal and breath sounds normal. No respiratory distress. She has no wheezes.  GI: Soft. Bowel sounds are normal. She exhibits no distension. There is no tenderness.  Musculoskeletal: Normal range of motion. She exhibits no edema or deformity.  Neurological: She is alert. No cranial nerve deficit.  Sensation intact b/l. No facial droop. Has left sided  hip weakness which is chronic per family. str 5/5 otherwise bilaterally. Has some trouble understanding multi step instructions. Unable to talk at all, having stuttering. Responds by nodding and pointing to things.   Tremors on both hands.   Skin: She is not diaphoretic.    EKG: RBBB with corresponding TW changes, no signs of ST depression or ST elevation.   CXR: poor inspiratory effort. Cardiomegaly. Tortuous thoracic aorta.   Assessment & Plan by Problem: Active Problems:   Hyperlipidemia   Hypertension, uncontrolled   Dyspnea   Anxiety attack   PAF (paroxysmal atrial fibrillation) (HCC)  Dyspnea - need to rule out ACS, unclear etiology Anxiety or panic attack (no clear etiology) vs atypical ACS presentation with intermittent shoulder blade pain. POC trop and EKG is not suggestive of ACS. Not hypoxic  or tachycardic at rest. Patient is high risk for ACS with her recent stent on April and previous CABG.  Low risk for PE as she is not hypoxic or tachycardic. [place] PE score of zero. No recent travel or signs of DVT. - will treat for possible anxiety with 1mg  ativan. -will check TSH. She is taking synthyroid so we will make sure she is not getting hyperthyroid.  - will do chest pain rule out with tropx3. ED has asked her cardiologist Dr. Wynonia Lawman to come see her. Will follow up further recs from him.  Expressive Aphasia Patient has been stuttering this morning around 6 AM. She is unable to produce any word. No other neurological changes. This could be from non-organic cause such as anxiety and panic disorder, however, I am also concerned about CVA with her hx of Paroxysmal Afib not on anticoag - I have requested ED physician for a neurological consult. F/up their recs.   Anxiety with possible panic attack  -unclear etiology. Does not have hx of anxiety. Will treat with once dose of ativan and monitor response. I don't think she needs medication long term and this is acute. We will need  search the cause of this.  CAD s/p CABG in the remote past and multiple stent on April 2017 Need to rule out ACS as above.  - cont asa, palvix. Not sure why not on bblocker.  - allergic to statin. PCSK-9 was recommended in the past. Need to discuss this further outpatient.   HTN - cont cozaar.  Hypothyroidism - on synthroid. - cont this, checks TSH to make sure she is not hyperthyroid.       Dispo: Admit patient to Observation with expected length of stay less than 2 midnights.  Signed: Dellia Nims, MD 01/03/2016, 12:28 PM  Pager: [phone]

## 2016-01-03 NOTE — Progress Notes (Signed)
Speech Language Pathology Treatment: Cognitive-Linquistic  Patient Details Name: Heather [name] MRN: [id number] DOB: Oct 09, 1930 Today's Date: 01/03/2016 Time: 6269-4854 SLP Time Calculation (min) (ACUTE ONLY): 20 min  Assessment / Plan / Recommendation Clinical Impression  Skilled treatment focused on speech-language goals. Pt presents with nonsensical stuttering c/b repetitive frontal sounds. Pt able to produce voicing during repetitive stuttering but even with Max multimodal cues could not volitional produce "ah" or imitate any sounds. Pt able to follow simple directions independently and indicate yes/no with gestures once introduced by SLP. Pt is alert and attentive. No groping oral movements present. MRI is pending and will await results for further direction for therapy tasks.    HPI HPI: Ms. Heather [name] is an 82 year old woman with a history of coronary artery disease status post coronary artery bypass graft 3 in 2003 and non-ST elevation MI in April 2017 requiring multiple stents, paroxysmal atrial fibrillation, hypertension, hyperlipidemia, gastroesophageal reflux disease, and the need for mitral valve repair who was in her usual state of health until 3 weeks prior when she began developing intermittent pain between her shoulder blades. Her family states that when these would occur she would take her nitroglycerin and sit in a chair and the pain would resolve. This morning at around 5:30 she was awoken short of breath. Just before 6:00 she called her daughter and related that she was short of breath and anxious. Her daughter recommended she call 911, which she did. Soon thereafter (just after 6 AM) she was unable to speak and was only able to stutter. Along with this stuttering was some shaking. EMS transferred her to the emergency department and she was admitted to the internal medicine teaching service for further evaluation and care. Apparently, she initially was felt to be having a panic attack.  Of note, she has not been taking warfarin or a DOAC for her history of paroxysmal atrial fibrillation, but has been compliant with her Plavix and aspirin after her stenting in April. Her daughter states she has never seen her mother have a panic attack let alone an episode like this. According to the grandson, who works for the fire department in [facility] [place] and has accompanied her in ambulance rides, notes that she will sometimes have panic attacks like this. CT and MRI not available at this time.       SLP Plan  Continue with current plan of care     Recommendations  Medication Administration: Whole meds with liquid                Oral Care Recommendations: Oral care BID Follow up Recommendations:  (TBD) Plan: Continue with current plan of care       GO           Functional Assessment Tool Used: Skilled clinical observations Functional Limitations: Spoken language expressive Spoken Language Expression Current Status [id number]): 100 percent impaired, limited or restricted Spoken Language Expression Goal Status (J0093): At least 20 percent but less than 40 percent impaired, limited or restricted    Heather [name] 01/03/2016, 5:32 PM

## 2016-01-03 NOTE — ED Provider Notes (Signed)
Emergency Department Provider Note   I have reviewed the triage vital signs and the nursing notes.   HISTORY  Chief Complaint Shortness of Breath and Anxiety   HPI Heather [name] is a 82 y.o. female with PMH of CAD, CHF, GERD, and HTN presents to the emergency department for evaluation of difficulty breathing, significant anxiety, and continued shoulder pain. The patient has a significant coronary artery disease history with multiple areas of stenosis requiring stenting and July 2017. Family states that the patient has been complaining of shoulder blade pain for the past month. The pain is intermittent. Not significantly worse with exertion. This morning the patient had some difficulty breathing and took a baby aspirin and nitroglycerin and called to inform her family that she was calling 911. It was stated that they think that patient began getting very upset at the family is going to have to miss work and that she would not be able see her grandchildren this morning. They state that since her phone conversation the patient has been nearly inconsolable and difficult to communicate with.  By shaking her head yes and no patient denies chest pain or difficulty breathing at this time. Because of her rapid breathing and anxiety symptoms she is unable to provide a more detailed history or review of systems at this time. Practiced deep breathing and will try again later.   Past Medical History:  Diagnosis Date  . Arthritis   . CAD (coronary artery disease), native coronary artery 07/14/2013   Catheterization August/2003 showing severe three-vessel disease and moderate mitral regurgitation 08/28/2001 CABG with internal mammary graft to LAD, vein graft to circumflex, and vein graft to posterolateral branch and mitral valve repair by Dr. 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 Patent internal mammary graft  with competitive flow in the distal vessel, occluded right coronary artery, occluded circumflex, patent saphenous vein graft to circumflex with mild/moderate disease proximally, minimal LV damage   . Chronic diastolic CHF (congestive heart failure) (HCC)    a. EF 60% by LV gram 01/2009.  . Coronary artery disease    a. s/p MI in 1980;  b. 2003 CABG x 3 (LIMA->LAD, VG->OM, VG->RPL);  c. 01/2009 Cath: 3/3 patent grafts, native 3VD, EF 60%.  Marland Kitchen GERD (gastroesophageal reflux disease)   . H/O mitral valve repair    a. 2003  . Hyperlipidemia 07/14/2013   Intolerance to multiple statins including Crestor, pravastatin, and Lipitor   . Hypertension   . Hypothyroidism   . PAF (paroxysmal atrial fibrillation) ([place])   . S/P partial hysterectomy 1977    Patient Active Problem List   Diagnosis Date Noted  . Dyspnea 01/03/2016  . Anxiety attack 01/03/2016  . PAF (paroxysmal atrial fibrillation) ([place]) 01/03/2016  . Aphasia   . Other hyperlipidemia   . NSTEMI (non-ST elevated myocardial infarction) ([place]) 04/30/2015  . Hypertension, uncontrolled 07/19/2014  . H/O mitral valve repair   . CAD (coronary artery disease) of artery bypass graft, s/p PCI stent to VG->RCA 04/30/15 07/14/2013  . Obesity (BMI 30-39.9) 07/14/2013  . Hyperlipidemia 07/14/2013  . Hypothyroidism 07/14/2013  . Hypertensive heart disease 07/14/2013    Past Surgical History:  Procedure Laterality Date  . ABDOMINAL HYSTERECTOMY  1977  . APPENDECTOMY  1950  . BLADDER SUSPENSION     tac  . CARDIAC CATHETERIZATION  03,05,11  . CARDIAC CATHETERIZATION N/A 04/30/2015   Procedure: Left Heart Cath and  Cors/Grafts Angiography;  Surgeon: Belva Crome, MD;  Location: [facility];  Service: Cardiovascular;  Laterality: N/A;  . CARDIAC CATHETERIZATION N/A 04/30/2015   Procedure: Coronary Stent Intervention;  Surgeon: Belva Crome, MD;  Location: [facility];  Service: Cardiovascular;  Laterality: N/A;  Proximal and Distal SVG to  the PLA  . CARPAL TUNNEL RELEASE     right and left  . CHOLECYSTECTOMY    . CORONARY ARTERY BYPASS GRAFT  2003  . KNEE ARTHROSCOPY     left  . LEFT HEART CATHETERIZATION WITH CORONARY ANGIOGRAM N/A 07/12/2013   Procedure: LEFT HEART CATHETERIZATION WITH CORONARY ANGIOGRAM;  Surgeon: Jettie Booze, MD;  Location: [facility] C[facility];  Service: Cardiovascular;  Laterality: N/A;  . MITRAL VALVE REPAIR  2003   with cabg  . THYROIDECTOMY  1968  . THYROIDECTOMY, PARTIAL     left      Allergies Atorvastatin; Codeine; Crestor [rosuvastatin]; Pravastatin; Simvastatin; and Ticagrelor  Family History  Problem Relation Age of Onset  . Other      no premature CAD.    Social History Social History  Substance Use Topics  . Smoking status: Former Smoker    Quit date: 06/06/1967  . Smokeless tobacco: Former Systems developer  . Alcohol use No    Review of Systems  Constitutional: No fever/chills Eyes: No visual changes. ENT: No sore throat. Cardiovascular: Denies chest pain. Respiratory: Positive shortness of breath. Gastrointestinal: No abdominal pain.  No nausea, no vomiting.  No diarrhea.  No constipation. Genitourinary: Negative for dysuria. Musculoskeletal: Negative for back pain. Skin: Negative for rash. Neurological: Negative for headaches, focal weakness or numbness.  10-point ROS otherwise negative.  ____________________________________________   PHYSICAL EXAM:  VITAL SIGNS: ED Triage Vitals  Enc Vitals Group     BP 01/03/16 0710 (!) 150/128     Pulse Rate 01/03/16 0710 99     Resp 01/03/16 0710 24     Temp 01/03/16 0710 98.1 F (36.7 C)     Temp Source 01/03/16 0710 Oral     SpO2 01/03/16 0710 100 %     Pain Score 01/03/16 0724 0   Constitutional: Alert and breathing rapidly and shaking. Unable to provide much information verbally but responds to deep breathing exercises and nods "yes" and "no" appropriately.  Eyes: Conjunctivae are normal.  Head: Atraumatic. Nose: No  congestion/rhinnorhea. Mouth/Throat: Mucous membranes are moist.  Oropharynx non-erythematous. Neck: No stridor.   Cardiovascular: Normal rate, regular rhythm. Good peripheral circulation. Grossly normal heart sounds.   Respiratory: Rapid respiratory rate.  No retractions. Lungs CTAB. Gastrointestinal: Soft and nontender. No distention.  Musculoskeletal: No lower extremity tenderness nor edema. No gross deformities of extremities. Neurologic: Patient with stuttering speech and agitation. Tearful. Baseline right hip weakness. No other focal neurological deficits.   Skin:  Skin is warm, dry and intact. No rash noted.  ____________________________________________   LABS (all labs ordered are listed, but only abnormal results are displayed)  Labs Reviewed  BASIC METABOLIC PANEL - Abnormal; Notable for the following:       Result Value   Glucose, Bld 120 (*)    All other components within normal limits  TROPONIN I - Abnormal; Notable for the following:    Troponin I 0.14 (*)    All other components within normal limits  CBC - Abnormal; Notable for the following:    HCT 35.8 (*)    All other components within normal limits  TROPONIN I - Abnormal; Notable  for the following:    Troponin I 0.15 (*)    All other components within normal limits  TROPONIN I - Abnormal; Notable for the following:    Troponin I 0.10 (*)    All other components within normal limits  CBC  TSH  TROPONIN I  BASIC METABOLIC PANEL  I-STAT TROPOININ, ED   ____________________________________________  EKG   EKG Interpretation  Date/Time:  Monday January 03 2016 07:12:41 EST Ventricular Rate:  96 PR Interval:    QRS Duration: 149 QT Interval:  388 QTC Calculation: 491 R Axis:   -14 Text Interpretation:  Sinus rhythm Prolonged PR interval Right bundle branch block Inferior infarct, age undetermined No STEMI. Similar to April 2017 tracing Confirmed by LONG MD, JOSHUA [phone]) on 01/03/2016 7:16:24 AM         ____________________________________________  RADIOLOGY  Dg Chest 2 View  Result Date: 01/03/2016 CLINICAL DATA:  Shortness of Breath and chest pain EXAM: CHEST  2 VIEW COMPARISON:  04/29/2015 FINDINGS: Cardiac shadow is again enlarged in size. Postsurgical changes are seen. Tortuosity of the thoracic aorta is again noted. The lungs are hypoinflated although no focal confluent infiltrate or sizable effusion is seen. Mild atelectatic changes are noted left slightly greater than right. IMPRESSION: Mild bibasilar atelectatic changes. Overall poor inspiratory effort. Postoperative changes stable from previous exam. Electronically Signed   By: Inez Catalina M.D.   On: 01/03/2016 08:12    ____________________________________________   PROCEDURES  Procedure(s) performed:   Procedures  None ____________________________________________   INITIAL IMPRESSION / ASSESSMENT AND PLAN / ED COURSE  Pertinent labs & imaging results that were available during my care of the patient were reviewed by me and considered in my medical decision making (see chart for details).  Patient presents to the emergency department for evaluation of difficulty breathing and one month of shoulder blade discomfort intermittently. She is significantly anxious on arrival but is able to shake her head and denies that she is having active chest pain or feeling difficulty breathing. Patient has a significant coronary artery disease history with intervention in July. Plan for chest x-ray, labs, further monitoring. Will obtain additional history and review of systems once the patient is more calm.   Initial troponin is negative. Spoke with the IM resident team and placed temporary admission orders. Patient is more calm now but continued stuttering speech. Paged Neurology for consultation. Patient with no other focal neurological deficits at this time. With dyspnea and shoulder discomfort intermittently I have low suspicion  for CVA initially but with speech not resolving entirely makes this diagnosis a possibility. Patient has had similar symptoms with presumed anxiety attacks in the past.  ____________________________________________  FINAL CLINICAL IMPRESSION(S) / ED DIAGNOSES  Final diagnoses:  Dyspnea, unspecified type  Aphasia     MEDICATIONS GIVEN DURING THIS VISIT:  Medications  levothyroxine (SYNTHROID, LEVOTHROID) tablet 50 mcg (not administered)  clopidogrel (PLAVIX) tablet 75 mg (75 mg Oral Not Given 01/03/16 1230)  acetaminophen (TYLENOL) tablet 500 mg (not administered)  nitroGLYCERIN (NITROSTAT) SL tablet 0.4 mg (not administered)  enoxaparin (LOVENOX) injection 40 mg (0 mg Subcutaneous Hold 01/03/16 1400)  LORazepam (ATIVAN) injection 0.5 mg (not administered)  aspirin chewable tablet 243 mg (243 mg Oral Given 01/03/16 0734)  LORazepam (ATIVAN) injection 1 mg (1 mg Intravenous Given 01/03/16 1230)     NEW OUTPATIENT MEDICATIONS STARTED DURING THIS VISIT:  None   Note:  This document was prepared using Dragon voice recognition software and may include unintentional dictation  errors.  Nanda Quinton, MD Emergency Medicine   Margette Fast, MD 01/03/16 [phone]

## 2016-01-03 NOTE — Consult Note (Signed)
Requesting Physician: Dr. Eppie Gibson    Chief Complaint: Code Stroke  History obtained from:  family  HPI:                                                                                                                                         Heather [name] is an 82 y.o. female was initially brought in for shortness of breath and the feeling of anxiety. Patient called EMS to be brought to the hospital. One of the family members who works for the [employer] was initially on seen and stated that she was having stuttering of speech on site. This family member also states that she's had panic attacks in the past, and during those panic attack she did have stuttering of speech however her studying as speech did not last as long as it has today.  This afternoon a code stroke was called by the teaching service secondary to stuttering of speech. On consultation patient is in the room, stuttering but making nonsensical speech, she will follow verbal and visual commands, she has a distractible tremor, she is able to nod yes and no appropriately. Patient was outside of the TPA window and was not considered.    Date last known well: Date: 01/03/2016 Time last known well: Time: 06:30 tPA Given: No: out of window   Past Medical History:  Diagnosis Date  . Arthritis   . CAD (coronary artery disease), native coronary artery 07/14/2013   Catheterization August/2003 showing severe three-vessel disease and moderate mitral regurgitation 08/28/2001 CABG with internal mammary graft to LAD, vein graft to circumflex, and vein graft to posterolateral branch and mitral valve repair by Dr. 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 Patent internal mammary graft with competitive flow in the distal vessel, occluded right coronary artery, occluded circumflex, patent saphenous vein graft to circumflex with mild/moderate disease proximally, minimal LV  damage   . Chronic diastolic CHF (congestive heart failure) (HCC)    a. EF 60% by LV gram 01/2009.  . Coronary artery disease    a. s/p MI in 1980;  b. 2003 CABG x 3 (LIMA->LAD, VG->OM, VG->RPL);  c. 01/2009 Cath: 3/3 patent grafts, native 3VD, EF 60%.  Marland Kitchen GERD (gastroesophageal reflux disease)   . H/O mitral valve repair    a. 2003  . Hyperlipidemia 07/14/2013   Intolerance to multiple statins including Crestor, pravastatin, and Lipitor   . Hypertension   . Hypothyroidism   . PAF (paroxysmal atrial fibrillation) ([place])   . S/P partial hysterectomy 1977    Past Surgical History:  Procedure Laterality Date  . ABDOMINAL HYSTERECTOMY  1977  . APPENDECTOMY  1950  . BLADDER SUSPENSION     tac  . CARDIAC CATHETERIZATION  03,05,11  . CARDIAC CATHETERIZATION N/A 04/30/2015   Procedure: Left Heart Cath and Cors/Grafts Angiography;  Surgeon: Belva Crome, MD;  Location: [facility];  Service: Cardiovascular;  Laterality: N/A;  . CARDIAC CATHETERIZATION N/A 04/30/2015   Procedure: Coronary Stent Intervention;  Surgeon: Belva Crome, MD;  Location: [facility];  Service: Cardiovascular;  Laterality: N/A;  Proximal and Distal SVG to the PLA  . CARPAL TUNNEL RELEASE     right and left  . CHOLECYSTECTOMY    . CORONARY ARTERY BYPASS GRAFT  2003  . KNEE ARTHROSCOPY     left  . LEFT HEART CATHETERIZATION WITH CORONARY ANGIOGRAM N/A 07/12/2013   Procedure: LEFT HEART CATHETERIZATION WITH CORONARY ANGIOGRAM;  Surgeon: Jettie Booze, MD;  Location: [facility] CATH LAB;  Service: Cardiovascular;  Laterality: N/A;  . MITRAL VALVE REPAIR  2003   with cabg  . THYROIDECTOMY  1968  . THYROIDECTOMY, PARTIAL     left    Family History  Problem Relation Age of Onset  . Other      no premature CAD.   Social History:  reports that she quit smoking about 48 years ago. She has quit using smokeless tobacco. She reports that she does not drink alcohol or use drugs.  Allergies:  Allergies   Allergen Reactions  . Atorvastatin     Muscle ache  . Codeine Nausea And Vomiting  . Crestor [Rosuvastatin]     Muscle ache  . Pravastatin     Muscle ache  . Simvastatin     Muscle cramps   . Ticagrelor     Dyspnea     Medications:                                                                                                                           Prior to Admission:  Prescriptions Prior to Admission  Medication Sig Dispense Refill Last Dose  . acetaminophen (TYLENOL) 500 MG tablet Take 500 mg by mouth every 6 (six) hours as needed for moderate pain.   unk at [employer]  . aspirin EC 81 MG tablet Take 81 mg by mouth daily.   01/03/2016 at Unknown time  . calcium-vitamin D (OSCAL WITH D) 500-200 MG-UNIT per tablet Take 1 tablet by mouth daily.   01/02/2016 at Unknown time  . clopidogrel (PLAVIX) 75 MG tablet Take 1 tablet (75 mg total) by mouth daily. 30 tablet 12 01/02/2016 at 0800  . levothyroxine (SYNTHROID, LEVOTHROID) 50 MCG tablet Take 50 mcg by mouth daily.   01/02/2016 at Unknown time  . losartan (COZAAR) 50 MG tablet Take 1 tablet (50 mg total) by mouth daily. 30 tablet 1 01/02/2016 at Unknown time  . magnesium oxide (MAG-OX) 400 MG tablet Take 250 mg by mouth daily.    01/02/2016 at Unknown time  . nitroGLYCERIN (NITROSTAT) 0.4 MG SL tablet Place 0.4 mg under the tongue every 5 (five) minutes as needed for chest pain.   01/03/2016 at Unknown time  . ranitidine (ZANTAC) 150 MG tablet Take 150 mg by mouth  daily as needed for heartburn.    unk at unk   Scheduled: . clopidogrel  75 mg Oral Daily  . enoxaparin (LOVENOX) injection  40 mg Subcutaneous Q24H  . [START ON 01/04/2016] levothyroxine  50 mcg Oral QAC breakfast  . LORazepam  1 mg Intravenous Once  . losartan  50 mg Oral Daily    ROS:                                                                                                                                       History obtained from unobtainable from patient due  to language barrier--stuttering nonsensically    Neurologic Examination:                                                                                                      Blood pressure (!) 142/74, pulse 89, temperature 98.2 F (36.8 C), temperature source Oral, resp. rate 20, SpO2 96 %.  HEENT-  Normocephalic, no lesions, without obvious abnormality.  Normal external eye and conjunctiva.  Normal TM's bilaterally.  Normal auditory canals and external ears. Normal external nose, mucus membranes and septum.  Normal pharynx. Cardiovascular- S1, S2 normal, pulses palpable throughout   Lungs- chest clear, no wheezing, rales, normal symmetric air entry Abdomen- normal findings: bowel sounds normal Extremities- no edema Lymph-no adenopathy palpable Musculoskeletal-no joint tenderness, deformity or swelling Skin-warm and dry, no hyperpigmentation, vitiligo, or suspicious lesions  Neurological Examination Mental Status: Alert,  able to follow both visual and verbal commands. She is mumbling nonsensically however if not talked to she does calm down in the mumbling and stuttering will slow down and at times stopped. Cranial Nerves: II:  Visual fields grossly normal--able to count fingers visually and show the number of fingers on her own hands  III,IV, VI: ptosis not present, extra-ocular motions intact bilaterally, pupils equal, round, reactive to light and accommodation V,VII: smile symmetric, facial light touch sensation normal bilaterally VIII: hearing normal bilaterally IX,X: uvula rises symmetrically XI: bilateral shoulder shrug XII: midline tongue extension Motor:  Moving all extremities antigravity with good strength. She does show a right arm tremor however this tremor is distractible.  Sensory: Pinprick and light touch intact throughout, bilaterally Deep Tendon Reflexes: 2+ and symmetric throughout Plantars: Right: downgoing   Left: downgoing Cerebellar: normal  finger-to-nose, Gait: not tested      Lab Results: Basic Metabolic Panel:  Recent Labs Lab 01/03/16 0729  NA 138  K 3.9  CL 103  CO2  22  GLUCOSE 120*  BUN 16  CREATININE 0.82  CALCIUM 9.9    Liver Function Tests: No results for input(s): AST, ALT, ALKPHOS, BILITOT, PROT, ALBUMIN in the last 168 hours. No results for input(s): LIPASE, AMYLASE in the last 168 hours. No results for input(s): AMMONIA in the last 168 hours.  CBC:  Recent Labs Lab 01/03/16 0729 01/03/16 1231  WBC 5.7 5.9  HGB 12.7 12.1  HCT 36.3 35.8*  MCV 85.2 85.2  PLT 191 183    Cardiac Enzymes:  Recent Labs Lab 01/03/16 1207 01/03/16 1231  TROPONINI 0.14* 0.15*    Lipid Panel: No results for input(s): CHOL, TRIG, HDL, CHOLHDL, VLDL, LDLCALC in the last 168 hours.  CBG: No results for input(s): GLUCAP in the last 168 hours.  Microbiology: Results for orders placed or performed during the hospital encounter of 04/29/15  MRSA PCR Screening     Status: None   Collection Time: 04/30/15  5:29 AM  Result Value Ref Range Status   MRSA by PCR NEGATIVE NEGATIVE Final    Comment:        The GeneXpert MRSA Assay (FDA approved for NASAL specimens only), is one component of a comprehensive MRSA colonization surveillance program. It is not intended to diagnose MRSA infection nor to guide or monitor treatment for MRSA infections.     Coagulation Studies: No results for input(s): LABPROT, INR in the last 72 hours.  Imaging: Dg Chest 2 View  Result Date: 01/03/2016 CLINICAL DATA:  Shortness of Breath and chest pain EXAM: CHEST  2 VIEW COMPARISON:  04/29/2015 FINDINGS: Cardiac shadow is again enlarged in size. Postsurgical changes are seen. Tortuosity of the thoracic aorta is again noted. The lungs are hypoinflated although no focal confluent infiltrate or sizable effusion is seen. Mild atelectatic changes are noted left slightly greater than right. IMPRESSION: Mild bibasilar atelectatic  changes. Overall poor inspiratory effort. Postoperative changes stable from previous exam. Electronically Signed   By: Inez Catalina M.D.   On: 01/03/2016 08:12       Assessment and plan discussed with with attending physician and they are in agreement.    Etta Quill PA-C [facility] [phone]  01/03/2016, 1:54 PM   I have seen and evaluated the patient and reviewed the above note.   Assessment: 82 y.o. female  present hospital shortness of breath. On initial presentation with EMS she also presented with stuttering speech.  Exam is notable for a distractible tremor, ability to follow visual and verbal commands although stuttering nonsensically. Able to nod and make gestures appropriately and correctly. Showing no lateralizing weakness. She is out of the window for IV tPA, not an IR candidate as low NIH  My suspicion is that these are psychogenic symptoms, but this is a diagnosis of exclusion as she could eb embelishing on real findings.   Stroke Risk Factors - atrial fibrillation, hyperlipidemia and hypertension  1) MRI brain  2) EEG 3) further recs followign above.   Roland Rack, MD [facility] [phone]  If 7pm- 7am, please page neurology on call as listed in [place].

## 2016-01-03 NOTE — Code Documentation (Signed)
82yo female admitted today for chest pain and SOB this morning.  Per family patient has had stuttering speech since 0600 today which has been ongoing since that time.  Patient admitted to 2W26 and code stroke activated per request of the primary MD.  Stroke team to the bedside.  Patient was ambulating from the bathroom to the bed with a walker and bedside RN.  NIHSS 6, see documentation for details and code stroke times.  Patient with stuttering, garbled speech on exam.  Patient unable to hold right leg off bed for 5 seconds which is baseline per family d/t issues with right hip.  Dr. Leonel Ramsay to the bedside.  CT not needed at this time.  Patient is outside the window for treatment with tPA.  Code stroke canceled.  MD to order MRI and EEG.  Handoff with 2W RN Fara Chute.

## 2016-01-03 NOTE — ED Notes (Signed)
Report given to 2W RN.

## 2016-01-03 NOTE — Progress Notes (Signed)
SLP Cancellation Note  Patient Details Name: ALEILA SYVERSON MRN: [id number] DOB: 07/20/1930   Cancelled treatment:       Reason Eval/Treat Not Completed: Patient at procedure or test/unavailable   Unable to complete Bedside Swallow Evaluation d/t transport in room taking pt to CT/MRI  Happi B. Rutherford Nail, M.S., CCC-SLP Speech-Language Pathologist   [name] 01/03/2016, 4:19 PM

## 2016-01-03 NOTE — Significant Event (Signed)
Rapid Response Event Note  Overview:  Code stroke called  Time Called: 7824 Arrival Time: 1340 Event Type: Neurologic  Initial Focused Assessment:  Code stroke called.  On my arrival to patients room Rn and family at bedside.  Patient was in bathroom and ambulating back to bed with wheelchair.  As per family and RN apteint is a new admission, called EMS for CP and SOB this am and at 0600 when EMS arrived at her house she was stuttering which is still ongoing.  MD was at bedside and advised for Code stroke activation 149/75, 98, 96% on RA.     Interventions:  NIHSS 4.  Dr. Leonel Ramsay at bedside.  Code stroke cancelled.  MRI ordered  Plan of Care (if not transferred):  Patient to be monitored and RN to call if assistance needed  Event Summary: RN to call if assistance needed   at      at          [facility], Harlin Rain

## 2016-01-03 NOTE — Progress Notes (Signed)
Spoke with Dr. Charlynn Grimes. Updated as to rapid RN and neurology's assessment. Given verbal order to continue with CT of head. Spoke with CT and made them aware.   Fritz Pickerel, RN

## 2016-01-03 NOTE — Evaluation (Signed)
Clinical/Bedside Swallow Evaluation Patient Details  Name: Heather [name] MRN: [id number] Date of Birth: Jan 14, 1931  Today's Date: 01/03/2016 Time: SLP Start Time (ACUTE ONLY): 1623 SLP Stop Time (ACUTE ONLY): 1638 SLP Time Calculation (min) (ACUTE ONLY): 15 min  Past Medical History:  Past Medical History:  Diagnosis Date  . Arthritis   . CAD (coronary artery disease), native coronary artery 07/14/2013   Catheterization August/2003 showing severe three-vessel disease and moderate mitral regurgitation 08/28/2001 CABG with internal mammary graft to LAD, vein graft to circumflex, and vein graft to posterolateral branch and mitral valve repair by Dr. 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 Patent internal mammary graft with competitive flow in the distal vessel, occluded right coronary artery, occluded circumflex, patent saphenous vein graft to circumflex with mild/moderate disease proximally, minimal LV damage   . Chronic diastolic CHF (congestive heart failure) (HCC)    a. EF 60% by LV gram 01/2009.  . Coronary artery disease    a. s/p MI in 1980;  b. 2003 CABG x 3 (LIMA->LAD, VG->OM, VG->RPL);  c. 01/2009 Cath: 3/3 patent grafts, native 3VD, EF 60%.  Marland Kitchen GERD (gastroesophageal reflux disease)   . H/O mitral valve repair    a. 2003  . Hyperlipidemia 07/14/2013   Intolerance to multiple statins including Crestor, pravastatin, and Lipitor   . Hypertension   . Hypothyroidism   . PAF (paroxysmal atrial fibrillation) ([place])   . S/P partial hysterectomy 1977   Past Surgical History:  Past Surgical History:  Procedure Laterality Date  . ABDOMINAL HYSTERECTOMY  1977  . APPENDECTOMY  1950  . BLADDER SUSPENSION     tac  . CARDIAC CATHETERIZATION  03,05,11  . CARDIAC CATHETERIZATION N/A 04/30/2015   Procedure: Left Heart Cath and Cors/Grafts Angiography;  Surgeon: Belva Crome, MD;  Location: [place]  CV LAB;  Service: Cardiovascular;  Laterality: N/A;  . CARDIAC CATHETERIZATION N/A 04/30/2015   Procedure: Coronary Stent Intervention;  Surgeon: Belva Crome, MD;  Location: [place] [facility];  Service: Cardiovascular;  Laterality: N/A;  Proximal and Distal SVG to the PLA  . CARPAL TUNNEL RELEASE     right and left  . CHOLECYSTECTOMY    . CORONARY ARTERY BYPASS GRAFT  2003  . KNEE ARTHROSCOPY     left  . LEFT HEART CATHETERIZATION WITH CORONARY ANGIOGRAM N/A 07/12/2013   Procedure: LEFT HEART CATHETERIZATION WITH CORONARY ANGIOGRAM;  Surgeon: Jettie Booze, MD;  Location: [facility] CATH LAB;  Service: Cardiovascular;  Laterality: N/A;  . MITRAL VALVE REPAIR  2003   with cabg  . THYROIDECTOMY  1968  . THYROIDECTOMY, PARTIAL     left   HPI:  Heather [name] is an 82 year old woman with a history of coronary artery disease status post coronary artery bypass graft 3 in 2003 and non-ST elevation MI in April 2017 requiring multiple stents, paroxysmal atrial fibrillation, hypertension, hyperlipidemia, gastroesophageal reflux disease, and the need for mitral valve repair who was in her usual state of health until 3 weeks prior when she began developing intermittent pain between her shoulder blades. Her family states that when these would occur she would take her nitroglycerin and sit in a chair and the pain would resolve. This morning at around 5:30 she was awoken short of breath. Just before 6:00 she called her daughter and related that she was short of breath and anxious. Her daughter recommended she call 911, which she  did. Soon thereafter (just after 6 AM) she was unable to speak and was only able to stutter. Along with this stuttering was some shaking. EMS transferred her to the emergency department and she was admitted to the internal medicine teaching service for further evaluation and care. Apparently, she initially was felt to be having a panic attack. Of note, she has not been taking warfarin or a  DOAC for her history of paroxysmal atrial fibrillation, but has been compliant with her Plavix and aspirin after her stenting in April. Her daughter states she has never seen her mother have a panic attack let alone an episode like this. According to the grandson, who works for the fire department in [employer] and has accompanied her in ambulance rides, notes that she will sometimes have panic attacks like this. CT and MRI not available at this time.    Assessment / Plan / Recommendation Clinical Impression  Pt returned from CT via transport quickly as study had been cancelled. I proceeded with BSE so that pt would not be NPO until next day. Pt appears at reduced risk for aspiration when following general aspiration with a regular diet and thin liquids. Pt consumed trials of regular solids with thin liquids via straw without overt s/s of aspriation. Of note, pt with history of GERD with esophaogram on 08/26/2015 which found a small hiatal hernia with mild gastroeophageal reflux. In addition, there was deviation of lower cervical esophagus to the left of midline as a result of enlargment of the right lobe of thyroid (i.e., thyroid goiter). Daughter present during exam and pt hasn't expereienced any oropharyngeal difficulty with regular diet consistencies despite these structural differences. ST recommends return to regular diet with thin liquids, pills whole with thin liquids. Provided education for pt to follow reflux precautions. No further dysphagia therapy appears indicated at this time.     Aspiration Risk  Mild aspiration risk    Diet Recommendation Regular;Thin liquid   Liquid Administration via: Straw;Cup Medication Administration: Whole meds with liquid Supervision: Patient able to self feed    Other  Recommendations Oral Care Recommendations: Oral care BID   Follow up Recommendations None      Frequency and Duration min 2x/week          Prognosis Prognosis for Safe Diet  Advancement: Good Barriers to Reach Goals:  (dysfluent speech)      Swallow Study   General Date of Onset: 01/03/16 HPI: Heather [name] is an 82 year old woman with a history of coronary artery disease status post coronary artery bypass graft 3 in 2003 and non-ST elevation MI in April 2017 requiring multiple stents, paroxysmal atrial fibrillation, hypertension, hyperlipidemia, gastroesophageal reflux disease, and the need for mitral valve repair who was in her usual state of health until 3 weeks prior when she began developing intermittent pain between her shoulder blades. Her family states that when these would occur she would take her nitroglycerin and sit in a chair and the pain would resolve. This morning at around 5:30 she was awoken short of breath. Just before 6:00 she called her daughter and related that she was short of breath and anxious. Her daughter recommended she call 911, which she did. Soon thereafter (just after 6 AM) she was unable to speak and was only able to stutter. Along with this stuttering was some shaking. EMS transferred her to the emergency department and she was admitted to the internal medicine teaching service for further evaluation and care. Apparently, she initially was felt  to be having a panic attack. Of note, she has not been taking warfarin or a DOAC for her history of paroxysmal atrial fibrillation, but has been compliant with her Plavix and aspirin after her stenting in April. Her daughter states she has never seen her mother have a panic attack let alone an episode like this. According to the grandson, who works for the fire department in [employer] and has accompanied her in ambulance rides, notes that she will sometimes have panic attacks like this. CT and MRI not available at this time.  Type of Study: Bedside Swallow Evaluation Previous Swallow Assessment:  (None in chart) Diet Prior to this Study: NPO Temperature Spikes Noted: No Respiratory Status: Room  air History of Recent Intubation: No Behavior/Cognition: Alert;Cooperative;Pleasant mood Oral Cavity Assessment: Within Functional Limits Oral Care Completed by SLP: No Oral Cavity - Dentition: Adequate natural dentition Vision: Functional for self-feeding Self-Feeding Abilities: Able to feed self Patient Positioning: Upright in bed Baseline Vocal Quality: Normal Volitional Cough: Strong Volitional Swallow: Able to elicit    Oral/Motor/Sensory Function Overall Oral Motor/Sensory Function: Within functional limits   Ice Chips Ice chips: Within functional limits Presentation: Spoon   Thin Liquid Thin Liquid: Within functional limits Presentation: Straw;Spoon;Self Fed;Cup    Nectar Thick Nectar Thick Liquid: Not tested   Honey Thick Honey Thick Liquid: Not tested   Puree Puree: Within functional limits Presentation: Self Fed;Spoon   Solid   GO   Solid: Within functional limits Presentation: Self Fed;Spoon    Functional Assessment Tool Used: Skilled clinical observations Functional Limitations: Spoken language expressive Spoken Language Expression Current Status [phone]): 100 percent impaired, limited or restricted Spoken Language Expression Goal Status (Y8016): At least 20 percent but less than 40 percent impaired, limited or restricted   [name] 01/03/2016,5:27 PM

## 2016-01-04 ENCOUNTER — Encounter (HOSPITAL_COMMUNITY): Payer: Self-pay | Admitting: *Deleted

## 2016-01-04 ENCOUNTER — Observation Stay (HOSPITAL_COMMUNITY): Payer: Medicare Other

## 2016-01-04 DIAGNOSIS — I2510 Atherosclerotic heart disease of native coronary artery without angina pectoris: Secondary | ICD-10-CM

## 2016-01-04 DIAGNOSIS — I480 Paroxysmal atrial fibrillation: Secondary | ICD-10-CM | POA: Diagnosis not present

## 2016-01-04 DIAGNOSIS — R748 Abnormal levels of other serum enzymes: Secondary | ICD-10-CM

## 2016-01-04 DIAGNOSIS — R778 Other specified abnormalities of plasma proteins: Secondary | ICD-10-CM

## 2016-01-04 DIAGNOSIS — I2582 Chronic total occlusion of coronary artery: Secondary | ICD-10-CM | POA: Diagnosis not present

## 2016-01-04 DIAGNOSIS — T82855A Stenosis of coronary artery stent, initial encounter: Secondary | ICD-10-CM | POA: Diagnosis not present

## 2016-01-04 DIAGNOSIS — F410 Panic disorder [episodic paroxysmal anxiety] without agoraphobia: Secondary | ICD-10-CM

## 2016-01-04 DIAGNOSIS — M546 Pain in thoracic spine: Secondary | ICD-10-CM

## 2016-01-04 DIAGNOSIS — R4701 Aphasia: Secondary | ICD-10-CM

## 2016-01-04 DIAGNOSIS — I25810 Atherosclerosis of coronary artery bypass graft(s) without angina pectoris: Secondary | ICD-10-CM | POA: Diagnosis not present

## 2016-01-04 DIAGNOSIS — I252 Old myocardial infarction: Secondary | ICD-10-CM | POA: Diagnosis not present

## 2016-01-04 DIAGNOSIS — R7989 Other specified abnormal findings of blood chemistry: Secondary | ICD-10-CM

## 2016-01-04 LAB — BASIC METABOLIC PANEL
Anion gap: 9 (ref 5–15)
BUN: 15 mg/dL (ref 6–20)
CO2: 26 mmol/L (ref 22–32)
Calcium: 9.7 mg/dL (ref 8.9–10.3)
Chloride: 103 mmol/L (ref 101–111)
Creatinine, Ser: 0.76 mg/dL (ref 0.44–1.00)
GFR calc Af Amer: >60 mL/min (ref >60)
GFR calc non Af Amer: >60 mL/min (ref >60)
Glucose, Bld: 114 mg/dL — ABNORMAL HIGH (ref 65–99)
Potassium: 3.9 mmol/L (ref 3.5–5.1)
Sodium: 138 mmol/L (ref 135–145)

## 2016-01-04 LAB — TROPONIN I: Troponin I: 0.06 ng/mL (ref <0.03)

## 2016-01-04 NOTE — Care Management Obs Status (Signed)
[place] NOTIFICATION   Patient Details  Name: MAYMUNA DETZEL MRN: [id number] Date of Birth: 1930-09-07   Medicare Observation Status Notification Given:  Yes    Dawayne Patricia, RN 01/04/2016, 3:08 PM

## 2016-01-04 NOTE — Progress Notes (Signed)
EEG Completed; Results Pending  

## 2016-01-04 NOTE — Procedures (Signed)
ELECTROENCEPHALOGRAM REPORT  Date of Study: 01/04/2016  Patient's Name: Heather [name] MRN: [id number] Date of Birth: 06/24/1930  Referring Provider: Roland Rack, MD  Clinical History: 82 year old woman presents with tremor and stuttering speech  Medications: acetaminophen (TYLENOL) tablet 500 mg    clopidogrel (PLAVIX) tablet 75 mg   enoxaparin (LOVENOX) injection 40 mg   levothyroxine (SYNTHROID, LEVOTHROID) tablet 50 mcg   LORazepam (ATIVAN) injection 0.5 mg   nitroGLYCERIN (NITROSTAT) SL tablet 0.4 mg  Technical Summary: A multichannel digital EEG recording measured by the international 10-20 system with electrodes applied with paste and impedances below 5000 ohms performed in our laboratory with EKG monitoring in an awake and asleep patient.  Hyperventilation and photic stimulation were not performed.  The digital EEG was referentially recorded, reformatted, and digitally filtered in a variety of bipolar and referential montages for optimal display.    Description: The patient is awake and asleep during the recording.  During maximal wakefulness, there is a symmetric, medium voltage 9 Hz posterior dominant rhythm that attenuates with eye opening.  The record is symmetric.  During drowsiness and sleep, there is an increase in theta slowing of the background.  Vertex waves and symmetric sleep spindles were seen.  There were no epileptiform discharges or electrographic seizures seen.    EKG lead was unremarkable.  Impression: This awake and asleep EEG is normal.    Clinical Correlation: A normal EEG does not exclude a clinical diagnosis of epilepsy.  If further clinical questions remain, prolonged EEG may be helpful.  Clinical correlation is advised.  Metta Clines, DO

## 2016-01-04 NOTE — Progress Notes (Signed)
Subjective:  Saw patient this afternoon - was off the floor for tests in the morning. She is doing well today. She is able to speak in full sentences and reports relief at being able to speak again. She tells me that yesterday morning she felt her heart start to race and felt like it was going to beat out of her chest. She then developed shortness of breath. She took nitro and ASA with no relief of her symptoms. She felt her blood pressure go up and had a headache. Denies any sense of impending doom. She called EMS at this point. After this she became aphasic and had arm and mouth tremors. She says that she was very excited and looking forward to seeing her grandchildren yesterday. Does not believe that she had any anxiety and denies any depression. Does report several weeks of back pain in between her shoulder blades. This has been intermittent. Denies any chest pain at any point during these events. No shortness of breath, nausea, lightheadedness/dizziness today. No further palpations since admission. Still has tremulous speech and mild stuttering at times during discussion today.   Objective:  Vital signs in last 24 hours: Vitals:   01/03/16 2000 01/04/16 0619 01/04/16 0856 01/04/16 1500  BP: (!) 123/50 112/72  (!) 116/52  Pulse: 62 75 82 70  Resp: 18 18  18   Temp: 98.9 F (37.2 C) 97.7 F (36.5 C)  98.6 F (37 C)  TempSrc: Oral Oral  Oral  SpO2: 94% 99%  97%   Physical Exam  Constitutional: She is oriented to person, place, and time and well-developed, well-nourished, and in no distress. No distress.  Cardiovascular: Normal rate and regular rhythm.  Exam reveals no gallop and no friction rub.   No murmur heard. Pulmonary/Chest: Effort normal. No respiratory distress. She has no wheezes. She has no rales.  Abdominal: Soft. Bowel sounds are normal. She exhibits no distension.  Neurological: She is alert and oriented to person, place, and time. No cranial nerve deficit.  No focal  deficits  Skin: Skin is warm and dry.  Psychiatric: Her mood appears anxious.   Assessment/Plan:  Panic Attacks Patient with acute onset heart palpations and shortness of breath. Shortly thereafter developed expressive aphasia. Concerning for Broca's aphasia and Code Stroke was initiated. She was evaluated by neurology and felt this was most likely psychogenic in nature and Code Stoke was cancelled. Patient underwent MRI which was unremarkable as well as EEG which was unrevealing. Overnight, her symptoms resolved and her speech returned. She remains anxious appearing with intermittent stuttering and tremulous speech however speech is otherwise normal. She denies any anxiety or life stressors today. Discussed options for treating panic attacks and she denied at this time. Will continue to monitor and defer to her PCP for further discussion after discharge.  -Ativan 0.5 mg prn   Elevated Troponin with Back Pain Troponin mildly elevated at 0.14>0.15>0.10>0.06 overnight. EKG with no acute ST changes. Denies any chest pain. Only reports this intermittent mid back pain over the past several weeks. No current shortness of breath, nausea/vomiting, lightheadedness/dizziness. Denies any sense of impending doom during episode yesterday described above. No clear explanation for her elevated troponin currently.  Consulted cardiology who recommends Lexiscan tomorrow for risk stratification. Will be NPO at midnight. -Appreciate cardiology assistance and recommendations -NPO at [place]  CAD s/p CABG in the remote past and multiple stent on April 2017 Need to rule out ACS as above.  - cont asa, palvix. - Not clear  why patient is not currently on a BB as heart rates have been 70-80s. Will discuss with cardiology in am.  - Allergic to statin. [facility]-9 was recommended in the past. Need to discuss this further outpatient.   HTN - BP well controlled. cont cozaar.  Hypothyroidism - TSH wnl -Continue  synthroid.  Dispo: Anticipated discharge in approximately 1 day(s).   Maryellen Pile, MD 01/04/2016, 6:02 PM Pager: [phone]

## 2016-01-04 NOTE — Progress Notes (Signed)
Subjective: patient returned from EEG. States she fell asleep during EEG and now feels better. Able to talk in full sentences. Family notes she was taking care of grandchildren and has a lot going on at home.   Exam: Vitals:   01/04/16 0619 01/04/16 0856  BP: 112/72   Pulse: 75 82  Resp: 18   Temp: 97.7 F (36.5 C)         Gen: In bed, NAD MS: Alert, oriented, follows command, mild stuttering mixed with fluent speech CN: 2-12 intact Motor: MAEW without tremor Sensory: intact   Pertinent Labs/Diagnostics: MRI shows no acute stroke EEG done and pending reading  Etta Quill PA-C [facility] [phone]  Impression: 82 y.o. female  present hospital shortness of breath. On initial presentation with EMS she also presented with stuttering speech.  Exam is notable for a distractible tremor, ability to follow visual and verbal commands although stuttering nonsensically. Able to nod and make gestures appropriately and correctly. Showing no lateralizing weakness.   MRI brain negative with improved speech today and no tremor. IF EEG negative suspect psychogenic/anxiety.    Recommendations: 1)If EEG is normal no further recommendations and will S/O    01/04/2016, 11:42 AM

## 2016-01-04 NOTE — Progress Notes (Signed)
Internal Medicine Attending  Date: 01/04/2016  Patient name: Heather [name] Medical record number: [id number] Date of birth: 1930-10-19 Age: 82 y.o. Gender: female  I saw and evaluated the patient. I reviewed the resident's note by Dr. Charlynn Grimes and I agree with the resident's findings and plans as documented in his progress note.  Upon my initial evaluation I was concerned [name] had an acute CVA. Neurology was kind enough to evaluate her and after an MRI and EEG it has been shown she did not have a CVA and her stuttering speech was likely psychogenic in nature. When Dr. Charlynn Grimes and I spoke with her this afternoon she was somewhat elusive when being questioned about anxiety and stress at home. I suspect there is more behind what is going on then we are currently aware of. Unfortunately, she is not interested in any pharmacologic intervention for panic attacks or anxiety at this point. This is not unreasonable and can be followed up by her primary care provider. She had a nonspecific troponin elevation and cardiology was consulted. We appreciate their evaluation and recommendation for risk stratification via a Lexiscan in the morning. Disposition is pending this evaluation, but I anticipate she may be ready for discharge tomorrow.

## 2016-01-04 NOTE — Consult Note (Signed)
Cardiology Consult    Patient ID: UNKNOWN Heather [name]: [id number], DOB/AGE: November 20, 1930   Admit date: 01/03/2016 Date of Consult: 01/04/2016  Primary Physician: Leonard Downing, MD Primary Cardiologist: Dr. Wynonia Lawman Requesting Provider: Dr. Eppie Gibson Reason for Consultation: Elevated Trop  Patient Profile    82 yo female with PMH of CAD s/p CABG with MV repair (2003) s/p inferior STEMI (07/2013) s/p PCI to SVG to PL, pAF, HFpEF who presented to the ED with dyspnea and shoulder pain. Found to have an elevated troponin.   Past Medical History   Past Medical History:  Diagnosis Date  . Arthritis   . CAD (coronary artery disease), native coronary artery 07/14/2013   Catheterization August/2003 showing severe three-vessel disease and moderate mitral regurgitation 08/28/2001 CABG with internal mammary graft to LAD, vein graft to circumflex, and vein graft to posterolateral branch and mitral valve repair by Dr. 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 Patent internal mammary graft with competitive flow in the distal vessel, occluded right coronary artery, occluded circumflex, patent saphenous vein graft to circumflex with mild/moderate disease proximally, minimal LV damage   . Chronic diastolic CHF (congestive heart failure) (HCC)    a. EF 60% by LV gram 01/2009.  . Coronary artery disease    a. s/p MI in 1980;  b. 2003 CABG x 3 (LIMA->LAD, VG->OM, VG->RPL);  c. 01/2009 Cath: 3/3 patent grafts, native 3VD, EF 60%.  Marland Kitchen GERD (gastroesophageal reflux disease)   . H/O mitral valve repair    a. 2003  . Hyperlipidemia 07/14/2013   Intolerance to multiple statins including Crestor, pravastatin, and Lipitor   . Hypertension   . Hypothyroidism   . NSTEMI (non-ST elevated myocardial infarction) ([place])   . PAF (paroxysmal atrial fibrillation) ([place])   . S/P partial hysterectomy 1977    Past Surgical History:    Procedure Laterality Date  . ABDOMINAL HYSTERECTOMY  1977  . APPENDECTOMY  1950  . BLADDER SUSPENSION     tac  . CARDIAC CATHETERIZATION  03,05,11  . CARDIAC CATHETERIZATION N/A 04/30/2015   Procedure: Left Heart Cath and Cors/Grafts Angiography;  Surgeon: Belva Crome, MD;  Location: [place] [facility];  Service: Cardiovascular;  Laterality: N/A;  . CARDIAC CATHETERIZATION N/A 04/30/2015   Procedure: Coronary Stent Intervention;  Surgeon: Belva Crome, MD;  Location: [place] [facility];  Service: Cardiovascular;  Laterality: N/A;  Proximal and Distal SVG to the PLA  . CARPAL TUNNEL RELEASE     right and left  . CHOLECYSTECTOMY    . CORONARY ARTERY BYPASS GRAFT  2003  . KNEE ARTHROSCOPY     left  . LEFT HEART CATHETERIZATION WITH CORONARY ANGIOGRAM N/A 07/12/2013   Procedure: LEFT HEART CATHETERIZATION WITH CORONARY ANGIOGRAM;  Surgeon: Jettie Booze, MD;  Location: [facility] C[facility];  Service: Cardiovascular;  Laterality: N/A;  . MITRAL VALVE REPAIR  2003   with cabg  . THYROIDECTOMY  1968  . THYROIDECTOMY, PARTIAL     left     Allergies  Allergies  Allergen Reactions  . Atorvastatin     Muscle ache  . Codeine Nausea And Vomiting  . Crestor [Rosuvastatin]     Muscle ache  . Pravastatin     Muscle ache  . Simvastatin     Muscle cramps   . Ticagrelor     Dyspnea     History of Present Illness    Mrs.  Heather [name] is a 82 yo female with PMH of CAD s/p CABG with MV repair (2003) s/p inferior STEMI (07/2013) s/p PCI to SVG to PL, pAF, HFpEF who is primarily followed by Dr. Wynonia Lawman. She presented back in 04/30/15 with chest pain and borderline troponin. She underwent LHC showing total occlusion of the SVG to Lcx with high grade obstruction in the SVG to RCA with 50% in stent restenosis and 99% stenosis beyond previously placed stent. Widely patent LIMA to LAD. Had successful mult-site stenting of the SVG to the RCA with 0% stenosis. She was discharged on ASA and Plavix.   Reports  she has been in her usual state of health until yesterday. She is limited in her physical activity and uses a walker around her home. Has been having intermittent pain in between her shoulder blades for about a month that is mostly present at night and gone when she gets up in the morning. Yesterday she had a sudden onset of feeling like " her heart was pounding in her chest". She took ASA and a nitro. Blood pressure was in the 161W systolic.  In the ED her labs showed trop 0.14>>0.06.  Stable electrolytes. TSH 0.740. EKG showed SR with 1st degree AVB, old RBBB.  Yesterday afternoon a CODE STROKE was called after she became aphasic and then had stuttering speech. No weakness noted. Neurology was consulted and following.   Inpatient Medications    . clopidogrel  75 mg Oral Daily  . enoxaparin (LOVENOX) injection  40 mg Subcutaneous Q24H  . levothyroxine  50 mcg Oral QAC breakfast    Family History    Family History  Problem Relation Age of Onset  . Other      no premature CAD.    Social History    Social History   Social History  . Marital status: Widowed    Spouse name: N/A  . Number of children: N/A  . Years of education: N/A   Occupational History  . Not on file.   Social History Main Topics  . Smoking status: Former Smoker    Quit date: 06/06/1967  . Smokeless tobacco: Former Systems developer  . Alcohol use No  . Drug use: No  . Sexual activity: Not Currently   Other Topics Concern  . Not on file   Social History Narrative   Lives in [place].  Widowed.     Review of Systems    General:  No chills, fever, night sweats or weight changes.  Cardiovascular:  See HPI Dermatological: No rash, lesions/masses Respiratory: No cough, dyspnea Urologic: No hematuria, dysuria Abdominal:   No nausea, vomiting, diarrhea, bright red blood per rectum, melena, or hematemesis Neurologic:  No visual changes, wkns, changes in mental status. All other systems reviewed and are otherwise negative  except as noted above.  Physical Exam    Blood pressure (!) 116/52, pulse 70, temperature 98.6 F (37 C), temperature source Oral, resp. rate 18, SpO2 97 %.  General: Pleasant older WF, NAD Psych: Normal affect. Neuro: Alert and oriented X 3. Moves all extremities spontaneously. HEENT: Normal  Neck: Supple without bruits or JVD. Lungs:  Resp regular and unlabored, CTA. Heart: RRR no s3, s4, murmurs. Abdomen: Soft, non-tender, non-distended, BS + x 4.  Extremities: No clubbing, cyanosis or edema. DP/PT/Radials 2+ and equal bilaterally.  Labs    Troponin [facility] Test)  Recent Labs  01/03/16 [place] 0.05    Recent Labs  01/03/16 1207 01/03/16 1231 01/03/16 1820  01/04/16 0027  TROPONINI 0.14* 0.15* 0.10* 0.06*   Lab Results  Component Value Date   WBC 5.9 01/03/2016   HGB 12.1 01/03/2016   HCT 35.8 (L) 01/03/2016   MCV 85.2 01/03/2016   PLT 183 01/03/2016    Recent Labs Lab 01/04/16 0227  NA 138  K 3.9  CL 103  CO2 26  BUN 15  CREATININE 0.76  CALCIUM 9.7  GLUCOSE 114*   Lab Results  Component Value Date   CHOL 180 04/30/2015   HDL 27 (L) 04/30/2015   LDLCALC 103 (H) 04/30/2015   TRIG 249 (H) 04/30/2015   Lab Results  Component Value Date   DDIMER 3.10 (H) 07/16/2013     Radiology Studies    Dg Chest 2 View  Result Date: 01/03/2016 CLINICAL DATA:  Shortness of Breath and chest pain EXAM: CHEST  2 VIEW COMPARISON:  04/29/2015 FINDINGS: Cardiac shadow is again enlarged in size. Postsurgical changes are seen. Tortuosity of the thoracic aorta is again noted. The lungs are hypoinflated although no focal confluent infiltrate or sizable effusion is seen. Mild atelectatic changes are noted left slightly greater than right. IMPRESSION: Mild bibasilar atelectatic changes. Overall poor inspiratory effort. Postoperative changes stable from previous exam. Electronically Signed   By: Inez Catalina M.D.   On: 01/03/2016 08:12   Mr Heather [name] Head [name]  Contrast  Result Date: 01/04/2016 CLINICAL DATA:  Initial evaluation for acute stuttering speech, aphasia EXAM: MRI HEAD WITHOUT CONTRAST MRA HEAD WITHOUT CONTRAST TECHNIQUE: Multiplanar, multiecho pulse sequences of the Heather and surrounding structures were obtained without intravenous contrast. Angiographic images of the head were obtained using MRA technique without contrast. COMPARISON:  Prior MRI from 07/19/2014. FINDINGS: MRI HEAD FINDINGS Heather: Cerebral volume within normal limits for age. Minimal patchy T2/FLAIR hyperintensity within the periventricular and deep white matter both cerebral hemispheres, most like related to chronic small vessel ischemic disease, felt to be within normal limits for age. No abnormal foci of restricted diffusion to suggest acute or subacute ischemia. Gray-white matter differentiation is well maintained. No evidence for acute or chronic intracranial hemorrhage. No evidence for chronic infarction. No mass lesion, midline shift, or mass effect. No hydrocephalus. No extra-axial fluid collection. Major dural sinuses are patent. Incidental note made of a partially empty sella. Midline structures intact. Vascular: Major intracranial vascular flow voids are maintained. Skull and upper cervical spine: Craniocervical junction within normal limits. Bone marrow signal intensity within normal limits. No scalp soft tissue abnormality. Sinuses/Orbits: Globes and orbital soft tissues within normal limits. Patient is status post lens extraction bilaterally. Scattered mucosal thickening throughout the paranasal sinuses. Fluid level within the right maxillary sinus suggest acute sinusitis. Small bilateral mastoid effusions, left greater than right. Inner ear structures normal. MRA HEAD FINDINGS ANTERIOR CIRCULATION: Visualized distal cervical segments of the internal carotid arteries are widely patent with antegrade flow. Petrous, cavernous, and supraclinoid segments widely patent without flow  limiting stenosis. A1 segments patent. Anterior communicating artery normal. Anterior cerebral arteries widely patent. M1 segments patent without stenosis or occlusion. MCA bifurcations normal. Distal MCA branches well opacified and symmetric. POSTERIOR CIRCULATION: Vertebral arteries patent to the vertebrobasilar junction. Left vertebral artery is dominant. Posterior inferior cerebellar arteries are patent proximally. Basilar artery widely patent. Superior cerebral arteries patent bilaterally. Both of the posterior cerebral arteries are well opacified to their distal aspects. Small bilateral posterior communicating arteries noted. No aneurysm or vascular malformation. IMPRESSION: MRI HEAD IMPRESSION: 1. Normal Heather MRI for patient age. 2. Fluid level within  the right maxillary sinus, suggestive of acute sinusitis. MRA HEAD IMPRESSION: Normal intracranial MRA. Electronically Signed   By: Jeannine Boga M.D.   On: 01/04/2016 05:18   Mr Heather [name] Contrast  Result Date: 01/04/2016 CLINICAL DATA:  Initial evaluation for acute stuttering speech, aphasia EXAM: MRI HEAD WITHOUT CONTRAST MRA HEAD WITHOUT CONTRAST TECHNIQUE: Multiplanar, multiecho pulse sequences of the Heather and surrounding structures were obtained without intravenous contrast. Angiographic images of the head were obtained using MRA technique without contrast. COMPARISON:  Prior MRI from 07/19/2014. FINDINGS: MRI HEAD FINDINGS Heather: Cerebral volume within normal limits for age. Minimal patchy T2/FLAIR hyperintensity within the periventricular and deep white matter both cerebral hemispheres, most like related to chronic small vessel ischemic disease, felt to be within normal limits for age. No abnormal foci of restricted diffusion to suggest acute or subacute ischemia. Gray-white matter differentiation is well maintained. No evidence for acute or chronic intracranial hemorrhage. No evidence for chronic infarction. No mass lesion, midline shift,  or mass effect. No hydrocephalus. No extra-axial fluid collection. Major dural sinuses are patent. Incidental note made of a partially empty sella. Midline structures intact. Vascular: Major intracranial vascular flow voids are maintained. Skull and upper cervical spine: Craniocervical junction within normal limits. Bone marrow signal intensity within normal limits. No scalp soft tissue abnormality. Sinuses/Orbits: Globes and orbital soft tissues within normal limits. Patient is status post lens extraction bilaterally. Scattered mucosal thickening throughout the paranasal sinuses. Fluid level within the right maxillary sinus suggest acute sinusitis. Small bilateral mastoid effusions, left greater than right. Inner ear structures normal. MRA HEAD FINDINGS ANTERIOR CIRCULATION: Visualized distal cervical segments of the internal carotid arteries are widely patent with antegrade flow. Petrous, cavernous, and supraclinoid segments widely patent without flow limiting stenosis. A1 segments patent. Anterior communicating artery normal. Anterior cerebral arteries widely patent. M1 segments patent without stenosis or occlusion. MCA bifurcations normal. Distal MCA branches well opacified and symmetric. POSTERIOR CIRCULATION: Vertebral arteries patent to the vertebrobasilar junction. Left vertebral artery is dominant. Posterior inferior cerebellar arteries are patent proximally. Basilar artery widely patent. Superior cerebral arteries patent bilaterally. Both of the posterior cerebral arteries are well opacified to their distal aspects. Small bilateral posterior communicating arteries noted. No aneurysm or vascular malformation. IMPRESSION: MRI HEAD IMPRESSION: 1. Normal Heather MRI for patient age. 2. Fluid level within the right maxillary sinus, suggestive of acute sinusitis. MRA HEAD IMPRESSION: Normal intracranial MRA. Electronically Signed   By: Jeannine Boga M.D.   On: 01/04/2016 05:18    ECG & Cardiac Imaging     EKG: SR with 1st degree AVB, old RBBB.  Echo: 07/17/13  Study Conclusions  - Left ventricle: The cavity size was normal. There was mild concentric hypertrophy. Systolic function was normal. The estimated ejection fraction was in the range of 55% to 60%. Mild hypokinesis of the inferior myocardium. - Left atrium: The atrium was moderately dilated.  Cath: 04/30/15  Conclusion   1. Mid RCA lesion, 100% stenosed. 2. Dist RCA lesion, 100% stenosed. 3. Prox Cx lesion, 100% stenosed. 4. Mid LAD to Dist LAD lesion, 90% stenosed. 5. SVG was injected is normal in caliber, and is anatomically normal. 6. SVG was injected . 7. There is severe diffuse disease in the graft. 8. Origin to Prox Graft lesion, 100% stenosed. 9. LIMA was injected is normal in caliber, and is anatomically normal. 10. Mid Graft-2 lesion, 99% stenosed. 11. Mid Graft-1 lesion, 50% stenosed. Post intervention, there is a 0% residual stenosis. The lesion was  previously treated with a stent (unknown type). 12. Prox Graft lesion, 70% stenosed. Post intervention, there is a 0% residual stenosis. 13. There is mild to moderate left ventricular systolic dysfunction.    Bypass graft occlusion total occlusion of the SVG to the circumflex, and high-grade obstruction in the SVG to the RCA.   The SVG to the RCA contains 50% in-stent restenosis, de novo 99% stenosis beyond the previously placed stent, and proximal intimal flap obstructing the vessel by 70%.  Widely patent LIMA to LAD  Severe diffuse high-grade disease in the proximal to mid LAD. Total occlusion of the proximal circumflex. Total occlusion of the mid native right coronary.  Severe inferior basal hypokinesis. LVEF mildly reduced at 35-40%. Normal left ventricular end-diastolic pressure.  Successful multi-site stenting of the saphenous vein graft to the right coronary, with reduction in proximal stenosis from 70 to 0% and reduction of distal stenoses from 50 and  99% to 0%. Distal protection was used with a 5.0 mm Spider device. Copious atheromatous material was retrieved in the basket.   Recommendations:   The patient is allergic to and refuses statin therapy. We need to consider [facility]-9 therapy.  Continue Plavix. Check P2 Y 12 assay     Assessment & Plan    82 yo female with PMH of CAD s/p CABG with MV repair (2003) s/p inferior STEMI (07/2013) s/p PCI to SVG to PL, pAF, HFpEF who presented to the ED with dyspnea and shoulder pain. Found to have an elevated troponin.   1. Elevated Trop: Reports she felt like her heart was pounding in her chest yesterday morning. Blood pressure was also noted to be elevated into the 789F systolic. Took an asa and nitro and presented to the ED. EKG non acute. Trop with mild flat elevation. Last cath this year showed re instent stenosis of SVG to RCA. Placed on ASA and Plavix. States this intermittent pain between her shoulders is not like the pain she had with previous stent placement. No arrhythmias on telemetry.   -- given findings will plan for lexiscan myoview tomorrow. NPO tonight.   2. Aphasia/ Stuttering Speech: Resolved. MRI negative. Neurology following.   3. PAF: This is noted in the chart, but not mentioned in previous notes. Denies having been in [facility] in the past. None noted on telemetry. Did report her heart pounding prior to admission. May need event monitor in the outpatient setting?  4. HFpEF: Euvolemic on exam  5. HTN: controlled.   Barnet Pall, NP-C Pager [phone] 01/04/2016, 4:41 PM As above, patient seen and examined. Briefly she is an 82 year old female with past medical history of coronary artery disease status post coronary artery bypass graft, mitral valve repair, hypertension, hyperlipidemia, diastolic congestive heart failure for evaluation of elevated troponin. Over the past several weeks the patient has had occasional mid back pain. It worsens in the evening. It is  not exertional. There is no associated symptoms. She has not had chest pain. She denies dyspnea. Patient developed anxiety yesterday associated with stuttering. She was felt to possibly have a stroke but MRI negative. She has improved. Troponin minimally elevated and cardiology asked to evaluate. Troponins have been 0.14, 0.15, 0.10 and 0.06. Electrocardiogram shows sinus rhythm with first degree AV block, right bundle branch block and inferior infarct. No change compared to previous.   1 elevated troponin/back pain-symptoms are atypical. Troponins are flat with no clear trend. Electrocardiogram shows no acute ST changes. Her symptoms are unlike her previous infarct pain.  I will arrange a  [place] nuclear study for risk stratification.  2 Coronary artery disease-she is intolerant to statins. Would continue aspirin and Plavix at discharge.  3 question TIA-management per neurology.  4 hypertension-blood pressure controlled. Continue present medications.  5 Paroxysmal atrial fibrillation-I have no documentation of atrial fibrillation either on telemetry or electrocardiogram. I do not have Dr. Thurman Coyer outside records available but previous notes state h/o "mild PAF". She will need close follow-up with him to address this. CHADSvasc 5. If she indeed does have a history of paroxysmal atrial fibrillation she will need long-term anticoagulation.  Kirk Ruths, MD

## 2016-01-04 NOTE — Evaluation (Signed)
Physical Therapy Evaluation/Discharge  Patient Details Name: Heather [name] MRN: [id number] DOB: Jul 20, 1930 Today's Date: 01/04/2016   History of Present Illness  Patient is a 82 year old female admitted 01/03/16 for dyspnea, anxiety, and shoulder blade pain. She was unable to speak and only stuttered upon admission. MRI negative. PMH includes CAD stenting 7/17, PAF, HLD, NSTEMI 04/2015, CAD with CABG x3 04/2015, HTN, mitral valve repair, obesity, arthritis, and CHF.   Clinical Impression  Patient presents with some unsteadiness without assistive device and lingering speech deficits, but is otherwise PLOF from accounts of daughter, son, and patient. She does not require further PT services due to her [facility] strength and safe ambulation with RW . Patient is frustrated by speech deficits, but these have improved since 01/03/16. Patient was educated on keeping RW with her during ambulation in home for safety, and needing assist while speech is impaired in case she needs to call for help. No further PT needs, patient and family are aware and agreeable.   HR pre-ambulation: 83 HR post-ambulation: 84     Follow Up Recommendations No PT follow up    Equipment Recommendations  None recommended by PT    Recommendations for Other Services       Precautions / Restrictions Precautions Precautions: Fall Restrictions Weight Bearing Restrictions: No      Mobility  Bed Mobility Overal bed mobility: Modified Independent Bed Mobility: Supine to Sit           General bed mobility comments: patient took increased time to come to EOB. Patient did not want PT assistance during bed mobility.   Transfers Overall transfer level: Modified independent   Transfers: Sit to/from Stand Sit to Stand: Modified independent (Device/Increase time)         General transfer comment: Patient stood prior to RW placement, and was able to stand independently without PT correction for 6 seconds. Reached for  environment, and RW placed in front of patient.    Ambulation/Gait Ambulation/Gait assistance: Modified independent (Device/Increase time) Ambulation Distance (Feet): 175 Feet Assistive device: Rolling walker (2 wheeled) Gait Pattern/deviations: Step-through pattern     General Gait Details: one cue for body placement in RW. Otherwise stable during ambulation.   Stairs            Wheelchair Mobility    Modified Rankin (Stroke Patients Only)       Balance Overall balance assessment: Needs assistance Sitting-balance support: Feet unsupported;No upper extremity supported Sitting balance-Leahy Scale: Good Sitting balance - Comments: patient sat independently on EOB    Standing balance support: Bilateral upper extremity supported;During functional activity Standing balance-Leahy Scale: Fair Standing balance comment: reaches for environment without assistive device                              Pertinent Vitals/Pain Pain Assessment: 0-10 Pain Score: 7  Pain Location: low back, from MRI   Pain Descriptors / Indicators: Aching Pain Intervention(s): Monitored during session    Home Living Family/patient expects to be discharged to:: Private residence Living Arrangements: Alone Available Help at Discharge: Family;Available PRN/intermittently Type of Home: House Home Access: Ramped entrance     Home Layout: One level Home Equipment: Walker - 2 wheels;Shower seat;Cane - single point;Bedside commode (rollator)      Prior Function Level of Independence: Independent with assistive device(s)         Comments: used RW and rollator at home  Hand Dominance   Dominant Hand: Right    Extremity/Trunk Assessment   Upper Extremity Assessment Upper Extremity Assessment: Overall WFL for tasks assessed    Lower Extremity Assessment Lower Extremity Assessment: RLE deficits/detail;LLE deficits/detail RLE: Unable to fully assess due to pain LLE Deficits /  Details: WFL hip flexion, knee extension, knee flexion, and hip abduction and adduction     Cervical / Trunk Assessment Cervical / Trunk Assessment: Normal  Communication   Communication: Expressive difficulties (patient stutters, but is able to convey speech with increased time )  Cognition Arousal/Alertness: Awake/alert Behavior During Therapy: WFL for tasks assessed/performed Overall Cognitive Status: Within Functional Limits for tasks assessed                      General Comments      Exercises     Assessment/Plan    PT Assessment Patent does not need any further PT services  PT Problem List            PT Treatment Interventions      PT Goals (Current goals can be found in the Care Plan section)  Acute Rehab PT Goals Patient Stated Goal: return to doing things for self  PT Goal Formulation: With patient Time For Goal Achievement: 01/11/16 Potential to Achieve Goals: Good    Frequency     Barriers to discharge        Co-evaluation               End of Session Equipment Utilized During Treatment: Gait belt Activity Tolerance: Patient tolerated treatment well;No increased pain Patient left: in chair;with call bell/phone within reach;with chair alarm set;with family/visitor present           Time: 2458-0998 PT Time Calculation (min) (ACUTE ONLY): 27 min   Charges:   PT Evaluation $PT Eval Low Complexity: 1 Procedure     PT G Codes:        Heather [name] 15-Jan-2016, 11:33 AM  Heather [name] SPT [phone]

## 2016-01-05 ENCOUNTER — Observation Stay (HOSPITAL_BASED_OUTPATIENT_CLINIC_OR_DEPARTMENT_OTHER): Payer: Medicare Other

## 2016-01-05 DIAGNOSIS — R748 Abnormal levels of other serum enzymes: Secondary | ICD-10-CM | POA: Diagnosis not present

## 2016-01-05 DIAGNOSIS — F410 Panic disorder [episodic paroxysmal anxiety] without agoraphobia: Secondary | ICD-10-CM | POA: Diagnosis not present

## 2016-01-05 DIAGNOSIS — M546 Pain in thoracic spine: Secondary | ICD-10-CM | POA: Diagnosis not present

## 2016-01-05 DIAGNOSIS — I2510 Atherosclerotic heart disease of native coronary artery without angina pectoris: Secondary | ICD-10-CM | POA: Diagnosis not present

## 2016-01-05 DIAGNOSIS — I214 Non-ST elevation (NSTEMI) myocardial infarction: Secondary | ICD-10-CM

## 2016-01-05 DIAGNOSIS — R778 Other specified abnormalities of plasma proteins: Secondary | ICD-10-CM | POA: Diagnosis not present

## 2016-01-05 LAB — NM MYOCAR MULTI W/SPECT W/WALL MOTION / EF
Estimated workload: 1 METS
Exercise duration (min): 0 min
Exercise duration (sec): 0 s
LV dias vol: 70 mL (ref 46–106)
LV sys vol: 23 mL
MPHR: 137 {beats}/min
Peak HR: 70 {beats}/min
Percent HR: 71 %
Percent of predicted max HR: 51 %
RATE: 0.39
RPE: 0
Rest HR: 77 {beats}/min
SDS: 4
SRS: 5
SSS: 9
Stage 1 DBP: 63 mmHg
Stage 1 Grade: 0 %
Stage 1 HR: 78 {beats}/min
Stage 1 SBP: 123 mmHg
Stage 1 Speed: 0 mph
Stage 2 Grade: 0 %
Stage 2 HR: 78 {beats}/min
Stage 2 Speed: 0 mph
Stage 3 Grade: 0 %
Stage 3 HR: 63 {beats}/min
Stage 3 Speed: 0 mph
Stage 4 DBP: 55 mmHg
Stage 4 Grade: 0 %
Stage 4 HR: 59 {beats}/min
Stage 4 SBP: 92 mmHg
Stage 4 Speed: 0 mph
Stage 5 DBP: 54 mmHg
Stage 5 Grade: 0 %
Stage 5 HR: 68 {beats}/min
Stage 5 SBP: 105 mmHg
Stage 5 Speed: 0 mph
Stage 6 Grade: 0 %
Stage 6 HR: 70 {beats}/min
Stage 6 Speed: 0 mph
TID: 1.22

## 2016-01-05 MED ORDER — TECHNETIUM TC 99M TETROFOSMIN IV KIT
30.0000 | PACK | Freq: Once | INTRAVENOUS | Status: AC | PRN
  Administered 2016-01-05: 30 via INTRAVENOUS

## 2016-01-05 MED ORDER — SERTRALINE HCL 25 MG PO TABS: 25.0000 mg | ORAL_TABLET | Freq: Every day | ORAL | 0 refills | Status: DC

## 2016-01-05 MED ORDER — REGADENOSON 0.4 MG/5ML IV SOLN
INTRAVENOUS | Status: AC
  Administered 2016-01-05: 0.4 mg via INTRAVENOUS
  Filled 2016-01-05: qty 5

## 2016-01-05 MED ORDER — FLUOXETINE HCL 10 MG PO CAPS
10.0000 mg | ORAL_CAPSULE | Freq: Every day | ORAL | Status: DC
  Administered 2016-01-05 – 2016-01-07 (×3): 10 mg via ORAL
  Filled 2016-01-05 (×3): qty 1

## 2016-01-05 MED ORDER — TECHNETIUM TC 99M TETROFOSMIN IV KIT
10.0000 | PACK | Freq: Once | INTRAVENOUS | Status: AC | PRN
  Administered 2016-01-05: 10 via INTRAVENOUS

## 2016-01-05 MED ORDER — LORAZEPAM 0.5 MG PO TABS: 0.5000 mg | ORAL_TABLET | Freq: Three times a day (TID) | ORAL | 0 refills | Status: DC | PRN

## 2016-01-05 MED ORDER — REGADENOSON 0.4 MG/5ML IV SOLN
0.4000 mg | Freq: Once | INTRAVENOUS | Status: AC
  Administered 2016-01-05: 0.4 mg via INTRAVENOUS

## 2016-01-05 NOTE — Progress Notes (Signed)
Called by nuc med tech to evaluate pt. Apparently upon insertion of IV catheter by tech, pt started having stuttering with incoherent speech and shaking of head and arms. Pt trying to communicate but unable to form clear words. VS obtained and RN from inpatient unit informed of this event. RN to speak with MD and come see pt. Support given

## 2016-01-05 NOTE — Progress Notes (Signed)
Speech Language Pathology Treatment: Cognitive-Linquistic  Patient Details Name: Heather [name] MRN: [id number] DOB: 1930/09/26 Today's Date: 01/05/2016 Time: 7035-0093 SLP Time Calculation (min) (ACUTE ONLY): 12 min  Assessment / Plan / Recommendation Clinical Impression  Patient in chair with son present. Patient communicates verbally, speech 95% intelligible with noted disfluencies: mild repetitions of initial consonants. Per chart and MD notes, normal brain MRI for age. Per MD patient with episodic stuttering which is felt to be psychogenic in nature, noted during panic attacks. She is being managed medically for anxiety (started on SSRI and low-dose benzodiazepam per MD). Patient reports her speech is "much better." Provided education to patient and family regarding improving functional communication during stuttering episodes, including reducing time pressure and using relaxation techniques. Patient demonstrates understanding and uses these techniques with initial instruction. No further follow-up with SLP recommended at this time. Patient and son are in agreement with this plan. SLP to sign off.     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 Her family states that when these would occur she would take her nitroglycerin and sit in a chair and the pain would resolve. This morning at around 5:30 she was awoken short of breath. Just before 6:00 she called her daughter and related that she was short of breath and anxious. Her daughter recommended she call 911, which she did. Soon  thereafter (just after 6 AM) she was unable to speak and was only able to stutter. Along with this stuttering was some shaking. EMS transferred her to the emergency department and she was admitted to the internal medicine teaching service for further evaluation and care. Apparently, she initially was felt to be having a panic attack. Of note, she has not been taking warfarin or a DOAC for her history of paroxysmal atrial fibrillation, but has been compliant with her Plavix and aspirin after her stenting in April. Her daughter states she has never seen her mother have a panic attack let alone an episode like this. According to the grandson, who works for the fire department in [facility] and has accompanied her in ambulance rides, notes that she will sometimes have panic attacks like this. Per notes, MRA 01/04/16 showed Normal brain MRI for patient age.      SLP Plan  Discharge SLP treatment due to (comment) (patient no longer appropriate for skilled SLP services)     Recommendations                   Plan: Discharge SLP treatment due to (comment) (patient no longer appropriate for skilled SLP services)       GO           Functional Assessment Tool Used: Skilled clinical observations Functional Limitations: Spoken language expressive Spoken Language Expression Current Status [phone]): At least 1 percent but less than 20 percent impaired, limited or restricted Spoken Language Expression Goal Status [phone]): At least 1 percent but less than 20 percent impaired, limited or restricted Spoken Language Expression Discharge Status [phone]): At least 1 percent but less than 20 percent impaired, limited or restricted   Heather Lever, MS CF-SLP Speech-Language  Pathologist   Heather [name] 01/05/2016, 4:44 PM

## 2016-01-05 NOTE — Discharge Summary (Signed)
Name: Heather [name] MRN: [id number] DOB: November 05, 1930 82 y.o. PCP: Leonard Downing, MD  Date of Admission: 01/03/2016  7:08 AM Date of Discharge: 01/07/2016 Attending Physician: Oval Linsey MD  Discharge Diagnosis: 1. Intermittent stuttering secondary to anxiety  2.  Coronary Artery disease with acutely elevated troponins  Active Problems:   Hyperlipidemia   Hypertension, uncontrolled   Dyspnea   Anxiety attack   PAF (paroxysmal atrial fibrillation) (HCC)   Aphasia   Other hyperlipidemia   Elevated troponin   Discharge Medications: Allergies as of 01/07/2016      Reactions   Atorvastatin    Muscle ache   Codeine Nausea And Vomiting   Crestor [rosuvastatin]    Muscle ache   Pravastatin    Muscle ache   Simvastatin    Muscle cramps   Ticagrelor    Dyspnea      Medication List    TAKE these medications   acetaminophen 500 MG tablet Commonly known as:  TYLENOL Take 500 mg by mouth every 6 (six) hours as needed for moderate pain.   aspirin EC 81 MG tablet Take 81 mg by mouth daily.   calcium-vitamin D 500-200 MG-UNIT tablet Commonly known as:  OSCAL WITH D Take 1 tablet by mouth daily.   clopidogrel 75 MG tablet Commonly known as:  PLAVIX Take 1 tablet (75 mg total) by mouth daily.   levothyroxine 50 MCG tablet Commonly known as:  SYNTHROID, LEVOTHROID Take 50 mcg by mouth daily.   LORazepam 0.5 MG tablet Commonly known as:  ATIVAN Take 1 tablet (0.5 mg total) by mouth every 8 (eight) hours as needed for anxiety.   losartan 50 MG tablet Commonly known as:  COZAAR Take 1 tablet (50 mg total) by mouth daily.   magnesium oxide 400 MG tablet Commonly known as:  MAG-OX Take 250 mg by mouth daily.   nitroGLYCERIN 0.4 MG SL tablet Commonly known as:  NITROSTAT Place 0.4 mg under the tongue every 5 (five) minutes as needed for chest pain.   ranitidine 150 MG tablet Commonly known as:  ZANTAC Take 150 mg by mouth daily as needed for  heartburn.   sertraline 25 MG tablet Commonly known as:  ZOLOFT Take 1 tablet (25 mg total) by mouth daily.       Disposition and follow-up:   [name] was discharged from [facility] in stable condition.  At the hospital follow up visit please address:  1.  SSRI compliance and need for increase in zoloft dose, Patient reports allergy to statin. PCSK-9 was recommended in the past. Need to discuss this further at follow up.   2.  Labs / imaging needed at time of follow-up: none  3.  Pending labs/ test needing follow-up: none  Follow-up Appointments: Follow-up Information    Leonard Downing, MD Follow up.   Specialty:  Family Medicine Contact information: [address] [place] [address] [phone]        Dr. Wynonia Lawman. Call.   Why:  Call your cardiologist after discharge for a hospital follow up appointment          Hospital Course by problem list: Active Problems:   Hyperlipidemia   Hypertension, uncontrolled   Dyspnea   Anxiety attack   PAF (paroxysmal atrial fibrillation) (HCC)   Aphasia   Other hyperlipidemia   Elevated troponin   1. Intermittent stuttering secondary to anxiety  Patient presented to the ED with expressive aphasia and there was concern for  an acute thrombotic stroke with history of paroxysmal atrial fibrillation not on warfarin or a DOAC. An MRI and MRA of the head was ordered and showed normal intracranial imaging.  Neurology evaluated patient and with a normal EEG, thought that her stuttering speech was likely psychogenic in nature.  Patient had another episode of stuttering during her hospitalization that resolved with Ativan and rest.  Apparently patient's brother died 2 weeks ago and she has held all of this in until recently. It appears that patient is having panic attacks from this recent event.  She was agreeable to starting an SSRI with Ativan as needed. She was started on Prozac in the hospital  and switched to Zoloft 25mg  daily with 0.5 mg Ativan as needed every 8 hours.   2.  Coronary Artery disease with acutely elevated troponins Patient also presented with dyspnea and intermittent shoulder blade pain. Her point-of-care troponin was negative however subsequent troponin was elevated at 0.14.  Her EKG showed normal sinus rhythm at 96 bpm, first-degree AV block, right bundle branch block, normal axis, Q waves in III, aVF, and V1, no LVH by voltage, unchanged from the previous ECG on 05/01/2015.Cardiology was consulted and recommended a Lexiscan which suggested Moderate size and intensity, reversible (SDS 5) lateral wall perfusion defect suggestive of LCX/OM or diagonal territory ischemia. LVEF 67% with normal wall motion. This was an intermediate risk study.  Patient had a cardiac cath with results unchanged from previous cardiac In April 2017. The lateral ischemia seen on the nuclear stress test was related to occlusion of her left circumflex as well as the saphenous vein graft to the obtuse marginal. Recommendations included continuing medical therapy.  She was advised to follow up with Dr. Wynonia Lawman after discharge.  3.  Hypothyroidism  Patient is on synthroid and TSH was checked.  TSH was within normal limits.  4.  Hypertension Blood pressure's were stable and home Cozaar was continued.   Discharge Vitals:   BP (!) 151/39 (BP Location: Right Arm)   Pulse 79   Temp 97.7 F (36.5 C) (Oral)   Resp 18   Wt 181 lb 14.1 oz (82.5 kg)   SpO2 96%   BMI 36.74 kg/m   Pertinent Labs, Studies, and Procedures:  Lexiscan, cardiac cath  Discharge Instructions: Discharge Instructions    Diet - low sodium heart healthy    Complete by:  As directed    Increase activity slowly    Complete by:  As directed       Signed: Valinda Party, DO 01/11/2016, 5:53 PM   Pager: [phone]

## 2016-01-05 NOTE — Progress Notes (Signed)
Internal Medicine Attending  Date: 01/05/2016  Patient name: Heather [name] Medical record number: [id number] Date of birth: 04-Apr-1930 Age: 82 y.o. Gender: female  I saw and evaluated the patient. I reviewed the resident's note by Dr. Young Berry and I agree with the resident's findings and plans as documented in her progress note.  When seen on rounds initially this morning Heather [name] was doing well and speaking without difficulty. While down getting her nuclear medicine scan she had another episode of stuttering. She was given Ativan with some improvement and completed the nuclear medicine scan. The results of the scan are pending at this time. When we reevaluated Heather [name] upon her arrival back to the ward she continued to stutter but was able to express herself with formed words. This panic attack is much less in severity than the one that brought her into the hospital. She is now willing to start an SSRI for the anxiety and we will give her a low-dose benzodiazepine as needed until the SSRI kicks in. As suspected, more is coming out about what is driving this. Apparently her brother died 2 weeks ago and she has held all of this in until recently. If the nuclear medicine scan is without evidence of reversible ischemia she should be stable for discharge home with follow-up in her cardiologist's and primary care provider's office.

## 2016-01-05 NOTE — Progress Notes (Signed)
Positive nuc study.  Dr. Stanford Breed to address.

## 2016-01-05 NOTE — Discharge Instructions (Signed)
Heather [name],  Please follow up with your primary care doctor in 1 week to determine if you need an increase in your anxiety medicine (zoloft). Please follow up with your cardiologist after discharge.

## 2016-01-05 NOTE — Progress Notes (Signed)
Observed patient in nuc med, patient was given 0.5mg  IV ativan. Her anxiety seemed to calm but her speech did not return back to baseline. They were going to continue with exam.  Cyndia Bent RN

## 2016-01-05 NOTE — Progress Notes (Signed)
lexiscan myoview portion complete without cardiac complications. She was aphasic at one point.

## 2016-01-05 NOTE — Progress Notes (Signed)
Patient Name: Heather [name] Date of Encounter: 01/05/2016  Primary Cardiologist: Dr. Anthoney Harada Problem List     Active Problems:   Hyperlipidemia   Hypertension, uncontrolled   Dyspnea   Anxiety attack   PAF (paroxysmal atrial fibrillation) (HCC)   Aphasia   Other hyperlipidemia   Elevated troponin     Subjective   Cannot speak. Stuttering and incoherent speech   Inpatient Medications    Scheduled Meds: . clopidogrel  75 mg Oral Daily  . enoxaparin (LOVENOX) injection  40 mg Subcutaneous Q24H  . levothyroxine  50 mcg Oral QAC breakfast  . regadenoson       Continuous Infusions:  PRN Meds: acetaminophen, nitroGLYCERIN   Vital Signs    Vitals:   01/04/16 1500 01/04/16 2023 01/05/16 0500 01/05/16 0848  BP: (!) 116/52 (!) 151/63 124/66 (!) 167/77  Pulse: 70 69 66 (!) 103  Resp: 18 18 15    Temp: 98.6 F (37 C) 98 F (36.7 C) 97.5 F (36.4 C)   TempSrc: Oral Oral Oral   SpO2: 97% 95% 97% 99%    Intake/Output Summary (Last 24 hours) at 01/05/16 1016 Last data filed at 01/04/16 1700  Gross per 24 hour  Intake              480 ml  Output                0 ml  Net              480 ml   There were no vitals filed for this visit.  Physical Exam   GEN: Well nourished, well developed, in no acute distress. Obese, stuttering nonsensical spech HEENT: Grossly normal.  Neck: Supple, no JVD, carotid bruits, or masses. Cardiac: RRR, no murmurs, rubs, or gallops. No clubbing, cyanosis, edema.  Radials/DP/PT 2+ and equal bilaterally.  Respiratory:  Respirations regular and unlabored, clear to auscultation bilaterally. GI: Soft, nontender, nondistended, BS + x 4. MS: no deformity or atrophy. Skin: warm and dry, no rash. Neuro:  Strength and sensation are intact. Psych: AAOx3.  Normal affect.  Labs    CBC  Recent Labs  01/03/16 0729 01/03/16 1231  WBC 5.7 5.9  HGB 12.7 12.1  HCT 36.3 35.8*  MCV 85.2 85.2  PLT 191 440   Basic Metabolic  Panel  Recent Labs  01/03/16 0729 01/04/16 0227  NA 138 138  K 3.9 3.9  CL 103 103  CO2 22 26  GLUCOSE 120* 114*  BUN 16 15  CREATININE 0.82 0.76  CALCIUM 9.9 9.7   Liver Function Tests No results for input(s): AST, ALT, ALKPHOS, BILITOT, PROT, ALBUMIN in the last 72 hours. No results for input(s): LIPASE, AMYLASE in the last 72 hours. Cardiac Enzymes  Recent Labs  01/03/16 1231 01/03/16 1820 01/04/16 0027  TROPONINI 0.15* 0.10* 0.06*   BNP Invalid input(s): POCBNP D-Dimer No results for input(s): DDIMER in the last 72 hours. Hemoglobin A1C No results for input(s): HGBA1C in the last 72 hours. Fasting Lipid Panel No results for input(s): CHOL, HDL, LDLCALC, TRIG, CHOLHDL, LDLDIRECT in the last 72 hours. Thyroid Function Tests  Recent Labs  01/03/16 1231  TSH 0.740    Telemetry    NSR with PACs - Personally Reviewed   Radiology    Mr Virgel Paling Wo Contrast  Result Date: 01/04/2016 CLINICAL DATA:  Initial evaluation for acute stuttering speech, aphasia EXAM: MRI HEAD WITHOUT CONTRAST MRA HEAD WITHOUT CONTRAST TECHNIQUE: Multiplanar, multiecho pulse  sequences of the brain and surrounding structures were obtained without intravenous contrast. Angiographic images of the head were obtained using MRA technique without contrast. COMPARISON:  Prior MRI from 07/19/2014. FINDINGS: MRI HEAD FINDINGS Brain: Cerebral volume within normal limits for age. Minimal patchy T2/FLAIR hyperintensity within the periventricular and deep white matter both cerebral hemispheres, most like related to chronic small vessel ischemic disease, felt to be within normal limits for age. No abnormal foci of restricted diffusion to suggest acute or subacute ischemia. Gray-white matter differentiation is well maintained. No evidence for acute or chronic intracranial hemorrhage. No evidence for chronic infarction. No mass lesion, midline shift, or mass effect. No hydrocephalus. No extra-axial fluid  collection. Major dural sinuses are patent. Incidental note made of a partially empty sella. Midline structures intact. Vascular: Major intracranial vascular flow voids are maintained. Skull and upper cervical spine: Craniocervical junction within normal limits. Bone marrow signal intensity within normal limits. No scalp soft tissue abnormality. Sinuses/Orbits: Globes and orbital soft tissues within normal limits. Patient is status post lens extraction bilaterally. Scattered mucosal thickening throughout the paranasal sinuses. Fluid level within the right maxillary sinus suggest acute sinusitis. Small bilateral mastoid effusions, left greater than right. Inner ear structures normal. MRA HEAD FINDINGS ANTERIOR CIRCULATION: Visualized distal cervical segments of the internal carotid arteries are widely patent with antegrade flow. Petrous, cavernous, and supraclinoid segments widely patent without flow limiting stenosis. A1 segments patent. Anterior communicating artery normal. Anterior cerebral arteries widely patent. M1 segments patent without stenosis or occlusion. MCA bifurcations normal. Distal MCA branches well opacified and symmetric. POSTERIOR CIRCULATION: Vertebral arteries patent to the vertebrobasilar junction. Left vertebral artery is dominant. Posterior inferior cerebellar arteries are patent proximally. Basilar artery widely patent. Superior cerebral arteries patent bilaterally. Both of the posterior cerebral arteries are well opacified to their distal aspects. Small bilateral posterior communicating arteries noted. No aneurysm or vascular malformation. IMPRESSION: MRI HEAD IMPRESSION: 1. Normal brain MRI for patient age. 2. Fluid level within the right maxillary sinus, suggestive of acute sinusitis. MRA HEAD IMPRESSION: Normal intracranial MRA. Electronically Signed   By: Jeannine Boga M.D.   On: 01/04/2016 05:18   Mr Brain Wo Contrast  Result Date: 01/04/2016 CLINICAL DATA:  Initial  evaluation for acute stuttering speech, aphasia EXAM: MRI HEAD WITHOUT CONTRAST MRA HEAD WITHOUT CONTRAST TECHNIQUE: Multiplanar, multiecho pulse sequences of the brain and surrounding structures were obtained without intravenous contrast. Angiographic images of the head were obtained using MRA technique without contrast. COMPARISON:  Prior MRI from 07/19/2014. FINDINGS: MRI HEAD FINDINGS Brain: Cerebral volume within normal limits for age. Minimal patchy T2/FLAIR hyperintensity within the periventricular and deep white matter both cerebral hemispheres, most like related to chronic small vessel ischemic disease, felt to be within normal limits for age. No abnormal foci of restricted diffusion to suggest acute or subacute ischemia. Gray-white matter differentiation is well maintained. No evidence for acute or chronic intracranial hemorrhage. No evidence for chronic infarction. No mass lesion, midline shift, or mass effect. No hydrocephalus. No extra-axial fluid collection. Major dural sinuses are patent. Incidental note made of a partially empty sella. Midline structures intact. Vascular: Major intracranial vascular flow voids are maintained. Skull and upper cervical spine: Craniocervical junction within normal limits. Bone marrow signal intensity within normal limits. No scalp soft tissue abnormality. Sinuses/Orbits: Globes and orbital soft tissues within normal limits. Patient is status post lens extraction bilaterally. Scattered mucosal thickening throughout the paranasal sinuses. Fluid level within the right maxillary sinus suggest acute sinusitis. Small bilateral  mastoid effusions, left greater than right. Inner ear structures normal. MRA HEAD FINDINGS ANTERIOR CIRCULATION: Visualized distal cervical segments of the internal carotid arteries are widely patent with antegrade flow. Petrous, cavernous, and supraclinoid segments widely patent without flow limiting stenosis. A1 segments patent. Anterior communicating  artery normal. Anterior cerebral arteries widely patent. M1 segments patent without stenosis or occlusion. MCA bifurcations normal. Distal MCA branches well opacified and symmetric. POSTERIOR CIRCULATION: Vertebral arteries patent to the vertebrobasilar junction. Left vertebral artery is dominant. Posterior inferior cerebellar arteries are patent proximally. Basilar artery widely patent. Superior cerebral arteries patent bilaterally. Both of the posterior cerebral arteries are well opacified to their distal aspects. Small bilateral posterior communicating arteries noted. No aneurysm or vascular malformation. IMPRESSION: MRI HEAD IMPRESSION: 1. Normal brain MRI for patient age. 2. Fluid level within the right maxillary sinus, suggestive of acute sinusitis. MRA HEAD IMPRESSION: Normal intracranial MRA. Electronically Signed   By: Jeannine Boga M.D.   On: 01/04/2016 05:18    Cardiac Studies   2D ECHO: 07/17/2013 LV EF: 55% -  60% Study Conclusion - Left ventricle: The cavity size was normal. There was mild concentric hypertrophy. Systolic function was normal. The estimated ejection fraction was in the range of 55% to 60%. Mild hypokinesis of the inferior myocardium. - Left atrium: The atrium was moderately dilated.   04/30/15 Coronary Stent Intervention  Left Heart Cath and Cors/Grafts Angiography  Conclusion 1. Mid RCA lesion, 100% stenosed. 2. Dist RCA lesion, 100% stenosed. 3. Prox Cx lesion, 100% stenosed. 4. Mid LAD to Dist LAD lesion, 90% stenosed. 5. SVG was injected is normal in caliber, and is anatomically normal. 6. SVG was injected . 7. There is severe diffuse disease in the graft. 8. Origin to Prox Graft lesion, 100% stenosed. 9. LIMA was injected is normal in caliber, and is anatomically normal. 10. Mid Graft-2 lesion, 99% stenosed. 11. Mid Graft-1 lesion, 50% stenosed. Post intervention, there is a 0% residual stenosis. The lesion was previously treated with a  stent (unknown type). 12. Prox Graft lesion, 70% stenosed. Post intervention, there is a 0% residual stenosis. 13. There is mild to moderate left ventricular systolic dysfunction.    Bypass graft occlusion total occlusion of the SVG to the circumflex, and high-grade obstruction in the SVG to the RCA.   The SVG to the RCA contains 50% in-stent restenosis, de novo 99% stenosis beyond the previously placed stent, and proximal intimal flap obstructing the vessel by 70%.  Widely patent LIMA to LAD  Severe diffuse high-grade disease in the proximal to mid LAD. Total occlusion of the proximal circumflex. Total occlusion of the mid native right coronary.  Severe inferior basal hypokinesis. LVEF mildly reduced at 35-40%. Normal left ventricular end-diastolic pressure.  Successful multi-site stenting of the saphenous vein graft to the right coronary, with reduction in proximal stenosis from 70 to 0% and reduction of distal stenoses from 50 and 99% to 0%. Distal protection was used with a 5.0 mm Spider device. Copious atheromatous material was retrieved in the basket Recommendations:  The patient is allergic to and refuses statin therapy. We need to consider PCSK-9 therapy.  Continue Plavix. Check P2Y12 assay.       Patient Profile     82 yo female with PMH of CAD s/p CABG with MV repair (2003) s/p inferior STEMI (07/2013) s/p PCI to SVG to PL, pAF, HFpEF who presented to the ED with dyspnea and shoulder pain. Found to have an elevated troponin.  Assessment & Plan    1. Elevated Trop: trop with mild flat elevation. Last cath this year showed re instent stenosis of SVG to RCA. Placed on ASA and Plavix. States this intermittent pain between her shoulders is not like the pain she had with previous stent placement. No arrhythmias on telemetry.   -- Given findings undergoing lexiscan myoview today. Await nuclear images.   2. Aphasia/ Stuttering Speech: had another episode this AM in nuc med.  MRI and EEG it has been shown she did not have a CVA and her stuttering speech was likely psychogenic in nature  3. PAF: This is noted in the chart, but not mentioned in previous notes. Denies having been in [facility] in the past. None noted on telemetry, but frequent PACs. Did report her heart pounding prior to admission. Discussed with Dr. Wynonia Lawman over the phone. She had an episode of brief post op afib after CABG with no recurrence. She wore an event monitor in 2016 for syncope and palpitations which showed NSR with PACs but no afib. No OAC indicated.  4. HFpEF: Euvolemic on exam  5. HTN: controlled.     Signed, Angelena Form, PA-C  01/05/2016, 10:16 AM  As above patient seen and examined; had another episode of speech difficulty; further eval per neurology. No atrial fibrillation on telemetry and in discussions with Dr Wynonia Lawman, pt only had post op atrial fibrillation following CABG; will not anticoagulate. Await results of nuclear study; if negative she can be DCed from a cardiac standpoint on preadmission meds and fu with Dr Wynonia Lawman. Kirk Ruths, MD

## 2016-01-05 NOTE — Progress Notes (Signed)
   Subjective: Patient was evaluated this morning on rounds. She had finished taking a shower and was about to be taken down for her stress test. She seemed in good spirits. She denied any chest pain, heart palpitations or shortness of breath.  She denied any more pain between her shoulder blades.  Objective:  Vital signs in last 24 hours: Vitals:   01/05/16 1027 01/05/16 1029 01/05/16 1031 01/05/16 1033  BP:  (!) 92/55 (!) 96/54 (!) 105/54  Pulse: (!) 55 63 82 65  Resp:      Temp:      TempSrc:      SpO2:       Physical Exam  Constitutional: She is well-developed, well-nourished, and in no distress.  Speaking in complete sentences  Cardiovascular: Normal rate, regular rhythm and normal heart sounds.  Exam reveals no gallop and no friction rub.   No murmur heard. Pulmonary/Chest: Effort normal and breath sounds normal. No respiratory distress. She has no wheezes. She has no rales.  Musculoskeletal: She exhibits no edema.     Assessment/Plan:  Active Problems:   Hyperlipidemia   Hypertension, uncontrolled   Dyspnea   Anxiety attack   PAF (paroxysmal atrial fibrillation) (HCC)   Aphasia   Other hyperlipidemia   Elevated troponin  Aphagia Patient denies any episodes of chest pain, palpitations or shortness of breath overnight.  Patient had an episode of aphasia while at her stress test.  She was given 0.5mg  IV ativan with some benefit.  She was able to continue the exam and the lexiscan myoview portion was without cardiac complications.  Her sudden episodes of aphasia may be panic attacks.  Patient has declined management for possible panic attacks.  This can be further addressed outpatient with her PCP.  - Ativan 0.5mg  PRN  Elevated Troponin with Back Pain Troponin mildly elevated at 0.14>0.15>0.10>0.06 on admission.  Lexiscan myoview portion without cardiac complications today, awaiting nuclear images.  If nuclear images negative can be discharged and follow up with Dr.  Wynonia Lawman.  CAD s/p CABG in the remote past and multiple stent on April 2017 Need to rule out ACS as above.  - cont asa, palvix. - Allergic to statin. PCSK-9 was recommended in the past. Need to discuss this further outpatient.  HTN  BP 105/54 and stable.  -continue cozaar.  Hypothyroidism  TSH wnl -Continue synthroid.  Dispo: Anticipated discharge pending nuclear medicine study results.   Valinda Party, DO 01/05/2016, 10:48 AM Pager: [phone]

## 2016-01-05 NOTE — Progress Notes (Addendum)
Received a call from nuc med stating patient was having an episode of incoherent speech. Paged teaching services, spoke with Dr. Janett Billow and she said to give patient 0.5 of ativan. Going down to observe patient now.  Cyndia Bent RN

## 2016-01-06 ENCOUNTER — Encounter (HOSPITAL_COMMUNITY)
Admission: EM | Disposition: A | Discharge status: Home / Self Care | Payer: Self-pay | Source: Home / Self Care | Attending: Emergency Medicine

## 2016-01-06 DIAGNOSIS — R748 Abnormal levels of other serum enzymes: Secondary | ICD-10-CM | POA: Diagnosis not present

## 2016-01-06 DIAGNOSIS — I480 Paroxysmal atrial fibrillation: Secondary | ICD-10-CM | POA: Diagnosis not present

## 2016-01-06 DIAGNOSIS — R778 Other specified abnormalities of plasma proteins: Secondary | ICD-10-CM | POA: Diagnosis not present

## 2016-01-06 DIAGNOSIS — I2510 Atherosclerotic heart disease of native coronary artery without angina pectoris: Secondary | ICD-10-CM | POA: Diagnosis not present

## 2016-01-06 DIAGNOSIS — M546 Pain in thoracic spine: Secondary | ICD-10-CM | POA: Diagnosis not present

## 2016-01-06 DIAGNOSIS — F410 Panic disorder [episodic paroxysmal anxiety] without agoraphobia: Secondary | ICD-10-CM | POA: Diagnosis not present

## 2016-01-06 HISTORY — PX: CARDIAC CATHETERIZATION: SHX172

## 2016-01-06 LAB — CBC
HCT: 32.1 % — ABNORMAL LOW (ref 36.0–46.0)
Hemoglobin: 11.1 g/dL — ABNORMAL LOW (ref 12.0–15.0)
MCH: 29.5 pg (ref 26.0–34.0)
MCHC: 34.6 g/dL (ref 30.0–36.0)
MCV: 85.4 fL (ref 78.0–100.0)
Platelets: 163 10*3/uL (ref 150–400)
RBC: 3.76 MIL/uL — ABNORMAL LOW (ref 3.87–5.11)
RDW: 13.6 % (ref 11.5–15.5)
WBC: 5.4 10*3/uL (ref 4.0–10.5)

## 2016-01-06 LAB — CREATININE, SERUM
Creatinine, Ser: 0.8 mg/dL (ref 0.44–1.00)
GFR calc Af Amer: >60 mL/min (ref >60)
GFR calc non Af Amer: >60 mL/min (ref >60)

## 2016-01-06 LAB — GLUCOSE, CAPILLARY: Glucose-Capillary: 109 mg/dL — ABNORMAL HIGH (ref 65–99)

## 2016-01-06 SURGERY — LEFT HEART CATH AND CORS/GRAFTS ANGIOGRAPHY

## 2016-01-06 MED ORDER — SODIUM CHLORIDE 0.9 % IV SOLN: 250.0000 mL | INTRAVENOUS | Status: DC | PRN

## 2016-01-06 MED ORDER — SODIUM CHLORIDE 0.9% FLUSH: 3.0000 mL | INTRAVENOUS | Status: DC | PRN

## 2016-01-06 MED ORDER — LIDOCAINE HCL (PF) 1 % IJ SOLN
INTRAMUSCULAR | Status: DC | PRN
  Administered 2016-01-06: 15 mL

## 2016-01-06 MED ORDER — ONDANSETRON HCL 4 MG/2ML IJ SOLN: 4.0000 mg | Freq: Four times a day (QID) | INTRAMUSCULAR | Status: DC | PRN

## 2016-01-06 MED ORDER — HEPARIN (PORCINE) IN NACL 2-0.9 UNIT/ML-% IJ SOLN
INTRAMUSCULAR | Status: DC | PRN
  Administered 2016-01-06: 1000 mL

## 2016-01-06 MED ORDER — SODIUM CHLORIDE 0.9 % WEIGHT BASED INFUSION: 1.0000 mL/kg/h | INTRAVENOUS | Status: DC

## 2016-01-06 MED ORDER — LIDOCAINE HCL (PF) 1 % IJ SOLN
INTRAMUSCULAR | Status: AC
  Filled 2016-01-06: qty 30

## 2016-01-06 MED ORDER — MIDAZOLAM HCL 2 MG/2ML IJ SOLN
INTRAMUSCULAR | Status: AC
  Filled 2016-01-06: qty 2

## 2016-01-06 MED ORDER — MIDAZOLAM HCL 2 MG/2ML IJ SOLN
INTRAMUSCULAR | Status: DC | PRN
  Administered 2016-01-06: 1 mg via INTRAVENOUS

## 2016-01-06 MED ORDER — IOPAMIDOL (ISOVUE-370) INJECTION 76%
INTRAVENOUS | Status: AC
  Filled 2016-01-06: qty 100

## 2016-01-06 MED ORDER — FENTANYL CITRATE (PF) 100 MCG/2ML IJ SOLN
INTRAMUSCULAR | Status: AC
  Filled 2016-01-06: qty 2

## 2016-01-06 MED ORDER — SODIUM CHLORIDE 0.9% FLUSH: 3.0000 mL | Freq: Two times a day (BID) | INTRAVENOUS | Status: DC

## 2016-01-06 MED ORDER — ASPIRIN 81 MG PO CHEW
81.0000 mg | CHEWABLE_TABLET | ORAL | Status: AC
  Administered 2016-01-06: 81 mg via ORAL
  Filled 2016-01-06: qty 1

## 2016-01-06 MED ORDER — SODIUM CHLORIDE 0.9 % WEIGHT BASED INFUSION
3.0000 mL/kg/h | INTRAVENOUS | Status: DC
  Administered 2016-01-06: 3 mL/kg/h via INTRAVENOUS

## 2016-01-06 MED ORDER — HEPARIN (PORCINE) IN NACL 2-0.9 UNIT/ML-% IJ SOLN
INTRAMUSCULAR | Status: AC
Start: 2016-01-06 — End: 2016-01-06
  Filled 2016-01-06: qty 1000

## 2016-01-06 MED ORDER — ENOXAPARIN SODIUM 40 MG/0.4ML ~~LOC~~ SOLN: 40.0000 mg | SUBCUTANEOUS | Status: DC

## 2016-01-06 MED ORDER — FENTANYL CITRATE (PF) 100 MCG/2ML IJ SOLN
INTRAMUSCULAR | Status: DC | PRN
  Administered 2016-01-06: 25 ug via INTRAVENOUS

## 2016-01-06 MED ORDER — SODIUM CHLORIDE 0.9 % WEIGHT BASED INFUSION: 3.0000 mL/kg/h | INTRAVENOUS | Status: DC

## 2016-01-06 MED ORDER — SODIUM CHLORIDE 0.9% FLUSH
3.0000 mL | Freq: Two times a day (BID) | INTRAVENOUS | Status: DC
  Administered 2016-01-06: 3 mL via INTRAVENOUS

## 2016-01-06 MED ORDER — SODIUM CHLORIDE 0.9 % WEIGHT BASED INFUSION: 1.0000 mL/kg/h | INTRAVENOUS | Status: AC

## 2016-01-06 MED ORDER — IOPAMIDOL (ISOVUE-370) INJECTION 76%
INTRAVENOUS | Status: AC
  Filled 2016-01-06: qty 125

## 2016-01-06 MED ORDER — ACETAMINOPHEN 325 MG PO TABS: 650.0000 mg | ORAL_TABLET | ORAL | Status: DC | PRN

## 2016-01-06 MED ORDER — ENOXAPARIN SODIUM 40 MG/0.4ML ~~LOC~~ SOLN
40.0000 mg | SUBCUTANEOUS | Status: DC
  Administered 2016-01-06: 40 mg via SUBCUTANEOUS
  Filled 2016-01-06: qty 0.4

## 2016-01-06 SURGICAL SUPPLY — 9 items
CATH INFINITI 5FR MULTPACK ANG (CATHETERS) ×1 IMPLANT
KIT HEART LEFT (KITS) ×2 IMPLANT
PACK CARDIAC CATHETERIZATION (CUSTOM PROCEDURE TRAY) ×2 IMPLANT
SHEATH PINNACLE 5F 10CM (SHEATH) ×1 IMPLANT
SYR MEDRAD MARK V 150ML (SYRINGE) ×2 IMPLANT
TRANSDUCER W/STOPCOCK (MISCELLANEOUS) ×2 IMPLANT
TUBING CIL FLEX 10 FLL-RA (TUBING) ×2 IMPLANT
WIRE EMERALD 3MM-J .035X150CM (WIRE) ×1 IMPLANT
WIRE HI TORQ VERSACORE-J 145CM (WIRE) ×1 IMPLANT

## 2016-01-06 NOTE — Progress Notes (Signed)
   01/04/16 1007  PT G-Codes **NOT FOR INPATIENT CLASS**  Functional Assessment Tool Used clinical judgement  Functional Limitation Mobility: Walking and moving around  Mobility: Walking and Moving Around Current Status [phone]) CI  Mobility: Walking and Moving Around Goal Status [phone]) CI  Mobility: Walking and Moving Around Discharge Status [phone]) CI  Addendum to 01/04/16 Physical therapy note Heather [name], [place]

## 2016-01-06 NOTE — Progress Notes (Signed)
Patient Name: Heather [name] Date of Encounter: 01/06/2016  Primary Cardiologist: Dr. Anthoney Harada Problem List     Active Problems:   Hyperlipidemia   Hypertension, uncontrolled   Dyspnea   Anxiety attack   PAF (paroxysmal atrial fibrillation) (HCC)   Aphasia   Other hyperlipidemia   Elevated troponin     Subjective   No chest pain or dyspnea  Inpatient Medications    Scheduled Meds: . clopidogrel  75 mg Oral Daily  . enoxaparin (LOVENOX) injection  40 mg Subcutaneous Q24H  . FLUoxetine  10 mg Oral Daily  . levothyroxine  50 mcg Oral QAC breakfast   Continuous Infusions:  PRN Meds: acetaminophen, nitroGLYCERIN   Vital Signs    Vitals:   01/05/16 1033 01/05/16 1515 01/05/16 2024 01/06/16 0532  BP: (!) 105/54 (!) 124/58 (!) 132/56 (!) 152/54  Pulse: 65 81 65 61  Resp:  18 18 18   Temp:  98 F (36.7 C) 98 F (36.7 C) 97.9 F (36.6 C)  TempSrc:  Oral Oral Oral  SpO2:  94% 98% 98%   No intake or output data in the 24 hours ending 01/06/16 0929 There were no vitals filed for this visit.  Physical Exam   GEN: Well nourished, obese in no acute distress.  HEENT: Grossly normal.  Neck: Supple Cardiac: RRR Respiratory:  CTA GI: Soft, nontender, nondistended. MS: no deformity or atrophy. Skin: warm and dry, no rash. Neuro:  Strength and sensation are intact. Psych: AAOx3.  Normal affect.  Labs    CBC  Recent Labs  01/03/16 1231  WBC 5.9  HGB 12.1  HCT 35.8*  MCV 85.2  PLT 161   Basic Metabolic Panel  Recent Labs  01/04/16 0227  NA 138  K 3.9  CL 103  CO2 26  GLUCOSE 114*  BUN 15  CREATININE 0.76  CALCIUM 9.7   Cardiac Enzymes  Recent Labs  01/03/16 1231 01/03/16 1820 01/04/16 0027  TROPONINI 0.15* 0.10* 0.06*   Thyroid Function Tests  Recent Labs  01/03/16 1231  TSH 0.740    Telemetry    NSR with PACs - Personally Reviewed   Radiology    Nm Myocar Multi W/spect W/wall Motion / Ef  Result Date:  01/05/2016  There was no ST segment deviation noted during stress.  No T wave inversion was noted during stress.  Defect 1: There is a medium defect of moderate severity.  Findings consistent with ischemia.  This is an intermediate risk study.  Nuclear stress EF: 67%.  Moderate size and intensity, reversible (SDS 5) lateral wall perfusion defect suggestive of LCX/OM or diagonal territory ischemia. LVEF 67% with normal wall motion. This is an intermediate risk study.    Cardiac Studies   2D ECHO: 07/17/2013 LV EF: 55% -  60% Study Conclusion - Left ventricle: The cavity size was normal. There was mild concentric hypertrophy. Systolic function was normal. The estimated ejection fraction was in the range of 55% to 60%. Mild hypokinesis of the inferior myocardium. - Left atrium: The atrium was moderately dilated.   04/30/15 Coronary Stent Intervention  Left Heart Cath and Cors/Grafts Angiography  Conclusion 1. Mid RCA lesion, 100% stenosed. 2. Dist RCA lesion, 100% stenosed. 3. Prox Cx lesion, 100% stenosed. 4. Mid LAD to Dist LAD lesion, 90% stenosed. 5. SVG was injected is normal in caliber, and is anatomically normal. 6. SVG was injected . 7. There is severe diffuse disease in the graft. 8. Origin to Prox Graft  lesion, 100% stenosed. 9. LIMA was injected is normal in caliber, and is anatomically normal. 10. Mid Graft-2 lesion, 99% stenosed. 11. Mid Graft-1 lesion, 50% stenosed. Post intervention, there is a 0% residual stenosis. The lesion was previously treated with a stent (unknown type). 12. Prox Graft lesion, 70% stenosed. Post intervention, there is a 0% residual stenosis. 13. There is mild to moderate left ventricular systolic dysfunction.    Bypass graft occlusion total occlusion of the SVG to the circumflex, and high-grade obstruction in the SVG to the RCA.   The SVG to the RCA contains 50% in-stent restenosis, de novo 99% stenosis beyond the previously placed  stent, and proximal intimal flap obstructing the vessel by 70%.  Widely patent LIMA to LAD  Severe diffuse high-grade disease in the proximal to mid LAD. Total occlusion of the proximal circumflex. Total occlusion of the mid native right coronary.  Severe inferior basal hypokinesis. LVEF mildly reduced at 35-40%. Normal left ventricular end-diastolic pressure.  Successful multi-site stenting of the saphenous vein graft to the right coronary, with reduction in proximal stenosis from 70 to 0% and reduction of distal stenoses from 50 and 99% to 0%. Distal protection was used with a 5.0 mm Spider device. Copious atheromatous material was retrieved in the basket Recommendations:  The patient is allergic to and refuses statin therapy. We need to consider PCSK-9 therapy.  Continue Plavix. Check P2Y12 assay.       Patient Profile     82 yo female with PMH of CAD s/p CABG with MV repair (2003) s/p inferior STEMI (07/2013) s/p PCI to SVG to PL, pAF, HFpEF who presented to the ED with dyspnea and shoulder pain. Found to have an elevated troponin. Nuclear study with lateral ischemia   Assessment & Plan    1. Elevated Trop/back pain/abnormal nuclear study: trop with mild flat elevation. Nuclear study with lateral ischemia. Plan cardiac catheterization. The risks and benefits including myocardial infarction, CVA and death are discussed and she agrees to proceed.  2. Aphasia/ Stuttering Speech: MRI and EEG with no significant abnormalities and events felt likely psychogenic in nature by neurology.  3. PAF:  Discussed with Dr. Wynonia Lawman over the phone previously. She had an episode of brief post op afib after CABG with no recurrence. She wore an event monitor in 2016 for syncope and palpitations which showed NSR with PACs but no afib. No OAC indicated.  4. HFpEF: Euvolemic on exam  5. HTN: controlled.   6. CAD s/p CABG: continue plavix  Signed, Kirk Ruths, MD  01/06/2016, 9:29 AM

## 2016-01-06 NOTE — H&P (View-Only) (Signed)
Patient Name: Heather [name] Date of Encounter: 01/06/2016  Primary Cardiologist: Dr. Anthoney Harada Problem List     Active Problems:   Hyperlipidemia   Hypertension, uncontrolled   Dyspnea   Anxiety attack   PAF (paroxysmal atrial fibrillation) (HCC)   Aphasia   Other hyperlipidemia   Elevated troponin     Subjective   No chest pain or dyspnea  Inpatient Medications    Scheduled Meds: . clopidogrel  75 mg Oral Daily  . enoxaparin (LOVENOX) injection  40 mg Subcutaneous Q24H  . FLUoxetine  10 mg Oral Daily  . levothyroxine  50 mcg Oral QAC breakfast   Continuous Infusions:  PRN Meds: acetaminophen, nitroGLYCERIN   Vital Signs    Vitals:   01/05/16 1033 01/05/16 1515 01/05/16 2024 01/06/16 0532  BP: (!) 105/54 (!) 124/58 (!) 132/56 (!) 152/54  Pulse: 65 81 65 61  Resp:  18 18 18   Temp:  98 F (36.7 C) 98 F (36.7 C) 97.9 F (36.6 C)  TempSrc:  Oral Oral Oral  SpO2:  94% 98% 98%   No intake or output data in the 24 hours ending 01/06/16 0929 There were no vitals filed for this visit.  Physical Exam   GEN: Well nourished, obese in no acute distress.  HEENT: Grossly normal.  Neck: Supple Cardiac: RRR Respiratory:  CTA GI: Soft, nontender, nondistended. MS: no deformity or atrophy. Skin: warm and dry, no rash. Neuro:  Strength and sensation are intact. Psych: AAOx3.  Normal affect.  Labs    CBC  Recent Labs  01/03/16 1231  WBC 5.9  HGB 12.1  HCT 35.8*  MCV 85.2  PLT 607   Basic Metabolic Panel  Recent Labs  01/04/16 0227  NA 138  K 3.9  CL 103  CO2 26  GLUCOSE 114*  BUN 15  CREATININE 0.76  CALCIUM 9.7   Cardiac Enzymes  Recent Labs  01/03/16 1231 01/03/16 1820 01/04/16 0027  TROPONINI 0.15* 0.10* 0.06*   Thyroid Function Tests  Recent Labs  01/03/16 1231  TSH 0.740    Telemetry    NSR with PACs - Personally Reviewed   Radiology    Nm Myocar Multi W/spect W/wall Motion / Ef  Result Date:  01/05/2016  There was no ST segment deviation noted during stress.  No T wave inversion was noted during stress.  Defect 1: There is a medium defect of moderate severity.  Findings consistent with ischemia.  This is an intermediate risk study.  Nuclear stress EF: 67%.  Moderate size and intensity, reversible (SDS 5) lateral wall perfusion defect suggestive of LCX/OM or diagonal territory ischemia. LVEF 67% with normal wall motion. This is an intermediate risk study.    Cardiac Studies   2D ECHO: 07/17/2013 LV EF: 55% -  60% Study Conclusion - Left ventricle: The cavity size was normal. There was mild concentric hypertrophy. Systolic function was normal. The estimated ejection fraction was in the range of 55% to 60%. Mild hypokinesis of the inferior myocardium. - Left atrium: The atrium was moderately dilated.   04/30/15 Coronary Stent Intervention  Left Heart Cath and Cors/Grafts Angiography  Conclusion 1. Mid RCA lesion, 100% stenosed. 2. Dist RCA lesion, 100% stenosed. 3. Prox Cx lesion, 100% stenosed. 4. Mid LAD to Dist LAD lesion, 90% stenosed. 5. SVG was injected is normal in caliber, and is anatomically normal. 6. SVG was injected . 7. There is severe diffuse disease in the graft. 8. Origin to Prox Graft  lesion, 100% stenosed. 9. LIMA was injected is normal in caliber, and is anatomically normal. 10. Mid Graft-2 lesion, 99% stenosed. 11. Mid Graft-1 lesion, 50% stenosed. Post intervention, there is a 0% residual stenosis. The lesion was previously treated with a stent (unknown type). 12. Prox Graft lesion, 70% stenosed. Post intervention, there is a 0% residual stenosis. 13. There is mild to moderate left ventricular systolic dysfunction.    Bypass graft occlusion total occlusion of the SVG to the circumflex, and high-grade obstruction in the SVG to the RCA.   The SVG to the RCA contains 50% in-stent restenosis, de novo 99% stenosis beyond the previously placed  stent, and proximal intimal flap obstructing the vessel by 70%.  Widely patent LIMA to LAD  Severe diffuse high-grade disease in the proximal to mid LAD. Total occlusion of the proximal circumflex. Total occlusion of the mid native right coronary.  Severe inferior basal hypokinesis. LVEF mildly reduced at 35-40%. Normal left ventricular end-diastolic pressure.  Successful multi-site stenting of the saphenous vein graft to the right coronary, with reduction in proximal stenosis from 70 to 0% and reduction of distal stenoses from 50 and 99% to 0%. Distal protection was used with a 5.0 mm Spider device. Copious atheromatous material was retrieved in the basket Recommendations:  The patient is allergic to and refuses statin therapy. We need to consider PCSK-9 therapy.  Continue Plavix. Check P2Y12 assay.       Patient Profile     82 yo female with PMH of CAD s/p CABG with MV repair (2003) s/p inferior STEMI (07/2013) s/p PCI to SVG to PL, pAF, HFpEF who presented to the ED with dyspnea and shoulder pain. Found to have an elevated troponin. Nuclear study with lateral ischemia   Assessment & Plan    1. Elevated Trop/back pain/abnormal nuclear study: trop with mild flat elevation. Nuclear study with lateral ischemia. Plan cardiac catheterization. The risks and benefits including myocardial infarction, CVA and death are discussed and she agrees to proceed.  2. Aphasia/ Stuttering Speech: MRI and EEG with no significant abnormalities and events felt likely psychogenic in nature by neurology.  3. PAF:  Discussed with Dr. Wynonia Lawman over the phone previously. She had an episode of brief post op afib after CABG with no recurrence. She wore an event monitor in 2016 for syncope and palpitations which showed NSR with PACs but no afib. No OAC indicated.  4. HFpEF: Euvolemic on exam  5. HTN: controlled.   6. CAD s/p CABG: continue plavix  Signed, Kirk Ruths, MD  01/06/2016, 9:29 AM

## 2016-01-06 NOTE — Progress Notes (Signed)
   Subjective: Patient was evaluated this morning on rounds. She was speaking in complete sentences with no stuttering. She reports feeling well and denied any shortness of breath or chest pain.  Objective:  Vital signs in last 24 hours: Vitals:   01/05/16 1033 01/05/16 1515 01/05/16 2024 01/06/16 0532  BP: (!) 105/54 (!) 124/58 (!) 132/56 (!) 152/54  Pulse: 65 81 65 61  Resp:  18 18 18   Temp:  98 F (36.7 C) 98 F (36.7 C) 97.9 F (36.6 C)  TempSrc:  Oral Oral Oral  SpO2:  94% 98% 98%   Physical Exam  Constitutional: She is well-developed, well-nourished, and in no distress.  Cardiovascular: Normal rate, regular rhythm and normal heart sounds.  Exam reveals no gallop and no friction rub.   No murmur heard. Pulmonary/Chest: Effort normal and breath sounds normal. No respiratory distress. She has no wheezes. She has no rales.  Musculoskeletal: She exhibits no edema.  Skin: Skin is warm and dry.    Assessment/Plan:  Active Problems:   Hyperlipidemia   Hypertension, uncontrolled   Dyspnea   Anxiety attack   PAF (paroxysmal atrial fibrillation) (HCC)   Aphasia   Other hyperlipidemia   Elevated troponin  Stuttering This morning patient was speaking in complete sentences without stuttering. Her episode of stuttering yesterday improved over the course of the afternoon. By this morning she reports waking up without having difficulty speaking. While discussing her nuclear study results she started developing mild stuttering for a few words but quickly resolved. I suspect that the news made her a bit anxious. On discharge she has a prescription for Zoloft 25 mg and Ativan 0.5 mg and will be told to follow-up with primary care doctor for medicine adjustments. - Ativan 0.5mg  PRN  Elevated Troponin with Back Pain Troponin mildly elevated at 0.14>0.15>0.10>0.06 on admission.  Nuclear stress test results suggested Moderate size and intensity, reversible (SDS 5) lateral wall perfusion  defect suggestive of LCX/OM or diagonal territory ischemia. LVEF 67% with normal wall motion. This is an intermediate risk study.  Patient was kept overnight due to results. - Appreciate Cardiology's recommendations    CAD s/p CABG in the remote past and multiple stent on April 2017  - cont asa, palvix. - Allergic to statin. PCSK-9 was recommended in the past. Need to discuss this further outpatient.  HTN  BP 152/54 and stable.  -continue cozaar.  Hypothyroidism  TSH wnl -Continue synthroid.  Dispo: Anticipated discharge pending Cardiology recommendations.   Valinda Party, DO 01/06/2016, 9:41 AM Pager: [phone]

## 2016-01-06 NOTE — Progress Notes (Signed)
Internal Medicine Attending  Date: 01/06/2016  Patient name: Heather [name] Medical record number: [id number] Date of birth: 1930/11/05 Age: 82 y.o. Gender: female  I saw and evaluated the patient. I reviewed the resident's note by Dr. Young Berry and I agree with the resident's findings and plans as documented in her progress note.  When seen on rounds this morning [name] was without any stuttering. Cardiology took her to catheterization today and the results were unchanged from the April 2017 cardiac cath. It is felt that the lateral ischemia seen on the nuclear stress test was related to occlusion of her left circumflex as well as the saphenous vein graft to the obtuse marginal. Continued medical therapy was recommended and will be followed. I anticipate she will be ready for discharge home tomorrow.

## 2016-01-06 NOTE — Progress Notes (Signed)
Removal of right femoral arterial sheath without complication. Manual compression was obtained by Ayesha Mohair, RN. Right groin stable with no oozing, bruising or hematoma present. Pulses present +1, BP: 139/61, SP02: 95%. Dressing applied to site with 4x4 gauze to puncture site and tegaderm to secure. Dressing dry and intact. Patient understands recovery process and limitations.

## 2016-01-06 NOTE — Interval H&P Note (Signed)
Cath Lab Visit (complete for each Cath Lab visit)  Clinical Evaluation Leading to the Procedure:   ACS: Yes.    Non-ACS:    Anginal Classification: CCS IV  Anti-ischemic medical therapy: Minimal Therapy (1 class of medications)  Non-Invasive Test Results: Intermediate-risk stress test findings: cardiac mortality 1-3%/year  Prior CABG: Previous CABG      History and Physical Interval Note:  01/06/2016 2:48 PM  Heather [name]  has presented today for surgery, with the diagnosis of cp  The various methods of treatment have been discussed with the patient and family. After consideration of risks, benefits and other options for treatment, the patient has consented to  Procedure(s): Left Heart Cath and Cors/Grafts Angiography (N/A) as a surgical intervention .  The patient's history has been reviewed, patient examined, no change in status, stable for surgery.  I have reviewed the patient's chart and labs.  Questions were answered to the patient's satisfaction.     Larae Grooms

## 2016-01-07 ENCOUNTER — Encounter (HOSPITAL_COMMUNITY): Payer: Self-pay | Admitting: Interventional Cardiology

## 2016-01-07 DIAGNOSIS — R4701 Aphasia: Secondary | ICD-10-CM | POA: Diagnosis not present

## 2016-01-07 DIAGNOSIS — F410 Panic disorder [episodic paroxysmal anxiety] without agoraphobia: Secondary | ICD-10-CM | POA: Diagnosis not present

## 2016-01-07 DIAGNOSIS — I2510 Atherosclerotic heart disease of native coronary artery without angina pectoris: Secondary | ICD-10-CM | POA: Diagnosis not present

## 2016-01-07 DIAGNOSIS — R748 Abnormal levels of other serum enzymes: Secondary | ICD-10-CM | POA: Diagnosis not present

## 2016-01-07 DIAGNOSIS — R778 Other specified abnormalities of plasma proteins: Secondary | ICD-10-CM | POA: Diagnosis not present

## 2016-01-07 LAB — GLUCOSE, CAPILLARY
Glucose-Capillary: 114 mg/dL — ABNORMAL HIGH (ref 65–99)
Glucose-Capillary: 133 mg/dL — ABNORMAL HIGH (ref 65–99)

## 2016-01-07 NOTE — Progress Notes (Signed)
Patient Name: Heather [name] Date of Encounter: 01/07/2016  Primary Cardiologist: Dr. Anthoney Harada Problem List     Active Problems:   Hyperlipidemia   Hypertension, uncontrolled   Dyspnea   Anxiety attack   PAF (paroxysmal atrial fibrillation) (HCC)   Aphasia   Other hyperlipidemia   Elevated troponin     Subjective   No chest pain or dyspnea  Inpatient Medications    Scheduled Meds: . clopidogrel  75 mg Oral Daily  . enoxaparin (LOVENOX) injection  40 mg Subcutaneous Q24H  . FLUoxetine  10 mg Oral Daily  . levothyroxine  50 mcg Oral QAC breakfast  . sodium chloride flush  3 mL Intravenous Q12H   Continuous Infusions:  PRN Meds: sodium chloride, acetaminophen, nitroGLYCERIN, ondansetron (ZOFRAN) IV, sodium chloride flush   Vital Signs    Vitals:   01/06/16 1700 01/06/16 1832 01/06/16 1951 01/07/16 0439  BP: (!) 142/65 (!) 149/51 124/70 130/68  Pulse: 70  65 70  Resp:  12 14 16   Temp:   97.8 F (36.6 C) 97.8 F (36.6 C)  TempSrc:   Oral Oral  SpO2:  94% 96% 97%  Weight:        Intake/Output Summary (Last 24 hours) at 01/07/16 0947 Last data filed at 01/06/16 1303  Gross per 24 hour  Intake                0 ml  Output              301 ml  Net             -301 ml   Filed Weights   01/06/16 1000  Weight: 181 lb 14.1 oz (82.5 kg)    Physical Exam   GEN: Well nourished, obese in no acute distress.  HEENT: Grossly normal.  Neck: Supple Cardiac: RRR Respiratory:  CTA GI: Soft, nontender, nondistended. MS: no deformity or atrophy. Skin: warm and dry, no rash. Neuro:  Strength and sensation are intact. Psych: AAOx3.  Normal affect.  Labs    CBC  Recent Labs  01/06/16 1851  WBC 5.4  HGB 11.1*  HCT 32.1*  MCV 85.4  PLT 614   Basic Metabolic Panel  Recent Labs  01/06/16 1851  CREATININE 0.80    Telemetry    Sinus brady with pacs - Personally Reviewed   Radiology    Nm Myocar Multi W/spect W/wall Motion /  Ef  Result Date: 01/05/2016  There was no ST segment deviation noted during stress.  No T wave inversion was noted during stress.  Defect 1: There is a medium defect of moderate severity.  Findings consistent with ischemia.  This is an intermediate risk study.  Nuclear stress EF: 67%.  Moderate size and intensity, reversible (SDS 5) lateral wall perfusion defect suggestive of LCX/OM or diagonal territory ischemia. LVEF 67% with normal wall motion. This is an intermediate risk study.    Cardiac Studies   2D ECHO: 07/17/2013 LV EF: 55% -  60% Study Conclusion - Left ventricle: The cavity size was normal. There was mild concentric hypertrophy. Systolic function was normal. The estimated ejection fraction was in the range of 55% to 60%. Mild hypokinesis of the inferior myocardium. - Left atrium: The atrium was moderately dilated.   04/30/15 Coronary Stent Intervention  Left Heart Cath and Cors/Grafts Angiography  Conclusion 1. Mid RCA lesion, 100% stenosed. 2. Dist RCA lesion, 100% stenosed. 3. Prox Cx lesion, 100% stenosed. 4. Mid LAD to  Dist LAD lesion, 90% stenosed. 5. SVG was injected is normal in caliber, and is anatomically normal. 6. SVG was injected . 7. There is severe diffuse disease in the graft. 8. Origin to Prox Graft lesion, 100% stenosed. 9. LIMA was injected is normal in caliber, and is anatomically normal. 10. Mid Graft-2 lesion, 99% stenosed. 11. Mid Graft-1 lesion, 50% stenosed. Post intervention, there is a 0% residual stenosis. The lesion was previously treated with a stent (unknown type). 12. Prox Graft lesion, 70% stenosed. Post intervention, there is a 0% residual stenosis. 13. There is mild to moderate left ventricular systolic dysfunction.    Bypass graft occlusion total occlusion of the SVG to the circumflex, and high-grade obstruction in the SVG to the RCA.   The SVG to the RCA contains 50% in-stent restenosis, de novo 99% stenosis beyond the  previously placed stent, and proximal intimal flap obstructing the vessel by 70%.  Widely patent LIMA to LAD  Severe diffuse high-grade disease in the proximal to mid LAD. Total occlusion of the proximal circumflex. Total occlusion of the mid native right coronary.  Severe inferior basal hypokinesis. LVEF mildly reduced at 35-40%. Normal left ventricular end-diastolic pressure.  Successful multi-site stenting of the saphenous vein graft to the right coronary, with reduction in proximal stenosis from 70 to 0% and reduction of distal stenoses from 50 and 99% to 0%. Distal protection was used with a 5.0 mm Spider device. Copious atheromatous material was retrieved in the basket Recommendations:  The patient is allergic to and refuses statin therapy. We need to consider PCSK-9 therapy.  Continue Plavix. Check P2Y12 assay.       Patient Profile     82 yo female with PMH of CAD s/p CABG with MV repair (2003) s/p inferior STEMI (07/2013) s/p PCI to SVG to PL, pAF, HFpEF who presented to the ED with dyspnea and shoulder pain. Found to have an elevated troponin. Nuclear study with lateral ischemia   Assessment & Plan    1. Elevated Trop/back pain/abnormal nuclear study: trop with mild flat elevation. Nuclear study with lateral ischemia. Cardiac cath unchanged; plan medical therapy.  2. Aphasia/ Stuttering Speech: MRI and EEG with no significant abnormalities and events felt likely psychogenic in nature by neurology.  3. PAF:  Discussed with Dr. Wynonia Lawman over the phone previously. She had an episode of brief post op afib after CABG with no recurrence. She wore an event monitor in 2016 for syncope and palpitations which showed NSR with PACs but no afib. No OAC indicated.  4. HFpEF: Euvolemic on exam  5. HTN: controlled.   6. CAD s/p CABG: continue plavix Pt can be DCed from a cardiac standpoint and fu with Dr Wynonia Lawman. Signed, Kirk Ruths, MD  01/07/2016, 9:47 AM

## 2016-01-07 NOTE — Progress Notes (Signed)
Internal Medicine Attending  Date: 01/07/2016  Patient name: Heather [name] Medical record number: [id number] Date of birth: 05-15-1930 Age: 82 y.o. Gender: female  I saw and evaluated the patient. I reviewed the resident's note by Dr. Young Berry and I agree with the resident's findings and plans as documented in her progress note.  When seen on rounds this morning Heather [name] was feeling well and was without complaints. Her speech was clear without any stuttering. Cardiac catheterization revealed no changes and the plan is for continued medical therapy for her underlying coronary artery disease. She will be discharged home today with follow-up in her primary care provider's office.

## 2016-01-07 NOTE — Progress Notes (Signed)
   Subjective: The patient was evaluated this morning on rounds. She appeared in good spirits. She denied any shortness of breath, chest pain or stuttering overnight. She had a Cardiac Cath yesterday and denies any issues after the procedure.  Objective:  Vital signs in last 24 hours: Vitals:   01/06/16 1700 01/06/16 1832 01/06/16 1951 01/07/16 0439  BP: (!) 142/65 (!) 149/51 124/70 130/68  Pulse: 70  65 70  Resp:  12 14 16   Temp:   97.8 F (36.6 C) 97.8 F (36.6 C)  TempSrc:   Oral Oral  SpO2:  94% 96% 97%  Weight:       Physical Exam  Constitutional: She is well-developed, well-nourished, and in no distress.  Cardiovascular: Normal rate, regular rhythm and normal heart sounds.  Exam reveals no gallop and no friction rub.   No murmur heard. Pulmonary/Chest: Effort normal and breath sounds normal. No respiratory distress. She has no wheezes. She has no rales.   Assessment/Plan:  Active Problems:   Hyperlipidemia   Hypertension, uncontrolled   Dyspnea   Anxiety attack   PAF (paroxysmal atrial fibrillation) (HCC)   Aphasia   Other hyperlipidemia   Elevated troponin  Stuttering Patient denies any stuttering events overnight. Patient has received 3 doses of prozac while in the hospital and will be switched to Zoloft on discharge.   She has a prescription for Zoloft 25 mg and Ativan 0.5 mg and was told to follow-up with primary care doctor for medicine adjustments. - Ativan 0.5mg  PRN  Elevated Troponin  Cardiac Cath results yesterday were unchanged from April 2017 cardiac cath.  The lateral ischemia seen on the nuclear stress test was related to occlusion of her left circumflex as well as the saphenous vein graft to the obtuse marginal.  Recommendations included continuing medical therapy - Discharge today  CAD s/p CABG in the remote past and multiple stent on April 2017  - aspirin and palvix. - Allergic to statin. PCSK-9 was recommended in the past. Need to discuss this  further outpatient.  HTN  BP 130/68 and stable.  -continuecozaar.  Hypothyroidism  TSH wnl -Continue synthroid.  Dispo: Anticipated discharge today.   Valinda Party, DO 01/07/2016, 12:00 PM Pager: [phone]

## 2016-03-31 ENCOUNTER — Emergency Department (HOSPITAL_COMMUNITY)
Admission: EM | Admit: 2016-03-31 | Discharge: 2016-04-01 | Disposition: A | Discharge status: Home / Self Care | Payer: Medicare Other | Attending: Emergency Medicine | Admitting: Emergency Medicine

## 2016-03-31 ENCOUNTER — Encounter (HOSPITAL_COMMUNITY): Payer: Self-pay | Admitting: Emergency Medicine

## 2016-03-31 DIAGNOSIS — R079 Chest pain, unspecified: Secondary | ICD-10-CM | POA: Diagnosis present

## 2016-03-31 DIAGNOSIS — E039 Hypothyroidism, unspecified: Secondary | ICD-10-CM | POA: Diagnosis not present

## 2016-03-31 DIAGNOSIS — Z951 Presence of aortocoronary bypass graft: Secondary | ICD-10-CM | POA: Insufficient documentation

## 2016-03-31 DIAGNOSIS — I5032 Chronic diastolic (congestive) heart failure: Secondary | ICD-10-CM | POA: Insufficient documentation

## 2016-03-31 DIAGNOSIS — I2510 Atherosclerotic heart disease of native coronary artery without angina pectoris: Secondary | ICD-10-CM | POA: Diagnosis not present

## 2016-03-31 DIAGNOSIS — Z87891 Personal history of nicotine dependence: Secondary | ICD-10-CM | POA: Insufficient documentation

## 2016-03-31 DIAGNOSIS — I110 Hypertensive heart disease with heart failure: Secondary | ICD-10-CM | POA: Insufficient documentation

## 2016-03-31 DIAGNOSIS — I252 Old myocardial infarction: Secondary | ICD-10-CM | POA: Diagnosis not present

## 2016-03-31 DIAGNOSIS — Z7982 Long term (current) use of aspirin: Secondary | ICD-10-CM | POA: Diagnosis not present

## 2016-03-31 DIAGNOSIS — Z79899 Other long term (current) drug therapy: Secondary | ICD-10-CM | POA: Diagnosis not present

## 2016-03-31 MED ORDER — ASPIRIN 81 MG PO CHEW: 324.0000 mg | CHEWABLE_TABLET | Freq: Once | ORAL | Status: DC

## 2016-03-31 NOTE — ED Triage Notes (Signed)
Per EMS pt from home. Chest pain awoke from sleep around 10 pm.  Pt took 1 nitro, called EMS and family. Family arrived and gave 324 ASA.  Pt has no chest pain at this time. Family at bedside.  Pt has history of stents in the past.

## 2016-03-31 NOTE — ED Provider Notes (Addendum)
[place] DEPT Provider Note   CSN: [id number] Arrival date & time: 03/31/16  2336  By signing my name below, I, [name], attest that this documentation has been prepared under the direction and in the presence of Dorie Rank, MD . Electronically Signed: [name], Scribe. 03/31/2016. 11:44 PM.  History   Chief Complaint Chief Complaint  Patient presents with  . Chest Pain   The history is provided by the patient. No language interpreter was used.    HPI Comments:  Heather [name] is a 83 y.o. female with pmhx of CAD, CHF, HTN and NSTEMI who presents to the Emergency Department complaining of sudden-onset, currently-resolved chest pain which began a few hours ago. Pt states that she was woken up with a feeling that her "heart was sore". She reports associated palpitations and SOB which are also currently resolved. Pt took 4 aspirin and a nitroglycerin at home which completely relieved her symptoms. She notes she has felt similar symptoms in the past, which led to her having cardiac stents put in place. Pt denies any nausea or diaphoresis.   Past Medical History:  Diagnosis Date  . Arthritis   . CAD (coronary artery disease), native coronary artery 07/14/2013   Catheterization August/2003 showing severe three-vessel disease and moderate mitral regurgitation 08/28/2001 CABG with internal mammary graft to LAD, vein graft to circumflex, and vein graft to posterolateral branch and mitral valve repair by Dr. 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 Patent internal mammary graft with competitive flow in the distal vessel, occluded right coronary artery, occluded circumflex, patent saphenous vein graft to circumflex with mild/moderate disease proximally, minimal LV damage   . Chronic diastolic CHF (congestive heart failure) (HCC)    a. EF 60% by LV gram 01/2009.  . Coronary artery disease    a.  s/p MI in 1980;  b. 2003 CABG x 3 (LIMA->LAD, VG->OM, VG->RPL);  c. 01/2009 Cath: 3/3 patent grafts, native 3VD, EF 60%.  Marland Kitchen GERD (gastroesophageal reflux disease)   . H/O mitral valve repair    a. 2003  . Hyperlipidemia 07/14/2013   Intolerance to multiple statins including Crestor, pravastatin, and Lipitor   . Hypertension   . Hypothyroidism   . NSTEMI (non-ST elevated myocardial infarction) ([place])   . PAF (paroxysmal atrial fibrillation) ([place])   . S/P partial hysterectomy 1977    Patient Active Problem List   Diagnosis Date Noted  . Elevated troponin   . Dyspnea 01/03/2016  . Anxiety attack 01/03/2016  . PAF (paroxysmal atrial fibrillation) ([place]) 01/03/2016  . Aphasia   . Other hyperlipidemia   . NSTEMI (non-ST elevated myocardial infarction) ([place]) 04/30/2015  . Hypertension, uncontrolled 07/19/2014  . H/O mitral valve repair   . CAD (coronary artery disease) of artery bypass graft, s/p PCI stent to VG->RCA 04/30/15 07/14/2013  . Obesity (BMI 30-39.9) 07/14/2013  . Hyperlipidemia 07/14/2013  . Hypothyroidism 07/14/2013  . Hypertensive heart disease 07/14/2013    Past Surgical History:  Procedure Laterality Date  . ABDOMINAL HYSTERECTOMY  1977  . APPENDECTOMY  1950  . BLADDER SUSPENSION     tac  . CARDIAC CATHETERIZATION  03,05,11  . CARDIAC CATHETERIZATION N/A 04/30/2015   Procedure: Left Heart Cath and Cors/Grafts Angiography;  Surgeon: Belva Crome, MD;  Location: [facility];  Service: Cardiovascular;  Laterality: N/A;  . CARDIAC CATHETERIZATION N/A 04/30/2015   Procedure: Coronary Stent Intervention;  Surgeon: Mallie Mussel  Nicholes Stairs, MD;  Location: [facility];  Service: Cardiovascular;  Laterality: N/A;  Proximal and Distal SVG to the PLA  . CARDIAC CATHETERIZATION N/A 01/06/2016   Procedure: Left Heart Cath and Cors/Grafts Angiography;  Surgeon: Jettie Booze, MD;  Location: [facility];  Service: Cardiovascular;  Laterality: N/A;  . CARPAL TUNNEL  RELEASE     right and left  . CHOLECYSTECTOMY    . CORONARY ARTERY BYPASS GRAFT  2003  . KNEE ARTHROSCOPY     left  . LEFT HEART CATHETERIZATION WITH CORONARY ANGIOGRAM N/A 07/12/2013   Procedure: LEFT HEART CATHETERIZATION WITH CORONARY ANGIOGRAM;  Surgeon: Jettie Booze, MD;  Location: [facility] CATH LAB;  Service: Cardiovascular;  Laterality: N/A;  . MITRAL VALVE REPAIR  2003   with cabg  . THYROIDECTOMY  1968  . THYROIDECTOMY, PARTIAL     left    OB History    No data available       Home Medications    Prior to Admission medications   Medication Sig Start Date End Date Taking? Authorizing Provider  acetaminophen (TYLENOL) 500 MG tablet Take 500 mg by mouth every 6 (six) hours as needed for moderate pain.   Yes Historical Provider, MD  aspirin EC 81 MG tablet Take 81 mg by mouth daily.   Yes Historical Provider, MD  calcium-vitamin D (OSCAL WITH D) 500-200 MG-UNIT per tablet Take 1 tablet by mouth daily.   Yes Historical Provider, MD  clopidogrel (PLAVIX) 75 MG tablet Take 1 tablet (75 mg total) by mouth daily. 07/17/13  Yes Jacolyn Reedy, MD  levothyroxine (SYNTHROID, LEVOTHROID) 50 MCG tablet Take 50 mcg by mouth daily.   Yes Historical Provider, MD  LORazepam (ATIVAN) 0.5 MG tablet Take 1 tablet (0.5 mg total) by mouth every 8 (eight) hours as needed for anxiety. 01/05/16  Yes Jessica Ratliff Hoffman, DO  losartan (COZAAR) 50 MG tablet Take 1 tablet (50 mg total) by mouth daily. 07/20/14  Yes Ripudeep Krystal Eaton, MD  magnesium oxide (MAG-OX) 400 MG tablet Take 250 mg by mouth daily.    Yes Historical Provider, MD  nitroGLYCERIN (NITROSTAT) 0.4 MG SL tablet Place 0.4 mg under the tongue every 5 (five) minutes as needed for chest pain.   Yes Historical Provider, MD  ranitidine (ZANTAC) 150 MG tablet Take 150 mg by mouth daily as needed for heartburn.    Yes Historical Provider, MD    Family History Family History  Problem Relation Age of Onset  . Other      no premature CAD.     Social History Social History  Substance Use Topics  . Smoking status: Former Smoker    Quit date: 06/06/1967  . Smokeless tobacco: Former Systems developer  . Alcohol use No     Allergies   Atorvastatin; Codeine; Crestor [rosuvastatin]; Pravastatin; Simvastatin; and Ticagrelor   Review of Systems Review of Systems  Constitutional: Negative for diaphoresis.  Respiratory: Positive for shortness of breath.   Cardiovascular: Positive for chest pain and palpitations.  Gastrointestinal: Negative for nausea.  All other systems reviewed and are negative.    Physical Exam Updated Vital Signs BP 137/62   Pulse 78   Temp 97.7 F (36.5 C) (Oral)   Resp 17   SpO2 99%   Physical Exam  Constitutional: No distress.  Elderly  HENT:  Head: Normocephalic and atraumatic.  Right Ear: External ear normal.  Left Ear: External ear normal.  Eyes: Conjunctivae are normal. Right eye  exhibits no discharge. Left eye exhibits no discharge. No scleral icterus.  Neck: Neck supple. No tracheal deviation present.  Cardiovascular: Normal rate, regular rhythm and intact distal pulses.   Pulmonary/Chest: Effort normal and breath sounds normal. No stridor. No respiratory distress. She has no wheezes. She has no rales.  Abdominal: Soft. Bowel sounds are normal. She exhibits no distension. There is no tenderness. There is no rebound and no guarding.  Musculoskeletal: She exhibits no edema or tenderness.  Neurological: She is alert. She has normal strength. No cranial nerve deficit (no facial droop, extraocular movements intact, no slurred speech) or sensory deficit. She exhibits normal muscle tone. She displays no seizure activity. Coordination normal.  Skin: Skin is warm and dry. No rash noted.  Psychiatric: She has a normal mood and affect.  Nursing note and vitals reviewed.    ED Treatments / Results  DIAGNOSTIC STUDIES:  Oxygen Saturation is 97% on RA, normal by my interpretation.    COORDINATION OF  CARE:  11:52 PM Discussed treatment plan with pt at bedside which includes blood work and pt agreed to plan.  Labs (all labs ordered are listed, but only abnormal results are displayed) Labs Reviewed  BASIC METABOLIC PANEL - Abnormal; Notable for the following:       Result Value   Glucose, Bld 143 (*)    All other components within normal limits  CBC - Abnormal; Notable for the following:    Hemoglobin 11.6 (*)    HCT 33.8 (*)    All other components within normal limits  I-STAT TROPOININ, ED  I-STAT TROPOININ, ED  I-STAT TROPOININ, ED    EKG  EKG Interpretation  Date/Time:  Friday March 31 2016 23:37:00 EDT Ventricular Rate:  81 PR Interval:    QRS Duration: 154 QT Interval:  423 QTC Calculation: 491 R Axis:   -24 Text Interpretation:  Sinus rhythm Prolonged PR interval Right bundle branch block No significant change since last tracing Confirmed by KNAPP  MD-J, JON ([id number]) on 03/31/2016 11:44:42 PM       Radiology Dg Chest 2 View  Result Date: 04/01/2016 CLINICAL DATA:  Chest pain EXAM: CHEST  2 VIEW COMPARISON:  Chest radiograph 01/03/2016 FINDINGS: Unchanged median sternotomy wires, CABG markers and stent The heart size and mediastinal contours are within normal limits. Both lungs are clear. The visualized skeletal structures are unremarkable. IMPRESSION: No active cardiopulmonary disease. Electronically Signed   By: Ulyses Jarred M.D.   On: 04/01/2016 00:43    Procedures Procedures (including critical care time)  Medications Ordered in ED Medications - No data to display   Initial Impression / Assessment and Plan / ED Course  I have reviewed the triage vital signs and the nursing notes.  Pertinent labs & imaging results that were available during my care of the patient were reviewed by me and considered in my medical decision making (see chart for details).   Pt has known history of CAD.  Pt was in the hospital in December recently for NSTEMI.  She had a cath  at that time.  No significant worsening.  No interventions done at that time.  Pt presented to the ED this evening with chest pain.  Her symptoms resolved prior to arrival.  DIscussed options of overnight observation for serial troponins.  Pt would prefer to go home.  Serial troponin in the ed negative.  Pt has remained sx free.  Cannot exclude recurrent anginal sx but doubt she had an MI and she had  a  recent cath in December and the plan is for medical management. Offered admission again prior to discharge and patient would still prefer to go home.  Will have pt follow up with her cardiologist this week.  Return to the ED if she has any recurrent symptoms this weekend.  Final Clinical Impressions(s) / ED Diagnoses   Final diagnoses:  Chest pain, unspecified type    New Prescriptions New Prescriptions   No medications on file  I personally performed the services described in this documentation, which was scribed in my presence.  The recorded information has been reviewed and is accurate.       Dorie Rank, MD 04/01/16 [phone]

## 2016-04-01 ENCOUNTER — Emergency Department (HOSPITAL_COMMUNITY): Payer: Medicare Other

## 2016-04-01 LAB — BASIC METABOLIC PANEL
Anion gap: 11 (ref 5–15)
BUN: 14 mg/dL (ref 6–20)
CO2: 23 mmol/L (ref 22–32)
Calcium: 9.2 mg/dL (ref 8.9–10.3)
Chloride: 103 mmol/L (ref 101–111)
Creatinine, Ser: 0.85 mg/dL (ref 0.44–1.00)
GFR calc Af Amer: >60 mL/min (ref >60)
GFR calc non Af Amer: >60 mL/min (ref >60)
Glucose, Bld: 143 mg/dL — ABNORMAL HIGH (ref 65–99)
Potassium: 3.8 mmol/L (ref 3.5–5.1)
Sodium: 137 mmol/L (ref 135–145)

## 2016-04-01 LAB — I-STAT TROPONIN, ED
Troponin i, poc: 0 ng/mL (ref 0.00–0.08)
Troponin i, poc: 0.02 ng/mL (ref 0.00–0.08)

## 2016-04-01 LAB — CBC
HCT: 33.8 % — ABNORMAL LOW (ref 36.0–46.0)
Hemoglobin: 11.6 g/dL — ABNORMAL LOW (ref 12.0–15.0)
MCH: 29.3 pg (ref 26.0–34.0)
MCHC: 34.3 g/dL (ref 30.0–36.0)
MCV: 85.4 fL (ref 78.0–100.0)
Platelets: 158 10*3/uL (ref 150–400)
RBC: 3.96 MIL/uL (ref 3.87–5.11)
RDW: 14 % (ref 11.5–15.5)
WBC: 4.9 10*3/uL (ref 4.0–10.5)

## 2016-04-01 NOTE — Discharge Instructions (Signed)
Continue your medications, follow up with Dr Wynonia Lawman this week, return to the ED if you have any recurrent episodes of pain

## 2016-07-17 ENCOUNTER — Ambulatory Visit
Admission: RE | Admit: 2016-07-17 | Discharge: 2016-07-17 | Disposition: A | Discharge status: Home / Self Care | Payer: Medicare Other | Source: Ambulatory Visit | Attending: Family Medicine | Admitting: Family Medicine

## 2016-07-17 ENCOUNTER — Other Ambulatory Visit: Payer: Self-pay | Admitting: Family Medicine

## 2016-07-17 DIAGNOSIS — R059 Cough, unspecified: Secondary | ICD-10-CM

## 2016-07-17 DIAGNOSIS — R05 Cough: Secondary | ICD-10-CM

## 2017-04-17 IMAGING — MR MR MRA HEAD W/O CM
9 of 12 series · 31 of 48 positions shown · non-contrast
Comparison: Prior MRI from 07/19/2014.

CLINICAL DATA: Initial evaluation for acute stuttering speech,
aphasia

EXAM:
MRI HEAD WITHOUT CONTRAST
MRA HEAD WITHOUT CONTRAST
TECHNIQUE: Multiplanar, multiecho pulse sequences of the brain and surrounding
structures were obtained without intravenous contrast. Angiographic
images of the head were obtained using MRA technique without
contrast.

[Series 3: T1 · sagittal · 5.0mm · 0.47mm/px · 1 of 23 slices shown]
[im 1/23]
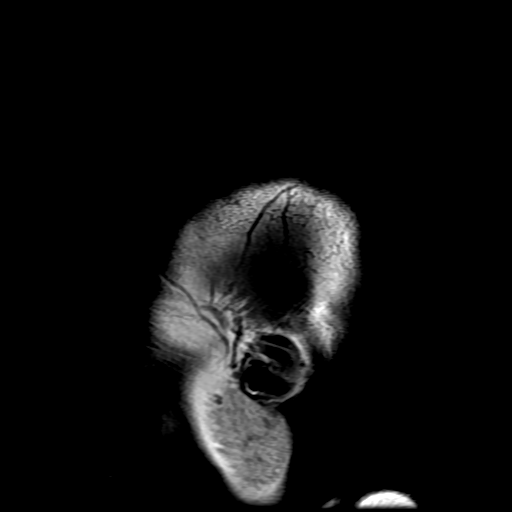

[Series 4: DWI · axial · 3.0mm · 1.09mm/px · z∈[-84,+51]mm · 7 of 92 slices shown (1 of 4)]
[im 1/92]
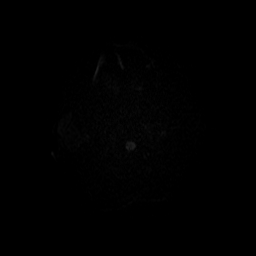
[im 16/92]
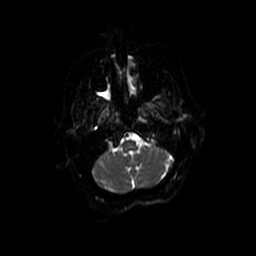
[im 31/92]
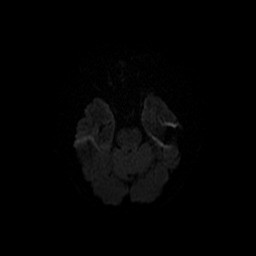
[im 46/92]
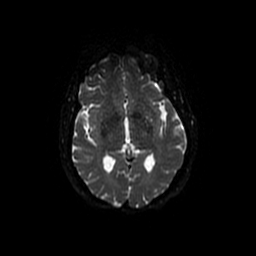
[im 61/92]
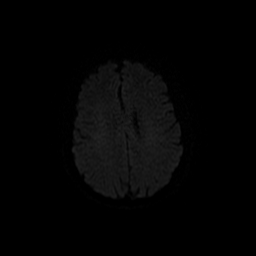
[im 76/92]
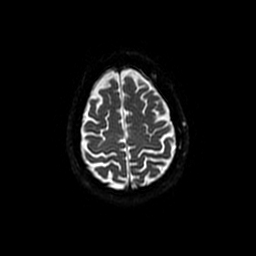
[im 92/92]
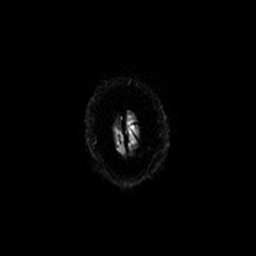

[Series 5: (id) mt fs · axial · 1.4mm · 0.43mm/px · z∈[-72,-16]mm · 5 of 136 slices shown]
[im 1/136]
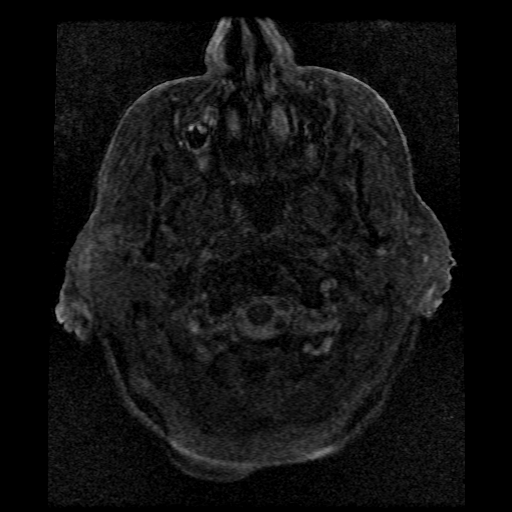
[im 28/136]
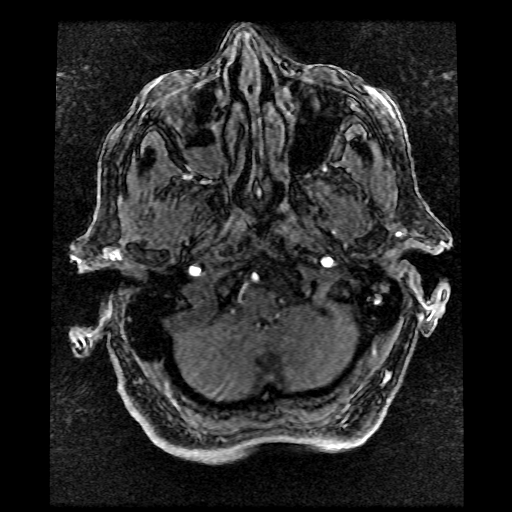
[im 41/136]
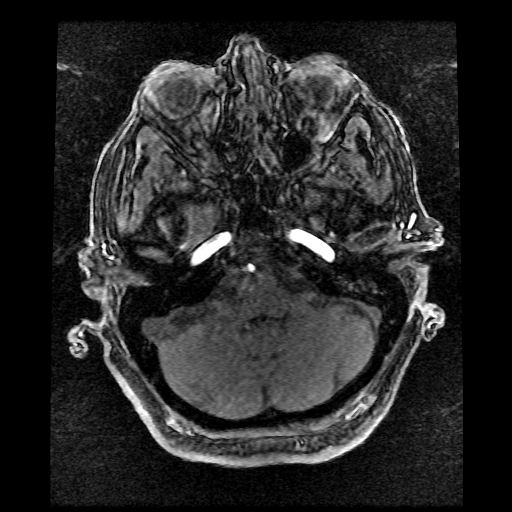
[im 55/136]
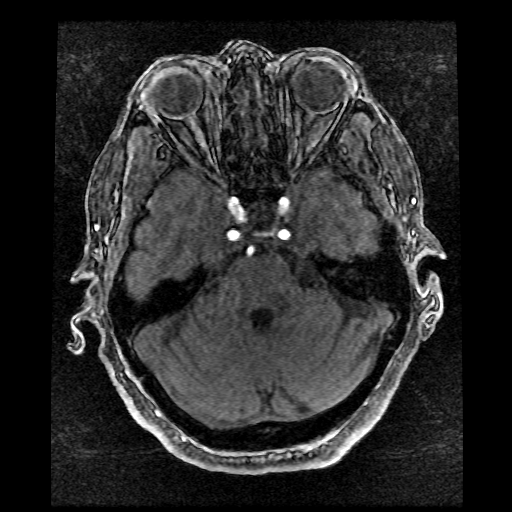
[im 82/136]
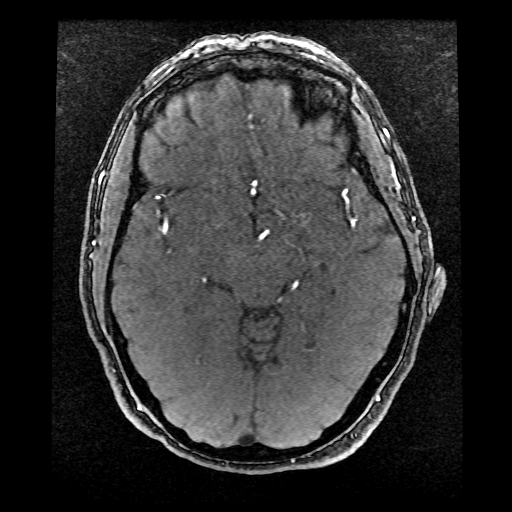

[Series 6: T2 · axial · 5.0mm · 0.43mm/px · z∈[-82,+50]mm · 2 of 23 slices shown (1 of 2)]
[im 1/23]
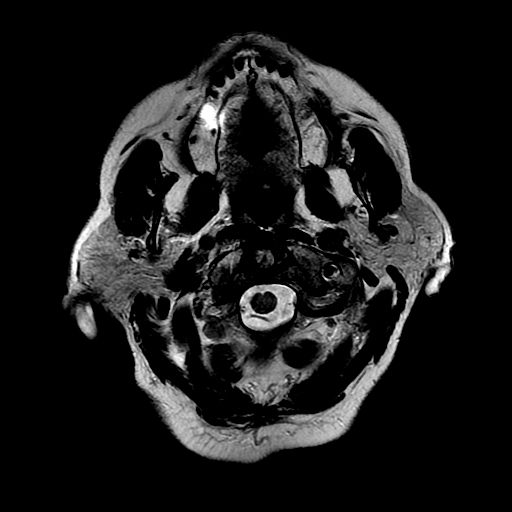
[im 23/23]
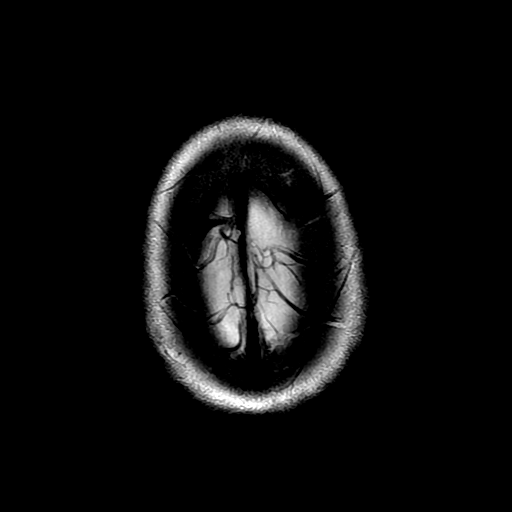

[Series 7: FLAIR · axial · 5.0mm · 0.43mm/px · z∈[-82,+50]mm · 2 of 23 slices shown]
[im 1/23]
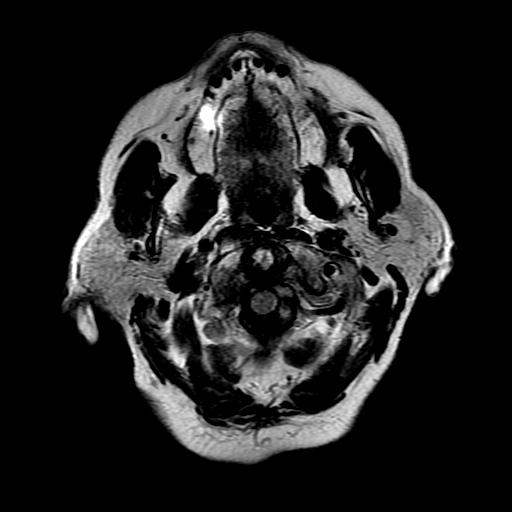
[im 23/23]
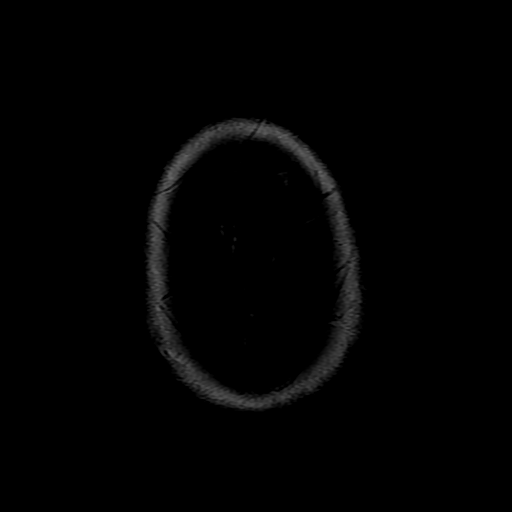

[Series 8: DWI · coronal · 5.0mm · 1.09mm/px · 5 of 66 slices shown (2 of 4)]
[im 1/66]
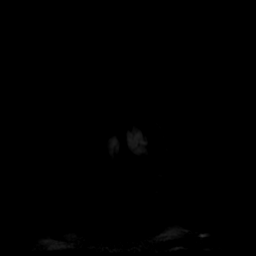
[im 17/66]
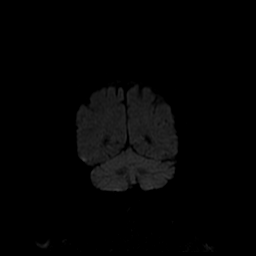
[im 33/66]
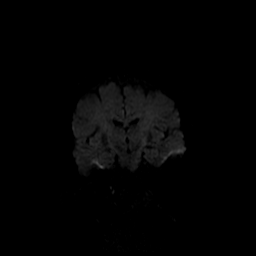
[im 49/66]
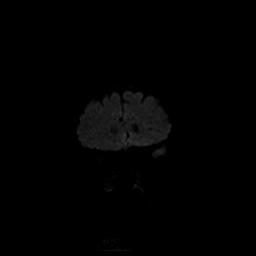
[im 66/66]
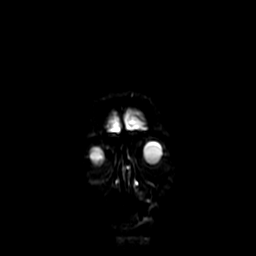

[Series 11: T2 · coronal · 5.0mm · 0.39mm/px · 2 of 25 slices shown (2 of 2)]
[im 1/25]
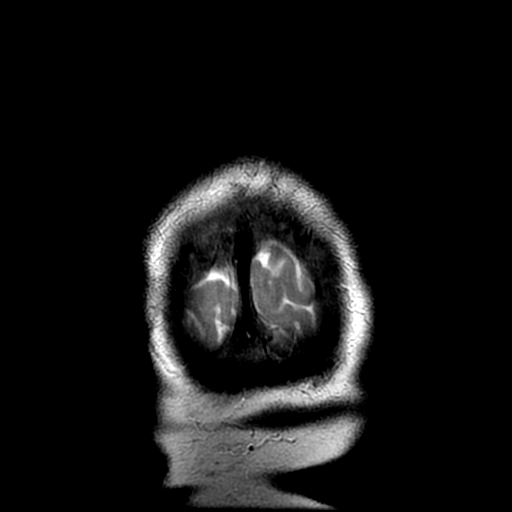
[im 25/25]
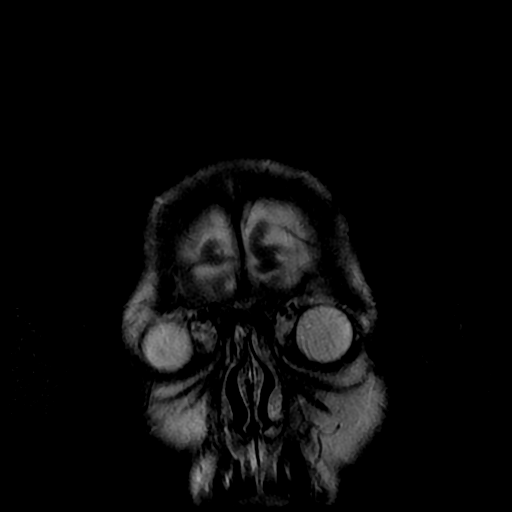

[Series 400: DWI · axial · 3.0mm · 1.09mm/px · z∈[-84,+51]mm · 4 of 46 slices shown (3 of 4)]
[im 1/46]
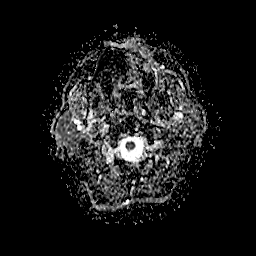
[im 16/46]
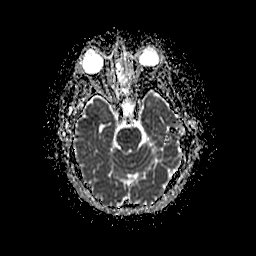
[im 31/46]
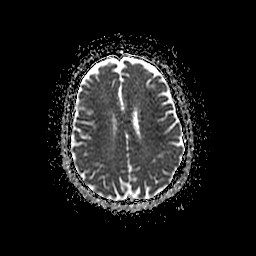
[im 46/46]
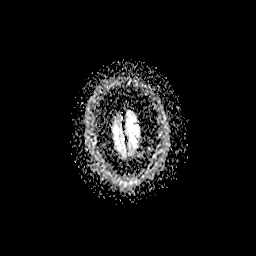

[Series 800: DWI · coronal · 5.0mm · 1.09mm/px · 3 of 33 slices shown (4 of 4)]
[im 1/33]
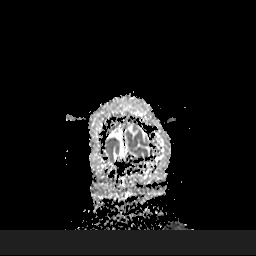
[im 17/33]
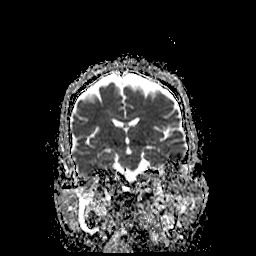
[im 33/33]
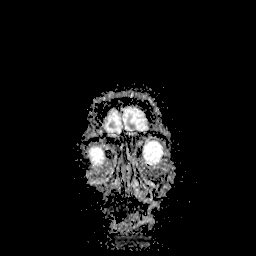

[31 of 48 positions shown; findings below may reference images not displayed]

FINDINGS: MRI HEAD FINDINGS

Brain: Cerebral volume within normal limits for age. Minimal patchy
T2/FLAIR hyperintensity within the periventricular and deep white
matter both cerebral hemispheres, most like related to chronic small
vessel ischemic disease, felt to be within normal limits for age.

No abnormal foci of restricted diffusion to suggest acute or
subacute ischemia. Gray-white matter differentiation is well
maintained. No evidence for acute or chronic intracranial
hemorrhage. No evidence for chronic infarction.

No mass lesion, midline shift, or mass effect. No hydrocephalus. No
extra-axial fluid collection. Major dural sinuses are patent.

Incidental note made of a partially empty sella. Midline structures
intact.

Vascular: Major intracranial vascular flow voids are maintained.

Skull and upper cervical spine: Craniocervical junction within
normal limits. Bone marrow signal intensity within normal limits. No
scalp soft tissue abnormality.

Sinuses/Orbits: Globes and orbital soft tissues within normal
limits. Patient is status post lens extraction bilaterally.

Scattered mucosal thickening throughout the paranasal sinuses. Fluid
level within the right maxillary sinus suggest acute sinusitis.
Small bilateral mastoid effusions, left greater than right. Inner
ear structures normal.

MRA HEAD FINDINGS

ANTERIOR CIRCULATION:

Visualized distal cervical segments of the internal carotid arteries
are widely patent with antegrade flow. Petrous, cavernous, and
supraclinoid segments widely patent without flow limiting stenosis.
A1 segments patent. Anterior communicating artery normal. Anterior
cerebral arteries widely patent. M1 segments patent without stenosis
or occlusion. MCA bifurcations normal. Distal MCA branches well
opacified and symmetric.

POSTERIOR CIRCULATION:

Vertebral arteries patent to the vertebrobasilar junction. Left
vertebral artery is dominant. Posterior inferior cerebellar arteries
are patent proximally. Basilar artery widely patent. Superior
cerebral arteries patent bilaterally. Both of the posterior cerebral
arteries are well opacified to their distal aspects. Small bilateral
posterior communicating arteries noted.

No aneurysm or vascular malformation.
IMPRESSION: MRI HEAD IMPRESSION:

1. Normal brain MRI for patient age.
2. Fluid level within the right maxillary sinus, suggestive of acute
sinusitis.

MRA HEAD IMPRESSION:

Normal intracranial MRA.

## 2017-10-22 ENCOUNTER — Other Ambulatory Visit: Payer: Self-pay | Admitting: Family Medicine

## 2017-10-22 ENCOUNTER — Ambulatory Visit
Admission: RE | Admit: 2017-10-22 | Discharge: 2017-10-22 | Disposition: A | Discharge status: Home / Self Care | Payer: Medicare Other | Source: Ambulatory Visit | Attending: Family Medicine | Admitting: Family Medicine

## 2017-10-22 DIAGNOSIS — M25552 Pain in left hip: Secondary | ICD-10-CM

## 2017-10-22 DIAGNOSIS — M810 Age-related osteoporosis without current pathological fracture: Secondary | ICD-10-CM

## 2017-12-31 ENCOUNTER — Other Ambulatory Visit: Payer: Self-pay | Admitting: Cardiology

## 2017-12-31 MED ORDER — AMLODIPINE BESYLATE 2.5 MG PO TABS: 2.5000 mg | ORAL_TABLET | Freq: Every day | ORAL | 3 refills | Status: DC

## 2017-12-31 NOTE — Telephone Encounter (Signed)
° ° °  1. Which medications need to be refilled? (please list name of each medication and dose if known) Amlodipine 2.5 mg tablets 1QD  2. Which pharmacy/location (including street and city if local pharmacy) is medication to be sent to?[place], [place]  3. Do they need a 30 day or 90 day supply? [place]

## 2017-12-31 NOTE — Telephone Encounter (Signed)
Rx sent to pharmacy as requested for amlodipine 2.5 mg one tablet daily #30 with 3 refills sent to [facility] in [facility].  Patient is due to follow up in Feb 2020.

## 2018-04-11 ENCOUNTER — Ambulatory Visit: Payer: Medicare Other | Admitting: Cardiology

## 2018-04-30 ENCOUNTER — Other Ambulatory Visit: Payer: Self-pay | Admitting: Cardiology

## 2018-05-06 ENCOUNTER — Encounter: Payer: Self-pay | Admitting: Cardiology

## 2018-05-31 ENCOUNTER — Other Ambulatory Visit: Payer: Self-pay | Admitting: Cardiology

## 2019-02-21 ENCOUNTER — Encounter: Payer: Self-pay | Admitting: Cardiology

## 2019-03-07 NOTE — Progress Notes (Addendum)
Cardiology Office Note:    Date:  03/10/2019   ID:  Heather [name], DOB [birth year], MRN [id number]  PCP:  Heather Downing, MD  Cardiologist:  Heather More, MD    Referring MD: Heather [name], *    ASSESSMENT:    1. Coronary artery disease of bypass graft of native heart with stable angina pectoris ([name])   2. H/O mitral valve repair   3. Hypertensive heart disease with chronic diastolic congestive heart failure ([name])   4. Mixed hyperlipidemia   5. Acquired hypothyroidism    PLAN:    In order of problems listed above:  1. Stable CAD she has had no anginal discomfort since her last interaction we will continue long-term dual antiplatelet therapy calcium channel blocker continue on statin lipid-lowering with Zetia. 2. Stable hypertension BP at target 3. Poorly controlled hyperlipidemia initiate Zetia 4. Continue her current thyroid supplement await TSH T4   Next appointment: 1 year   Medication Adjustments/Labs and Tests Ordered: Current medicines are reviewed at length with the patient today.  Concerns regarding medicines are outlined above.  Orders Placed This Encounter  Procedures  . EKG 12-Lead   No orders of the defined types were placed in this encounter.   Chief Complaint  Patient presents with  . Follow-up  . Coronary Artery Disease    History of Present Illness:    Heather [name] is a 86 y.o. female with a hx of coronary artery disease previous CABG and mitral valve repair 2003 inferior ST segment elevation MI in 2015 with PCI of the saphenous vein graft to the posterior lateral artery paroxysmal atrial fibrillation and heart failure with preserved ejection fraction.  Her coronary angiogram was December 2017 at that time she had no further PCI or stenting performed.  She was last seen time by Heather [name] in 2017. Compliance with diet, lifestyle and medications: Yes  She is seen today to establish cardiology care.  She tells me she is  absolutely statin intolerant we discussed PCSK9 inhibitor she declined but did agree to try Zetia 10 mg daily and has labs followed by her PCP office requested the last lipid profile and CMP from October 2020.  She has had some mild vague nonexertional chest pain but no real angina has not needed nitroglycerin no shortness of breath palpitation or syncope.  Patient has decided not to have COVID-19 immunization and is practicing isolation.  I independently reviewed the echocardiogram in 2015 ejection fraction 55 to 60% with inferior hypokinesia and mild concentric LVH.  The aortic valve was calcified but not restricted and there was no stenosis.  The mitral valve showed good function after repair with no stenosis no regurgitation mean gradient 5 mmHg.  No tricuspid regurgitation.  The left atrium was moderately dilated.  Coronary Diagrams Reviewed the coronary angiogram the native right is occluded and is supplied by the vein graft with 2 stents that are patent the LAD has severe stenosis with patent left thoracic artery and the native left circumflex is occluded with occlusion of the vein graft.  She had no further PCI and stent at that time  Diagnostic Dominance: Right   Past Medical History:  Diagnosis Date  . Arthritis   . CAD (coronary artery disease), native coronary artery 07/14/2013   Catheterization August/2003 showing severe three-vessel disease and moderate mitral regurgitation 08/28/2001 CABG with internal mammary graft to LAD, vein graft to circumflex, and vein graft to posterolateral branch and mitral valve repair by Heather [name]  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 Patent internal mammary graft with competitive flow in the distal vessel, occluded right coronary artery, occluded circumflex, patent saphenous vein graft to circumflex with mild/moderate disease proximally, minimal LV damage   . Chronic  diastolic CHF (congestive heart failure) (HCC)    a. EF 60% by LV gram 01/2009.  . Coronary artery disease    a. s/p MI in 1980;  b. 2003 CABG x 3 (LIMA->LAD, VG->OM, VG->RPL);  c. 01/2009 Cath: 3/3 patent grafts, native 3VD, EF 60%.  Marland Kitchen GERD (gastroesophageal reflux disease)   . H/O mitral valve repair    a. 2003  . Hyperlipidemia 07/14/2013   Intolerance to multiple statins including Crestor, pravastatin, and Lipitor   . Hypertension   . Hypothyroidism   . NSTEMI (non-ST elevated myocardial infarction) ([place])   . PAF (paroxysmal atrial fibrillation) ([place])   . S/P partial hysterectomy 1977    Past Surgical History:  Procedure Laterality Date  . ABDOMINAL HYSTERECTOMY  1977  . APPENDECTOMY  1950  . BLADDER SUSPENSION     tac  . CARDIAC CATHETERIZATION  03,05,11  . CARDIAC CATHETERIZATION N/A 04/30/2015   Procedure: Left Heart Cath and Cors/Grafts Angiography;  Surgeon: Heather Crome, MD;  Location: [facility];  Service: Cardiovascular;  Laterality: N/A;  . CARDIAC CATHETERIZATION N/A 04/30/2015   Procedure: Coronary Stent Intervention;  Surgeon: Heather Crome, MD;  Location: [facility];  Service: Cardiovascular;  Laterality: N/A;  Proximal and Distal SVG to the PLA  . CARDIAC CATHETERIZATION N/A 01/06/2016   Procedure: Left Heart Cath and Cors/Grafts Angiography;  Surgeon: Heather Booze, MD;  Location: [facility];  Service: Cardiovascular;  Laterality: N/A;  . CARPAL TUNNEL RELEASE     right and left  . CHOLECYSTECTOMY    . CORONARY ARTERY BYPASS GRAFT  2003  . KNEE ARTHROSCOPY     left  . LEFT HEART CATHETERIZATION WITH CORONARY ANGIOGRAM N/A 07/12/2013   Procedure: LEFT HEART CATHETERIZATION WITH CORONARY ANGIOGRAM;  Surgeon: Heather Booze, MD;  Location: [facility];  Service: Cardiovascular;  Laterality: N/A;  . MITRAL VALVE REPAIR  2003   with cabg  . THYROIDECTOMY  1968  . THYROIDECTOMY, PARTIAL     left    Current Medications: Current  Meds  Medication Sig  . acetaminophen (TYLENOL) 500 MG tablet Take 500 mg by mouth every 6 (six) hours as needed for moderate pain.  Marland Kitchen amLODipine (NORVASC) 2.5 MG tablet TAKE 1 TABLET BY MOUTH ONCE DAILY NEEDS  APPOINTMENT  WITH  CARDIOLOGIST  . aspirin EC 81 MG tablet Take 81 mg by mouth daily.  . clopidogrel (PLAVIX) 75 MG tablet Take 1 tablet (75 mg total) by mouth daily.  . furosemide (LASIX) 20 MG tablet Take 20 mg by mouth daily.  Marland Kitchen levothyroxine (SYNTHROID, LEVOTHROID) 50 MCG tablet Take 50 mcg by mouth daily.  . magnesium oxide (MAG-OX) 400 MG tablet Take 250 mg by mouth daily.   . nitroGLYCERIN (NITROSTAT) 0.4 MG SL tablet Place 0.4 mg under the tongue every 5 (five) minutes as needed for chest pain.     Allergies:   Atorvastatin, Codeine, Crestor [rosuvastatin], Pravastatin, Simvastatin, and Ticagrelor   Social History   Socioeconomic History  . Marital status: Widowed    Spouse name: Not on file  . Number of children: Not on file  . Years of education:  Not on file  . Highest education level: Not on file  Occupational History  . Not on file  Tobacco Use  . Smoking status: Former Smoker    Quit date: 06/06/1967    Years since quitting: 51.7  . Smokeless tobacco: Former Network engineer and Sexual Activity  . Alcohol use: No  . Drug use: No  . Sexual activity: Not Currently  Other Topics Concern  . Not on file  Social History Narrative   Lives in [place].  Widowed.   Social Determinants of Health   Financial Resource Strain:   . Difficulty of Paying Living Expenses: Not on file  Food Insecurity:   . Worried About Charity fundraiser in the Last Year: Not on file  . Ran Out of Food in the Last Year: Not on file  Transportation Needs:   . [name] of Transportation (Medical): Not on file  . [name] of Transportation (Non-Medical): Not on file  Physical Activity:   . Days of Exercise per Week: Not on file  . Minutes of Exercise per Session: Not on file  Stress:   .  Feeling of Stress : Not on file  Social Connections:   . Frequency of Communication with Friends and Family: Not on file  . Frequency of Social Gatherings with Friends and Family: Not on file  . Attends Religious Services: Not on file  . Active Member of Clubs or Organizations: Not on file  . Attends Archivist Meetings: Not on file  . Marital Status: Not on file     Family History: The patient's family history includes Dementia in her mother and sister; Heart Problems in her father, paternal grandfather, and sister; Other in an other family member; Tuberculosis in her maternal grandmother. ROS:   Please see the history of present illness.    All other systems reviewed and are negative.  EKGs/Labs/Other Studies Reviewed:    The following studies were reviewed today:  EKG:  EKG ordered today and personally reviewed.  The ekg ordered today demonstrates sinus rhythm right bundle branch block  Recent Labs: 10/14/2018) CBC normal hemoglobin 12.6 CMP shows normal renal function GFR 61 cc creatinine 0.6 calcium 4.4 Cholesterol 249 HDL 39 LDL 181.  She has severe upper lipidemia Recent Lipid Panel    Component Value Date/Time   CHOL 180 04/30/2015 0548   TRIG 249 (H) 04/30/2015 0548   HDL 27 (L) 04/30/2015 0548   CHOLHDL 6.7 04/30/2015 0548   VLDL 50 (H) 04/30/2015 0548   LDLCALC 103 (H) 04/30/2015 0548    Physical Exam:    VS:  BP 136/82   Pulse 97   Temp (!) 97.3 F (36.3 C)   Ht 4\' 11"  (1.499 m)   Wt 175 lb (79.4 kg)   SpO2 98%   BMI 35.35 kg/m     Wt Readings from Last 3 Encounters:  03/10/19 175 lb (79.4 kg)  01/06/16 181 lb 14.1 oz (82.5 kg)  04/30/15 181 lb 14.1 oz (82.5 kg)     GEN:  Well nourished, well developed in no acute distress HEENT: Normal NECK: No JVD; No carotid bruits LYMPHATICS: No lymphadenopathy CARDIAC: RRR, no murmurs, rubs, gallops RESPIRATORY:  Clear to auscultation without rales, wheezing or rhonchi  ABDOMEN: Soft, non-tender,  non-distended MUSCULOSKELETAL:  No edema; No deformity  SKIN: Warm and dry NEUROLOGIC:  Alert and oriented x 3 PSYCHIATRIC:  Normal affect    Signed, Heather More, MD  03/10/2019 2:05 PM  [facility]

## 2019-03-10 ENCOUNTER — Other Ambulatory Visit: Payer: Self-pay

## 2019-03-10 ENCOUNTER — Encounter: Payer: Self-pay | Admitting: Cardiology

## 2019-03-10 ENCOUNTER — Ambulatory Visit (INDEPENDENT_AMBULATORY_CARE_PROVIDER_SITE_OTHER): Payer: Medicare Other | Admitting: Cardiology

## 2019-03-10 VITALS — BP 136/82 | HR 97 | Temp 97.3°F | Ht 59.0 in | Wt 175.0 lb

## 2019-03-10 DIAGNOSIS — I25708 Atherosclerosis of coronary artery bypass graft(s), unspecified, with other forms of angina pectoris: Secondary | ICD-10-CM

## 2019-03-10 DIAGNOSIS — Z9889 Other specified postprocedural states: Secondary | ICD-10-CM

## 2019-03-10 DIAGNOSIS — E039 Hypothyroidism, unspecified: Secondary | ICD-10-CM

## 2019-03-10 DIAGNOSIS — E782 Mixed hyperlipidemia: Secondary | ICD-10-CM | POA: Diagnosis not present

## 2019-03-10 DIAGNOSIS — I110 Hypertensive heart disease with heart failure: Secondary | ICD-10-CM | POA: Diagnosis not present

## 2019-03-10 DIAGNOSIS — I5032 Chronic diastolic (congestive) heart failure: Secondary | ICD-10-CM

## 2019-03-10 MED ORDER — EZETIMIBE 10 MG PO TABS: 10.0000 mg | ORAL_TABLET | Freq: Every day | ORAL | 3 refills | Status: DC

## 2019-03-10 NOTE — Patient Instructions (Addendum)
Medication Instructions:  Your physician has recommended you make the following change in your medication:  1.  START Zetia 10 mg taking 1 tablet daily   *If you need a refill on your cardiac medications before your next appointment, please call your pharmacy*  Lab Work: None ordered  If you have labs (blood work) drawn today and your tests are completely normal, you will receive your results only by: Marland Kitchen MyChart Message (if you have MyChart) OR . A paper copy in the mail If you have any lab test that is abnormal or we need to change your treatment, we will call you to review the results.  Testing/Procedures: None ordered  Follow-Up: At [facility], you and your health needs are our priority.  As part of our continuing mission to provide you with exceptional heart care, we have created designated Provider Care Teams.  These Care Teams include your primary Cardiologist (physician) and Advanced Practice Providers (APPs -  Physician Assistants and Nurse Practitioners) who all work together to provide you with the care you need, when you need it.  Your next appointment:   12 month(s)  The format for your next appointment:   In Person  Provider:   Shirlee More, MD  Other Instructions

## 2019-08-20 ENCOUNTER — Emergency Department (HOSPITAL_COMMUNITY): Payer: Medicare Other

## 2019-08-20 ENCOUNTER — Encounter (HOSPITAL_COMMUNITY): Payer: Self-pay

## 2019-08-20 ENCOUNTER — Inpatient Hospital Stay (HOSPITAL_COMMUNITY)
Admission: EM | Admit: 2019-08-20 | Discharge: 2019-08-24 | DRG: 243 | Disposition: A | Discharge status: Home / Self Care | Payer: Medicare Other | Attending: Internal Medicine | Admitting: Internal Medicine

## 2019-08-20 ENCOUNTER — Other Ambulatory Visit: Payer: Self-pay

## 2019-08-20 DIAGNOSIS — I5032 Chronic diastolic (congestive) heart failure: Secondary | ICD-10-CM | POA: Diagnosis present

## 2019-08-20 DIAGNOSIS — Z8679 Personal history of other diseases of the circulatory system: Secondary | ICD-10-CM

## 2019-08-20 DIAGNOSIS — K219 Gastro-esophageal reflux disease without esophagitis: Secondary | ICD-10-CM | POA: Diagnosis present

## 2019-08-20 DIAGNOSIS — Z888 Allergy status to other drugs, medicaments and biological substances status: Secondary | ICD-10-CM

## 2019-08-20 DIAGNOSIS — E785 Hyperlipidemia, unspecified: Secondary | ICD-10-CM | POA: Diagnosis present

## 2019-08-20 DIAGNOSIS — I25709 Atherosclerosis of coronary artery bypass graft(s), unspecified, with unspecified angina pectoris: Secondary | ICD-10-CM

## 2019-08-20 DIAGNOSIS — I2582 Chronic total occlusion of coronary artery: Secondary | ICD-10-CM | POA: Diagnosis present

## 2019-08-20 DIAGNOSIS — E039 Hypothyroidism, unspecified: Secondary | ICD-10-CM | POA: Diagnosis present

## 2019-08-20 DIAGNOSIS — I2510 Atherosclerotic heart disease of native coronary artery without angina pectoris: Secondary | ICD-10-CM | POA: Diagnosis present

## 2019-08-20 DIAGNOSIS — R079 Chest pain, unspecified: Secondary | ICD-10-CM | POA: Diagnosis not present

## 2019-08-20 DIAGNOSIS — I480 Paroxysmal atrial fibrillation: Secondary | ICD-10-CM | POA: Diagnosis present

## 2019-08-20 DIAGNOSIS — Z87891 Personal history of nicotine dependence: Secondary | ICD-10-CM

## 2019-08-20 DIAGNOSIS — E782 Mixed hyperlipidemia: Secondary | ICD-10-CM | POA: Diagnosis present

## 2019-08-20 DIAGNOSIS — Z7982 Long term (current) use of aspirin: Secondary | ICD-10-CM

## 2019-08-20 DIAGNOSIS — Z955 Presence of coronary angioplasty implant and graft: Secondary | ICD-10-CM

## 2019-08-20 DIAGNOSIS — Z79890 Hormone replacement therapy (postmenopausal): Secondary | ICD-10-CM

## 2019-08-20 DIAGNOSIS — Z20822 Contact with and (suspected) exposure to covid-19: Secondary | ICD-10-CM | POA: Diagnosis present

## 2019-08-20 DIAGNOSIS — I25810 Atherosclerosis of coronary artery bypass graft(s) without angina pectoris: Secondary | ICD-10-CM | POA: Diagnosis present

## 2019-08-20 DIAGNOSIS — Z950 Presence of cardiac pacemaker: Secondary | ICD-10-CM

## 2019-08-20 DIAGNOSIS — Z66 Do not resuscitate: Secondary | ICD-10-CM | POA: Diagnosis present

## 2019-08-20 DIAGNOSIS — I495 Sick sinus syndrome: Secondary | ICD-10-CM | POA: Diagnosis not present

## 2019-08-20 DIAGNOSIS — Z79899 Other long term (current) drug therapy: Secondary | ICD-10-CM

## 2019-08-20 DIAGNOSIS — E890 Postprocedural hypothyroidism: Secondary | ICD-10-CM | POA: Diagnosis present

## 2019-08-20 DIAGNOSIS — I4510 Unspecified right bundle-branch block: Secondary | ICD-10-CM | POA: Diagnosis present

## 2019-08-20 DIAGNOSIS — I700 Atherosclerosis of aorta: Secondary | ICD-10-CM | POA: Diagnosis present

## 2019-08-20 DIAGNOSIS — R52 Pain, unspecified: Secondary | ICD-10-CM

## 2019-08-20 DIAGNOSIS — Z7902 Long term (current) use of antithrombotics/antiplatelets: Secondary | ICD-10-CM

## 2019-08-20 DIAGNOSIS — Z90711 Acquired absence of uterus with remaining cervical stump: Secondary | ICD-10-CM

## 2019-08-20 DIAGNOSIS — I110 Hypertensive heart disease with heart failure: Secondary | ICD-10-CM | POA: Diagnosis present

## 2019-08-20 DIAGNOSIS — M25559 Pain in unspecified hip: Secondary | ICD-10-CM | POA: Diagnosis present

## 2019-08-20 DIAGNOSIS — I440 Atrioventricular block, first degree: Secondary | ICD-10-CM | POA: Diagnosis present

## 2019-08-20 DIAGNOSIS — I252 Old myocardial infarction: Secondary | ICD-10-CM

## 2019-08-20 DIAGNOSIS — M1990 Unspecified osteoarthritis, unspecified site: Secondary | ICD-10-CM | POA: Diagnosis present

## 2019-08-20 HISTORY — DX: Chest pain, unspecified: R07.9

## 2019-08-20 LAB — APTT: aPTT: 37 seconds — ABNORMAL HIGH (ref 24–36)

## 2019-08-20 LAB — TROPONIN I (HIGH SENSITIVITY)
Troponin I (High Sensitivity): 9 ng/L (ref <18)
Troponin I (High Sensitivity): 10 ng/L (ref <18)
Troponin I (High Sensitivity): 8 ng/L (ref <18)

## 2019-08-20 LAB — BASIC METABOLIC PANEL
Anion gap: 9 (ref 5–15)
BUN: 10 mg/dL (ref 8–23)
CO2: 24 mmol/L (ref 22–32)
Calcium: 9.6 mg/dL (ref 8.9–10.3)
Chloride: 104 mmol/L (ref 98–111)
Creatinine, Ser: 0.83 mg/dL (ref 0.44–1.00)
GFR calc Af Amer: >60 mL/min (ref >60)
GFR calc non Af Amer: >60 mL/min (ref >60)
Glucose, Bld: 95 mg/dL (ref 70–99)
Potassium: 4 mmol/L (ref 3.5–5.1)
Sodium: 137 mmol/L (ref 135–145)

## 2019-08-20 LAB — PROTIME-INR
INR: 1.1 (ref 0.8–1.2)
Prothrombin Time: 13.7 seconds (ref 11.4–15.2)

## 2019-08-20 LAB — CBC WITH DIFFERENTIAL/PLATELET
Abs Immature Granulocytes: 0.03 10*3/uL (ref 0.00–0.07)
Basophils Absolute: 0 10*3/uL (ref 0.0–0.1)
Basophils Relative: 1 %
Eosinophils Absolute: 0.1 10*3/uL (ref 0.0–0.5)
Eosinophils Relative: 2 %
HCT: 34.2 % — ABNORMAL LOW (ref 36.0–46.0)
Hemoglobin: 11.3 g/dL — ABNORMAL LOW (ref 12.0–15.0)
Immature Granulocytes: 1 %
Lymphocytes Relative: 24 %
Lymphs Abs: 1.4 10*3/uL (ref 0.7–4.0)
MCH: 29 pg (ref 26.0–34.0)
MCHC: 33 g/dL (ref 30.0–36.0)
MCV: 87.9 fL (ref 80.0–100.0)
Monocytes Absolute: 0.4 10*3/uL (ref 0.1–1.0)
Monocytes Relative: 7 %
Neutro Abs: 4 10*3/uL (ref 1.7–7.7)
Neutrophils Relative %: 65 %
Platelets: 207 10*3/uL (ref 150–400)
RBC: 3.89 MIL/uL (ref 3.87–5.11)
RDW: 13.6 % (ref 11.5–15.5)
WBC: 6 10*3/uL (ref 4.0–10.5)
nRBC: 0 % (ref 0.0–0.2)

## 2019-08-20 LAB — SARS CORONAVIRUS 2 BY RT PCR (HOSPITAL ORDER, PERFORMED IN ~~LOC~~ HOSPITAL LAB): SARS Coronavirus 2: NEGATIVE

## 2019-08-20 LAB — BRAIN NATRIURETIC PEPTIDE: B Natriuretic Peptide: 146.8 pg/mL — ABNORMAL HIGH (ref 0.0–100.0)

## 2019-08-20 MED ORDER — CLOPIDOGREL BISULFATE 75 MG PO TABS
75.0000 mg | ORAL_TABLET | Freq: Every day | ORAL | Status: DC
  Administered 2019-08-21: 75 mg via ORAL
  Filled 2019-08-20: qty 1

## 2019-08-20 MED ORDER — ENOXAPARIN SODIUM 40 MG/0.4ML ~~LOC~~ SOLN
40.0000 mg | SUBCUTANEOUS | Status: DC
  Administered 2019-08-20: 40 mg via SUBCUTANEOUS
  Filled 2019-08-20: qty 0.4

## 2019-08-20 MED ORDER — VITAMIN D 25 MCG (1000 UNIT) PO TABS
1000.0000 [IU] | ORAL_TABLET | Freq: Every day | ORAL | Status: DC
  Administered 2019-08-21 – 2019-08-24 (×4): 1000 [IU] via ORAL
  Filled 2019-08-20 (×4): qty 1

## 2019-08-20 MED ORDER — FAMOTIDINE 20 MG PO TABS: 10.0000 mg | ORAL_TABLET | Freq: Every day | ORAL | Status: DC | PRN

## 2019-08-20 MED ORDER — ACETAMINOPHEN 325 MG PO TABS
650.0000 mg | ORAL_TABLET | ORAL | Status: DC | PRN
  Administered 2019-08-21 – 2019-08-22 (×2): 650 mg via ORAL
  Filled 2019-08-20 (×2): qty 2

## 2019-08-20 MED ORDER — LEVOTHYROXINE SODIUM 50 MCG PO TABS
50.0000 ug | ORAL_TABLET | Freq: Every day | ORAL | Status: DC
  Administered 2019-08-21 – 2019-08-24 (×4): 50 ug via ORAL
  Filled 2019-08-20 (×4): qty 1

## 2019-08-20 MED ORDER — EZETIMIBE 10 MG PO TABS
10.0000 mg | ORAL_TABLET | Freq: Every day | ORAL | Status: DC
  Administered 2019-08-21 – 2019-08-24 (×4): 10 mg via ORAL
  Filled 2019-08-20 (×4): qty 1

## 2019-08-20 MED ORDER — NITROGLYCERIN 0.4 MG SL SUBL: 0.4000 mg | SUBLINGUAL_TABLET | SUBLINGUAL | Status: DC | PRN

## 2019-08-20 MED ORDER — ASPIRIN EC 81 MG PO TBEC
81.0000 mg | DELAYED_RELEASE_TABLET | Freq: Every day | ORAL | Status: DC
  Administered 2019-08-21: 81 mg via ORAL
  Filled 2019-08-20: qty 1

## 2019-08-20 MED ORDER — AMLODIPINE BESYLATE 2.5 MG PO TABS
2.5000 mg | ORAL_TABLET | Freq: Every day | ORAL | Status: DC
  Administered 2019-08-21 – 2019-08-24 (×4): 2.5 mg via ORAL
  Filled 2019-08-20 (×4): qty 1

## 2019-08-20 NOTE — ED Triage Notes (Signed)
Pt [facility] C/O CP that radiates to her shoulder blades. Pt states this pain has been going on for 2 weeks now with increasing pain over the last 3 days. Pt is A&O. Pt rates pain 9/10. EMS gave 324 of ASA and 1 Nitro prior to arrival. Pt states the nitro helped some. EMS also stated pt was not orthostatic but however did become dizzy and went into A-fib while standing. Pt does not have a hx of A-fib but has had 3 Mis in the past.

## 2019-08-20 NOTE — H&P (Signed)
History and Physical    Heather [name] ZDG:[id number] DOB: 1930-04-19 DOA: 08/20/2019  PCP: Leonard Downing, MD Patient coming from: Home  Chief Complaint: Chest pain  HPI: Heather [name] is a 86 y.o. female with medical history significant of multivessel CAD/multiple MIs status post CABG and PCI, history of mitral valve repair, chronic diastolic CHF, GERD, hypertension, hyperlipidemia with history of statin intolerance, hypothyroidism, paroxysmal atrial fibrillation not on anticoagulation presenting to the ED via EMS with complaints of chest pain.  She received aspirin 324 mg and 1 nitro prior to ED arrival.  EMS reported that patient was not orthostatic but became dizzy and went into A. fib while standing.  Patient reports having exertional chest tightness and associated pain between her shoulder blades for the past few weeks.  The same thing happened today and she felt dizzy.  States symptoms resolved after EMS gave her nitroglycerin.  Denies associated diaphoresis or nausea.  Patient has no other complaints.  Denies fevers, vomiting, abdominal pain, or diarrhea.  ED Course: Hemodynamically stable.  Labs showing no leukocytosis.  Hemoglobin 11.3, at baseline.  INR 1.1.  High-sensitivity troponin x2 negative.  EKG without acute ischemic changes.  BNP 146.  SARS-CoV-2 PCR test negative.  Chest x-ray showing cardiomegaly, aortic atherosclerosis, and no active disease.  ED provider discussed the case with Dr. Claiborne Billings from cardiology who recommended admission for observation.  Review of Systems:  All systems reviewed and apart from history of presenting illness, are negative.  Past Medical History:  Diagnosis Date  . Arthritis   . CAD (coronary artery disease), native coronary artery 07/14/2013   Catheterization August/2003 showing severe three-vessel disease and moderate mitral regurgitation 08/28/2001 CABG with internal mammary graft to LAD, vein graft to circumflex, and vein graft to  posterolateral branch and mitral valve repair by Dr. 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 Patent internal mammary graft with competitive flow in the distal vessel, occluded right coronary artery, occluded circumflex, patent saphenous vein graft to circumflex with mild/moderate disease proximally, minimal LV damage   . Chronic diastolic CHF (congestive heart failure) (HCC)    a. EF 60% by LV gram 01/2009.  . Coronary artery disease    a. s/p MI in 1980;  b. 2003 CABG x 3 (LIMA->LAD, VG->OM, VG->RPL);  c. 01/2009 Cath: 3/3 patent grafts, native 3VD, EF 60%.  Marland Kitchen GERD (gastroesophageal reflux disease)   . H/O mitral valve repair    a. 2003  . Hyperlipidemia 07/14/2013   Intolerance to multiple statins including Crestor, pravastatin, and Lipitor   . Hypertension   . Hypothyroidism   . NSTEMI (non-ST elevated myocardial infarction) ([place])   . PAF (paroxysmal atrial fibrillation) ([place])   . S/P partial hysterectomy 1977    Past Surgical History:  Procedure Laterality Date  . ABDOMINAL HYSTERECTOMY  1977  . APPENDECTOMY  1950  . BLADDER SUSPENSION     tac  . CARDIAC CATHETERIZATION  03,05,11  . CARDIAC CATHETERIZATION N/A 04/30/2015   Procedure: Left Heart Cath and Cors/Grafts Angiography;  Surgeon: Belva Crome, MD;  Location: [facility];  Service: Cardiovascular;  Laterality: N/A;  . CARDIAC CATHETERIZATION N/A 04/30/2015   Procedure: Coronary Stent Intervention;  Surgeon: Belva Crome, MD;  Location: [facility];  Service: Cardiovascular;  Laterality: N/A;  Proximal and Distal SVG to the PLA  . CARDIAC CATHETERIZATION N/A 01/06/2016   Procedure: Left  Heart Cath and Cors/Grafts Angiography;  Surgeon: Jettie Booze, MD;  Location: [facility];  Service: Cardiovascular;  Laterality: N/A;  . CARPAL TUNNEL RELEASE     right and left  . CHOLECYSTECTOMY    . CORONARY ARTERY  BYPASS GRAFT  2003  . KNEE ARTHROSCOPY     left  . LEFT HEART CATHETERIZATION WITH CORONARY ANGIOGRAM N/A 07/12/2013   Procedure: LEFT HEART CATHETERIZATION WITH CORONARY ANGIOGRAM;  Surgeon: Jettie Booze, MD;  Location: [facility];  Service: Cardiovascular;  Laterality: N/A;  . MITRAL VALVE REPAIR  2003   with cabg  . THYROIDECTOMY  1968  . THYROIDECTOMY, PARTIAL     left     reports that she quit smoking about 52 years ago. She has quit using smokeless tobacco. She reports that she does not drink alcohol and does not use drugs.  Allergies  Allergen Reactions  . Atorvastatin     Muscle ache  . Codeine Nausea And Vomiting  . Crestor [Rosuvastatin]     Muscle ache  . Pravastatin     Muscle ache  . Simvastatin     Muscle cramps   . Ticagrelor     Dyspnea     Family History  Problem Relation Age of Onset  . Dementia Mother   . Heart Problems Father   . Dementia Sister   . Heart Problems Sister   . Other Other        no premature CAD.  . Tuberculosis Maternal Grandmother   . Heart Problems Paternal Grandfather     Prior to Admission medications   Medication Sig Start Date End Date Taking? Authorizing Provider  acetaminophen (TYLENOL) 500 MG tablet Take 500 mg by mouth every 6 (six) hours as needed for mild pain or moderate pain.    Yes [provider]  amLODipine (NORVASC) 2.5 MG tablet TAKE 1 TABLET BY MOUTH ONCE DAILY NEEDS  APPOINTMENT  WITH  CARDIOLOGIST Patient taking differently: Take 2.5 mg by mouth daily.  06/03/18  Yes Buford Dresser, MD  aspirin EC 81 MG tablet Take 81 mg by mouth daily.   Yes [provider]  cholecalciferol (VITAMIN D3) 25 MCG (1000 UNIT) tablet Take 1,000 Units by mouth daily.   Yes [provider]  clopidogrel (PLAVIX) 75 MG tablet Take 1 tablet (75 mg total) by mouth daily. 07/17/13  Yes Jacolyn Reedy, MD  ezetimibe (ZETIA) 10 MG tablet Take 1 tablet (10 mg total) by mouth daily. 03/10/19 08/20/19  Yes Richardo Priest, MD  famotidine (PEPCID) 10 MG tablet Take 10 mg by mouth daily as needed for heartburn or indigestion.   Yes [provider]  furosemide (LASIX) 20 MG tablet Take 20 mg by mouth daily. 01/20/19  Yes [provider]  levothyroxine (SYNTHROID, LEVOTHROID) 50 MCG tablet Take 50 mcg by mouth daily.   Yes [provider]  Magnesium 250 MG TABS Take 1 tablet by mouth daily.   Yes [provider]  nitroGLYCERIN (NITROSTAT) 0.4 MG SL tablet Place 0.4 mg under the tongue every 5 (five) minutes as needed for chest pain.   Yes [provider]    Physical Exam: Vitals:   08/20/19 1830 08/20/19 1901 08/20/19 2115 08/20/19 2130  BP: (!) 138/59 140/64 (!) 112/47 125/62  Pulse: 80 83 75 76  Resp: 15 20 (!) 21 15  Temp:      TempSrc:      SpO2: 92% 95% 95% 97%  Weight:  Height:        Physical Exam Constitutional:      General: She is not in acute distress. HENT:     Head: Normocephalic and atraumatic.  Eyes:     Extraocular Movements: Extraocular movements intact.     Conjunctiva/sclera: Conjunctivae normal.  Cardiovascular:     Rate and Rhythm: Regular rhythm. Bradycardia present.     Pulses: Normal pulses.     Heart sounds: Normal heart sounds.  Pulmonary:     Effort: Pulmonary effort is normal. No respiratory distress.     Breath sounds: Normal breath sounds. No wheezing or rales.  Abdominal:     General: Bowel sounds are normal. There is no distension.     Palpations: Abdomen is soft.     Tenderness: There is no abdominal tenderness. There is no guarding.  Musculoskeletal:        General: No swelling or tenderness.     Cervical back: Normal range of motion and neck supple.  Skin:    General: Skin is warm and dry.  Neurological:     General: No focal deficit present.     Mental Status: She is alert and oriented to person, place, and time.     Labs on Admission: I have personally reviewed following labs and  imaging studies  CBC: Recent Labs  Lab 08/20/19 1720  WBC 6.0  NEUTROABS 4.0  HGB 11.3*  HCT 34.2*  MCV 87.9  PLT 347   Basic Metabolic Panel: Recent Labs  Lab 08/20/19 1720  NA 137  K 4.0  CL 104  CO2 24  GLUCOSE 95  BUN 10  CREATININE 0.83  CALCIUM 9.6   GFR: Estimated Creatinine Clearance: 44.2 mL/min (by C-G formula based on SCr of 0.83 mg/dL). Liver Function Tests: No results for input(s): AST, ALT, ALKPHOS, BILITOT, PROT, ALBUMIN in the last 168 hours. No results for input(s): LIPASE, AMYLASE in the last 168 hours. No results for input(s): AMMONIA in the last 168 hours. Coagulation Profile: Recent Labs  Lab 08/20/19 1720  INR 1.1   Cardiac Enzymes: No results for input(s): CKTOTAL, CKMB, CKMBINDEX, TROPONINI in the last 168 hours. BNP (last 3 results) No results for input(s): PROBNP in the last 8760 hours. HbA1C: No results for input(s): HGBA1C in the last 72 hours. CBG: No results for input(s): GLUCAP in the last 168 hours. Lipid Profile: No results for input(s): CHOL, HDL, LDLCALC, TRIG, CHOLHDL, LDLDIRECT in the last 72 hours. Thyroid Function Tests: No results for input(s): TSH, T4TOTAL, FREET4, T3FREE, THYROIDAB in the last 72 hours. Anemia Panel: No results for input(s): VITAMINB12, FOLATE, FERRITIN, TIBC, IRON, RETICCTPCT in the last 72 hours. Urine analysis:    Component Value Date/Time   COLORURINE YELLOW 07/19/2014 1844   APPEARANCEUR CLOUDY (A) 07/19/2014 1844   LABSPEC 1.014 07/19/2014 1844   PHURINE 5.5 07/19/2014 1844   GLUCOSEU NEGATIVE 07/19/2014 1844   HGBUR SMALL (A) 07/19/2014 1844   BILIRUBINUR NEGATIVE 07/19/2014 1844   KETONESUR NEGATIVE 07/19/2014 1844   PROTEINUR NEGATIVE 07/19/2014 1844   UROBILINOGEN 0.2 07/19/2014 1844   NITRITE NEGATIVE 07/19/2014 1844   LEUKOCYTESUR NEGATIVE 07/19/2014 1844    Radiological Exams on Admission: DG Chest Portable 1 View  Result Date: 08/20/2019 CLINICAL DATA:  Chest pain and  shortness of breath over the last 2 days. EXAM: PORTABLE CHEST 1 VIEW COMPARISON:  07/17/2016 FINDINGS: Previous median sternotomy and CABG. Chronic cardiomegaly and aortic atherosclerosis. The lungs are clear. No infiltrate, edema, collapse or effusion. IMPRESSION:  No active disease. Cardiomegaly. Aortic atherosclerosis. Previous CABG. Electronically Signed   By: Nelson Chimes M.D.   On: 08/20/2019 17:46    EKG: Independently reviewed.  Sinus rhythm with first-degree AV block, RBBB, QTC 513.  QT interval increased since prior tracing.  No acute ischemic changes.  Assessment/Plan Principal Problem:   Chest pain Active Problems:   CAD (coronary artery disease) of artery bypass graft, s/p PCI stent to VG->RCA 04/30/15   Hyperlipidemia   Hypothyroidism   PAF (paroxysmal atrial fibrillation) (HCC)   Chest pain in setting of known CAD/history of multiple MIs status post CABG and PCI: Work-up at present does not appear to be suggestive of ACS.  High sensitive troponin x2 negative and EKG without acute ischemic changes.  Patient is currently chest pain-free. Cardiology recommended admission for observation. -Cardiac monitoring.  Patient received full dose aspirin by EMS.  Sublingual nitroglycerin as needed.  Continue home medications including aspirin, Plavix, and Zetia.  She is not on a beta-blocker due to history of AV block.  EKG as needed for recurrence of chest pain.  Order echocardiogram.  Chronic diastolic CHF: No significant elevation of BNP and no signs of volume overload on chest x-ray. -Hold Lasix at this time  GERD -Continue Pepcid as needed  Hypertension: Currently normotensive. -Continue home amlodipine  Hyperlipidemia: Not on a statin due to history of intolerance to multiple statin medications. -Continue Zetia  Hypothyroidism -Continue Synthroid  Paroxysmal atrial fibrillation: Not on anticoagulation or a rate control agent.  Per previous cardiology note from Dr. Stanford Breed from  office visit 01/07/2016 patient had a brief episode of postop A. fib after CABG with no recurrence.  She wore an event monitor in 2016 for syncope and palpitations which showed normal sinus rhythm with PACs but no A. fib.  As such, she was not started on anticoagulation.  Per triage note, EMS reported an episode of A. fib.  However, since the patient has been in the ED she has not been in A. Fib. -Cardiac monitoring, continue to monitor  DVT prophylaxis: Lovenox Code Status: Patient wishes to be DNR. Family Communication: Son at bedside. Disposition Plan: Status is: Observation  The patient remains OBS appropriate and will d/c before 2 midnights.  Dispo: The patient is from: Home              Anticipated d/c is to: Home              Anticipated d/c date is: 1 day              Patient currently is not medically stable to d/c.  The medical decision making on this patient was of high complexity and the patient is at high risk for clinical deterioration, therefore this is a level 3 visit.  Shela Leff MD [facility]  If 7PM-7AM, please contact night-coverage [email or web address]  08/20/2019, 9:55 PM

## 2019-08-20 NOTE — ED Provider Notes (Signed)
[facility] EMERGENCY DEPARTMENT Provider Note   CSN: [id number] Arrival date & time: 08/20/19  1631     History Chief Complaint  Patient presents with  . Chest Pain    Heather [name] is a 86 y.o. female.  Presents to ER with concern for chest pain.  Significant history of coronary artery disease.  Has undergone CABG, has had prior occlusion of vein graft and stent placement.  Reports that she normally does not have any chest pain however over the past couple weeks has been having intermittent chest pain.  States chest pain radiates to her shoulder blades, similar to pain from prior MIs.  Worse with exertion, seems to be occurring more frequently over the past couple days.  At times occurs at rest.  This afternoon pain up to 9-10 in severity, tightness.  States that her symptoms improved after receiving nitroglycerin with EMS.  Currently having minimal pain 1-2/10, does not want any additional medications for chest pain.  HPI     Past Medical History:  Diagnosis Date  . Arthritis   . CAD (coronary artery disease), native coronary artery 07/14/2013   Catheterization August/2003 showing severe three-vessel disease and moderate mitral regurgitation 08/28/2001 CABG with internal mammary graft to LAD, vein graft to circumflex, and vein graft to posterolateral branch and mitral valve repair by Dr. 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 Patent internal mammary graft with competitive flow in the distal vessel, occluded right coronary artery, occluded circumflex, patent saphenous vein graft to circumflex with mild/moderate disease proximally, minimal LV damage   . Chronic diastolic CHF (congestive heart failure) (HCC)    a. EF 60% by LV gram 01/2009.  . Coronary artery disease    a. s/p MI in 1980;  b. 2003 CABG x 3 (LIMA->LAD, VG->OM, VG->RPL);  c. 01/2009 Cath: 3/3 patent grafts, native  3VD, EF 60%.  Marland Kitchen GERD (gastroesophageal reflux disease)   . H/O mitral valve repair    a. 2003  . Hyperlipidemia 07/14/2013   Intolerance to multiple statins including Crestor, pravastatin, and Lipitor   . Hypertension   . Hypothyroidism   . NSTEMI (non-ST elevated myocardial infarction) ([place])   . PAF (paroxysmal atrial fibrillation) ([place])   . S/P partial hysterectomy 1977    Patient Active Problem List   Diagnosis Date Noted  . Chest pain 08/20/2019  . Elevated troponin   . Dyspnea 01/03/2016  . Anxiety attack 01/03/2016  . PAF (paroxysmal atrial fibrillation) ([place]) 01/03/2016  . Aphasia   . Other hyperlipidemia   . NSTEMI (non-ST elevated myocardial infarction) ([place]) 04/30/2015  . Hypertension, uncontrolled 07/19/2014  . H/O mitral valve repair   . CAD (coronary artery disease) of artery bypass graft, s/p PCI stent to VG->RCA 04/30/15 07/14/2013  . Obesity (BMI 30-39.9) 07/14/2013  . Hyperlipidemia 07/14/2013  . Hypothyroidism 07/14/2013  . Hypertensive heart disease 07/14/2013    Past Surgical History:  Procedure Laterality Date  . ABDOMINAL HYSTERECTOMY  1977  . APPENDECTOMY  1950  . BLADDER SUSPENSION     tac  . CARDIAC CATHETERIZATION  03,05,11  . CARDIAC CATHETERIZATION N/A 04/30/2015   Procedure: Left Heart Cath and Cors/Grafts Angiography;  Surgeon: Belva Crome, MD;  Location: [facility];  Service: Cardiovascular;  Laterality: N/A;  . CARDIAC CATHETERIZATION N/A 04/30/2015   Procedure: Coronary Stent Intervention;  Surgeon: Belva Crome, MD;  Location: [facility]  [facility];  Service: Cardiovascular;  Laterality: N/A;  Proximal and Distal SVG to the PLA  . CARDIAC CATHETERIZATION N/A 01/06/2016   Procedure: Left Heart Cath and Cors/Grafts Angiography;  Surgeon: Jettie Booze, MD;  Location: [facility];  Service: Cardiovascular;  Laterality: N/A;  . CARPAL TUNNEL RELEASE     right and left  . CHOLECYSTECTOMY    . CORONARY ARTERY BYPASS GRAFT   2003  . KNEE ARTHROSCOPY     left  . LEFT HEART CATHETERIZATION WITH CORONARY ANGIOGRAM N/A 07/12/2013   Procedure: LEFT HEART CATHETERIZATION WITH CORONARY ANGIOGRAM;  Surgeon: Jettie Booze, MD;  Location: [facility] C[facility];  Service: Cardiovascular;  Laterality: N/A;  . MITRAL VALVE REPAIR  2003   with cabg  . THYROIDECTOMY  1968  . THYROIDECTOMY, PARTIAL     left     OB History   No obstetric history on file.     Family History  Problem Relation Age of Onset  . Dementia Mother   . Heart Problems Father   . Dementia Sister   . Heart Problems Sister   . Other Other        no premature CAD.  . Tuberculosis Maternal Grandmother   . Heart Problems Paternal Grandfather     Social History   Tobacco Use  . Smoking status: Former Smoker    Quit date: 06/06/1967    Years since quitting: 52.2  . Smokeless tobacco: Former Network engineer  . Vaping Use: Never used  Substance Use Topics  . Alcohol use: No  . Drug use: No    Home Medications Prior to Admission medications   Medication Sig Start Date End Date Taking? Authorizing Provider  acetaminophen (TYLENOL) 500 MG tablet Take 500 mg by mouth every 6 (six) hours as needed for mild pain or moderate pain.    Yes [provider]  amLODipine (NORVASC) 2.5 MG tablet TAKE 1 TABLET BY MOUTH ONCE DAILY NEEDS  APPOINTMENT  WITH  CARDIOLOGIST Patient taking differently: Take 2.5 mg by mouth daily.  06/03/18  Yes Buford Dresser, MD  aspirin EC 81 MG tablet Take 81 mg by mouth daily.   Yes [provider]  cholecalciferol (VITAMIN D3) 25 MCG (1000 UNIT) tablet Take 1,000 Units by mouth daily.   Yes [provider]  clopidogrel (PLAVIX) 75 MG tablet Take 1 tablet (75 mg total) by mouth daily. 07/17/13  Yes Jacolyn Reedy, MD  ezetimibe (ZETIA) 10 MG tablet Take 1 tablet (10 mg total) by mouth daily. 03/10/19 08/20/19 Yes Richardo Priest, MD  famotidine (PEPCID) 10 MG tablet Take 10 mg by mouth daily as  needed for heartburn or indigestion.   Yes [provider]  furosemide (LASIX) 20 MG tablet Take 20 mg by mouth daily. 01/20/19  Yes [provider]  levothyroxine (SYNTHROID, LEVOTHROID) 50 MCG tablet Take 50 mcg by mouth daily.   Yes [provider]  Magnesium 250 MG TABS Take 1 tablet by mouth daily.   Yes [provider]  nitroGLYCERIN (NITROSTAT) 0.4 MG SL tablet Place 0.4 mg under the tongue every 5 (five) minutes as needed for chest pain.   Yes [provider]    Allergies    Atorvastatin, Codeine, Crestor [rosuvastatin], Pravastatin, Simvastatin, and Ticagrelor  Review of Systems   Review of Systems  Constitutional: Negative for chills and fever.  HENT: Negative for ear pain and sore throat.   Eyes: Negative for pain and visual disturbance.  Respiratory: Negative for cough and shortness of breath.   Cardiovascular: Positive for chest pain. Negative for palpitations.  Gastrointestinal: Negative for abdominal pain and vomiting.  Genitourinary: Negative for dysuria and hematuria.  Musculoskeletal: Negative for arthralgias and back pain.  Skin: Negative for color change and rash.  Neurological: Negative for seizures and syncope.  All other systems reviewed and are negative.   Physical Exam Updated Vital Signs BP (!) 141/57 (BP Location: Left Arm)   Pulse 74   Temp 98.3 F (36.8 C) (Oral)   Resp (!) 21   Ht 4\' 11"  (1.499 m)   Wt 79.2 kg   SpO2 99%   BMI 35.26 kg/m   Physical Exam Vitals and nursing note reviewed.  Constitutional:      General: She is not in acute distress.    Appearance: She is well-developed.  HENT:     Head: Normocephalic and atraumatic.  Eyes:     Conjunctiva/sclera: Conjunctivae normal.  Cardiovascular:     Rate and Rhythm: Normal rate and regular rhythm.     Heart sounds: No murmur heard.   Pulmonary:     Effort: Pulmonary effort is normal. No respiratory distress.     Breath sounds: Normal breath  sounds.  Abdominal:     Palpations: Abdomen is soft.     Tenderness: There is no abdominal tenderness.  Musculoskeletal:     Cervical back: Neck supple.  Skin:    General: Skin is warm and dry.  Neurological:     General: No focal deficit present.     Mental Status: She is alert and oriented to person, place, and time.     ED Results / Procedures / Treatments   Labs (all labs ordered are listed, but only abnormal results are displayed) Labs Reviewed  APTT - Abnormal; Notable for the following components:      Result Value   aPTT 37 (*)    All other components within normal limits  CBC WITH DIFFERENTIAL/PLATELET - Abnormal; Notable for the following components:   Hemoglobin 11.3 (*)    HCT 34.2 (*)    All other components within normal limits  BRAIN NATRIURETIC PEPTIDE - Abnormal; Notable for the following components:   B Natriuretic Peptide 146.8 (*)    All other components within normal limits  SARS CORONAVIRUS 2 BY RT PCR (HOSPITAL ORDER, [place])  PROTIME-INR  BASIC METABOLIC PANEL  MAGNESIUM  TROPONIN I (HIGH SENSITIVITY)  TROPONIN I (HIGH SENSITIVITY)  TROPONIN I (HIGH SENSITIVITY)    EKG EKG Interpretation  Date/Time:  Wednesday August 20 2019 16:33:25 EDT Ventricular Rate:  80 PR Interval:    QRS Duration: 161 QT Interval:  444 QTC Calculation: 513 R Axis:   74 Text Interpretation: Sinus or ectopic atrial rhythm Prolonged PR interval Right bundle branch block Confirmed by Madalyn Rob [phone]) on 08/20/2019 4:48:31 PM   Radiology DG Chest Portable 1 View  Result Date: 08/20/2019 CLINICAL DATA:  Chest pain and shortness of breath over the last 2 days. EXAM: PORTABLE CHEST 1 VIEW COMPARISON:  07/17/2016 FINDINGS: Previous median sternotomy and CABG. Chronic cardiomegaly and aortic atherosclerosis. The lungs are clear. No infiltrate, edema, collapse or effusion. IMPRESSION: No active disease. Cardiomegaly. Aortic atherosclerosis.  Previous CABG. Electronically Signed   By: Nelson Chimes M.D.   On: 08/20/2019 17:46    Procedures Procedures (including critical care time)  Medications Ordered in ED Medications  aspirin EC tablet 81 mg (has no administration in time  range)  amLODipine (NORVASC) tablet 2.5 mg (has no administration in time range)  ezetimibe (ZETIA) tablet 10 mg (has no administration in time range)  nitroGLYCERIN (NITROSTAT) SL tablet 0.4 mg (has no administration in time range)  levothyroxine (SYNTHROID) tablet 50 mcg (has no administration in time range)  famotidine (PEPCID) tablet 10 mg (has no administration in time range)  clopidogrel (PLAVIX) tablet 75 mg (has no administration in time range)  cholecalciferol (VITAMIN D3) tablet 1,000 Units (has no administration in time range)  acetaminophen (TYLENOL) tablet 650 mg (has no administration in time range)  enoxaparin (LOVENOX) injection 40 mg (40 mg Subcutaneous Given 08/20/19 2349)    ED Course  I have reviewed the triage vital signs and the nursing notes.  Pertinent labs & imaging results that were available during my care of the patient were reviewed by me and considered in my medical decision making (see chart for details).  Clinical Course as of Aug 19 2348  Wed Aug 20, 2019  1831 D/w Claiborne Billings, rec admit hosp, trend trop and monitor   [RD]    Clinical Course User Index [RD] Lucrezia Starch, MD   MDM Rules/Calculators/A&P                          86 year old lady extensive coronary disease history presented to ER with concern for chest pain. Symptoms improved with nitroglycerin in route, here near chest pain-free. She is well-appearing with stable vital signs. EKG without any ischemic changes. Troponin x2 within normal limits. Reviewed case with on-call cardiology, given patient's significant history, symptomatology, recommends admission to hospitalist service for observation given the new chest pain episodes. Dr. Daryll Brod with hospitalist team  will admit.   Final Clinical Impression(s) / ED Diagnoses Final diagnoses:  Chest pain, unspecified type  Coronary artery disease involving coronary bypass graft of native heart with angina pectoris [facility])    Rx / DC Orders ED Discharge Orders    None       Lucrezia Starch, MD 08/20/19 2350

## 2019-08-21 ENCOUNTER — Observation Stay (HOSPITAL_BASED_OUTPATIENT_CLINIC_OR_DEPARTMENT_OTHER): Payer: Medicare Other

## 2019-08-21 DIAGNOSIS — I25810 Atherosclerosis of coronary artery bypass graft(s) without angina pectoris: Secondary | ICD-10-CM

## 2019-08-21 DIAGNOSIS — I495 Sick sinus syndrome: Secondary | ICD-10-CM

## 2019-08-21 DIAGNOSIS — I340 Nonrheumatic mitral (valve) insufficiency: Secondary | ICD-10-CM | POA: Diagnosis not present

## 2019-08-21 DIAGNOSIS — I361 Nonrheumatic tricuspid (valve) insufficiency: Secondary | ICD-10-CM | POA: Diagnosis not present

## 2019-08-21 DIAGNOSIS — I455 Other specified heart block: Secondary | ICD-10-CM | POA: Diagnosis not present

## 2019-08-21 DIAGNOSIS — R079 Chest pain, unspecified: Secondary | ICD-10-CM | POA: Diagnosis not present

## 2019-08-21 LAB — ECHOCARDIOGRAM COMPLETE
AR max vel: 1.99 cm2
AV Area VTI: 2.2 cm2
AV Area mean vel: 1.97 cm2
AV Mean grad: 5 mmHg
AV Peak grad: 10 mmHg
Ao pk vel: 1.58 m/s
Area-P 1/2: 2.1 cm2
Calc EF: 55.4 %
Height: 59 in
S' Lateral: 3.6 cm
Single Plane A2C EF: 47.5 %
Single Plane A4C EF: 59.4 %
Weight: 2793.6 oz

## 2019-08-21 LAB — TSH: TSH: 1.453 u[IU]/mL (ref 0.350–4.500)

## 2019-08-21 LAB — SURGICAL PCR SCREEN
MRSA, PCR: NEGATIVE
Staphylococcus aureus: NEGATIVE

## 2019-08-21 LAB — MAGNESIUM
Magnesium: 2 mg/dL (ref 1.7–2.4)
Magnesium: 2 mg/dL (ref 1.7–2.4)

## 2019-08-21 MED ORDER — SODIUM CHLORIDE 0.9 % IV SOLN: 250.0000 mL | INTRAVENOUS | Status: DC

## 2019-08-21 MED ORDER — CHLORHEXIDINE GLUCONATE 4 % EX LIQD: 60.0000 mL | Freq: Once | CUTANEOUS | Status: DC

## 2019-08-21 MED ORDER — SODIUM CHLORIDE 0.9% FLUSH
3.0000 mL | Freq: Two times a day (BID) | INTRAVENOUS | Status: DC
  Administered 2019-08-21 – 2019-08-24 (×6): 3 mL via INTRAVENOUS

## 2019-08-21 MED ORDER — CEFAZOLIN SODIUM-DEXTROSE 2-4 GM/100ML-% IV SOLN
2.0000 g | INTRAVENOUS | Status: AC
  Administered 2019-08-22: 2 g via INTRAVENOUS
  Filled 2019-08-21: qty 100

## 2019-08-21 MED ORDER — SODIUM CHLORIDE 0.9 % IV SOLN: INTRAVENOUS | Status: DC

## 2019-08-21 MED ORDER — SODIUM CHLORIDE 0.9% FLUSH: 3.0000 mL | INTRAVENOUS | Status: DC | PRN

## 2019-08-21 MED ORDER — SODIUM CHLORIDE 0.9 % IV SOLN
80.0000 mg | INTRAVENOUS | Status: AC
  Administered 2019-08-22: 80 mg
  Filled 2019-08-21: qty 2

## 2019-08-21 NOTE — Consult Note (Addendum)
Cardiology Consultation:   Patient ID: CALLY NYGARD MRN: [id number]; DOB: Dec 05, 1930  Admit date: 08/20/2019 Date of Consult: 08/21/2019  Primary Care Provider: Leonard Downing, MD [facility] Cardiologist: Shirlee More, MD  [facility] Electrophysiologist:  None    Patient Profile:   DJENEBA BARSCH is a 86 y.o. female with a hx of CAD s/p CABG with MV repair in 2003, chronic stable angina, hypertension, chronic diastolic CHF, mixed hyperlipidemia, RBBB, ?paroxysmal afib not on anticoagulation and hypothyroidism who is being seen today for the evaluation of chest pain and sinus pauses at the request of Dr. Tawanna Solo.  History of Present Illness:   Ms. Gayheart is followed by Dr. Bettina Gavia for the above cardiac issues. Before that she was seen every 6 months by Dr. Wynonia Lawman. She has a history of CABG and MV repair in 2003. She had STEMI in 2015 with PCI of the SVG to the posterior Lateral artery. She also developed paroxysmal afib and heart failure with preserved EF at that time. She had a cath December 2017 in which no further stenting was performed. She has a history of statin intolerance.   She was last seen by Dr. Bettina Gavia 03/10/19 to establish care. She complained of mild vague nonexertional chest pain but no real angina. She had not needed nitroglycerin. [facility]Kpi discussed in the past but patient wanted to try Zetia. She was continued on DAPT (aspirin and plavix), amlodipine, and zetia was started.   The patient presented to the ED 08/20/19 for chest/back pain. Pain started about 2 weeks ago. It is in the middle of her back between the shoulder blades. Described as a tightness. Not radiating. Pain was occuring daily and worse on exertion. Says this pain was similar to before CABG. Aspirin and nitro improved the pain. She also reported intermittent dizziness that began yesterday. It was only when she stood up or was walking. She also noted some vision changes with it. EMS was called who  administered asiprin 324mg  and nitro x 1 prior to ED arrival. Symptoms improved with nitro. EMS reported she became dizzy and went into ?afib.   In the ED BP 141/57, pulse 74, afebrile, RR 21, 99% O2. Labs showed creatinine 0.83, BNP 146, WBC 65, Hgb 11.3. HS troponin negative x 3 (9>10>8). EKG showed sinus with RBBB and new T wave changes lateral leads, q waves inferiorly, and possible ?sinus pause. BNP 146. COVID negative. CXR showed cardiomegaly, aortic atherosclerosis, and no active disease. Patient was admitted for further work-up.   On my interview patient is chest pain free. She is able to complete ADLs. She uses a walker to get around. Overall not very functional. She is limited by arthritic pain. Patient takes aspirin and plavix daily. Patient denies dizziness since admission but had also not been up in the room. She says her PCP follows her thyroid disease.    Past Medical History:  Diagnosis Date  . Arthritis   . CAD (coronary artery disease), native coronary artery 07/14/2013   Catheterization August/2003 showing severe three-vessel disease and moderate mitral regurgitation 08/28/2001 CABG with internal mammary graft to LAD, vein graft to circumflex, and vein graft to posterolateral branch and mitral valve repair by Dr. 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 Patent internal mammary graft with competitive flow in the distal vessel, occluded right coronary artery, occluded circumflex, patent saphenous vein graft to circumflex with mild/moderate disease proximally, minimal LV  damage   . Chronic diastolic CHF (congestive heart failure) (HCC)    a. EF 60% by LV gram 01/2009.  . Coronary artery disease    a. s/p MI in 1980;  b. 2003 CABG x 3 (LIMA->LAD, VG->OM, VG->RPL);  c. 01/2009 Cath: 3/3 patent grafts, native 3VD, EF 60%.  Marland Kitchen GERD (gastroesophageal reflux disease)   . H/O mitral valve repair    a.  2003  . Hyperlipidemia 07/14/2013   Intolerance to multiple statins including Crestor, pravastatin, and Lipitor   . Hypertension   . Hypothyroidism   . NSTEMI (non-ST elevated myocardial infarction) ([place])   . PAF (paroxysmal atrial fibrillation) ([place])   . S/P partial hysterectomy 1977    Past Surgical History:  Procedure Laterality Date  . ABDOMINAL HYSTERECTOMY  1977  . APPENDECTOMY  1950  . BLADDER SUSPENSION     tac  . CARDIAC CATHETERIZATION  03,05,11  . CARDIAC CATHETERIZATION N/A 04/30/2015   Procedure: Left Heart Cath and Cors/Grafts Angiography;  Surgeon: Belva Crome, MD;  Location: [place] [facility];  Service: Cardiovascular;  Laterality: N/A;  . CARDIAC CATHETERIZATION N/A 04/30/2015   Procedure: Coronary Stent Intervention;  Surgeon: Belva Crome, MD;  Location: [place] [facility];  Service: Cardiovascular;  Laterality: N/A;  Proximal and Distal SVG to the PLA  . CARDIAC CATHETERIZATION N/A 01/06/2016   Procedure: Left Heart Cath and Cors/Grafts Angiography;  Surgeon: Jettie Booze, MD;  Location: [place] [facility];  Service: Cardiovascular;  Laterality: N/A;  . CARPAL TUNNEL RELEASE     right and left  . CHOLECYSTECTOMY    . CORONARY ARTERY BYPASS GRAFT  2003  . KNEE ARTHROSCOPY     left  . LEFT HEART CATHETERIZATION WITH CORONARY ANGIOGRAM N/A 07/12/2013   Procedure: LEFT HEART CATHETERIZATION WITH CORONARY ANGIOGRAM;  Surgeon: Jettie Booze, MD;  Location: [facility];  Service: Cardiovascular;  Laterality: N/A;  . MITRAL VALVE REPAIR  2003   with cabg  . THYROIDECTOMY  1968  . THYROIDECTOMY, PARTIAL     left     Home Medications:  Prior to Admission medications   Medication Sig Start Date End Date Taking? Authorizing Provider  acetaminophen (TYLENOL) 500 MG tablet Take 500 mg by mouth every 6 (six) hours as needed for mild pain or moderate pain.    Yes [provider]  amLODipine (NORVASC) 2.5 MG tablet TAKE 1 TABLET BY MOUTH ONCE DAILY  NEEDS  APPOINTMENT  WITH  CARDIOLOGIST Patient taking differently: Take 2.5 mg by mouth daily.  06/03/18  Yes Buford Dresser, MD  aspirin EC 81 MG tablet Take 81 mg by mouth daily.   Yes [provider]  cholecalciferol (VITAMIN D3) 25 MCG (1000 UNIT) tablet Take 1,000 Units by mouth daily.   Yes [provider]  clopidogrel (PLAVIX) 75 MG tablet Take 1 tablet (75 mg total) by mouth daily. 07/17/13  Yes Jacolyn Reedy, MD  ezetimibe (ZETIA) 10 MG tablet Take 1 tablet (10 mg total) by mouth daily. 03/10/19 08/20/19 Yes Richardo Priest, MD  famotidine (PEPCID) 10 MG tablet Take 10 mg by mouth daily as needed for heartburn or indigestion.   Yes [provider]  furosemide (LASIX) 20 MG tablet Take 20 mg by mouth daily. 01/20/19  Yes [provider]  levothyroxine (SYNTHROID, LEVOTHROID) 50 MCG tablet Take 50 mcg by mouth daily.   Yes [provider]  Magnesium 250 MG TABS Take 1 tablet by mouth daily.  Yes [provider]  nitroGLYCERIN (NITROSTAT) 0.4 MG SL tablet Place 0.4 mg under the tongue every 5 (five) minutes as needed for chest pain.   Yes [provider]    Inpatient Medications: Scheduled Meds: . amLODipine  2.5 mg Oral Daily  . aspirin EC  81 mg Oral Daily  . cholecalciferol  1,000 Units Oral Daily  . clopidogrel  75 mg Oral Daily  . enoxaparin (LOVENOX) injection  40 mg Subcutaneous Q24H  . ezetimibe  10 mg Oral Daily  . levothyroxine  50 mcg Oral Daily   Continuous Infusions:  PRN Meds: acetaminophen, famotidine, nitroGLYCERIN  Allergies:    Allergies  Allergen Reactions  . Atorvastatin     Muscle ache  . Codeine Nausea And Vomiting  . Crestor [Rosuvastatin]     Muscle ache  . Pravastatin     Muscle ache  . Simvastatin     Muscle cramps   . Ticagrelor     Dyspnea     Social History:   Social History   Socioeconomic History  . Marital status: Widowed    Spouse name: Not on file  . Number  of children: Not on file  . Years of education: Not on file  . Highest education level: Not on file  Occupational History  . Not on file  Tobacco Use  . Smoking status: Former Smoker    Quit date: 06/06/1967    Years since quitting: 52.2  . Smokeless tobacco: Former Network engineer  . Vaping Use: Never used  Substance and Sexual Activity  . Alcohol use: No  . Drug use: No  . Sexual activity: Not Currently  Other Topics Concern  . Not on file  Social History Narrative   Lives in [place].  Widowed.   Social Determinants of Health   Financial Resource Strain:   . Difficulty of Paying Living Expenses:   Food Insecurity:   . Worried About Charity fundraiser in the Last Year:   . Arboriculturist in the Last Year:   Transportation Needs:   . Film/video editor (Medical):   Marland Kitchen Lack of Transportation (Non-Medical):   Physical Activity:   . Days of Exercise per Week:   . Minutes of Exercise per Session:   Stress:   . Feeling of Stress :   Social Connections:   . Frequency of Communication with Friends and Family:   . Frequency of Social Gatherings with Friends and Family:   . Attends Religious Services:   . Active Member of Clubs or Organizations:   . Attends Archivist Meetings:   Marland Kitchen Marital Status:   Intimate Partner Violence:   . Fear of Current or Ex-Partner:   . Emotionally Abused:   Marland Kitchen Physically Abused:   . Sexually Abused:     Family History:   Family History  Problem Relation Age of Onset  . Dementia Mother   . Heart Problems Father   . Dementia Sister   . Heart Problems Sister   . Other Other        no premature CAD.  . Tuberculosis Maternal Grandmother   . Heart Problems Paternal Grandfather      ROS:  Please see the history of present illness.  All other ROS reviewed and negative.     Physical Exam/Data:   Vitals:   08/20/19 2202 08/20/19 2235 08/21/19 0425 08/21/19 1015  BP: (!) 122/54 (!) 141/57 (!) 141/55   Pulse: 79 74 74  77    Resp: (!) 23 (!) 21 20 18   Temp: 97.9 F (36.6 C) 98.3 F (36.8 C) 98.5 F (36.9 C)   TempSrc: Oral Oral Oral   SpO2: 97% 99% 100% 99%  Weight:  79.2 kg 79.2 kg   Height:  4\' 11"  (1.499 m)      Intake/Output Summary (Last 24 hours) at 08/21/2019 1029 Last data filed at 08/21/2019 0045 Gross per 24 hour  Intake 240 ml  Output --  Net 240 ml   Last 3 Weights 08/21/2019 08/20/2019 08/20/2019  Weight (lbs) 174 lb 9.6 oz 174 lb 9.6 oz 180 lb  Weight (kg) 79.198 kg 79.198 kg 81.647 kg     Body mass index is 35.26 kg/m.  General:  Well nourished, well developed, in no acute distress HEENT: normal Lymph: no adenopathy Neck: no JVD Endocrine:  No thryomegaly Vascular: No carotid bruits; FA pulses 2+ bilaterally without bruits  Cardiac:  normal S1, S2; RRR; + murmur  Lungs:  clear to auscultation bilaterally, no wheezing, rhonchi or rales  Abd: soft, nontender, no hepatomegaly  Ext: no edema Musculoskeletal:  No deformities, BUE and BLE strength normal and equal Skin: warm and dry  Neuro:  CNs 2-12 intact, no focal abnormalities noted Psych:  Normal affect   EKG:  The EKG was personally reviewed and demonstrates: NSR, 80 bpm, RBBB, inferior q waves, T wave chages lateral leads, first degree AV block Telemetry:  Telemetry was personally reviewed and demonstrates:  NSR, HR 60s, overnight pauses (longest 3.92seconds), intermittent junctional rhythm with HR in the upper 30s  Relevant CV Studies:  Echo 08/21/19 1. Hypokinesis of the basal inferior and inferolateral wall with overall  preserved LV function; mild LVH; s/p MV repair with mild MS (mean gradient  5 mmHg) and mild MR; mild LAE; mild RV dysfunction.  2. Left ventricular ejection fraction, by estimation, is 55 to 60%. The  left ventricle has normal function. The left ventricle demonstrates  regional wall motion abnormalities (see scoring diagram/findings for  description). There is mild left ventricular  hypertrophy. Left  ventricular diastolic parameters are indeterminate.  3. Right ventricular systolic function is mildly reduced. The right  ventricular size is normal. There is normal pulmonary artery systolic  pressure.  4. Left atrial size was mildly dilated.  5. The mitral valve has been repaired/replaced. Mild mitral valve  regurgitation. Mild mitral stenosis.  6. The aortic valve is tricuspid. Aortic valve regurgitation is not  visualized. Mild to moderate aortic valve sclerosis/calcification is  present, without any evidence of aortic stenosis.  7. The inferior vena cava is normal in size with greater than 50%  respiratory variability, suggesting right atrial pressure of 3 mmHg.   Cardiac cath 46/5681  LV end diastolic pressure is mildly elevated.  The left ventricular ejection fraction is 50-55% by visual estimate.  There is no aortic valve stenosis.  Mid RCA lesion, 100 %stenosed.  Dist RCA lesion, 100 %stenosed.  Prox Cx lesion, 100 %stenosed.  Mid LAD to Dist LAD lesion, 90 %stenosed.  Stents in SVG to RCA are widely patent.  SVG to OM is occluded.  LIMA to LAD is patent.   No change from end result of cath in April 2017.  Widely patent stents in the SVG to RCA.  Lateral wall defect on nuclear is likely due to occluded circumflex and occluded SVG to OM.  She has faint collaterals to her lateral wall.  Continue medical therapy.     Coronary Diagrams  Diagnostic Dominance: Right   Laboratory Data:  High Sensitivity Troponin:   Recent Labs  Lab 08/20/19 1720 08/20/19 1950 08/20/19 2244  TROPONINIHS 9 10 8      Chemistry Recent Labs  Lab 08/20/19 1720  NA 137  K 4.0  CL 104  CO2 24  GLUCOSE 95  BUN 10  CREATININE 0.83  CALCIUM 9.6  GFRNONAA >60  GFRAA >60  ANIONGAP 9    No results for input(s): PROT, ALBUMIN, AST, ALT, ALKPHOS, BILITOT in the last 168 hours. Hematology Recent Labs  Lab 08/20/19 1720  WBC 6.0  RBC 3.89  HGB 11.3*  HCT 34.2*  MCV  87.9  MCH 29.0  MCHC 33.0  RDW 13.6  PLT 207   BNP Recent Labs  Lab 08/20/19 1720  BNP 146.8*    DDimer No results for input(s): DDIMER in the last 168 hours.   Radiology/Studies:  DG Chest Portable 1 View  Result Date: 08/20/2019 CLINICAL DATA:  Chest pain and shortness of breath over the last 2 days. EXAM: PORTABLE CHEST 1 VIEW COMPARISON:  07/17/2016 FINDINGS: Previous median sternotomy and CABG. Chronic cardiomegaly and aortic atherosclerosis. The lungs are clear. No infiltrate, edema, collapse or effusion. IMPRESSION: No active disease. Cardiomegaly. Aortic atherosclerosis. Previous CABG. Electronically Signed   By: Nelson Chimes M.D.   On: 08/20/2019 17:46   ECHOCARDIOGRAM COMPLETE  Result Date: 08/21/2019    ECHOCARDIOGRAM REPORT   Patient Name:   BONNELL PLACZEK Date of Exam: 08/21/2019 Medical Rec #:  [id number]        Height:       59.0 in Accession #:    [id number]       Weight:       174.6 lb Date of Birth:  1931/01/12         BSA:          1.741 m Patient Age:    54 years         BP:           141/55 mmHg Patient Gender: F                HR:           72 bpm. Exam Location:  Inpatient Procedure: 2D Echo, 3D Echo, Cardiac Doppler and Color Doppler Indications:    R07.9* Chest pain, unspecified  History:        Patient has prior history of Echocardiogram examinations, most                 recent 07/17/2013. CAD, Angina and Previous Myocardial Infarction,                 Prior CABG and Abnormal ECG, Mitral Valve Disease,                 Arrythmias:Atrial Fibrillation, Signs/Symptoms:Dyspnea; Risk                 Factors:Former Smoker, Hypertension and Dyslipidemia. Mitral                 valve repair.  Sonographer:    Roseanna Rainbow RDCS Referring Phys: [id number] [place]  1. Hypokinesis of the basal inferior and inferolateral wall with overall preserved LV function; mild LVH; s/p MV repair with mild MS (mean gradient 5 mmHg) and mild MR; mild LAE; mild RV dysfunction.  2. Left  ventricular ejection fraction, by estimation, is 55 to 60%. The left ventricle has normal function. The left ventricle  demonstrates regional wall motion abnormalities (see scoring diagram/findings for description). There is mild left ventricular  hypertrophy. Left ventricular diastolic parameters are indeterminate.  3. Right ventricular systolic function is mildly reduced. The right ventricular size is normal. There is normal pulmonary artery systolic pressure.  4. Left atrial size was mildly dilated.  5. The mitral valve has been repaired/replaced. Mild mitral valve regurgitation. Mild mitral stenosis.  6. The aortic valve is tricuspid. Aortic valve regurgitation is not visualized. Mild to moderate aortic valve sclerosis/calcification is present, without any evidence of aortic stenosis.  7. The inferior vena cava is normal in size with greater than 50% respiratory variability, suggesting right atrial pressure of 3 mmHg. FINDINGS  Left Ventricle: Left ventricular ejection fraction, by estimation, is 55 to 60%. The left ventricle has normal function. The left ventricle demonstrates regional wall motion abnormalities. The left ventricular internal cavity size was normal in size. There is mild left ventricular hypertrophy. Left ventricular diastolic parameters are indeterminate. Right Ventricle: The right ventricular size is normal.Right ventricular systolic function is mildly reduced. There is normal pulmonary artery systolic pressure. The tricuspid regurgitant velocity is 2.58 m/s, and with an assumed right atrial pressure of 8 mmHg, the estimated right ventricular systolic pressure is 00.8 mmHg. Left Atrium: Left atrial size was mildly dilated. Right Atrium: Right atrial size was normal in size. Pericardium: There is no evidence of pericardial effusion. Mitral Valve: The mitral valve has been repaired/replaced. Normal mobility of the mitral valve leaflets. Mild mitral valve regurgitation. Mild mitral valve stenosis.  MV peak gradient, 16.2 mmHg. The mean mitral valve gradient is 5.0 mmHg. Tricuspid Valve: The tricuspid valve is normal in structure. Tricuspid valve regurgitation is mild . No evidence of tricuspid stenosis. Aortic Valve: The aortic valve is tricuspid. Aortic valve regurgitation is not visualized. Mild to moderate aortic valve sclerosis/calcification is present, without any evidence of aortic stenosis. Aortic valve mean gradient measures 5.0 mmHg. Aortic valve peak gradient measures 10.0 mmHg. Aortic valve area, by VTI measures 2.20 cm. Pulmonic Valve: The pulmonic valve was normal in structure. Pulmonic valve regurgitation is not visualized. No evidence of pulmonic stenosis. Aorta: The aortic root is normal in size and structure. Venous: The inferior vena cava is normal in size with greater than 50% respiratory variability, suggesting right atrial pressure of 3 mmHg.  Additional Comments: Hypokinesis of the basal inferior and inferolateral wall with overall preserved LV function; mild LVH; s/p MV repair with mild MS (mean gradient 5 mmHg) and mild MR; mild LAE; mild RV dysfunction.  LEFT VENTRICLE PLAX 2D LVIDd:         4.70 cm      Diastology LVIDs:         3.60 cm      LV e' lateral:   4.35 cm/s LV PW:         1.10 cm      LV E/e' lateral: 22.9 LV IVS:        1.40 cm      LV e' medial:    4.95 cm/s LVOT diam:     2.10 cm      LV E/e' medial:  20.1 LV SV:         72 LV SV Index:   42 LVOT Area:     3.46 cm  LV Volumes (MOD) LV vol d, MOD A2C: 118.0 ml LV vol d, MOD A4C: 118.0 ml LV vol s, MOD A2C: 62.0 ml LV vol s, MOD A4C: 48.0 ml LV  SV MOD A2C:     56.0 ml LV SV MOD A4C:     118.0 ml LV SV MOD BP:      67.5 ml IVC IVC diam: 2.10 cm LEFT ATRIUM             Index       RIGHT ATRIUM           Index LA diam:        2.70 cm 1.55 cm/m  RA Area:     19.10 cm LA Vol (A2C):   70.1 ml 40.27 ml/m RA Volume:   56.90 ml  32.68 ml/m LA Vol (A4C):   54.6 ml 31.36 ml/m LA Biplane Vol: 59.0 ml 33.89 ml/m  AORTIC VALVE  AV Area (Vmax):    1.99 cm AV Area (Vmean):   1.97 cm AV Area (VTI):     2.20 cm AV Vmax:           158.00 cm/s AV Vmean:          109.000 cm/s AV VTI:            0.329 m AV Peak Grad:      10.0 mmHg AV Mean Grad:      5.0 mmHg LVOT Vmax:         90.90 cm/s LVOT Vmean:        61.900 cm/s LVOT VTI:          0.209 m LVOT/AV VTI ratio: 0.64  AORTA Ao Root diam: 3.40 cm Ao Asc diam:  3.70 cm MITRAL VALVE                TRICUSPID VALVE MV Area (PHT): 2.10 cm     TR Peak grad:   26.6 mmHg MV Peak grad:  16.2 mmHg    TR Vmax:        258.00 cm/s MV Mean grad:  5.0 mmHg MV Vmax:       2.02 m/s     SHUNTS MV Vmean:      95.8 cm/s    Systemic VTI:  0.21 m MV Decel Time: 361 msec     Systemic Diam: 2.10 cm MV E velocity: 99.63 cm/s MV A velocity: 108.00 cm/s MV E/A ratio:  0.92 Kirk Ruths MD Electronically signed by Kirk Ruths MD Signature Date/Time: 08/21/2019/10:14:39 AM    Final      Assessment and Plan:   Chest pain/CAD s/p CABG in 2003 with subsequent PCI - Last cath 12/2015 showed LVEF 50-55%, 199% stenosis of mRCA, 100% stenosis of dRCA, 100% stenosis pCx, 90% mid LAD to dLAD, occluded SVG to OM, patent LIMA to LAD, patent stent in SVG to RCA. There was no change from cath in April. Medical therapy was continued.  - Presents with CP similar to prior MIs - HS troponin negative x 3 - EKG appears to have new T wave changes in the lateral leads - Patient is currently chest pain free - Echo this admission with preserved EF  - Given age, functional status, atypical pain, do not recommend cath.  Not a good Myoview candidate as lexiscan contraindicated given her bradyarrythmia  Sinus pauses - Intermittent sinus pauses noted on telemetry, up to 4.3 seconds.  Had pauses overnight but have also occurred while awake this morning, had 3.9-second pause at 11 AM, family confirms was awake during this time. -Did not appear symptomatic during episodes but had an episode of near syncope yesterday morning. -  continue telemetry - check  TSH/ Mag -Consult EP  Chronic diastolic CHF - Echo 9924 showed LVEF 55-60%, mild hypokinesis of inf myocardium, moderately dilated LA,  - cath in 2017 showed LVEF 50-55% - repeat echo showed LVEF 55-60%, hypokinesis of the basal inferior and inferolateral wall with overall preserved LV function, mild LVH, s/p Mv repair with mild MS gradient, mildly reduced RV function, mildly dilated LA - No signs of acute CHF  HTN - amlodipine 2.5 mg daily - pressures reasonable  HLD - statin intolerance, myalgias - zetia - recheck lipid panel  Possible paroxysmal Afib - Do not see evidence of this on past EKGs - no afib on telemetry - not on a blood thinner  Thyroid disease - check TSH - levothyroxine  S/p MV repair in 2003 - echo showed mild MS (mean gradient 79mmHg)  For questions or updates, please contact [facility] Please consult [email or web address] for contact info under    Signed, Cadence Ninfa Meeker, PA-C  08/21/2019 10:29 AM   Patient seen and examined.  Agree with above documentation.  Ms. Kilmartin is a 86 year old woman with history of CABG/mitral valve repair in 2683, diastolic heart failure, hyperlipidemia, hypertension, reported paroxysmal A. fib but not on anticoagulation, hypertension, hypothyroidism who we are consulted to see by Dr. Tawanna Solo for evaluation of chest pain and sinus pauses.  Cardiac history includes CABG in 2003, STEMI in 2015 with PCI of SVG to posteriorlateral branch.  Last cath in 2017 showed 100s and mid RCA lesion, and a percent proximal circumflex lesion, 90% mid to distal LAD lesion, patent LIMA-LAD, patent stent in SVG-RCA, occluded SVG-OM.  She presented with back pain to the ED yesterday.  Reports pain started 2 weeks ago.  States the pain is on her back between her shoulder blades.  Reports pain is similar to what she was having prior to her CABG.  States that pain will usually start in the morning and can last all day.  She also had  an episode of lightheadedness yesterday, she reports she was reading the newspaper and suddenly felt like she was going to pass out.  In the ED, vitals notable for pulse of 60, BP 141/66, SPO2 97% on room air.  Labs notable for creatinine 0.8, hemoglobin 11.3, WBC 6, BNP 147, high-sensitivity troponin 9 >10 >8, Covid negative.  Chest x-ray was unremarkable.  EKG shows sinus rhythm with first-degree AV block, right bundle branch block.  Telemetry personally reviewed and shows sinus pauses up to 4.3 seconds (last night at 2241), 3.9-second pause at 1042 this morning. On exam, patient is alert and oriented, regular rate and rhythm, no murmurs, lungs CTAB, no LE edema or JVD.  Echo today shows EF 55 to 60%, basal inferior/inferolateral hypokinesis, mild LVH, mitral valve repair with mild MS and mild MR, mild RV dysfunction.  For her sinus pauses, she has been having approximately 4-second pauses while awake.  Does not appear symptomatic with episodes she has had this morning, but reports she had a near syncopal episode yesterday.  Recommend EP evaluation for PPM.  For her back pain, she reports this was similar to the pain she was having prior to her CABG.  Could represent anginal equivalent, but she reported hours of pain and her troponins are negative.  Given negative troponins, and considering her poor functional status and age, would not be a good candidate for cath.  She is not a [place] [facility] candidate given her bradyarrhythmia.    Donato Heinz, MD

## 2019-08-21 NOTE — Consult Note (Addendum)
Cardiology Consultation:   Patient ID: [name] MRN: [id number]; DOB: January 27, 1930  Admit date: 08/20/2019 Date of Consult: 08/21/2019  Primary Care Provider: Leonard Downing, MD [facility] Cardiologist: Heather More, MD (previously Heather [name]) [place] Electrophysiologist:  None    Patient Profile:   Heather [name] is a 86 y.o. female with a hx of CAD (CABG 2003), VHD s/p MV repair (2003), HTN, HLD, hypothyroidism (s/p thyroidectomy), AFib (this history is unclear and Heather [name] is not on a/c), diastolic CHF, who is being seen today for the evaluation of pauses at the request of Heather [name].  History of Present Illness:   Heather [name] was admitted last night with c/o CP/back pain/dizziness, Heather [name] was admitted for work up and cardiology evaluation. EKG noted new T wave changes, cardiology has seen Heather [name], given HS Trop neg and preserved LVEF, advanced age and current functional status and atypical symptoms, no ischemic w/u is planned  LABS K+ 4.0 Mag 2.0 BUN/Creat 10/0.83 BNP 146 HS Trop 9, 10, 8 WBC 6.0 H/H 11/34 Plts 207 TSH 1.453  Heather [name] tells me that in general Heather [name] has felt like Heather [name] is a bit SOB with exertion.  Heather [name] does very little with terrible hip pain, though gets up, care for herself, likes to cook, but does no real physical work, and spends much of Heather [name] time seated. Yesterday Heather [name] was seated when Heather [name] suddenly felt dizzy, like a breeze washed over Heather [name], things went black and thinks if Heather [name] had been standing Heather [name] may have fell or fainted.  This happened a couple times through the day.  Once Heather [name] grand daughter noticed and called for EMS (Heather [name] brother). Notes mention [perhaps they saw AFib though I have reviewed the EMS record and do not see AF. Heather [name] BP somewhat high, HRs looked OK.  On telemetry here Heather [name] was noted to have some pauses, both nocturnal and awake.  One 3.8 seconds and confirmed to be awake though laying in bed and asymptomatic. EP is asked to evaluate for  possible PPM need  Heather [name] feels well here, no recurrent symptoms of any kind.  Has not been OOB.  Heather [name] reports that Heather [name] hip is Heather [name] main limiting factor and this is so severe that Heather [name] would not want to live many [name] years longer with this, but is in no rush to go either and would consider PPM if we felt indicated  Past Medical History:  Diagnosis Date   Arthritis    CAD (coronary artery disease), native coronary artery 07/14/2013   Catheterization August/2003 showing severe three-vessel disease and moderate mitral regurgitation 08/28/2001 CABG with internal mammary graft to LAD, vein graft to circumflex, and vein graft to posterolateral branch and mitral valve repair by Heather [name] 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 Patent internal mammary graft with competitive flow in the distal vessel, occluded right coronary artery, occluded circumflex, patent saphenous vein graft to circumflex with mild/moderate disease proximally, minimal LV damage    Chronic diastolic CHF (congestive heart failure) ([place])    a. EF 60% by LV gram 01/2009.   Coronary artery disease    a. s/p MI in 1980;  b. 2003 CABG x 3 (LIMA->LAD, VG->OM, VG->RPL);  c. 01/2009 Cath: 3/3 patent grafts, native 3VD, EF 60%.   GERD (gastroesophageal reflux disease)    H/O mitral valve repair    a. 2003   Hyperlipidemia 07/14/2013   Intolerance to multiple statins  including Crestor, pravastatin, and Lipitor    Hypertension    Hypothyroidism    NSTEMI (non-ST elevated myocardial infarction) ([place])    PAF (paroxysmal atrial fibrillation) ([place])    S/P partial hysterectomy 1977    Past Surgical History:  Procedure Laterality Date   ABDOMINAL HYSTERECTOMY  [place]     tac   CARDIAC CATHETERIZATION  03,05,11   CARDIAC CATHETERIZATION N/A 04/30/2015   Procedure: Left Heart Cath and Cors/Grafts Angiography;  Surgeon: Heather Crome, MD;  Location: [place] [facility];  Service: Cardiovascular;  Laterality: N/A;   CARDIAC CATHETERIZATION N/A 04/30/2015   Procedure: Coronary Stent Intervention;  Surgeon: Heather Crome, MD;  Location: [place] [facility];  Service: Cardiovascular;  Laterality: N/A;  Proximal and Distal SVG to the PLA   CARDIAC CATHETERIZATION N/A 01/06/2016   Procedure: Left Heart Cath and Cors/Grafts Angiography;  Surgeon: Heather Booze, MD;  Location: [place] [facility];  Service: Cardiovascular;  Laterality: N/A;   CARPAL TUNNEL RELEASE     right and left   CHOLECYSTECTOMY     CORONARY ARTERY BYPASS GRAFT  2003   KNEE ARTHROSCOPY     left   LEFT HEART CATHETERIZATION WITH CORONARY ANGIOGRAM N/A 07/12/2013   Procedure: LEFT HEART CATHETERIZATION WITH CORONARY ANGIOGRAM;  Surgeon: Heather Booze, MD;  [facility]: [facility];  Service: Cardiovascular;  Laterality: N/A;   MITRAL VALVE REPAIR  2003   with cabg   THYROIDECTOMY  1968   THYROIDECTOMY, PARTIAL     left     Home Medications:  Prior to Admission medications   Medication Sig Start Date End Date Taking? Authorizing Provider  acetaminophen (TYLENOL) 500 MG tablet Take 500 mg by mouth every 6 (six) hours as needed for mild pain or moderate pain.    Yes [provider]  amLODipine (NORVASC) 2.5 MG tablet TAKE 1 TABLET BY MOUTH ONCE DAILY NEEDS  APPOINTMENT  WITH  CARDIOLOGIST Patient taking differently: Take 2.5 mg by mouth daily.  06/03/18  Yes Heather Dresser, MD  aspirin EC 81 MG tablet Take 81 mg by mouth daily.   Yes [provider]  cholecalciferol (VITAMIN D3) 25 MCG (1000 UNIT) tablet Take 1,000 Units by mouth daily.   Yes [provider]  clopidogrel (PLAVIX) 75 MG tablet Take 1 tablet (75 mg total) by mouth daily. 07/17/13  Yes Heather Reedy, MD  ezetimibe (ZETIA) 10 MG tablet Take 1 tablet (10 mg total) by mouth daily. 03/10/19 08/20/19 Yes Heather Priest, MD  famotidine (PEPCID) 10 MG  tablet Take 10 mg by mouth daily as needed for heartburn or indigestion.   Yes [provider]  furosemide (LASIX) 20 MG tablet Take 20 mg by mouth daily. 01/20/19  Yes [provider]  levothyroxine (SYNTHROID, LEVOTHROID) 50 MCG tablet Take 50 mcg by mouth daily.   Yes [provider]  Magnesium 250 MG TABS Take 1 tablet by mouth daily.   Yes [provider]  nitroGLYCERIN (NITROSTAT) 0.4 MG SL tablet Place 0.4 mg under the tongue every 5 (five) minutes as needed for chest pain.   Yes [provider]    Inpatient Medications: Scheduled Meds:  amLODipine  2.5 mg Oral Daily   aspirin EC  81 mg Oral Daily   cholecalciferol  1,000 Units Oral Daily   clopidogrel  75 mg Oral Daily   enoxaparin (LOVENOX) injection  40 mg Subcutaneous Q24H  ezetimibe  10 mg Oral Daily   levothyroxine  50 mcg Oral Daily   Continuous Infusions:   PRN Meds: acetaminophen, famotidine, nitroGLYCERIN  Allergies:    Allergies  Allergen Reactions   Atorvastatin     Muscle ache   Codeine Nausea And Vomiting   Crestor [Rosuvastatin]     Muscle ache   Pravastatin     Muscle ache   Simvastatin     Muscle cramps    Ticagrelor     Dyspnea     Social History:   Social History   Socioeconomic History   Marital status: Widowed    Spouse name: Not on file   Number of children: Not on file   Years of education: Not on file   Highest education level: Not on file  Occupational History   Not on file  Tobacco Use   Smoking status: Former Smoker    Quit date: 06/06/1967    Years since quitting: 52.2   Smokeless tobacco: Former Counsellor Use: Never used  Substance and Sexual Activity   Alcohol use: No   Drug use: No   Sexual activity: Not Currently  Other Topics Concern   Not on file  Social History Narrative   Lives in [place].  Widowed.   Social Determinants of Health   Financial Resource Strain:    Difficulty of Paying Living  Expenses:   Food Insecurity:    Worried About Charity fundraiser in the Last Year:    Arboriculturist in the Last Year:   Transportation Needs:    Film/video editor (Medical):    Lack of Transportation (Non-Medical):   Physical Activity:    Days of Exercise per Week:    Minutes of Exercise per Session:   Stress:    Feeling of Stress :   Social Connections:    Frequency of Communication with Friends and Family:    Frequency of Social Gatherings with Friends and Family:    Attends Religious Services:    Active Member of Clubs or Organizations:    Attends Music therapist:    Marital Status:   Intimate Partner Violence:    Fear of Current or Ex-Partner:    Emotionally Abused:    Physically Abused:    Sexually Abused:     Family History:   Family History  Problem Relation Age of Onset   Dementia Mother    Heart Problems Father    Dementia Sister    Heart Problems Sister    Other Other        no premature CAD.   Tuberculosis Maternal Grandmother    Heart Problems Paternal Grandfather      ROS:  Please see the history of present illness.  All other ROS reviewed and negative.     Physical Exam/Data:   Vitals:   08/20/19 2235 08/21/19 0425 08/21/19 1015 08/21/19 1202  BP: (!) 141/57 (!) 141/55  137/65  Pulse: 74 74 77 79  Resp: (!) 21 20 18 19   Temp: 98.3 F (36.8 C) 98.5 F (36.9 C)  98.2 F (36.8 C)  TempSrc: Oral Oral  Oral  SpO2: 99% 100% 99% 100%  Weight: 79.2 kg 79.2 kg    Height: 4\' 11"  (1.499 m)       Intake/Output Summary (Last 24 hours) at 08/21/2019 1406 Last data filed at 08/21/2019 0045 Gross per 24 hour  Intake 240 ml  Output --  Net 240  ml   Last 3 Weights 08/21/2019 08/20/2019 08/20/2019  Weight (lbs) 174 lb 9.6 oz 174 lb 9.6 oz 180 lb  Weight (kg) 79.198 kg 79.198 kg 81.647 kg     Body mass index is 35.26 kg/m.  General:  Well nourished, well developed, in no acute distress HEENT: normal Lymph: no adenopathy Neck: no  JVD Endocrine:  No thryomegaly Vascular: No carotid bruits Cardiac:   RRR;softy SM, no gallops or rubs Lungs:  CTA b/l, no wheezing, rhonchi or rales  Abd: soft, nontender Ext: no edema Musculoskeletal:  No deformities, age appropriate atrophy Skin: warm and dry  Neuro:  No gross focal motor abnormalities noted Psych:  Normal affect   EKG:  The EKG was personally reviewed and demonstrates:    SR 80bpm, 1st degree AVblock, , RBBB, PR 254ms, sinus pause 2.4 sec SR 74bpm, 1st degree AVBlock, RBBB  03/10/19: SR 97, 1st degree AVBlock, RBBB 2018: SR 81, 1st degree AVblock, RBBB   Telemetry:  Telemetry was personally reviewed and demonstrates:   SR generally 70's-80's Sinus pauses 2-4 seconds both nocturnal and awake Intermittent junctional rhythm 40's    Relevant CV Studies:  Echo 08/21/19  1. Hypokinesis of the basal inferior and inferolateral wall with overall  preserved LV function; mild LVH; s/p MV repair with mild MS (mean gradient  5 mmHg) and mild MR; mild LAE; mild RV dysfunction.   2. Left ventricular ejection fraction, by estimation, is 55 to 60%. The  left ventricle has normal function. The left ventricle demonstrates  regional wall motion abnormalities (see scoring diagram/findings for  description). There is mild left ventricular   hypertrophy. Left ventricular diastolic parameters are indeterminate.   3. Right ventricular systolic function is mildly reduced. The right  ventricular size is normal. There is normal pulmonary artery systolic  pressure.   4. Left atrial size was mildly dilated.   5. The mitral valve has been repaired/replaced. Mild mitral valve  regurgitation. Mild mitral stenosis.   6. The aortic valve is tricuspid. Aortic valve regurgitation is not  visualized. Mild to moderate aortic valve sclerosis/calcification is  present, without any evidence of aortic stenosis.   7. The inferior vena cava is normal in size with greater than 50%  respiratory  variability, suggesting right atrial pressure of 3 mmHg.   Cardiac cath 37/1062 LV end diastolic pressure is mildly elevated. The left ventricular ejection fraction is 50-55% by visual estimate. There is no aortic valve stenosis. Mid RCA lesion, 100 %stenosed. Dist RCA lesion, 100 %stenosed. Prox Cx lesion, 100 %stenosed. Mid LAD to Dist LAD lesion, 90 %stenosed. Stents in SVG to RCA are widely patent. SVG to OM is occluded. LIMA to LAD is patent.   No change from end result of cath in April 2017.  Widely patent stents in the SVG to RCA.  Lateral wall defect on nuclear is likely due to occluded circumflex and occluded SVG to OM.  Heather [name] has faint collaterals to Heather [name] lateral wall.  Continue medical therapy.   Laboratory Data:  High Sensitivity Troponin:   Recent Labs  Lab 08/20/19 1720 08/20/19 1950 08/20/19 2244  TROPONINIHS 9 10 8      Chemistry Recent Labs  Lab 08/20/19 1720  NA 137  K 4.0  CL 104  CO2 24  GLUCOSE 95  BUN 10  CREATININE 0.83  CALCIUM 9.6  GFRNONAA >60  GFRAA >60  ANIONGAP 9    No results for input(s): PROT, ALBUMIN, AST, ALT, ALKPHOS, BILITOT in the last  168 hours. Hematology Recent Labs  Lab 08/20/19 1720  WBC 6.0  RBC 3.89  HGB 11.3*  HCT 34.2*  MCV 87.9  MCH 29.0  MCHC 33.0  RDW 13.6  PLT 207   BNP Recent Labs  Lab 08/20/19 1720  BNP 146.8*    DDimer No results for input(s): DDIMER in the last 168 hours.   Radiology/Studies:  DG Chest Portable 1 View Result Date: 08/20/2019 CLINICAL DATA:  Chest pain and shortness of breath over the last 2 days. EXAM: PORTABLE CHEST 1 VIEW COMPARISON:  07/17/2016 FINDINGS: Previous median sternotomy and CABG. Chronic cardiomegaly and aortic atherosclerosis. The lungs are clear. No infiltrate, edema, collapse or effusion. IMPRESSION: No active disease. Cardiomegaly. Aortic atherosclerosis. Previous CABG. Electronically Signed   By: Nelson Chimes M.D.   On: 08/20/2019 17:46    Assessment and Plan:    1. Dizzy spells, near syncope  Longstanding baseline conduction system disease Sinus pauses 2-4 seconds, nocturnal and daytime/awake Intermittent junctional rhythm 40's No reversible causes Neg HS trop (x3) with atypical CP  While here in bed Heather [name] has not had symptoms.  Heather [name] is very sedentary but is up and around with a walker and yesterday began having dizzy spells where Heather [name] vision goes black as well, but has not fallen or fully fainted. I think Heather [name] has indication for PPM with symptomatic bradycardia.  I discussed this with the patient and Heather [name] son at bedside. Discussed the procedure, potential risks and benefits. Heather [name] would like to proceed if the MD agrees.  Dr. Quentin Ore will see Heather [name] shortly.     For questions or updates, please contact [place] Please consult [email or web address] for contact info under    Signed, Baldwin [place], PA-C  08/21/2019 2:06 PM

## 2019-08-21 NOTE — Progress Notes (Signed)
  Echocardiogram 2D Echocardiogram has been performed.  Heather [name] 08/21/2019, 9:45 AM

## 2019-08-21 NOTE — Progress Notes (Signed)
Pt having multiple pauses with longest one lasting 4.27 secs. Pt is asymptomatic.  Notified Hospitalist on call. Placed pacer pads on pt and received order for stat EKG.

## 2019-08-21 NOTE — Progress Notes (Signed)
PROGRESS NOTE    Heather [name]  BSJ:[id number] DOB: 24-Jul-1930 DOA: 08/20/2019 PCP: Leonard Downing, MD   Brief Narrative:  Patient is 86 year old female with history of multivessel coronary artery disease, status post CABG and PCI, history of mitral valve repair,chronic diastolic CHF, GERD, hypertension, hyperlipidemia with history of statin intolerance, hypothyroidism, paroxysmal atrial fibrillation not on anticoagulation presenting to the ED via EMS with complaints of chest pain.  She received aspirin 324 mg and 1 nitro prior to ED arrival.  EMS reported that patient was not orthostatic but became dizzy and went into A. fib while standing.Patient reported having exertional chest tightness and associated pain between her shoulder blades for the past few weeks.  After admission, she remained hemodynamically stable.  Her chest pain/tightness has resolved.  She was found to have 4-second pause on telemetry.  Cardiology following and consulting EP.  Assessment & Plan:   Principal Problem:   Chest pain Active Problems:   CAD (coronary artery disease) of artery bypass graft, s/p PCI stent to VG->RCA 04/30/15   Hyperlipidemia   Hypothyroidism   PAF (paroxysmal atrial fibrillation) (HCC)   Chest pain/discomfort: Has history of significant coronary artery disease.  Troponins have been negative.  EKG did not show any ischemic changes.  Currently she is chest pain-free.  Cardiology not recommending cardiac cath at this point.  Continue home aspirin, Plavix, Zetia.  Echo has been ordered.  Heart block/pause: Telemetry notified about 4-second pause while being awake.  She is asymptomatic.  She had a near syncope episode yesterday.  Cardiology recommended EP evaluation for possible pacemaker placement.  Monitor on telemetry.  Avoid AV nodal blocking agents.  GERD: Continue Pepcid  Hypertension: Continue current medications.  Continue to monitor blood pressure  Hyperlipidemia: On  Zetia  Hypothyroidism: On Synthyroid  Chronic diastolic congestive heart failure: Currently euvolemic.  Takes Lasix at home.  Mild elevated BNP.          DVT prophylaxis:Lovenox Code Status: DNR Family Communication: Wife and daughter at the bedside Status is: Observation  The patient remains OBS appropriate and will d/c before 2 midnights.  Dispo: The patient is from: Home              Anticipated d/c is to: Home              Anticipated d/c date is: 1 day              Patient currently is not medically stable to d/c. Waiting from cardiology clearance before discharge.  EP should see her as per cardiology     Consultants: Cardiology  Procedures:None  Antimicrobials:  Anti-infectives (From admission, onward)   None      Subjective:  Patient seen and examined at the bedside this morning.  Hemodynamically stable.  Comfortable.  Sitting at the edge of the bed.  Son and daughter were at the bedside.  Denies any complaints.  Chest pain-free  Objective: Vitals:   08/20/19 2235 08/21/19 0425 08/21/19 1015 08/21/19 1202  BP: (!) 141/57 (!) 141/55  137/65  Pulse: 74 74 77 79  Resp: (!) 21 20 18 19   Temp: 98.3 F (36.8 C) 98.5 F (36.9 C)  98.2 F (36.8 C)  TempSrc: Oral Oral  Oral  SpO2: 99% 100% 99% 100%  Weight: 79.2 kg 79.2 kg    Height: 4\' 11"  (1.499 m)       Intake/Output Summary (Last 24 hours) at 08/21/2019 1443 Last data filed at 08/21/2019 0045 Gross  per 24 hour  Intake 240 ml  Output --  Net 240 ml   Filed Weights   08/20/19 1638 08/20/19 2235 08/21/19 0425  Weight: 81.6 kg 79.2 kg 79.2 kg    Examination:  General exam: Appears calm and comfortable ,Not in distress, very pleasant elderly female  HEENT:PERRL,Oral mucosa moist, Ear/Nose normal on gross exam Respiratory system: Bilateral equal air entry, normal vesicular breath sounds, no wheezes or crackles  Cardiovascular system: S1 & S2 heard, RRR. No JVD, murmurs, rubs, gallops or clicks. No  pedal edema. Gastrointestinal system: Abdomen is nondistended, soft and nontender. No organomegaly or masses felt. Normal bowel sounds heard. Central nervous system: Alert and oriented. No focal neurological deficits. Extremities: No edema, no clubbing ,no cyanosis, distal peripheral pulses palpable. Skin: No rashes, lesions or ulcers,no icterus ,no pallor   Data Reviewed: I have personally reviewed following labs and imaging studies  CBC: Recent Labs  Lab 08/20/19 1720  WBC 6.0  NEUTROABS 4.0  HGB 11.3*  HCT 34.2*  MCV 87.9  PLT 979   Basic Metabolic Panel: Recent Labs  Lab 08/20/19 1720 08/21/19 0025 08/21/19 1215  NA 137  --   --   K 4.0  --   --   CL 104  --   --   CO2 24  --   --   GLUCOSE 95  --   --   BUN 10  --   --   CREATININE 0.83  --   --   CALCIUM 9.6  --   --   MG  --  2.0 2.0   GFR: Estimated Creatinine Clearance: 43.4 mL/min (by C-G formula based on SCr of 0.83 mg/dL). Liver Function Tests: No results for input(s): AST, ALT, ALKPHOS, BILITOT, PROT, ALBUMIN in the last 168 hours. No results for input(s): LIPASE, AMYLASE in the last 168 hours. No results for input(s): AMMONIA in the last 168 hours. Coagulation Profile: Recent Labs  Lab 08/20/19 1720  INR 1.1   Cardiac Enzymes: No results for input(s): CKTOTAL, CKMB, CKMBINDEX, TROPONINI in the last 168 hours. BNP (last 3 results) No results for input(s): PROBNP in the last 8760 hours. HbA1C: No results for input(s): HGBA1C in the last 72 hours. CBG: No results for input(s): GLUCAP in the last 168 hours. Lipid Profile: No results for input(s): CHOL, HDL, LDLCALC, TRIG, CHOLHDL, LDLDIRECT in the last 72 hours. Thyroid Function Tests: Recent Labs    08/21/19 1215  TSH 1.453   Anemia Panel: No results for input(s): VITAMINB12, FOLATE, FERRITIN, TIBC, IRON, RETICCTPCT in the last 72 hours. Sepsis Labs: No results for input(s): PROCALCITON, LATICACIDVEN in the last 168 hours.  Recent  Results (from the past 240 hour(s))  SARS Coronavirus 2 by RT PCR (hospital order, performed in [facility] hospital lab) Nasopharyngeal Nasopharyngeal Swab     Status: None   Collection Time: 08/20/19  5:20 PM   Specimen: Nasopharyngeal Swab  Result Value Ref Range Status   SARS Coronavirus 2 NEGATIVE NEGATIVE Final    Comment: (NOTE) SARS-CoV-2 target nucleic acids are NOT DETECTED.  The SARS-CoV-2 RNA is generally detectable in upper and lower respiratory specimens during the acute phase of infection. The lowest concentration of SARS-CoV-2 viral copies this assay can detect is 250 copies / mL. A negative result does not preclude SARS-CoV-2 infection and should not be used as the sole basis for treatment or other patient management decisions.  A negative result may occur with improper specimen collection / handling, submission  of specimen other than nasopharyngeal swab, presence of viral mutation(s) within the areas targeted by this assay, and inadequate number of viral copies (<250 copies / mL). A negative result must be combined with clinical observations, patient history, and epidemiological information.  Fact Sheet for Patients:   StrictlyIdeas.no  Fact Sheet for Healthcare Providers: BankingDealers.co.za  This test is not yet approved or  cleared by the [place] FDA and has been authorized for detection and/or diagnosis of SARS-CoV-2 by FDA under an Emergency Use Authorization (EUA).  This EUA will remain in effect (meaning this test can be used) for the duration of the COVID-19 declaration under Section 564(b)(1) of the Act, 21 U.S.C. section 360bbb-3(b)(1), unless the authorization is terminated or revoked sooner.  Performed at [facility], [place] [address], [place], [place] [address]          Radiology Studies: DG Chest Portable 1 View  Result Date: 08/20/2019 CLINICAL DATA:  Chest pain and shortness of  breath over the last 2 days. EXAM: PORTABLE CHEST 1 VIEW COMPARISON:  07/17/2016 FINDINGS: Previous median sternotomy and CABG. Chronic cardiomegaly and aortic atherosclerosis. The lungs are clear. No infiltrate, edema, collapse or effusion. IMPRESSION: No active disease. Cardiomegaly. Aortic atherosclerosis. Previous CABG. Electronically Signed   By: Nelson Chimes M.D.   On: 08/20/2019 17:46   ECHOCARDIOGRAM COMPLETE  Result Date: 08/21/2019    ECHOCARDIOGRAM REPORT   Patient Name:   Heather [name] Date of Exam: 08/21/2019 Medical Rec #:  [id number]        Height:       59.0 in Accession #:    [id number]       Weight:       174.6 lb Date of Birth:  October 29, 1930         BSA:          1.741 m Patient Age:    59 years         BP:           141/55 mmHg Patient Gender: F                HR:           72 bpm. Exam Location:  Inpatient Procedure: 2D Echo, 3D Echo, Cardiac Doppler and Color Doppler Indications:    R07.9* Chest pain, unspecified  History:        Patient has prior history of Echocardiogram examinations, most                 recent 07/17/2013. CAD, Angina and Previous Myocardial Infarction,                 Prior CABG and Abnormal ECG, Mitral Valve Disease,                 Arrythmias:Atrial Fibrillation, Signs/Symptoms:Dyspnea; Risk                 Factors:Former Smoker, Hypertension and Dyslipidemia. Mitral                 valve repair.  Sonographer:    Roseanna Rainbow RDCS Referring Phys: [id number] [place]  1. Hypokinesis of the basal inferior and inferolateral wall with overall preserved LV function; mild LVH; s/p MV repair with mild MS (mean gradient 5 mmHg) and mild MR; mild LAE; mild RV dysfunction.  2. Left ventricular ejection fraction, by estimation, is 55 to 60%. The left ventricle has normal function. The left ventricle demonstrates regional wall  motion abnormalities (see scoring diagram/findings for description). There is mild left ventricular  hypertrophy. Left ventricular diastolic  parameters are indeterminate.  3. Right ventricular systolic function is mildly reduced. The right ventricular size is normal. There is normal pulmonary artery systolic pressure.  4. Left atrial size was mildly dilated.  5. The mitral valve has been repaired/replaced. Mild mitral valve regurgitation. Mild mitral stenosis.  6. The aortic valve is tricuspid. Aortic valve regurgitation is not visualized. Mild to moderate aortic valve sclerosis/calcification is present, without any evidence of aortic stenosis.  7. The inferior vena cava is normal in size with greater than 50% respiratory variability, suggesting right atrial pressure of 3 mmHg. FINDINGS  Left Ventricle: Left ventricular ejection fraction, by estimation, is 55 to 60%. The left ventricle has normal function. The left ventricle demonstrates regional wall motion abnormalities. The left ventricular internal cavity size was normal in size. There is mild left ventricular hypertrophy. Left ventricular diastolic parameters are indeterminate. Right Ventricle: The right ventricular size is normal.Right ventricular systolic function is mildly reduced. There is normal pulmonary artery systolic pressure. The tricuspid regurgitant velocity is 2.58 m/s, and with an assumed right atrial pressure of 8 mmHg, the estimated right ventricular systolic pressure is 70.6 mmHg. Left Atrium: Left atrial size was mildly dilated. Right Atrium: Right atrial size was normal in size. Pericardium: There is no evidence of pericardial effusion. Mitral Valve: The mitral valve has been repaired/replaced. Normal mobility of the mitral valve leaflets. Mild mitral valve regurgitation. Mild mitral valve stenosis. MV peak gradient, 16.2 mmHg. The mean mitral valve gradient is 5.0 mmHg. Tricuspid Valve: The tricuspid valve is normal in structure. Tricuspid valve regurgitation is mild . No evidence of tricuspid stenosis. Aortic Valve: The aortic valve is tricuspid. Aortic valve regurgitation is  not visualized. Mild to moderate aortic valve sclerosis/calcification is present, without any evidence of aortic stenosis. Aortic valve mean gradient measures 5.0 mmHg. Aortic valve peak gradient measures 10.0 mmHg. Aortic valve area, by VTI measures 2.20 cm. Pulmonic Valve: The pulmonic valve was normal in structure. Pulmonic valve regurgitation is not visualized. No evidence of pulmonic stenosis. Aorta: The aortic root is normal in size and structure. Venous: The inferior vena cava is normal in size with greater than 50% respiratory variability, suggesting right atrial pressure of 3 mmHg.  Additional Comments: Hypokinesis of the basal inferior and inferolateral wall with overall preserved LV function; mild LVH; s/p MV repair with mild MS (mean gradient 5 mmHg) and mild MR; mild LAE; mild RV dysfunction.  LEFT VENTRICLE PLAX 2D LVIDd:         4.70 cm      Diastology LVIDs:         3.60 cm      LV e' lateral:   4.35 cm/s LV PW:         1.10 cm      LV E/e' lateral: 22.9 LV IVS:        1.40 cm      LV e' medial:    4.95 cm/s LVOT diam:     2.10 cm      LV E/e' medial:  20.1 LV SV:         72 LV SV Index:   42 LVOT Area:     3.46 cm  LV Volumes (MOD) LV vol d, MOD A2C: 118.0 ml LV vol d, MOD A4C: 118.0 ml LV vol s, MOD A2C: 62.0 ml LV vol s, MOD A4C: 48.0 ml LV SV MOD  A2C:     56.0 ml LV SV MOD A4C:     118.0 ml LV SV MOD BP:      67.5 ml IVC IVC diam: 2.10 cm LEFT ATRIUM             Index       RIGHT ATRIUM           Index LA diam:        2.70 cm 1.55 cm/m  RA Area:     19.10 cm LA Vol (A2C):   70.1 ml 40.27 ml/m RA Volume:   56.90 ml  32.68 ml/m LA Vol (A4C):   54.6 ml 31.36 ml/m LA Biplane Vol: 59.0 ml 33.89 ml/m  AORTIC VALVE AV Area (Vmax):    1.99 cm AV Area (Vmean):   1.97 cm AV Area (VTI):     2.20 cm AV Vmax:           158.00 cm/s AV Vmean:          109.000 cm/s AV VTI:            0.329 m AV Peak Grad:      10.0 mmHg AV Mean Grad:      5.0 mmHg LVOT Vmax:         90.90 cm/s LVOT Vmean:         61.900 cm/s LVOT VTI:          0.209 m LVOT/AV VTI ratio: 0.64  AORTA Ao Root diam: 3.40 cm Ao Asc diam:  3.70 cm MITRAL VALVE                TRICUSPID VALVE MV Area (PHT): 2.10 cm     TR Peak grad:   26.6 mmHg MV Peak grad:  16.2 mmHg    TR Vmax:        258.00 cm/s MV Mean grad:  5.0 mmHg MV Vmax:       2.02 m/s     SHUNTS MV Vmean:      95.8 cm/s    Systemic VTI:  0.21 m MV Decel Time: 361 msec     Systemic Diam: 2.10 cm MV E velocity: 99.63 cm/s MV A velocity: 108.00 cm/s MV E/A ratio:  0.92 Kirk Ruths MD Electronically signed by Kirk Ruths MD Signature Date/Time: 08/21/2019/10:14:39 AM    Final         Scheduled Meds: . amLODipine  2.5 mg Oral Daily  . aspirin EC  81 mg Oral Daily  . cholecalciferol  1,000 Units Oral Daily  . clopidogrel  75 mg Oral Daily  . enoxaparin (LOVENOX) injection  40 mg Subcutaneous Q24H  . ezetimibe  10 mg Oral Daily  . levothyroxine  50 mcg Oral Daily   Continuous Infusions:   LOS: 0 days    Time spent: 35 mins.More than 50% of that time was spent in counseling and/or coordination of care.      Shelly Coss, MD [facility] P8/05/2019, 2:43 PM

## 2019-08-22 ENCOUNTER — Encounter (HOSPITAL_COMMUNITY)
Admission: EM | Disposition: A | Discharge status: Home / Self Care | Payer: Self-pay | Source: Home / Self Care | Attending: Internal Medicine

## 2019-08-22 DIAGNOSIS — I110 Hypertensive heart disease with heart failure: Secondary | ICD-10-CM | POA: Diagnosis present

## 2019-08-22 DIAGNOSIS — M1990 Unspecified osteoarthritis, unspecified site: Secondary | ICD-10-CM | POA: Diagnosis present

## 2019-08-22 DIAGNOSIS — Z950 Presence of cardiac pacemaker: Secondary | ICD-10-CM | POA: Insufficient documentation

## 2019-08-22 DIAGNOSIS — I495 Sick sinus syndrome: Principal | ICD-10-CM

## 2019-08-22 DIAGNOSIS — R001 Bradycardia, unspecified: Secondary | ICD-10-CM

## 2019-08-22 DIAGNOSIS — E890 Postprocedural hypothyroidism: Secondary | ICD-10-CM | POA: Diagnosis present

## 2019-08-22 DIAGNOSIS — E782 Mixed hyperlipidemia: Secondary | ICD-10-CM | POA: Diagnosis present

## 2019-08-22 DIAGNOSIS — I5032 Chronic diastolic (congestive) heart failure: Secondary | ICD-10-CM | POA: Diagnosis present

## 2019-08-22 DIAGNOSIS — I2510 Atherosclerotic heart disease of native coronary artery without angina pectoris: Secondary | ICD-10-CM | POA: Diagnosis present

## 2019-08-22 DIAGNOSIS — Z7902 Long term (current) use of antithrombotics/antiplatelets: Secondary | ICD-10-CM | POA: Diagnosis not present

## 2019-08-22 DIAGNOSIS — Z87891 Personal history of nicotine dependence: Secondary | ICD-10-CM | POA: Diagnosis not present

## 2019-08-22 DIAGNOSIS — I700 Atherosclerosis of aorta: Secondary | ICD-10-CM | POA: Diagnosis present

## 2019-08-22 DIAGNOSIS — I252 Old myocardial infarction: Secondary | ICD-10-CM | POA: Diagnosis not present

## 2019-08-22 DIAGNOSIS — Z955 Presence of coronary angioplasty implant and graft: Secondary | ICD-10-CM | POA: Diagnosis not present

## 2019-08-22 DIAGNOSIS — Z66 Do not resuscitate: Secondary | ICD-10-CM | POA: Diagnosis present

## 2019-08-22 DIAGNOSIS — R079 Chest pain, unspecified: Secondary | ICD-10-CM | POA: Diagnosis present

## 2019-08-22 DIAGNOSIS — E785 Hyperlipidemia, unspecified: Secondary | ICD-10-CM | POA: Diagnosis present

## 2019-08-22 DIAGNOSIS — Z7982 Long term (current) use of aspirin: Secondary | ICD-10-CM | POA: Diagnosis not present

## 2019-08-22 DIAGNOSIS — M25559 Pain in unspecified hip: Secondary | ICD-10-CM | POA: Diagnosis present

## 2019-08-22 DIAGNOSIS — I25810 Atherosclerosis of coronary artery bypass graft(s) without angina pectoris: Secondary | ICD-10-CM | POA: Diagnosis present

## 2019-08-22 DIAGNOSIS — I2582 Chronic total occlusion of coronary artery: Secondary | ICD-10-CM | POA: Diagnosis present

## 2019-08-22 DIAGNOSIS — K219 Gastro-esophageal reflux disease without esophagitis: Secondary | ICD-10-CM | POA: Diagnosis present

## 2019-08-22 DIAGNOSIS — I440 Atrioventricular block, first degree: Secondary | ICD-10-CM | POA: Diagnosis present

## 2019-08-22 DIAGNOSIS — Z888 Allergy status to other drugs, medicaments and biological substances status: Secondary | ICD-10-CM | POA: Diagnosis not present

## 2019-08-22 DIAGNOSIS — I4510 Unspecified right bundle-branch block: Secondary | ICD-10-CM | POA: Diagnosis present

## 2019-08-22 DIAGNOSIS — Z20822 Contact with and (suspected) exposure to covid-19: Secondary | ICD-10-CM | POA: Diagnosis present

## 2019-08-22 DIAGNOSIS — I480 Paroxysmal atrial fibrillation: Secondary | ICD-10-CM | POA: Diagnosis present

## 2019-08-22 HISTORY — DX: Presence of cardiac pacemaker: Z95.0

## 2019-08-22 HISTORY — PX: PACEMAKER IMPLANT: EP1218

## 2019-08-22 SURGERY — PACEMAKER IMPLANT

## 2019-08-22 MED ORDER — CEFAZOLIN SODIUM-DEXTROSE 2-4 GM/100ML-% IV SOLN
INTRAVENOUS | Status: AC
  Filled 2019-08-22: qty 100

## 2019-08-22 MED ORDER — HEPARIN (PORCINE) IN NACL 1000-0.9 UT/500ML-% IV SOLN
INTRAVENOUS | Status: DC | PRN
  Administered 2019-08-22: 500 mL

## 2019-08-22 MED ORDER — FENTANYL CITRATE (PF) 100 MCG/2ML IJ SOLN
INTRAMUSCULAR | Status: DC | PRN
  Administered 2019-08-22: 12.5 ug via INTRAVENOUS
  Administered 2019-08-22: 25 ug via INTRAVENOUS

## 2019-08-22 MED ORDER — LIDOCAINE HCL (PF) 1 % IJ SOLN
INTRAMUSCULAR | Status: AC
  Filled 2019-08-22: qty 60

## 2019-08-22 MED ORDER — SODIUM CHLORIDE 0.9 % IV SOLN
INTRAVENOUS | Status: AC
  Filled 2019-08-22: qty 2

## 2019-08-22 MED ORDER — FENTANYL CITRATE (PF) 100 MCG/2ML IJ SOLN
INTRAMUSCULAR | Status: AC
  Filled 2019-08-22: qty 2

## 2019-08-22 MED ORDER — MIDAZOLAM HCL 5 MG/5ML IJ SOLN
INTRAMUSCULAR | Status: AC
  Filled 2019-08-22: qty 5

## 2019-08-22 MED ORDER — MIDAZOLAM HCL 5 MG/5ML IJ SOLN
INTRAMUSCULAR | Status: DC | PRN
  Administered 2019-08-22 (×2): 1 mg via INTRAVENOUS

## 2019-08-22 MED ORDER — HEPARIN (PORCINE) IN NACL 1000-0.9 UT/500ML-% IV SOLN
INTRAVENOUS | Status: AC
  Filled 2019-08-22: qty 500

## 2019-08-22 MED ORDER — LIDOCAINE HCL (PF) 1 % IJ SOLN
INTRAMUSCULAR | Status: DC | PRN
  Administered 2019-08-22: 60 mL

## 2019-08-22 SURGICAL SUPPLY — 8 items
CABLE SURGICAL S-101-97-12 (CABLE) ×2 IMPLANT
IPG PACE AZUR XT DR MRI W1DR01 (Pacemaker) IMPLANT
LEAD CAPSURE NOVUS 45CM (Lead) ×1 IMPLANT
LEAD CAPSURE NOVUS 5076-52CM (Lead) ×1 IMPLANT
PACE AZURE XT DR MRI W1DR01 (Pacemaker) ×2 IMPLANT
PAD PRO RADIOLUCENT 2001M-C (PAD) ×2 IMPLANT
SHEATH 7FR PRELUDE SNAP 13 (SHEATH) ×2 IMPLANT
TRAY PACEMAKER INSERTION (PACKS) ×2 IMPLANT

## 2019-08-22 NOTE — Discharge Instructions (Signed)
    Supplemental Discharge Instructions for  Pacemaker/Defibrillator Patients  Activity No heavy lifting or vigorous activity with your left/right arm for 6 to 8 weeks.  Do not raise your left/right arm above your head for one week.  Gradually raise your affected arm as drawn below.              08/26/2019                08/27/2019                08/28/2019               08/29/2019 __  NO DRIVING until cleared to at your wound check visit .  WOUND CARE - Keep the wound area clean and dry.  Do not get this area wet, no showers until cleared to at your wound check visit - The tape/steri-strips on your wound will fall off; do not pull them off.  No bandage is needed on the site.  DO  NOT apply any creams, oils, or ointments to the wound area. - If you notice any drainage or discharge from the wound, any swelling or bruising at the site, or you develop a fever > 101? F after you are discharged home, call the office at once.  Special Instructions - You are still able to use cellular telephones; use the ear opposite the side where you have your pacemaker/defibrillator.  Avoid carrying your cellular phone near your device. - When traveling through airports, show security personnel your identification card to avoid being screened in the metal detectors.  Ask the security personnel to use the hand wand. - Avoid arc welding equipment, MRI testing (magnetic resonance imaging), TENS units (transcutaneous nerve stimulators).  Call the office for questions about other devices. - Avoid electrical appliances that are in poor condition or are not properly grounded. - Microwave ovens are safe to be near or to operate.

## 2019-08-22 NOTE — Progress Notes (Signed)
In d/w Dr. Quentin Ore, if pacer pocket looks OK tomorrw , would be OK to resume ASA, Plavix 48 hours post implant.  Tommye Standard, PA-C

## 2019-08-22 NOTE — Progress Notes (Signed)
PROGRESS NOTE    Heather [name]  KPT:[id number] DOB: 1930-09-24 DOA: 08/20/2019 PCP: Leonard Downing, MD   Brief Narrative:  Patient is 86 year old female with history of multivessel coronary artery disease, status post CABG and PCI, history of mitral valve repair,chronic diastolic CHF, GERD, hypertension, hyperlipidemia with history of statin intolerance, hypothyroidism, paroxysmal atrial fibrillation not on anticoagulation presenting to the ED via EMS with complaints of chest pain.  She received aspirin 324 mg and 1 nitro prior to ED arrival.  EMS reported that patient was not orthostatic but became dizzy and went into A. fib while standing.Patient reported having exertional chest tightness and associated pain between her shoulder blades for the past few weeks.  After admission, she remained hemodynamically stable.  Her chest pain/tightness has resolved.  She was found to have 4-second pause on telemetry.  Cardiology following and consulting EP.  08/22/2019: Patient seen alongside patient's son.  Patient has undergone dual chamber permanent pacemaker placement today.  Input from the cardiology team is highly appreciated.  Cardiology is directing care.  No further syncope.  No chest pain.  No other constitutional symptoms reported.  Assessment & Plan:   Principal Problem:   Chest pain Active Problems:   CAD (coronary artery disease) of artery bypass graft, s/p PCI stent to VG->RCA 04/30/15   Hyperlipidemia   Hypothyroidism   PAF (paroxysmal atrial fibrillation) (HCC)   Sinus node dysfunction (HCC)   Chest pain/discomfort: Has history of significant coronary artery disease.  Troponins have been negative.  EKG did not show any ischemic changes.  Currently she is chest pain-free.  Cardiology not recommending cardiac cath at this point.  Continue home aspirin, Plavix, Zetia.  Echo has been ordered. 08/22/2019: Resolved.  Advised to avoid NSAIDs.   Heart block/pause: Telemetry notified about  4-second pause while being awake.  She is asymptomatic.  She had a near syncope episode yesterday.  Cardiology recommended EP evaluation for possible pacemaker placement.  Monitor on telemetry.  Avoid AV nodal blocking agents. 08/22/2019: Kindly see above.  Pacemaker has been placed.  GERD: Continue Pepcid  Hypertension: Continue current medications.  Continue to monitor blood pressure 07/06/2019: Continue Norvasc 2.5 Mg p.o. once daily.  Can increase dosage as deemed necessary.  Hyperlipidemia: On Zetia  Hypothyroidism: On Synthyroid  Chronic diastolic congestive heart failure: Currently euvolemic.  Takes Lasix at home.  Mild elevated BNP.     DVT prophylaxis:Lovenox Code Status: DNR Family Communication: Wife and daughter at the bedside Status is: Observation  The patient remains OBS appropriate and will d/c before 2 midnights.  Dispo: The patient is from: Home              Anticipated d/c is to: Home              Anticipated d/c date is: 1 day              Patient currently is not medically stable to d/c. Waiting from cardiology clearance before discharge.  EP should see her as per cardiology     Consultants: Cardiology  Procedures:None  Antimicrobials:  Anti-infectives (From admission, onward)   Start     Dose/Rate Route Frequency Ordered Stop   08/21/19 1730  gentamicin (GARAMYCIN) 80 mg in sodium chloride 0.9 % 500 mL irrigation        80 mg Irrigation On call 08/21/19 1726 08/22/19 1541   08/21/19 1730  ceFAZolin (ANCEF) IVPB 2g/100 mL premix        2 g  200 mL/hr over 30 Minutes Intravenous On call 08/21/19 1726 08/22/19 1541      Subjective: No complaints. No chest pain. No shortness of breath.  Objective: Vitals:   08/22/19 1635 08/22/19 1650 08/22/19 1720 08/22/19 1735  BP: (!) 130/47 (!) 129/57 98/85 107/75  Pulse: 60 77 68 74  Resp: 16 15 19  (!) 23  Temp:      TempSrc:      SpO2: 96% 96% 100% 100%  Weight:      Height:        Intake/Output  Summary (Last 24 hours) at 08/22/2019 1817 Last data filed at 08/21/2019 2251 Gross per 24 hour  Intake 3 ml  Output --  Net 3 ml   Filed Weights   08/20/19 2235 08/21/19 0425 08/22/19 0432  Weight: 79.2 kg 79.2 kg 78.5 kg    Examination:  General exam: Patient is not in any distress.  Patient is awake and alert.  Left upper extremity is supported.   HEENT: Mild pallor.  No jaundice.  Respiratory system: Clear to auscultation.   Cardiovascular system: S1 & S2 heard Gastrointestinal system: Soft and nontender.  Organs were not palpable.  Central nervous system: Alert and oriented.  Patient moves all extremities.   Extremities: No edema,  Data Reviewed: I have personally reviewed following labs and imaging studies  CBC: Recent Labs  Lab 08/20/19 1720  WBC 6.0  NEUTROABS 4.0  HGB 11.3*  HCT 34.2*  MCV 87.9  PLT 976   Basic Metabolic Panel: Recent Labs  Lab 08/20/19 1720 08/21/19 0025 08/21/19 1215  NA 137  --   --   K 4.0  --   --   CL 104  --   --   CO2 24  --   --   GLUCOSE 95  --   --   BUN 10  --   --   CREATININE 0.83  --   --   CALCIUM 9.6  --   --   MG  --  2.0 2.0   GFR: Estimated Creatinine Clearance: 43.2 mL/min (by C-G formula based on SCr of 0.83 mg/dL). Liver Function Tests: No results for input(s): AST, ALT, ALKPHOS, BILITOT, PROT, ALBUMIN in the last 168 hours. No results for input(s): LIPASE, AMYLASE in the last 168 hours. No results for input(s): AMMONIA in the last 168 hours. Coagulation Profile: Recent Labs  Lab 08/20/19 1720  INR 1.1   Cardiac Enzymes: No results for input(s): CKTOTAL, CKMB, CKMBINDEX, TROPONINI in the last 168 hours. BNP (last 3 results) No results for input(s): PROBNP in the last 8760 hours. HbA1C: No results for input(s): HGBA1C in the last 72 hours. CBG: No results for input(s): GLUCAP in the last 168 hours. Lipid Profile: No results for input(s): CHOL, HDL, LDLCALC, TRIG, CHOLHDL, LDLDIRECT in the last 72  hours. Thyroid Function Tests: Recent Labs    08/21/19 1215  TSH 1.453   Anemia Panel: No results for input(s): VITAMINB12, FOLATE, FERRITIN, TIBC, IRON, RETICCTPCT in the last 72 hours. Sepsis Labs: No results for input(s): PROCALCITON, LATICACIDVEN in the last 168 hours.  Recent Results (from the past 240 hour(s))  SARS Coronavirus 2 by RT PCR (hospital order, performed in [facility] hospital lab) Nasopharyngeal Nasopharyngeal Swab     Status: None   Collection Time: 08/20/19  5:20 PM   Specimen: Nasopharyngeal Swab  Result Value Ref Range Status   SARS Coronavirus 2 NEGATIVE NEGATIVE Final    Comment: (NOTE) SARS-CoV-2 target nucleic  acids are NOT DETECTED.  The SARS-CoV-2 RNA is generally detectable in upper and lower respiratory specimens during the acute phase of infection. The lowest concentration of SARS-CoV-2 viral copies this assay can detect is 250 copies / mL. A negative result does not preclude SARS-CoV-2 infection and should not be used as the sole basis for treatment or other patient management decisions.  A negative result may occur with improper specimen collection / handling, submission of specimen other than nasopharyngeal swab, presence of viral mutation(s) within the areas targeted by this assay, and inadequate number of viral copies (<250 copies / mL). A negative result must be combined with clinical observations, patient history, and epidemiological information.  Fact Sheet for Patients:   StrictlyIdeas.no  Fact Sheet for Healthcare Providers: BankingDealers.co.za  This test is not yet approved or  cleared by the [place] FDA and has been authorized for detection and/or diagnosis of SARS-CoV-2 by FDA under an Emergency Use Authorization (EUA).  This EUA will remain in effect (meaning this test can be used) for the duration of the COVID-19 declaration under Section 564(b)(1) of the Act, 21  U.S.C. section 360bbb-3(b)(1), unless the authorization is terminated or revoked sooner.  Performed at [facility], [place] [address] [place], [place] [address]   Surgical PCR screen     Status: None   Collection Time: 08/21/19  7:39 PM   Specimen: Nasal Mucosa; Nasal Swab  Result Value Ref Range Status   MRSA, PCR NEGATIVE NEGATIVE Final   Staphylococcus aureus NEGATIVE NEGATIVE Final    Comment: (NOTE) The Xpert SA Assay (FDA approved for NASAL specimens in patients 34 years of age and older), is one component of a comprehensive surveillance program. It is not intended to diagnose infection nor to guide or monitor treatment. Performed at [facility], [place] [address], [place], [place] [address]          Radiology Studies: EP PPM/ICD IMPLANT  Result Date: 08/22/2019 SURGEON:  Will Meredith Leeds, MD, Lars Mage, MD   PREPROCEDURE DIAGNOSIS:  Sick sinus syndrome   POSTPROCEDURE DIAGNOSIS:  Sick sinus syndrome    PROCEDURES:  1. Left upper extremity venography.   INTRODUCTION: Heather [name] is a 86 y.o. female  with a history of bradycardia who presents today for pacemaker implantation.  The patient reports intermittent episodes of dizziness over the past few months.  No reversible causes have been identified.  The patient therefore presents today for pacemaker implantation.   DESCRIPTION OF PROCEDURE:  Informed written consent was obtained, and  the patient was brought to the electrophysiology lab in a fasting state.  The patient required no sedation for the procedure today.  The patients left chest was prepped and draped in the usual sterile fashion by the EP lab staff. The skin overlying the left deltopectoral region was infiltrated with lidocaine for local analgesia.  A 4-cm incision was made over the left deltopectoral region.  A left subcutaneous pacemaker pocket was fashioned using a combination of sharp and blunt dissection. Electrocautery was required to assure  hemostasis.  RA/RV Lead Placement: The left axillary vein was cannulated.  Through the left axillary vein, a Medtronic model 5076 (serial number [id number]) right atrial lead and a Medtronic model 5076 (serial number [id number]) right ventricular lead were advanced with fluoroscopic visualization into the right atrial appendage and right ventricular apex positions respectively.  Initial atrial lead P- waves measured 2.93mV with impedance of 789 ohms and a threshold of 1  V at 0.5 msec.  Right ventricular lead R-waves measured 12 mV with an impedance of 722 ohms and a threshold of 0.75 V at 0.5 msec.  Both leads were secured to the pectoralis fascia using #2-0 silk over the suture sleeves. Device Placement:  The leads were then connected to a Medtronic Azure XT DR MRI SureScan (serial number [id number]) pacemaker.  The pocket was irrigated with copious gentamicin solution.  The pacemaker was then placed into the pocket.  The pocket was then closed in 3 layers with 2.0 Vicryl suture for the subcutaneous and 3.0 Vicryl suture subcuticular layers.  Steri-Strips and a sterile dressing were then applied. EBL<72ml. There were no early apparent complications.   CONCLUSIONS:  1. Successful implantation of a Medtronic Azure XT DR MRI SureScan dual-chamber pacemaker for symptomatic bradycardia  2. No early apparent complications.       Will Meredith Leeds, MD 08/22/2019 3:36 PM  ECHOCARDIOGRAM COMPLETE  Result Date: 08/21/2019    ECHOCARDIOGRAM REPORT   Patient Name:   KINSIE BELFORD Date of Exam: 08/21/2019 Medical Rec #:  [id number]        Height:       59.0 in Accession #:    [id number]       Weight:       174.6 lb Date of Birth:  Apr 30, 1930         BSA:          1.741 m Patient Age:    22 years         BP:           141/55 mmHg Patient Gender: F                HR:           72 bpm. Exam Location:  Inpatient Procedure: 2D Echo, 3D Echo, Cardiac Doppler and Color Doppler Indications:    R07.9* Chest pain, unspecified   History:        Patient has prior history of Echocardiogram examinations, most                 recent 07/17/2013. CAD, Angina and Previous Myocardial Infarction,                 Prior CABG and Abnormal ECG, Mitral Valve Disease,                 Arrythmias:Atrial Fibrillation, Signs/Symptoms:Dyspnea; Risk                 Factors:Former Smoker, Hypertension and Dyslipidemia. Mitral                 valve repair.  Sonographer:    Roseanna Rainbow RDCS Referring Phys: [id number] [place]  1. Hypokinesis of the basal inferior and inferolateral wall with overall preserved LV function; mild LVH; s/p MV repair with mild MS (mean gradient 5 mmHg) and mild MR; mild LAE; mild RV dysfunction.  2. Left ventricular ejection fraction, by estimation, is 55 to 60%. The left ventricle has normal function. The left ventricle demonstrates regional wall motion abnormalities (see scoring diagram/findings for description). There is mild left ventricular  hypertrophy. Left ventricular diastolic parameters are indeterminate.  3. Right ventricular systolic function is mildly reduced. The right ventricular size is normal. There is normal pulmonary artery systolic pressure.  4. Left atrial size was mildly dilated.  5. The mitral valve has been repaired/replaced. Mild mitral valve regurgitation. Mild mitral stenosis.  6. The  aortic valve is tricuspid. Aortic valve regurgitation is not visualized. Mild to moderate aortic valve sclerosis/calcification is present, without any evidence of aortic stenosis.  7. The inferior vena cava is normal in size with greater than 50% respiratory variability, suggesting right atrial pressure of 3 mmHg. FINDINGS  Left Ventricle: Left ventricular ejection fraction, by estimation, is 55 to 60%. The left ventricle has normal function. The left ventricle demonstrates regional wall motion abnormalities. The left ventricular internal cavity size was normal in size. There is mild left ventricular hypertrophy.  Left ventricular diastolic parameters are indeterminate. Right Ventricle: The right ventricular size is normal.Right ventricular systolic function is mildly reduced. There is normal pulmonary artery systolic pressure. The tricuspid regurgitant velocity is 2.58 m/s, and with an assumed right atrial pressure of 8 mmHg, the estimated right ventricular systolic pressure is 29.9 mmHg. Left Atrium: Left atrial size was mildly dilated. Right Atrium: Right atrial size was normal in size. Pericardium: There is no evidence of pericardial effusion. Mitral Valve: The mitral valve has been repaired/replaced. Normal mobility of the mitral valve leaflets. Mild mitral valve regurgitation. Mild mitral valve stenosis. MV peak gradient, 16.2 mmHg. The mean mitral valve gradient is 5.0 mmHg. Tricuspid Valve: The tricuspid valve is normal in structure. Tricuspid valve regurgitation is mild . No evidence of tricuspid stenosis. Aortic Valve: The aortic valve is tricuspid. Aortic valve regurgitation is not visualized. Mild to moderate aortic valve sclerosis/calcification is present, without any evidence of aortic stenosis. Aortic valve mean gradient measures 5.0 mmHg. Aortic valve peak gradient measures 10.0 mmHg. Aortic valve area, by VTI measures 2.20 cm. Pulmonic Valve: The pulmonic valve was normal in structure. Pulmonic valve regurgitation is not visualized. No evidence of pulmonic stenosis. Aorta: The aortic root is normal in size and structure. Venous: The inferior vena cava is normal in size with greater than 50% respiratory variability, suggesting right atrial pressure of 3 mmHg.  Additional Comments: Hypokinesis of the basal inferior and inferolateral wall with overall preserved LV function; mild LVH; s/p MV repair with mild MS (mean gradient 5 mmHg) and mild MR; mild LAE; mild RV dysfunction.  LEFT VENTRICLE PLAX 2D LVIDd:         4.70 cm      Diastology LVIDs:         3.60 cm      LV e' lateral:   4.35 cm/s LV PW:         1.10  cm      LV E/e' lateral: 22.9 LV IVS:        1.40 cm      LV e' medial:    4.95 cm/s LVOT diam:     2.10 cm      LV E/e' medial:  20.1 LV SV:         72 LV SV Index:   42 LVOT Area:     3.46 cm  LV Volumes (MOD) LV vol d, MOD A2C: 118.0 ml LV vol d, MOD A4C: 118.0 ml LV vol s, MOD A2C: 62.0 ml LV vol s, MOD A4C: 48.0 ml LV SV MOD A2C:     56.0 ml LV SV MOD A4C:     118.0 ml LV SV MOD BP:      67.5 ml IVC IVC diam: 2.10 cm LEFT ATRIUM             Index       RIGHT ATRIUM           Index LA  diam:        2.70 cm 1.55 cm/m  RA Area:     19.10 cm LA Vol (A2C):   70.1 ml 40.27 ml/m RA Volume:   56.90 ml  32.68 ml/m LA Vol (A4C):   54.6 ml 31.36 ml/m LA Biplane Vol: 59.0 ml 33.89 ml/m  AORTIC VALVE AV Area (Vmax):    1.99 cm AV Area (Vmean):   1.97 cm AV Area (VTI):     2.20 cm AV Vmax:           158.00 cm/s AV Vmean:          109.000 cm/s AV VTI:            0.329 m AV Peak Grad:      10.0 mmHg AV Mean Grad:      5.0 mmHg LVOT Vmax:         90.90 cm/s LVOT Vmean:        61.900 cm/s LVOT VTI:          0.209 m LVOT/AV VTI ratio: 0.64  AORTA Ao Root diam: 3.40 cm Ao Asc diam:  3.70 cm MITRAL VALVE                TRICUSPID VALVE MV Area (PHT): 2.10 cm     TR Peak grad:   26.6 mmHg MV Peak grad:  16.2 mmHg    TR Vmax:        258.00 cm/s MV Mean grad:  5.0 mmHg MV Vmax:       2.02 m/s     SHUNTS MV Vmean:      95.8 cm/s    Systemic VTI:  0.21 m MV Decel Time: 361 msec     Systemic Diam: 2.10 cm MV E velocity: 99.63 cm/s MV A velocity: 108.00 cm/s MV E/A ratio:  0.92 Kirk Ruths MD Electronically signed by Kirk Ruths MD Signature Date/Time: 08/21/2019/10:14:39 AM    Final         Scheduled Meds: . amLODipine  2.5 mg Oral Daily  . cholecalciferol  1,000 Units Oral Daily  . ezetimibe  10 mg Oral Daily  . levothyroxine  50 mcg Oral Daily  . sodium chloride flush  3 mL Intravenous Q12H   Continuous Infusions:   LOS: 0 days    Time spent: 35 mins.  Bonnell Public, MD [facility] P8/06/2019, 6:17 PM

## 2019-08-22 NOTE — Progress Notes (Signed)
Progress Note  Patient Name: Heather [name] Date of Encounter: 08/22/2019  [facility] Cardiologist: Shirlee More, MD   Subjective   Feels well outside her hip pain, no new complaints  Inpatient Medications    Scheduled Meds: . amLODipine  2.5 mg Oral Daily  . chlorhexidine  60 mL Topical Once  . chlorhexidine  60 mL Topical Once  . cholecalciferol  1,000 Units Oral Daily  . ezetimibe  10 mg Oral Daily  . gentamicin irrigation  80 mg Irrigation On Call  . levothyroxine  50 mcg Oral Daily  . sodium chloride flush  3 mL Intravenous Q12H   Continuous Infusions: . sodium chloride    . sodium chloride    .  ceFAZolin (ANCEF) IV     PRN Meds: acetaminophen, famotidine, nitroGLYCERIN, sodium chloride flush   Vital Signs    Vitals:   08/21/19 1555 08/21/19 1627 08/22/19 0027 08/22/19 0432  BP:  132/61 (!) 168/83 (!) 121/48  Pulse: 81 82 90 62  Resp: 19 18 18 17   Temp:  98.3 F (36.8 C) 98.2 F (36.8 C) 97.8 F (36.6 C)  TempSrc:  Oral Oral Oral  SpO2: 96% 100% 100% 97%  Weight:    78.5 kg  Height:        Intake/Output Summary (Last 24 hours) at 08/22/2019 0802 Last data filed at 08/21/2019 2251 Gross per 24 hour  Intake 243 ml  Output --  Net 243 ml   Last 3 Weights 08/22/2019 08/21/2019 08/20/2019  Weight (lbs) 173 lb 1.6 oz 174 lb 9.6 oz 174 lb 9.6 oz  Weight (kg) 78.518 kg 79.198 kg 79.198 kg      Telemetry    SR 70's, intermittent/transient junctional rhythm 40's, pauses on/off, not as long as yesterday  - Personally Reviewed  ECG    No new EKGs - Personally Reviewed  Physical Exam   GEN: No acute distress.   Neck: No JVD Cardiac: RRR, no murmurs, rubs, or gallops.  Respiratory: CTA b/l. GI: Soft, nontender, non-distended  MS: No edema; No deformity. Neuro:  Nonfocal  Psych: Normal affect   Labs    High Sensitivity Troponin:   Recent Labs  Lab 08/20/19 1720 08/20/19 1950 08/20/19 2244  TROPONINIHS 9 10 8       Chemistry Recent Labs   Lab 08/20/19 1720  NA 137  K 4.0  CL 104  CO2 24  GLUCOSE 95  BUN 10  CREATININE 0.83  CALCIUM 9.6  GFRNONAA >60  GFRAA >60  ANIONGAP 9     Hematology Recent Labs  Lab 08/20/19 1720  WBC 6.0  RBC 3.89  HGB 11.3*  HCT 34.2*  MCV 87.9  MCH 29.0  MCHC 33.0  RDW 13.6  PLT 207    BNP Recent Labs  Lab 08/20/19 1720  BNP 146.8*     DDimer No results for input(s): DDIMER in the last 168 hours.   Radiology      Cardiac Studies   Echo 08/21/19 1. Hypokinesis of the basal inferior and inferolateral wall with overall  preserved LV function; mild LVH; s/p MV repair with mild MS (mean gradient  5 mmHg) and mild MR; mild LAE; mild RV dysfunction.  2. Left ventricular ejection fraction, by estimation, is 55 to 60%. The  left ventricle has normal function. The left ventricle demonstrates  regional wall motion abnormalities (see scoring diagram/findings for  description). There is mild left ventricular  hypertrophy. Left ventricular diastolic parameters are indeterminate.  3.  Right ventricular systolic function is mildly reduced. The right  ventricular size is normal. There is normal pulmonary artery systolic  pressure.  4. Left atrial size was mildly dilated.  5. The mitral valve has been repaired/replaced. Mild mitral valve  regurgitation. Mild mitral stenosis.  6. The aortic valve is tricuspid. Aortic valve regurgitation is not  visualized. Mild to moderate aortic valve sclerosis/calcification is  present, without any evidence of aortic stenosis.  7. The inferior vena cava is normal in size with greater than 50%  respiratory variability, suggesting right atrial pressure of 3 mmHg.   Cardiac cath 51/8841  LV end diastolic pressure is mildly elevated.  The left ventricular ejection fraction is 50-55% by visual estimate.  There is no aortic valve stenosis.  Mid RCA lesion, 100 %stenosed.  Dist RCA lesion, 100 %stenosed.  Prox Cx lesion, 100  %stenosed.  Mid LAD to Dist LAD lesion, 90 %stenosed.  Stents in SVG to RCA are widely patent.  SVG to OM is occluded.  LIMA to LAD is patent.  No change from end result of cath in April 2017. Widely patent stents in the SVG to RCA. Lateral wall defect on nuclear is likely due to occluded circumflex and occluded SVG to OM. She has faint collaterals to her lateral wall. Continue medical therapy.   Patient Profile     86 y.o. female with a hx of CAD (CABG 2003), VHD s/p MV repair (2003), HTN, HLD, hypothyroidism (s/p thyroidectomy), AFib (this history is unclear and she is not on a/c), diastolic CHF, admitted with atypical CP and recurrent dizzy spells and near syncope  Assessment & Plan    1. Dizzy spells, near syncope     Significant baseline conduction system disease     Sinus pauses 2-4 seconds while awake have been observed     Intermittent junctional rhythm as well  Planned for PPM today Dr. Quentin Ore has seen and examined the patient, she remains agreeable to proceed with PPM.  2. CP 3. CAD     Evaluated by cardiology, felt to be atypiccal     Neg HS Trop     Preserved LVEF     No plans for ischemic eval at this time  4. MV repair     Echo noted above  5. HTN     Looks OK, no changes today    For questions or updates, please contact [place] Please consult [email or web address] for contact info under        Signed, Baldwin Jamaica, PA-C  08/22/2019, 8:02 AM

## 2019-08-23 ENCOUNTER — Inpatient Hospital Stay (HOSPITAL_COMMUNITY): Payer: Medicare Other

## 2019-08-23 DIAGNOSIS — I495 Sick sinus syndrome: Secondary | ICD-10-CM | POA: Diagnosis not present

## 2019-08-23 NOTE — Progress Notes (Signed)
   Progress Note   Subjective   Doing well today, the patient denies CP.  She reports that her breathing is better than it has been in some time.  No new concerns  Inpatient Medications    Scheduled Meds: . amLODipine  2.5 mg Oral Daily  . cholecalciferol  1,000 Units Oral Daily  . ezetimibe  10 mg Oral Daily  . levothyroxine  50 mcg Oral Daily  . sodium chloride flush  3 mL Intravenous Q12H   Continuous Infusions:  PRN Meds: acetaminophen, famotidine, nitroGLYCERIN   Vital Signs    Vitals:   08/22/19 1735 08/22/19 1936 08/23/19 0036 08/23/19 0457  BP: 107/75  (!) 117/50 (!) 131/53  Pulse: 74   60  Resp: (!) 23   17  Temp:  98.6 F (37 C) 98 F (36.7 C) 98.1 F (36.7 C)  TempSrc:  Oral Oral Oral  SpO2: 100%   97%  Weight:    78.5 kg  Height:        Intake/Output Summary (Last 24 hours) at 08/23/2019 1059 Last data filed at 08/23/2019 0515 Gross per 24 hour  Intake 240 ml  Output 350 ml  Net -110 ml   Filed Weights   08/21/19 0425 08/22/19 0432 08/23/19 0457  Weight: 79.2 kg 78.5 kg 78.5 kg    Telemetry    sinus - Personally Reviewed  Physical Exam   GEN- The patient is elderly appearing, alert and oriented x 3 today.   Head- normocephalic, atraumatic Eyes-  Sclera clear, conjunctiva pink Ears- hearing intact Oropharynx- clear Neck- supple, Lungs-  normal work of breathing Heart- Regular rate and rhythm  GI- soft  Extremities- no clubbing, cyanosis, or edema  MS- diffuse atrophy Skin- pacemaker pocket without hematoma   Labs    Chemistry Recent Labs  Lab 08/20/19 1720  NA 137  K 4.0  CL 104  CO2 24  GLUCOSE 95  BUN 10  CREATININE 0.83  CALCIUM 9.6  GFRNONAA >60  GFRAA >60  ANIONGAP 9     Hematology Recent Labs  Lab 08/20/19 1720  WBC 6.0  RBC 3.89  HGB 11.3*  HCT 34.2*  MCV 87.9  MCH 29.0  MCHC 33.0  RDW 13.6  PLT 207     Patient ID  86 y.o. female with a hx of CAD (CABG 2003), VHD s/p MV repair (2003), HTN, HLD,  hypothyroidism (s/p thyroidectomy), AFib (this history is unclear and she is not on a/c), diastolic CHF, admitted with atypical CP and recurrent dizzy spells and near syncope  Assessment & Plan    1.  Sick sinus syndrome Doing well s/p PPM Device interrogation is today is personally reviewed and shows normal device function CXR is pending. Pressure dressing and sling removed  2. CAD Stable Ok to resume ASA and plavix (home regimen) tomorrow No further inpatient workup planned  3. HTN Stable No change required today  4. MV repair  Stable No change required today  Up ad lib today Anticipate discharge to home tomorrow  No further inpatient EP or cardiology management planned.  Routine wound care and follow-up  Thompson Grayer MD, [facility] 08/23/2019 10:59 AM

## 2019-08-23 NOTE — Progress Notes (Signed)
PROGRESS NOTE    Heather [name]  QPR:[id number] DOB: 1931-01-05 DOA: 08/20/2019 PCP: Leonard Downing, MD   Brief Narrative:  Patient is 86 year old female with history of multivessel coronary artery disease, status post CABG and PCI, history of mitral valve repair,chronic diastolic CHF, GERD, hypertension, hyperlipidemia with history of statin intolerance, hypothyroidism, paroxysmal atrial fibrillation not on anticoagulation presenting to the ED via EMS with complaints of chest pain.  She received aspirin 324 mg and 1 nitro prior to ED arrival.  EMS reported that patient was not orthostatic but became dizzy and went into A. fib while standing.Patient reported having exertional chest tightness and associated pain between her shoulder blades for the past few weeks.  After admission, she remained hemodynamically stable.  Her chest pain/tightness has resolved.  She was found to have 4-second pause on telemetry.  Cardiology following and consulting EP.  08/22/2019: Patient seen alongside patient's son.  Patient has undergone dual chamber permanent pacemaker placement today.  Input from the cardiology team is highly appreciated.  Cardiology is directing care.  No further syncope.  No chest pain.  No other constitutional symptoms reported.  08/23/2019: Patient seen.  Reports pain at the base of left thumb.  X-ray did not reveal any fractures.  Electrophysiology input is appreciated.  Patient will be monitored overnight.  Likely discharge back on tomorrow.  Heart rate is 87 bpm today.  Assessment & Plan:   Principal Problem:   Chest pain Active Problems:   CAD (coronary artery disease) of artery bypass graft, s/p PCI stent to VG->RCA 04/30/15   Hyperlipidemia   Hypothyroidism   PAF (paroxysmal atrial fibrillation) (HCC)   Sinus node dysfunction (HCC)   Chest pain/discomfort: Has history of significant coronary artery disease.  Troponins have been negative.  EKG did not show any ischemic changes.   Currently she is chest pain-free.  Cardiology not recommending cardiac cath at this point.  Continue home aspirin, Plavix, Zetia.  Echo has been ordered. 08/23/2019: Resolved.  Marland Kitchen   Heart block/pause: Telemetry notified about 4-second pause while being awake.  She is asymptomatic.  She had a near syncope episode yesterday.  Cardiology recommended EP evaluation for possible pacemaker placement.  Monitor on telemetry.  Avoid AV nodal blocking agents. 08/22/2019: Kindly see above.  Pacemaker has been placed. 08/23/2019: Heart rate is stable.  GERD: Continue Pepcid  Hypertension: Continue current medications.  Continue to monitor blood pressure 07/06/2019: Continue Norvasc 2.5 Mg p.o. once daily.  Can increase dosage as deemed necessary.  Hyperlipidemia: On Zetia  Hypothyroidism: On Synthyroid  Chronic diastolic congestive heart failure: Currently euvolemic.  Takes Lasix at home.  Mild elevated BNP.     DVT prophylaxis:Lovenox Code Status: DNR Family Communication: Wife and daughter at the bedside Status is: Observation  The patient remains OBS appropriate and will d/c before 2 midnights.  Dispo: The patient is from: Home              Anticipated d/c is to: Home              Anticipated d/c date is: 1 day              Patient currently is not medically stable to d/c. Waiting from cardiology clearance before discharge.  EP should see her as per cardiology     Consultants: Cardiology  Procedures:None  Antimicrobials:  Anti-infectives (From admission, onward)   Start     Dose/Rate Route Frequency Ordered Stop   08/21/19 1730  gentamicin (GARAMYCIN)  80 mg in sodium chloride 0.9 % 500 mL irrigation        80 mg Irrigation On call 08/21/19 1726 08/22/19 1541   08/21/19 1730  ceFAZolin (ANCEF) IVPB 2g/100 mL premix        2 g 200 mL/hr over 30 Minutes Intravenous On call 08/21/19 1726 08/22/19 1541      Subjective: No complaints. No chest pain. No shortness of  breath.  Objective: Vitals:   08/22/19 1936 08/23/19 0036 08/23/19 0457 08/23/19 1807  BP:  (!) 117/50 (!) 131/53 (!) 153/67  Pulse:   60 94  Resp:   17 20  Temp: 98.6 F (37 C) 98 F (36.7 C) 98.1 F (36.7 C)   TempSrc: Oral Oral Oral Oral  SpO2:   97% 98%  Weight:   78.5 kg   Height:        Intake/Output Summary (Last 24 hours) at 08/23/2019 1911 Last data filed at 08/23/2019 1020 Gross per 24 hour  Intake 240 ml  Output 750 ml  Net -510 ml   Filed Weights   08/21/19 0425 08/22/19 0432 08/23/19 0457  Weight: 79.2 kg 78.5 kg 78.5 kg    Examination:  General exam: Patient is not in any distress.  Patient is awake and alert.  Left upper extremity is supported.   HEENT: Mild pallor.  No jaundice.  Respiratory system: Clear to auscultation.   Cardiovascular system: S1 & S2  Gastrointestinal system: Soft and nontender.  Organs were not palpable.   Central nervous system: Alert and oriented.  Patient moves all extremities.   Extremities: No edema,  Data Reviewed: I have personally reviewed following labs and imaging studies  CBC: Recent Labs  Lab 08/20/19 1720  WBC 6.0  NEUTROABS 4.0  HGB 11.3*  HCT 34.2*  MCV 87.9  PLT 546   Basic Metabolic Panel: Recent Labs  Lab 08/20/19 1720 08/21/19 0025 08/21/19 1215  NA 137  --   --   K 4.0  --   --   CL 104  --   --   CO2 24  --   --   GLUCOSE 95  --   --   BUN 10  --   --   CREATININE 0.83  --   --   CALCIUM 9.6  --   --   MG  --  2.0 2.0   GFR: Estimated Creatinine Clearance: 43.2 mL/min (by C-G formula based on SCr of 0.83 mg/dL). Liver Function Tests: No results for input(s): AST, ALT, ALKPHOS, BILITOT, PROT, ALBUMIN in the last 168 hours. No results for input(s): LIPASE, AMYLASE in the last 168 hours. No results for input(s): AMMONIA in the last 168 hours. Coagulation Profile: Recent Labs  Lab 08/20/19 1720  INR 1.1   Cardiac Enzymes: No results for input(s): CKTOTAL, CKMB, CKMBINDEX, TROPONINI in  the last 168 hours. BNP (last 3 results) No results for input(s): PROBNP in the last 8760 hours. HbA1C: No results for input(s): HGBA1C in the last 72 hours. CBG: No results for input(s): GLUCAP in the last 168 hours. Lipid Profile: No results for input(s): CHOL, HDL, LDLCALC, TRIG, CHOLHDL, LDLDIRECT in the last 72 hours. Thyroid Function Tests: Recent Labs    08/21/19 1215  TSH 1.453   Anemia Panel: No results for input(s): VITAMINB12, FOLATE, FERRITIN, TIBC, IRON, RETICCTPCT in the last 72 hours. Sepsis Labs: No results for input(s): PROCALCITON, LATICACIDVEN in the last 168 hours.  Recent Results (from the past 240 hour(s))  SARS Coronavirus 2 by RT PCR (hospital order, performed in [facility] hospital lab) Nasopharyngeal Nasopharyngeal Swab     Status: None   Collection Time: 08/20/19  5:20 PM   Specimen: Nasopharyngeal Swab  Result Value Ref Range Status   SARS Coronavirus 2 NEGATIVE NEGATIVE Final    Comment: (NOTE) SARS-CoV-2 target nucleic acids are NOT DETECTED.  The SARS-CoV-2 RNA is generally detectable in upper and lower respiratory specimens during the acute phase of infection. The lowest concentration of SARS-CoV-2 viral copies this assay can detect is 250 copies / mL. A negative result does not preclude SARS-CoV-2 infection and should not be used as the sole basis for treatment or other patient management decisions.  A negative result may occur with improper specimen collection / handling, submission of specimen other than nasopharyngeal swab, presence of viral mutation(s) within the areas targeted by this assay, and inadequate number of viral copies (<250 copies / mL). A negative result must be combined with clinical observations, patient history, and epidemiological information.  Fact Sheet for Patients:   StrictlyIdeas.no  Fact Sheet for Healthcare Providers: BankingDealers.co.za  This test is not yet  approved or  cleared by the [place] FDA and has been authorized for detection and/or diagnosis of SARS-CoV-2 by FDA under an Emergency Use Authorization (EUA).  This EUA will remain in effect (meaning this test can be used) for the duration of the COVID-19 declaration under Section 564(b)(1) of the Act, 21 U.S.C. section 360bbb-3(b)(1), unless the authorization is terminated or revoked sooner.  Performed at [facility], [place] [address] [place], [place] [address]   Surgical PCR screen     Status: None   Collection Time: 08/21/19  7:39 PM   Specimen: Nasal Mucosa; Nasal Swab  Result Value Ref Range Status   MRSA, PCR NEGATIVE NEGATIVE Final   Staphylococcus aureus NEGATIVE NEGATIVE Final    Comment: (NOTE) The Xpert SA Assay (FDA approved for NASAL specimens in patients 42 years of age and older), is one component of a comprehensive surveillance program. It is not intended to diagnose infection nor to guide or monitor treatment. Performed at [facility], [place] [address], [place], [place] [address]          Radiology Studies: DG Chest 2 View  Result Date: 08/23/2019 CLINICAL DATA:  Cardiac pacemaker. EXAM: CHEST - 2 VIEW COMPARISON:  08/20/2019 FINDINGS: Median sternotomy and CABG. The LEFT-sided transvenous pacemaker has been placed, leads overlying the RIGHT atrium and RIGHT ventricle. There is no pneumothorax. The heart is enlarged and stable in configuration. No edema. No consolidations or pleural effusions. IMPRESSION: 1. Interval placement of transvenous pacemaker. No pneumothorax. 2. Stable cardiomegaly. Electronically Signed   By: Nolon Nations M.D.   On: 08/23/2019 13:12   EP PPM/ICD IMPLANT  Result Date: 08/22/2019 SURGEON:  Will Meredith Leeds, MD, Lars Mage, MD   PREPROCEDURE DIAGNOSIS:  Sick sinus syndrome   POSTPROCEDURE DIAGNOSIS:  Sick sinus syndrome    PROCEDURES:  1. Left upper extremity venography.   INTRODUCTION: OLEVA KOO is a  86 y.o. female  with a history of bradycardia who presents today for pacemaker implantation.  The patient reports intermittent episodes of dizziness over the past few months.  No reversible causes have been identified.  The patient therefore presents today for pacemaker implantation.   DESCRIPTION OF PROCEDURE:  Informed written consent was obtained, and  the patient was brought to the electrophysiology lab in a fasting state.  The patient  required no sedation for the procedure today.  The patients left chest was prepped and draped in the usual sterile fashion by the EP lab staff. The skin overlying the left deltopectoral region was infiltrated with lidocaine for local analgesia.  A 4-cm incision was made over the left deltopectoral region.  A left subcutaneous pacemaker pocket was fashioned using a combination of sharp and blunt dissection. Electrocautery was required to assure hemostasis.  RA/RV Lead Placement: The left axillary vein was cannulated.  Through the left axillary vein, a Medtronic model 5076 (serial number [id number]) right atrial lead and a Medtronic model 5076 (serial number [id number]) right ventricular lead were advanced with fluoroscopic visualization into the right atrial appendage and right ventricular apex positions respectively.  Initial atrial lead P- waves measured 2.57mV with impedance of 789 ohms and a threshold of 1 V at 0.5 msec.  Right ventricular lead R-waves measured 12 mV with an impedance of 722 ohms and a threshold of 0.75 V at 0.5 msec.  Both leads were secured to the pectoralis fascia using #2-0 silk over the suture sleeves. Device Placement:  The leads were then connected to a Medtronic Azure XT DR MRI SureScan (serial number [id number]) pacemaker.  The pocket was irrigated with copious gentamicin solution.  The pacemaker was then placed into the pocket.  The pocket was then closed in 3 layers with 2.0 Vicryl suture for the subcutaneous and 3.0 Vicryl suture subcuticular  layers.  Steri-Strips and a sterile dressing were then applied. EBL<41ml. There were no early apparent complications.   CONCLUSIONS:  1. Successful implantation of a Medtronic Azure XT DR MRI SureScan dual-chamber pacemaker for symptomatic bradycardia  2. No early apparent complications.       Will Meredith Leeds, MD 08/22/2019 3:36 PM  DG Finger Thumb Left  Result Date: 08/23/2019 CLINICAL DATA:  Left thumb pain, swelling at 1st MCP joint. EXAM: LEFT THUMB 2+V COMPARISON:  None. FINDINGS: Advanced degenerative changes of the 1st carpometacarpal joint and throughout the left wrist. Joint space narrowing and spurring in the left thumb IP joint. No acute bony abnormality. Specifically, no fracture, subluxation, or dislocation. IMPRESSION: Degenerative changes as above.  No acute bony abnormality. Electronically Signed   By: Rolm Baptise M.D.   On: 08/23/2019 16:50        Scheduled Meds: . amLODipine  2.5 mg Oral Daily  . cholecalciferol  1,000 Units Oral Daily  . ezetimibe  10 mg Oral Daily  . levothyroxine  50 mcg Oral Daily  . sodium chloride flush  3 mL Intravenous Q12H   Continuous Infusions:   LOS: 1 day    Time spent: 25 mins.  Bonnell Public, MD [facility] P8/07/2019, 7:11 PM

## 2019-08-24 NOTE — Progress Notes (Signed)
Doing well from EP standpoint.  Ok to discharge with routine wound care and follow-up (already arranged)  Electrophysiology team to see as needed while here. Please call with questions.   Thompson Grayer MD, [facility] 08/24/2019 11:14 AM

## 2019-08-24 NOTE — Discharge Summary (Signed)
Physician Discharge Summary  Patient ID: Heather [name] MRN: [id number] DOB/AGE: 1930/12/25 86 y.o.  Admit date: 08/20/2019 Discharge date: 08/24/2019  Admission Diagnoses:  Discharge Diagnoses:  Principal Problem:   Chest pain Active Problems:   CAD (coronary artery disease) of artery bypass graft, s/p PCI stent to VG->RCA 04/30/15   Hyperlipidemia   Hypothyroidism   PAF (paroxysmal atrial fibrillation) (HCC)   Sinus node dysfunction ([place])   Discharged Condition: Stable.  Hospital Course:  Patient is an 86 year old female with past medical history significant for multivessel coronary artery disease, status post CABG and PCI; history of mitral valve repair,chronic diastolic CHF, GERD, hypertension, hyperlipidemia with history of statin intolerance, hypothyroidism, paroxysmal atrial fibrillation not on anticoagulation presenting to the ED via EMS with complaints of chest pain. Patient received aspirin 324 mg and 1 nitro prior to ED arrival. EMS reported that patient was not orthostatic but became dizzy and went into A. fib while standing.  Patient reported having exertional chest tightness and associated pain between her shoulder blades for the past few weeks.  Patient remained hemodynamically stable, chest pain/tightness resolved, however, patient was found to have 4-second pause on telemetry.  Cardiology and Electrophysiology teams were consulted to assist with patient's care.  Patient underwent dual chamber permanent pacemaker placement.  Patient remained stable afterwards with no further pauses or syncope.  Cardiology and Electrophysiology teams have cleared patient for discharge.  Consults: Cardiology and Electrophysiology   Discharge Exam: Blood pressure (!) 135/57, pulse 78, temperature 98.9 F (37.2 C), temperature source Oral, resp. rate 20, height 4\' 11"  (1.499 m), weight 78.6 kg, SpO2 100 %.  Disposition: Discharge disposition: 01-Home or Self Care  Discharge Instructions     Diet - low sodium heart healthy   Complete by: As directed    Increase activity slowly   Complete by: As directed    No wound care   Complete by: As directed      Allergies as of 08/24/2019      Reactions   Atorvastatin    Muscle ache   Codeine Nausea And Vomiting   Crestor [rosuvastatin]    Muscle ache   Pravastatin    Muscle ache   Simvastatin    Muscle cramps   Ticagrelor    Dyspnea      Medication List    STOP taking these medications   furosemide 20 MG tablet Commonly known as: LASIX   Magnesium 250 MG Tabs     TAKE these medications   acetaminophen 500 MG tablet Commonly known as: TYLENOL Take 500 mg by mouth every 6 (six) hours as needed for mild pain or moderate pain.   amLODipine 2.5 MG tablet Commonly known as: NORVASC TAKE 1 TABLET BY MOUTH ONCE DAILY NEEDS  APPOINTMENT  WITH  CARDIOLOGIST What changed: See the new instructions.   aspirin EC 81 MG tablet Take 81 mg by mouth daily.   cholecalciferol 25 MCG (1000 UNIT) tablet Commonly known as: VITAMIN D3 Take 1,000 Units by mouth daily.   clopidogrel 75 MG tablet Commonly known as: PLAVIX Take 1 tablet (75 mg total) by mouth daily.   ezetimibe 10 MG tablet Commonly known as: ZETIA Take 1 tablet (10 mg total) by mouth daily.   famotidine 10 MG tablet Commonly known as: PEPCID Take 10 mg by mouth daily as needed for heartburn or indigestion.   levothyroxine 50 MCG tablet Commonly known as: SYNTHROID Take 50 mcg by mouth daily.   nitroGLYCERIN 0.4 MG SL tablet Commonly  known as: NITROSTAT Place 0.4 mg under the tongue every 5 (five) minutes as needed for chest pain.       Follow-up Information    [facility].   Specialty: Cardiology Why: 09/04/2019 @ 2:30PM, wound check visit Contact information: [address], [address] [place]       Vickie Epley, MD Follow up.   Specialty: Cardiology Why:  11/26/2019 @ 2:50PM Contact information: [address] [place] [address] [phone]               Signed: Bonnell Public 08/24/2019, 12:48 PM

## 2019-08-25 ENCOUNTER — Encounter (HOSPITAL_COMMUNITY): Payer: Self-pay | Admitting: Cardiology

## 2019-08-25 ENCOUNTER — Telehealth: Payer: Self-pay

## 2019-08-25 NOTE — Telephone Encounter (Signed)
The pt called about her monitor. I let her know when the lights are off the monitor lights will go off. If the lights are on and the sun light is out the light will be on the monitor. I tried to get her to send a manual transmission with her monitor to make sure it is paired with her device but was unsuccessful. I called [employer]onic tech support to get additional help.  After being on the line with [employer] for over an hour, [employer] is sending the pt a new monitor. She should receive it in 7-10 business days. I told her to plug the new monitor in for 24 hours upon receiving it, then give me a call to for I can help her send the transmission.

## 2019-08-27 ENCOUNTER — Ambulatory Visit: Payer: Medicare Other | Admitting: Specialist

## 2019-08-27 ENCOUNTER — Telehealth: Payer: Self-pay

## 2019-08-27 NOTE — Telephone Encounter (Signed)
The pt states she do not feel right. The pt is having SOB and feels like something is pulling. I let her speak with the device nurse Raquel Sarna, rn.

## 2019-08-27 NOTE — Telephone Encounter (Signed)
Pt reports increased SOB since yesterday. Feels she is retaining fluid, unsure of weight. Furosemide 20mg  d/c at hospital discharge (admitted 8/4-8/8 for chest pain and AV block). Urine is dark, she is unsure if it is malodorous. Abdomen bloated, watery diarrhea with loss of bowel control yesterday. States she has been "drinking water like crazy but not eliminating much." Chest tightness when talking and taking deep breaths. Admits she is SOB with conversation at rest, SOB to the point that she is unable to talk when walking. Current BP 147/85, HR 89. Unable to obtain PPM transmission as pt is awaiting new monitor. Last BNP 146.8 on 08/20/19.   Advised pt she should seek emergency medical attention at [facility] ED. Pt declines, states she believes she will feel better if she can take a furosemide. She is adamant that she does not want to go to the ED due to prolonged wait times and risk of exposure to COVID. Pt is agreeable to awaiting any recommendations from Dr. Quentin Ore. Advised will call back after discussing with MD.

## 2019-08-27 NOTE — Telephone Encounter (Signed)
Discussed with Dr. Quentin Ore, who advised pt be assessed in ED. Spoke with pt. She states her son is on his way over and is aware of the situation. She again verbalizes understanding of recommendations, but states "as long as I can make decisions for myself, I'm not going to sit in that ER for 14-15 hours. I'm just not going to do it." Offered to call pt's son, but pt declines. Pt is agreeable to calling 911 if symptoms worsen further. Pt verbalized appreciation of time spent on call.  Routed to Dr. Quentin Ore and Sonia Baller, [place], as Juluis Rainier.

## 2019-09-01 NOTE — Telephone Encounter (Signed)
Patient reports she has no Chest pressure, CP, SOB, dizziness or syncope today. She reports that she increased her fluid intake over the past 3 days and she had increased urination which caused he s/sx from 08/29/19. She reports her urine is no longer dark and is "clear yellow".Patient will bring her monitor in 09/04/19 for her wound check for assessment. Patient is still not on Lasix which was not on her discharge medication list. Will forward to cardiology for further direction on resuming Lasix.

## 2019-09-01 NOTE — Telephone Encounter (Signed)
The pt called to get help sending a transmission. [employer] do not have extra [id number] in stock and asked if we had any in stock in the clinic. I told them no but let me go ask to be sure. I asked Sharee Pimple the [employer] rep and she spoke with the [employer] rep and patient. She is going to find the pt a monitor and call to follow up with the pt.

## 2019-09-04 ENCOUNTER — Other Ambulatory Visit: Payer: Self-pay

## 2019-09-04 ENCOUNTER — Ambulatory Visit (INDEPENDENT_AMBULATORY_CARE_PROVIDER_SITE_OTHER): Payer: Medicare Other | Admitting: Emergency Medicine

## 2019-09-04 DIAGNOSIS — Z950 Presence of cardiac pacemaker: Secondary | ICD-10-CM

## 2019-09-04 DIAGNOSIS — I495 Sick sinus syndrome: Secondary | ICD-10-CM | POA: Diagnosis not present

## 2019-09-04 LAB — CUP PACEART INCLINIC DEVICE CHECK
Battery Remaining Longevity: 179 mo
Battery Voltage: 3.22 V
Brady Statistic AP VP Percent: 0.04 %
Brady Statistic AP VS Percent: 9.14 %
Brady Statistic AS VP Percent: 0.09 %
Brady Statistic AS VS Percent: 90.71 %
Brady Statistic RA Percent Paced: 7.42 %
Brady Statistic RV Percent Paced: 2.95 %
Date Time Interrogation Session: 20210819145200
Implantable Lead Implant Date: 20210806
Implantable Lead Implant Date: 20210806
Implantable Lead Location: 753859
Implantable Lead Location: 753860
Implantable Lead Model: 5076
Implantable Lead Model: 5076
Implantable Pulse Generator Implant Date: 20210806
Lead Channel Impedance Value: 361 Ohm
Lead Channel Impedance Value: 551 Ohm
Lead Channel Impedance Value: 570 Ohm
Lead Channel Impedance Value: 646 Ohm
Lead Channel Pacing Threshold Amplitude: 0.5 V
Lead Channel Pacing Threshold Amplitude: 0.75 V
Lead Channel Pacing Threshold Pulse Width: 0.4 ms
Lead Channel Pacing Threshold Pulse Width: 0.4 ms
Lead Channel Sensing Intrinsic Amplitude: 15.375 mV
Lead Channel Sensing Intrinsic Amplitude: 3.75 mV
Lead Channel Setting Pacing Amplitude: 3.5 V
Lead Channel Setting Pacing Amplitude: 3.5 V
Lead Channel Setting Pacing Pulse Width: 0.4 ms
Lead Channel Setting Sensing Sensitivity: 1.2 mV

## 2019-09-04 NOTE — Telephone Encounter (Signed)
Patient scheduled with Dr. Bettina Gavia on 09/17/19. She is aware and verbalizes understanding. No other issues or concerns were noted at this time.   Encouraged patient to call back with any questions or concerns.

## 2019-09-04 NOTE — Patient Instructions (Signed)
Follow-up with Dr Quentin Ore on 09/15/19.

## 2019-09-05 ENCOUNTER — Telehealth: Payer: Self-pay | Admitting: Emergency Medicine

## 2019-09-05 NOTE — Telephone Encounter (Signed)
Called [employer] tech support to investigate why transmission is not seen from remote sent with patient monitor in the office yesterday. Latoya ([employer]) reports that patient should be contacted to resend transmission due to evidence of completed transmission in system. Called patient, assisted with transmission. Verified transmission received in [facility] system.

## 2019-09-13 NOTE — Progress Notes (Signed)
Electrophysiology Office Note:    Date:  09/15/2019   ID:  Heather [name], DOB 05/07/1930, MRN [id number]  PCP:  Leonard Downing, MD  [facility] Cardiologist:  Shirlee More, MD  [facility] Electrophysiologist:  Vickie Epley, MD   Referring MD: Leonard Downing, *   Chief Complaint: AF, new dx  History of Present Illness:    Heather [name] is a 86 y.o. female with a hx of CAD s/p prior CABG in 2003, MVr in 2003, HTN, hypothyroidism, NSTEMI, and sick sinus syndrome who presents to the clinic to discuss her new AF diagnosis. Device detected 56hour AF episode that corresponded to telephone messages in Epic when she felt terrible and had heart failure symptoms. Her symptoms resolved when she returned to sinus rhythm.  Her PPM site is healing well without pain in the pocket.   Past Medical History:  Diagnosis Date  . Arthritis   . CAD (coronary artery disease), native coronary artery 07/14/2013   Catheterization August/2003 showing severe three-vessel disease and moderate mitral regurgitation 08/28/2001 CABG with internal mammary graft to LAD, vein graft to circumflex, and vein graft to posterolateral branch and mitral valve repair by Dr. 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 Patent internal mammary graft with competitive flow in the distal vessel, occluded right coronary artery, occluded circumflex, patent saphenous vein graft to circumflex with mild/moderate disease proximally, minimal LV damage   . Chronic diastolic CHF (congestive heart failure) (HCC)    a. EF 60% by LV gram 01/2009.  . Coronary artery disease    a. s/p MI in 1980;  b. 2003 CABG x 3 (LIMA->LAD, VG->OM, VG->RPL);  c. 01/2009 Cath: 3/3 patent grafts, native 3VD, EF 60%.  Marland Kitchen GERD (gastroesophageal reflux disease)   . H/O mitral valve repair    a. 2003  . Hyperlipidemia 07/14/2013   Intolerance to multiple  statins including Crestor, pravastatin, and Lipitor   . Hypertension   . Hypothyroidism   . NSTEMI (non-ST elevated myocardial infarction) ([place])   . PAF (paroxysmal atrial fibrillation) ([place])   . S/P partial hysterectomy 1977    Past Surgical History:  Procedure Laterality Date  . ABDOMINAL HYSTERECTOMY  1977  . APPENDECTOMY  1950  . BLADDER SUSPENSION     tac  . CARDIAC CATHETERIZATION  03,05,11  . CARDIAC CATHETERIZATION N/A 04/30/2015   Procedure: Left Heart Cath and Cors/Grafts Angiography;  Surgeon: Belva Crome, MD;  Location: [place] [facility];  Service: Cardiovascular;  Laterality: N/A;  . CARDIAC CATHETERIZATION N/A 04/30/2015   Procedure: Coronary Stent Intervention;  Surgeon: Belva Crome, MD;  Location: [place] [facility];  Service: Cardiovascular;  Laterality: N/A;  Proximal and Distal SVG to the PLA  . CARDIAC CATHETERIZATION N/A 01/06/2016   Procedure: Left Heart Cath and Cors/Grafts Angiography;  Surgeon: Jettie Booze, MD;  Location: [place] [facility];  Service: Cardiovascular;  Laterality: N/A;  . CARPAL TUNNEL RELEASE     right and left  . CHOLECYSTECTOMY    . CORONARY ARTERY BYPASS GRAFT  2003  . KNEE ARTHROSCOPY     left  . LEFT HEART CATHETERIZATION WITH CORONARY ANGIOGRAM N/A 07/12/2013   Procedure: LEFT HEART CATHETERIZATION WITH CORONARY ANGIOGRAM;  Surgeon: Jettie Booze, MD;  Location: [facility] C[facility];  Service: Cardiovascular;  Laterality: N/A;  . MITRAL VALVE REPAIR  2003   with cabg  . PACEMAKER  IMPLANT N/A 08/22/2019   Procedure: PACEMAKER IMPLANT;  Surgeon: Constance Haw, MD;  Location: [place] [facility];  Service: Cardiovascular;  Laterality: N/A;  . THYROIDECTOMY  1968  . THYROIDECTOMY, PARTIAL     left    Current Medications: Current Meds  Medication Sig  . acetaminophen (TYLENOL) 500 MG tablet Take 500 mg by mouth every 6 (six) hours as needed for mild pain or moderate pain.   Marland Kitchen amLODipine (NORVASC) 2.5 MG tablet TAKE 1  TABLET BY MOUTH ONCE DAILY NEEDS  APPOINTMENT  WITH  CARDIOLOGIST (Patient taking differently: Take 2.5 mg by mouth daily. )  . cholecalciferol (VITAMIN D3) 25 MCG (1000 UNIT) tablet Take 1,000 Units by mouth daily.  . clopidogrel (PLAVIX) 75 MG tablet Take 1 tablet (75 mg total) by mouth daily.  . famotidine (PEPCID) 10 MG tablet Take 10 mg by mouth daily as needed for heartburn or indigestion.  Marland Kitchen levothyroxine (SYNTHROID, LEVOTHROID) 50 MCG tablet Take 50 mcg by mouth daily.  . nitroGLYCERIN (NITROSTAT) 0.4 MG SL tablet Place 0.4 mg under the tongue every 5 (five) minutes as needed for chest pain.  . [DISCONTINUED] aspirin EC 81 MG tablet Take 81 mg by mouth daily.     Allergies:   Atorvastatin, Codeine, Crestor [rosuvastatin], Pravastatin, Simvastatin, and Ticagrelor   Social History   Socioeconomic History  . Marital status: Widowed    Spouse name: Not on file  . Number of children: Not on file  . Years of education: Not on file  . Highest education level: Not on file  Occupational History  . Not on file  Tobacco Use  . Smoking status: Former Smoker    Quit date: 06/06/1967    Years since quitting: 52.3  . Smokeless tobacco: Former Network engineer  . Vaping Use: Never used  Substance and Sexual Activity  . Alcohol use: No  . Drug use: No  . Sexual activity: Not Currently  Other Topics Concern  . Not on file  Social History Narrative   Lives in [place].  Widowed.   Social Determinants of Health   Financial Resource Strain:   . Difficulty of Paying Living Expenses: Not on file  Food Insecurity:   . Worried About Charity fundraiser in the Last Year: Not on file  . Ran Out of Food in the Last Year: Not on file  Transportation Needs:   . Lack of Transportation (Medical): Not on file  . Lack of Transportation (Non-Medical): Not on file  Physical Activity:   . Days of Exercise per Week: Not on file  . Minutes of Exercise per Session: Not on file  Stress:   . Feeling  of Stress : Not on file  Social Connections:   . Frequency of Communication with Friends and Family: Not on file  . Frequency of Social Gatherings with Friends and Family: Not on file  . Attends Religious Services: Not on file  . Active Member of Clubs or Organizations: Not on file  . Attends Archivist Meetings: Not on file  . Marital Status: Not on file     Family History: The patient's family history includes Dementia in her mother and sister; Heart Problems in her father, paternal grandfather, and sister; Other in an other family member; Tuberculosis in her maternal grandmother.  ROS:   Please see the history of present illness.    All other systems reviewed and are negative.  EKGs/Labs/Other Studies Reviewed:    The following  studies were reviewed today: ECG, echo, device interrogation  EKG:  The ekg ordered today demonstrates sinus with 1st degree AV delay and RBBB.  Pacemaker interrogation: personally reviewed. 21.4% AP. 56 hour AF episode. Capture, sensing thresholds excellent. Impedances stable. AAIR>DDDR 60-110.  08/21/2019 Echo 1. Hypokinesis of the basal inferior and inferolateral wall with overall  preserved LV function; mild LVH; s/p MV repair with mild MS (mean gradient  5 mmHg) and mild MR; mild LAE; mild RV dysfunction.  2. Left ventricular ejection fraction, by estimation, is 55 to 60%. The  left ventricle has normal function. The left ventricle demonstrates  regional wall motion abnormalities (see scoring diagram/findings for  description). There is mild left ventricular  hypertrophy. Left ventricular diastolic parameters are indeterminate.  3. Right ventricular systolic function is mildly reduced. The right  ventricular size is normal. There is normal pulmonary artery systolic  pressure.  4. Left atrial size was mildly dilated.  5. The mitral valve has been repaired/replaced. Mild mitral valve  regurgitation. Mild mitral stenosis.  6. The  aortic valve is tricuspid. Aortic valve regurgitation is not  visualized. Mild to moderate aortic valve sclerosis/calcification is  present, without any evidence of aortic stenosis.  7. The inferior vena cava is normal in size with greater than 50%  respiratory variability, suggesting right atrial pressure of 3 mmHg.      Recent Labs: 08/20/2019: B Natriuretic Peptide 146.8; BUN 10; Creatinine, Ser 0.83; Hemoglobin 11.3; Platelets 207; Potassium 4.0; Sodium 137 08/21/2019: Magnesium 2.0; TSH 1.453  Recent Lipid Panel    Component Value Date/Time   CHOL 180 04/30/2015 0548   TRIG 249 (H) 04/30/2015 0548   HDL 27 (L) 04/30/2015 0548   CHOLHDL 6.7 04/30/2015 0548   VLDL 50 (H) 04/30/2015 0548   LDLCALC 103 (H) 04/30/2015 0548    Physical Exam:    VS:  BP 128/68   Pulse 94   Ht 4\' 11"  (1.499 m)   Wt 173 lb 6.4 oz (78.7 kg)   SpO2 96%   BMI 35.02 kg/m     Wt Readings from Last 3 Encounters:  09/15/19 173 lb 6.4 oz (78.7 kg)  08/24/19 173 lb 4.5 oz (78.6 kg)  03/10/19 175 lb (79.4 kg)     GEN: Well nourished, well developed in no acute distress HEENT: Normal NECK: No JVD; No carotid bruits LYMPHATICS: No lymphadenopathy CARDIAC: RRR, no murmurs, rubs, gallops. PPM incision well healed. Pocket without erythema, hematoma, swelling. Nontender pocket. RESPIRATORY:  Clear to auscultation without rales, wheezing or rhonchi  ABDOMEN: Soft, non-tender, non-distended MUSCULOSKELETAL:  No edema; No deformity  SKIN: Warm and dry NEUROLOGIC:  Alert and oriented x 3 PSYCHIATRIC:  Normal affect       ASSESSMENT:    1. Sinus node dysfunction (HCC)   2. Pacemaker   3. PAF (paroxysmal atrial fibrillation) (HCC)    PLAN:    In order of problems listed above:  1. Sinus node dysfunction 21% atrial pacing on device check. Lead parameters show excellent pacing/sensing thresholds, stable impedances.  PPM healing well.  2. Paroxysmal atrial fibrillation CHADSVASc 6. No history of  bleeding problems. Currently tolerating ASA/Plavix. Discussed utility of starting anticoagulant for stroke ppx with the patient and the associated risks of bleeding. She is agreeable to start apixaban. Discussed plan to stop aspirin and she is also agreeable. I would also like to start amiodarone in an effort to maintain normal rhythm given the highly symptomatic nature of her AF. - start apixaban 2.5mg   PO BID - stop aspirin 81mg  daily - start amiodarone 400mg  daily for 7 days followed by 200mg  once daily - check labs today (CBC, BMP, Mag, TSH)      Medication Adjustments/Labs and Tests Ordered: Current medicines are reviewed at length with the patient today.  Concerns regarding medicines are outlined above.  Orders Placed This Encounter  Procedures  . Basic Metabolic Panel (BMET)  . CBC w/Diff  . Magnesium  . TSH  . EKG 12-Lead   Meds ordered this encounter  Medications  . apixaban (ELIQUIS) 2.5 MG TABS tablet    Sig: Take 1 tablet (2.5 mg total) by mouth 2 (two) times daily.    Dispense:  60 tablet    Refill:  11  . amiodarone (PACERONE) 200 MG tablet    Sig: Take 2 tablets by mouth for 7 days.  Then decrease to one tablet by mouth daily.    Dispense:  97 tablet    Refill:  3    Patient Instructions  Medication Instructions:  Your physician has recommended you make the following change in your medication:   1.  START taking ELIQUIS 2.5 MG-  Take one tablet by mouth TWICE a day  2.  START taking AMIODARONE 200 mg-  --FOR 7 days your will take TWO tablets by mouth daily  --THEN reduce your amiodarone to ONE tablet by mouth daily.  3.  STOP taking ASPIRIN  Labwork: You will get lab work today: CBC, BMP, magnesium and TSH  Testing/Procedures: None ordered.  Follow-Up: Your physician wants you to follow-up as scheduled with Dr. Quentin Ore.  November 26, 2019 at 2:50 pm at the [facility] office.  Remote monitoring is used to monitor your Pacemaker from home. This  monitoring reduces the number of office visits required to check your device to one time per year. It allows [place] to keep an eye on the functioning of your device to ensure it is working properly. You are scheduled for a device check from home on 12/01/2019. You may send your transmission at any time that day. If you have a wireless device, the transmission will be sent automatically. After your physician reviews your transmission, you will receive a postcard with your next transmission date.  Any Other Special Instructions Will Be Listed Below (If Applicable).  If you need a refill on your cardiac medications before your next appointment, please call your pharmacy.   Apixaban oral tablets What is this medicine? APIXABAN (a PIX a ban) is an anticoagulant (blood thinner). It is used to lower the chance of stroke in people with a medical condition called atrial fibrillation. It is also used to treat or prevent blood clots in the lungs or in the veins. This medicine may be used for other purposes; ask your health care provider or pharmacist if you have questions. COMMON BRAND NAME(S): Eliquis What should I tell my health care provider before I take this medicine? They need to know if you have any of these conditions:  antiphospholipid antibody syndrome  bleeding disorders  bleeding in the brain  blood in your stools (black or tarry stools) or if you have blood in your vomit  history of blood clots  history of stomach bleeding  kidney disease  liver disease  mechanical heart valve  an unusual or allergic reaction to apixaban, other medicines, foods, dyes, or preservatives  pregnant or trying to get pregnant  breast-feeding How should I use this medicine? Take this medicine by mouth with a glass  of water. Follow the directions on the prescription label. You can take it with or without food. If it upsets your stomach, take it with food. Take your medicine at regular intervals. Do not  take it more often than directed. Do not stop taking except on your doctor's advice. Stopping this medicine may increase your risk of a blood clot. Be sure to refill your prescription before you run out of medicine. Talk to your pediatrician regarding the use of this medicine in children. Special care may be needed. Overdosage: If you think you have taken too much of this medicine contact a poison control center or emergency room at once. NOTE: This medicine is only for you. Do not share this medicine with others. What if I miss a dose? If you miss a dose, take it as soon as you can. If it is almost time for your next dose, take only that dose. Do not take double or extra doses. What may interact with this medicine? This medicine may interact with the following:  aspirin and aspirin-like medicines  certain medicines for fungal infections like ketoconazole and itraconazole  certain medicines for seizures like carbamazepine and phenytoin  certain medicines that treat or prevent blood clots like warfarin, enoxaparin, and dalteparin  clarithromycin  NSAIDs, medicines for pain and inflammation, like ibuprofen or naproxen  rifampin  ritonavir  St. John's wort This list may not describe all possible interactions. Give your health care provider a list of all the medicines, herbs, non-prescription drugs, or dietary supplements you use. Also tell them if you smoke, drink alcohol, or use illegal drugs. Some items may interact with your medicine. What should I watch for while using this medicine? Visit your healthcare professional for regular checks on your progress. You may need blood work done while you are taking this medicine. Your condition will be monitored carefully while you are receiving this medicine. It is important not to miss any appointments. Avoid sports and activities that might cause injury while you are using this medicine. Severe falls or injuries can cause unseen bleeding. Be  careful when using sharp tools or knives. Consider using an Copy. Take special care brushing or flossing your teeth. Report any injuries, bruising, or red spots on the skin to your healthcare professional. If you are going to need surgery or other procedure, tell your healthcare professional that you are taking this medicine. Wear a medical ID bracelet or chain. Carry a card that describes your disease and details of your medicine and dosage times. What side effects may I notice from receiving this medicine? Side effects that you should report to your doctor or health care professional as soon as possible:  allergic reactions like skin rash, itching or hives, swelling of the face, lips, or tongue  signs and symptoms of bleeding such as bloody or black, tarry stools; red or dark-brown urine; spitting up blood or brown material that looks like coffee grounds; red spots on the skin; unusual bruising or bleeding from the eye, gums, or nose  signs and symptoms of a blood clot such as chest pain; shortness of breath; pain, swelling, or warmth in the leg  signs and symptoms of a stroke such as changes in vision; confusion; trouble speaking or understanding; severe headaches; sudden numbness or weakness of the face, arm or leg; trouble walking; dizziness; loss of coordination This list may not describe all possible side effects. Call your doctor for medical advice about side effects. You may report side effects to  FDA at 1-800-FDA-1088. Where should I keep my medicine? Keep out of the reach of children. Store at room temperature between 20 and 25 degrees C (68 and 77 degrees F). Throw away any unused medicine after the expiration date. NOTE: This sheet is a summary. It may not cover all possible information. If you have questions about this medicine, talk to your doctor, pharmacist, or health care provider.  2020 Elsevier/Gold Standard (2017-09-12 17:39:34)  Amiodarone tablets What is this  medicine? AMIODARONE (a MEE oh da rone) is an antiarrhythmic drug. It helps make your heart beat regularly. Because of the side effects caused by this medicine, it is only used when other medicines have not worked. It is usually used for heartbeat problems that may be life threatening. This medicine may be used for other purposes; ask your health care provider or pharmacist if you have questions. COMMON BRAND NAME(S): Cordarone, Pacerone What should I tell my health care provider before I take this medicine? They need to know if you have any of these conditions:  liver disease  lung disease  other heart problems  thyroid disease  an unusual or allergic reaction to amiodarone, iodine, other medicines, foods, dyes, or preservatives  pregnant or trying to get pregnant  breast-feeding How should I use this medicine? Take this medicine by mouth with a glass of water. Follow the directions on the prescription label. You can take this medicine with or without food. However, you should always take it the same way each time. Take your doses at regular intervals. Do not take your medicine more often than directed. Do not stop taking except on the advice of your doctor or health care professional. A special MedGuide will be given to you by the pharmacist with each prescription and refill. Be sure to read this information carefully each time. Talk to your pediatrician regarding the use of this medicine in children. Special care may be needed. Overdosage: If you think you have taken too much of this medicine contact a poison control center or emergency room at once. NOTE: This medicine is only for you. Do not share this medicine with others. What if I miss a dose? If you miss a dose, take it as soon as you can. If it is almost time for your next dose, take only that dose. Do not take double or extra doses. What may interact with this medicine? Do not take this medicine with any of the following  medications:  abarelix  apomorphine  arsenic trioxide  certain antibiotics like erythromycin, gemifloxacin, levofloxacin, pentamidine  certain medicines for depression like amoxapine, tricyclic antidepressants  certain medicines for fungal infections like fluconazole, itraconazole, ketoconazole, posaconazole, voriconazole  certain medicines for irregular heart beat like disopyramide, dronedarone, ibutilide, propafenone, sotalol  certain medicines for malaria like chloroquine, halofantrine  cisapride  droperidol  haloperidol  haw[place]  maprotiline  methadone  phenothiazines like chlorpromazine, mesoridazine, thioridazine  pimozide  ranolazine  red yeast rice  vardenafil This medicine may also interact with the following medications:  antiviral medicines for HIV or AIDS  certain medicines for blood pressure, heart disease, irregular heart beat  certain medicines for cholesterol like atorvastatin, cerivastatin, lovastatin, simvastatin  certain medicines for hepatitis C like sofosbuvir and ledipasvir; sofosbuvir  certain medicines for seizures like phenytoin  certain medicines for thyroid problems  certain medicines that treat or prevent blood clots like warfarin  cholestyramine  cimetidine  clopidogrel  cyclosporine  dextromethorphan  diuretics  dofetilide  fentanyl  general anesthetics  grapefruit juice  lidocaine  loratadine  methotrexate  other medicines that prolong the QT interval (cause an abnormal heart rhythm)  procainamide  quinidine  rifabutin, rifampin, or rifapentine  St. John's Wort  trazodone  ziprasidone This list may not describe all possible interactions. Give your health care provider a list of all the medicines, herbs, non-prescription drugs, or dietary supplements you use. Also tell them if you smoke, drink alcohol, or use illegal drugs. Some items may interact with your medicine. What should I watch for  while using this medicine? Your condition will be monitored closely when you first begin therapy. Often, this drug is first started in a hospital or other monitored health care setting. Once you are on maintenance therapy, visit your doctor or health care professional for regular checks on your progress. Because your condition and use of this medicine carry some risk, it is a good idea to carry an identification card, necklace or bracelet with details of your condition, medications, and doctor or health care professional. Dennis Bast may get drowsy or dizzy. Do not drive, use machinery, or do anything that needs mental alertness until you know how this medicine affects you. Do not stand or sit up quickly, especially if you are an older patient. This reduces the risk of dizzy or fainting spells. This medicine can make you more sensitive to the sun. Keep out of the sun. If you cannot avoid being in the sun, wear protective clothing and use sunscreen. Do not use sun lamps or tanning beds/booths. You should have regular eye exams before and during treatment. Call your doctor if you have blurred vision, see halos, or your eyes become sensitive to light. Your eyes may get dry. It may be helpful to use a lubricating eye solution or artificial tears solution. If you are going to have surgery or a procedure that requires contrast dyes, tell your doctor or health care professional that you are taking this medicine. What side effects may I notice from receiving this medicine? Side effects that you should report to your doctor or health care professional as soon as possible:  allergic reactions like skin rash, itching or hives, swelling of the face, lips, or tongue  blue-gray coloring of the skin  blurred vision, seeing blue green halos, increased sensitivity of the eyes to light  breathing problems  chest pain  dark urine  fast, irregular heartbeat  feeling faint or light-headed  intolerance to heat or  cold  nausea or vomiting  pain and swelling of the scrotum  pain, tingling, numbness in feet, hands  redness, blistering, peeling or loosening of the skin, including inside the mouth  spitting up blood  stomach pain  sweating  unusual or uncontrolled movements of body  unusually weak or tired  weight gain or loss  yellowing of the eyes or skin Side effects that usually do not require medical attention (report to your doctor or health care professional if they continue or are bothersome):  change in sex drive or performance  constipation  dizziness  headache  loss of appetite  trouble sleeping This list may not describe all possible side effects. Call your doctor for medical advice about side effects. You may report side effects to FDA at 1-800-FDA-1088. Where should I keep my medicine? Keep out of the reach of children. Store at room temperature between 20 and 25 degrees C (68 and 77 degrees F). Protect from light. Keep container tightly closed. Throw away any unused medicine after the expiration date.  NOTE: This sheet is a summary. It may not cover all possible information. If you have questions about this medicine, talk to your doctor, pharmacist, or health care provider.  2020 Elsevier/Gold Standard (2017-12-05 13:44:04)       Signed, Vickie Epley, MD  09/15/2019 10:31 AM    [facility]

## 2019-09-15 ENCOUNTER — Other Ambulatory Visit: Payer: Self-pay

## 2019-09-15 ENCOUNTER — Encounter: Payer: Self-pay | Admitting: Cardiology

## 2019-09-15 ENCOUNTER — Ambulatory Visit: Payer: Medicare Other | Admitting: Cardiology

## 2019-09-15 VITALS — BP 128/68 | HR 94 | Ht 59.0 in | Wt 173.4 lb

## 2019-09-15 DIAGNOSIS — Z950 Presence of cardiac pacemaker: Secondary | ICD-10-CM | POA: Diagnosis not present

## 2019-09-15 DIAGNOSIS — I480 Paroxysmal atrial fibrillation: Secondary | ICD-10-CM

## 2019-09-15 DIAGNOSIS — I495 Sick sinus syndrome: Secondary | ICD-10-CM

## 2019-09-15 LAB — CUP PACEART INCLINIC DEVICE CHECK
Battery Remaining Longevity: 177 mo
Battery Voltage: 3.22 V
Brady Statistic AP VP Percent: 0.07 %
Brady Statistic AP VS Percent: 21.38 %
Brady Statistic AS VP Percent: 0.08 %
Brady Statistic AS VS Percent: 78.47 %
Brady Statistic RA Percent Paced: 21.44 %
Brady Statistic RV Percent Paced: 0.15 %
Date Time Interrogation Session: 20210830175942
Implantable Lead Implant Date: 20210806
Implantable Lead Implant Date: 20210806
Implantable Lead Location: 753859
Implantable Lead Location: 753860
Implantable Lead Model: 5076
Implantable Lead Model: 5076
Implantable Pulse Generator Implant Date: 20210806
Lead Channel Impedance Value: 342 Ohm
Lead Channel Impedance Value: 494 Ohm
Lead Channel Impedance Value: 532 Ohm
Lead Channel Impedance Value: 608 Ohm
Lead Channel Pacing Threshold Amplitude: 0.5 V
Lead Channel Pacing Threshold Amplitude: 0.5 V
Lead Channel Pacing Threshold Pulse Width: 0.4 ms
Lead Channel Pacing Threshold Pulse Width: 0.4 ms
Lead Channel Sensing Intrinsic Amplitude: 14.625 mV
Lead Channel Sensing Intrinsic Amplitude: 3.625 mV
Lead Channel Setting Pacing Amplitude: 3.5 V
Lead Channel Setting Pacing Amplitude: 3.5 V
Lead Channel Setting Pacing Pulse Width: 0.4 ms
Lead Channel Setting Sensing Sensitivity: 1.2 mV

## 2019-09-15 LAB — BASIC METABOLIC PANEL
BUN/Creatinine Ratio: 11 — ABNORMAL LOW (ref 12–28)
BUN: 9 mg/dL (ref 8–27)
CO2: 24 mmol/L (ref 20–29)
Calcium: 10 mg/dL (ref 8.7–10.3)
Chloride: 101 mmol/L (ref 96–106)
Creatinine, Ser: 0.79 mg/dL (ref 0.57–1.00)
GFR calc Af Amer: 78 mL/min/{1.73_m2} (ref >59)
GFR calc non Af Amer: 68 mL/min/{1.73_m2} (ref >59)
Glucose: 90 mg/dL (ref 65–99)
Potassium: 4.4 mmol/L (ref 3.5–5.2)
Sodium: 139 mmol/L (ref 134–144)

## 2019-09-15 LAB — TSH: TSH: 1.73 u[IU]/mL (ref 0.450–4.500)

## 2019-09-15 LAB — CBC WITH DIFFERENTIAL/PLATELET
Basophils Absolute: 0.1 10*3/uL (ref 0.0–0.2)
Basos: 1 %
EOS (ABSOLUTE): 0.1 10*3/uL (ref 0.0–0.4)
Eos: 2 %
Hematocrit: 35 % (ref 34.0–46.6)
Hemoglobin: 11.9 g/dL (ref 11.1–15.9)
Immature Grans (Abs): 0 10*3/uL (ref 0.0–0.1)
Immature Granulocytes: 1 %
Lymphocytes Absolute: 1.4 10*3/uL (ref 0.7–3.1)
Lymphs: 25 %
MCH: 29.2 pg (ref 26.6–33.0)
MCHC: 34 g/dL (ref 31.5–35.7)
MCV: 86 fL (ref 79–97)
Monocytes Absolute: 0.5 10*3/uL (ref 0.1–0.9)
Monocytes: 8 %
Neutrophils Absolute: 3.7 10*3/uL (ref 1.4–7.0)
Neutrophils: 63 %
Platelets: 224 10*3/uL (ref 150–450)
RBC: 4.08 x10E6/uL (ref 3.77–5.28)
RDW: 13.5 % (ref 11.7–15.4)
WBC: 5.8 10*3/uL (ref 3.4–10.8)

## 2019-09-15 LAB — MAGNESIUM: Magnesium: 2 mg/dL (ref 1.6–2.3)

## 2019-09-15 MED ORDER — AMIODARONE HCL 200 MG PO TABS
ORAL_TABLET | ORAL | 3 refills | Status: DC
Start: 2019-09-15 — End: 2019-10-22

## 2019-09-15 MED ORDER — APIXABAN 2.5 MG PO TABS: 2.5000 mg | ORAL_TABLET | Freq: Two times a day (BID) | ORAL | 11 refills | Status: DC

## 2019-09-15 NOTE — Patient Instructions (Addendum)
Medication Instructions:  Your physician has recommended you make the following change in your medication:   1.  START taking ELIQUIS 2.5 MG-  Take one tablet by mouth TWICE a day  2.  START taking AMIODARONE 200 mg-  --FOR 7 days your will take TWO tablets by mouth daily  --THEN reduce your amiodarone to ONE tablet by mouth daily.  3.  STOP taking ASPIRIN  Labwork: You will get lab work today: CBC, BMP, magnesium and TSH  Testing/Procedures: None ordered.  Follow-Up: Your physician wants you to follow-up as scheduled with Dr. Quentin Ore.  November 26, 2019 at 2:50 pm at the [facility] office.  Remote monitoring is used to monitor your Pacemaker from home. This monitoring reduces the number of office visits required to check your device to one time per year. It allows [place] to keep an eye on the functioning of your device to ensure it is working properly. You are scheduled for a device check from home on 12/01/2019. You may send your transmission at any time that day. If you have a wireless device, the transmission will be sent automatically. After your physician reviews your transmission, you will receive a postcard with your next transmission date.  Any Other Special Instructions Will Be Listed Below (If Applicable).  If you need a refill on your cardiac medications before your next appointment, please call your pharmacy.   Apixaban oral tablets What is this medicine? APIXABAN (a PIX a ban) is an anticoagulant (blood thinner). It is used to lower the chance of stroke in people with a medical condition called atrial fibrillation. It is also used to treat or prevent blood clots in the lungs or in the veins. This medicine may be used for other purposes; ask your health care provider or pharmacist if you have questions. COMMON BRAND NAME(S): Eliquis What should I tell my health care provider before I take this medicine? They need to know if you have any of these  conditions:  antiphospholipid antibody syndrome  bleeding disorders  bleeding in the brain  blood in your stools (black or tarry stools) or if you have blood in your vomit  history of blood clots  history of stomach bleeding  kidney disease  liver disease  mechanical heart valve  an unusual or allergic reaction to apixaban, other medicines, foods, dyes, or preservatives  pregnant or trying to get pregnant  breast-feeding How should I use this medicine? Take this medicine by mouth with a glass of water. Follow the directions on the prescription label. You can take it with or without food. If it upsets your stomach, take it with food. Take your medicine at regular intervals. Do not take it more often than directed. Do not stop taking except on your doctor's advice. Stopping this medicine may increase your risk of a blood clot. Be sure to refill your prescription before you run out of medicine. Talk to your pediatrician regarding the use of this medicine in children. Special care may be needed. Overdosage: If you think you have taken too much of this medicine contact a poison control center or emergency room at once. NOTE: This medicine is only for you. Do not share this medicine with others. What if I miss a dose? If you miss a dose, take it as soon as you can. If it is almost time for your next dose, take only that dose. Do not take double or extra doses. What may interact with this medicine? This medicine may interact with  the following:  aspirin and aspirin-like medicines  certain medicines for fungal infections like ketoconazole and itraconazole  certain medicines for seizures like carbamazepine and phenytoin  certain medicines that treat or prevent blood clots like warfarin, enoxaparin, and dalteparin  clarithromycin  NSAIDs, medicines for pain and inflammation, like ibuprofen or naproxen  rifampin  ritonavir  St. John's wort This list may not describe all  possible interactions. Give your health care provider a list of all the medicines, herbs, non-prescription drugs, or dietary supplements you use. Also tell them if you smoke, drink alcohol, or use illegal drugs. Some items may interact with your medicine. What should I watch for while using this medicine? Visit your healthcare professional for regular checks on your progress. You may need blood work done while you are taking this medicine. Your condition will be monitored carefully while you are receiving this medicine. It is important not to miss any appointments. Avoid sports and activities that might cause injury while you are using this medicine. Severe falls or injuries can cause unseen bleeding. Be careful when using sharp tools or knives. Consider using an Copy. Take special care brushing or flossing your teeth. Report any injuries, bruising, or red spots on the skin to your healthcare professional. If you are going to need surgery or other procedure, tell your healthcare professional that you are taking this medicine. Wear a medical ID bracelet or chain. Carry a card that describes your disease and details of your medicine and dosage times. What side effects may I notice from receiving this medicine? Side effects that you should report to your doctor or health care professional as soon as possible:  allergic reactions like skin rash, itching or hives, swelling of the face, lips, or tongue  signs and symptoms of bleeding such as bloody or black, tarry stools; red or dark-brown urine; spitting up blood or brown material that looks like coffee grounds; red spots on the skin; unusual bruising or bleeding from the eye, gums, or nose  signs and symptoms of a blood clot such as chest pain; shortness of breath; pain, swelling, or warmth in the leg  signs and symptoms of a stroke such as changes in vision; confusion; trouble speaking or understanding; severe headaches; sudden numbness or  weakness of the face, arm or leg; trouble walking; dizziness; loss of coordination This list may not describe all possible side effects. Call your doctor for medical advice about side effects. You may report side effects to FDA at 1-800-FDA-1088. Where should I keep my medicine? Keep out of the reach of children. Store at room temperature between 20 and 25 degrees C (68 and 77 degrees F). Throw away any unused medicine after the expiration date. NOTE: This sheet is a summary. It may not cover all possible information. If you have questions about this medicine, talk to your doctor, pharmacist, or health care provider.  2020 Elsevier/Gold Standard (2017-09-12 17:39:34)  Amiodarone tablets What is this medicine? AMIODARONE (a MEE oh da rone) is an antiarrhythmic drug. It helps make your heart beat regularly. Because of the side effects caused by this medicine, it is only used when other medicines have not worked. It is usually used for heartbeat problems that may be life threatening. This medicine may be used for other purposes; ask your health care provider or pharmacist if you have questions. COMMON BRAND NAME(S): Cordarone, Pacerone What should I tell my health care provider before I take this medicine? They need to know if you have  any of these conditions:  liver disease  lung disease  other heart problems  thyroid disease  an unusual or allergic reaction to amiodarone, iodine, other medicines, foods, dyes, or preservatives  pregnant or trying to get pregnant  breast-feeding How should I use this medicine? Take this medicine by mouth with a glass of water. Follow the directions on the prescription label. You can take this medicine with or without food. However, you should always take it the same way each time. Take your doses at regular intervals. Do not take your medicine more often than directed. Do not stop taking except on the advice of your doctor or health care professional. A  special MedGuide will be given to you by the pharmacist with each prescription and refill. Be sure to read this information carefully each time. Talk to your pediatrician regarding the use of this medicine in children. Special care may be needed. Overdosage: If you think you have taken too much of this medicine contact a poison control center or emergency room at once. NOTE: This medicine is only for you. Do not share this medicine with others. What if I miss a dose? If you miss a dose, take it as soon as you can. If it is almost time for your next dose, take only that dose. Do not take double or extra doses. What may interact with this medicine? Do not take this medicine with any of the following medications:  abarelix  apomorphine  arsenic trioxide  certain antibiotics like erythromycin, gemifloxacin, levofloxacin, pentamidine  certain medicines for depression like amoxapine, tricyclic antidepressants  certain medicines for fungal infections like fluconazole, itraconazole, ketoconazole, posaconazole, voriconazole  certain medicines for irregular heart beat like disopyramide, dronedarone, ibutilide, propafenone, sotalol  certain medicines for malaria like chloroquine, halofantrine  cisapride  droperidol  haloperidol  [place]  maprotiline  methadone  phenothiazines like chlorpromazine, mesoridazine, thioridazine  pimozide  ranolazine  red yeast rice  vardenafil This medicine may also interact with the following medications:  antiviral medicines for HIV or AIDS  certain medicines for blood pressure, heart disease, irregular heart beat  certain medicines for cholesterol like atorvastatin, cerivastatin, lovastatin, simvastatin  certain medicines for hepatitis C like sofosbuvir and ledipasvir; sofosbuvir  certain medicines for seizures like phenytoin  certain medicines for thyroid problems  certain medicines that treat or prevent blood clots like  warfarin  cholestyramine  cimetidine  clopidogrel  cyclosporine  dextromethorphan  diuretics  dofetilide  fentanyl  general anesthetics  grapefruit juice  lidocaine  loratadine  methotrexate  other medicines that prolong the QT interval (cause an abnormal heart rhythm)  procainamide  quinidine  rifabutin, rifampin, or rifapentine  St. John's Wort  trazodone  ziprasidone This list may not describe all possible interactions. Give your health care provider a list of all the medicines, herbs, non-prescription drugs, or dietary supplements you use. Also tell them if you smoke, drink alcohol, or use illegal drugs. Some items may interact with your medicine. What should I watch for while using this medicine? Your condition will be monitored closely when you first begin therapy. Often, this drug is first started in a hospital or other monitored health care setting. Once you are on maintenance therapy, visit your doctor or health care professional for regular checks on your progress. Because your condition and use of this medicine carry some risk, it is a good idea to carry an identification card, necklace or bracelet with details of your condition, medications, and doctor or health care professional.  You may get drowsy or dizzy. Do not drive, use machinery, or do anything that needs mental alertness until you know how this medicine affects you. Do not stand or sit up quickly, especially if you are an older patient. This reduces the risk of dizzy or fainting spells. This medicine can make you more sensitive to the sun. Keep out of the sun. If you cannot avoid being in the sun, wear protective clothing and use sunscreen. Do not use sun lamps or tanning beds/booths. You should have regular eye exams before and during treatment. Call your doctor if you have blurred vision, see halos, or your eyes become sensitive to light. Your eyes may get dry. It may be helpful to use a  lubricating eye solution or artificial tears solution. If you are going to have surgery or a procedure that requires contrast dyes, tell your doctor or health care professional that you are taking this medicine. What side effects may I notice from receiving this medicine? Side effects that you should report to your doctor or health care professional as soon as possible:  allergic reactions like skin rash, itching or hives, swelling of the face, lips, or tongue  blue-gray coloring of the skin  blurred vision, seeing blue green halos, increased sensitivity of the eyes to light  breathing problems  chest pain  dark urine  fast, irregular heartbeat  feeling faint or light-headed  intolerance to heat or cold  nausea or vomiting  pain and swelling of the scrotum  pain, tingling, numbness in feet, hands  redness, blistering, peeling or loosening of the skin, including inside the mouth  spitting up blood  stomach pain  sweating  unusual or uncontrolled movements of body  unusually weak or tired  weight gain or loss  yellowing of the eyes or skin Side effects that usually do not require medical attention (report to your doctor or health care professional if they continue or are bothersome):  change in sex drive or performance  constipation  dizziness  headache  loss of appetite  trouble sleeping This list may not describe all possible side effects. Call your doctor for medical advice about side effects. You may report side effects to FDA at 1-800-FDA-1088. Where should I keep my medicine? Keep out of the reach of children. Store at room temperature between 20 and 25 degrees C (68 and 77 degrees F). Protect from light. Keep container tightly closed. Throw away any unused medicine after the expiration date. NOTE: This sheet is a summary. It may not cover all possible information. If you have questions about this medicine, talk to your doctor, pharmacist, or health  care provider.  2020 Elsevier/Gold Standard (2017-12-05 13:44:04)

## 2019-09-16 NOTE — Progress Notes (Signed)
Cardiology Office Note:    Date:  09/17/2019   ID:  Heather [name], DOB 12-Oct-1930, MRN [id number]  PCP:  Leonard Downing, MD  Cardiologist:  Shirlee More, MD    Referring MD: Leonard Downing, *    ASSESSMENT:    1. PAF (paroxysmal atrial fibrillation) ([place])   2. On amiodarone therapy   3. Pacemaker   4. Sinus node dysfunction (HCC)   5. Coronary artery disease of bypass graft of native heart with stable angina pectoris ([place])   6. H/O mitral valve repair   7. Hypertensive heart disease with chronic diastolic congestive heart failure ([place])   8. Mixed hyperlipidemia    PLAN:    In order of problems listed above:  1. Overall is doing well tolerating her amiodarone maintaining sinus rhythm.  She has had good response to pacemaker and is not having palpitation or syncope and normal device function. 2. 1 month check liver function thyroid studies at her PCP office copy to me 3. Stable CAD having no anginal discomfort continue treatment including clopidogrel calcium channel blocker and nonstatin lipid-lowering Zetia 4. Stable no evidence of recurrent severe mitral regurgitation 5. Compensated she has no edema New York Heart Association class I BP is at target continue calcium channel blocker 6. Continue Zetia I asked her to have a lipid profile done with her labs in 1 month   Next appointment: 3 months   Medication Adjustments/Labs and Tests Ordered: Current medicines are reviewed at length with the patient today.  Concerns regarding medicines are outlined above.  Orders Placed This Encounter  Procedures  . EKG 12-Lead   No orders of the defined types were placed in this encounter.   Chief Complaint  Patient presents with  . Follow-up    Recent pacemaker insertion for symptomatic heart block  . Atrial Fibrillation    Started on oral amiodarone 2 days ago    History of Present Illness:    Heather [name] is a 86 y.o. female with a hx of  coronary artery  disease previous CABG and mitral valve repair 2003 inferior ST segment elevation MI in 2015 with PCI of the saphenous vein graft to the posterior lateral artery paroxysmal atrial fibrillation hyperlipidemia with statin intolerance and heart failure with preserved ejection fraction.  Her coronary angiogram was December 2017 at that time she had no further PCI or stenting performed.  She had recent admission [facility] with chest pain paroxysmal atrial fibrillation bradycardia and permanent dual-chamber pacemaker insertion. She was last seen 03/10/2019.  Compliance with diet, lifestyle and medications: Yes  She is obviously traumatized by overdose and found herself to be frightened.  She has not had Covid vaccine and she is not going to.  She tolerates anticoagulant without bleeding and takes both clopidogrel and Eliquis.  She is having no muscle pain from her lipid-lowering therapy and is not having palpitation edema shortness of breath.  She was seen 09/15/2019 by EP with recurrent atrial fibrillation symptomatic she is initiated on anticoagulation and was started on oral amiodarone loading.  Testing reviewed for this visit: EKG 08/24/2019 shows bifascicular heart block second-degree AV block personally reviewed EKG 09/15/2019 personally reviewed sinus rhythm right bundle branch block first-degree AV block  Pacemaker interrogation: personally reviewed. 21.4% AP. 56 hour AF episode. Capture, sensing thresholds excellent. Impedances stable. AAIR>DDDR 60-110.   08/21/2019 Echo  1. Hypokinesis of the basal inferior and inferolateral wall with overall  preserved LV function; mild LVH; s/p MV  repair with mild MS (mean gradient  5 mmHg) and mild MR; mild LAE; mild RV dysfunction.   2. Left ventricular ejection fraction, by estimation, is 55 to 60%. The  left ventricle has normal function. The left ventricle demonstrates  regional wall motion abnormalities (see scoring diagram/findings for   description). There is mild left ventricular   hypertrophy. Left ventricular diastolic parameters are indeterminate.   3. Right ventricular systolic function is mildly reduced. The right  ventricular size is normal. There is normal pulmonary artery systolic  pressure.   4. Left atrial size was mildly dilated.   5. The mitral valve has been repaired/replaced. Mild mitral valve  regurgitation. Mild mitral stenosis.   6. The aortic valve is tricuspid. Aortic valve regurgitation is not  visualized. Mild to moderate aortic valve sclerosis/calcification is  present, without any evidence of aortic stenosis.   7. The inferior vena cava is normal in size with greater than 50%  respiratory variability, suggesting right atrial pressure of 3 mmHg.   Past Medical History:  Diagnosis Date  . Arthritis   . CAD (coronary artery disease), native coronary artery 07/14/2013   Catheterization August/2003 showing severe three-vessel disease and moderate mitral regurgitation 08/28/2001 CABG with internal mammary graft to LAD, vein graft to circumflex, and vein graft to posterolateral branch and mitral valve repair by Dr. 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 Patent internal mammary graft with competitive flow in the distal vessel, occluded right coronary artery, occluded circumflex, patent saphenous vein graft to circumflex with mild/moderate disease proximally, minimal LV damage   . Chronic diastolic CHF (congestive heart failure) (HCC)    a. EF 60% by LV gram 01/2009.  . Coronary artery disease    a. s/p MI in 1980;  b. 2003 CABG x 3 (LIMA->LAD, VG->OM, VG->RPL);  c. 01/2009 Cath: 3/3 patent grafts, native 3VD, EF 60%.  Marland Kitchen GERD (gastroesophageal reflux disease)   . H/O mitral valve repair    a. 2003  . Hyperlipidemia 07/14/2013   Intolerance to multiple statins including Crestor, pravastatin, and Lipitor   .  Hypertension   . Hypothyroidism   . NSTEMI (non-ST elevated myocardial infarction) ([place])   . PAF (paroxysmal atrial fibrillation) ([place])   . S/P partial hysterectomy 1977    Past Surgical History:  Procedure Laterality Date  . ABDOMINAL HYSTERECTOMY  1977  . APPENDECTOMY  1950  . BLADDER SUSPENSION     tac  . CARDIAC CATHETERIZATION  03,05,11  . CARDIAC CATHETERIZATION N/A 04/30/2015   Procedure: Left Heart Cath and Cors/Grafts Angiography;  Surgeon: Belva Crome, MD;  Location: [place] [facility];  Service: Cardiovascular;  Laterality: N/A;  . CARDIAC CATHETERIZATION N/A 04/30/2015   Procedure: Coronary Stent Intervention;  Surgeon: Belva Crome, MD;  Location: [place] [facility];  Service: Cardiovascular;  Laterality: N/A;  Proximal and Distal SVG to the PLA  . CARDIAC CATHETERIZATION N/A 01/06/2016   Procedure: Left Heart Cath and Cors/Grafts Angiography;  Surgeon: Jettie Booze, MD;  Location: [place] [facility];  Service: Cardiovascular;  Laterality: N/A;  . CARPAL TUNNEL RELEASE     right and left  . CHOLECYSTECTOMY    . CORONARY ARTERY BYPASS GRAFT  2003  . KNEE ARTHROSCOPY     left  . LEFT HEART CATHETERIZATION WITH CORONARY ANGIOGRAM N/A 07/12/2013   Procedure: LEFT HEART CATHETERIZATION WITH CORONARY ANGIOGRAM;  Surgeon: Jettie Booze, MD;  Location: [place] [facility];  Service: Cardiovascular;  Laterality: N/A;  . MITRAL VALVE REPAIR  2003   with cabg  . PACEMAKER IMPLANT N/A 08/22/2019   Procedure: PACEMAKER IMPLANT;  Surgeon: Constance Haw, MD;  Location: [place] [facility];  Service: Cardiovascular;  Laterality: N/A;  . THYROIDECTOMY  1968  . THYROIDECTOMY, PARTIAL     left    Current Medications: Current Meds  Medication Sig  . acetaminophen (TYLENOL) 500 MG tablet Take 500 mg by mouth every 6 (six) hours as needed for mild pain or moderate pain.   Marland Kitchen amiodarone (PACERONE) 200 MG tablet Take 2 tablets by mouth for 7 days.  Then decrease to one tablet by  mouth daily.  Marland Kitchen amLODipine (NORVASC) 2.5 MG tablet TAKE 1 TABLET BY MOUTH ONCE DAILY NEEDS  APPOINTMENT  WITH  CARDIOLOGIST (Patient taking differently: Take 2.5 mg by mouth daily. )  . apixaban (ELIQUIS) 2.5 MG TABS tablet Take 1 tablet (2.5 mg total) by mouth 2 (two) times daily.  . cholecalciferol (VITAMIN D3) 25 MCG (1000 UNIT) tablet Take 1,000 Units by mouth daily.  . clopidogrel (PLAVIX) 75 MG tablet Take 1 tablet (75 mg total) by mouth daily.  Marland Kitchen ezetimibe (ZETIA) 10 MG tablet Take 1 tablet (10 mg total) by mouth daily.  . famotidine (PEPCID) 10 MG tablet Take 10 mg by mouth daily as needed for heartburn or indigestion.  Marland Kitchen levothyroxine (SYNTHROID, LEVOTHROID) 50 MCG tablet Take 50 mcg by mouth daily.  . nitroGLYCERIN (NITROSTAT) 0.4 MG SL tablet Place 0.4 mg under the tongue every 5 (five) minutes as needed for chest pain.     Allergies:   Atorvastatin, Codeine, Crestor [rosuvastatin], Pravastatin, Simvastatin, and Ticagrelor   Social History   Socioeconomic History  . Marital status: Widowed    Spouse name: Not on file  . Number of children: Not on file  . Years of education: Not on file  . Highest education level: Not on file  Occupational History  . Not on file  Tobacco Use  . Smoking status: Former Smoker    Quit date: 06/06/1967    Years since quitting: 52.3  . Smokeless tobacco: Former Network engineer  . Vaping Use: Never used  Substance and Sexual Activity  . Alcohol use: No  . Drug use: No  . Sexual activity: Not Currently  Other Topics Concern  . Not on file  Social History Narrative   Lives in [place].  Widowed.   Social Determinants of Health   Financial Resource Strain:   . Difficulty of Paying Living Expenses: Not on file  Food Insecurity:   . Worried About Charity fundraiser in the Last Year: Not on file  . Ran Out of Food in the Last Year: Not on file  Transportation Needs:   . Lack of Transportation (Medical): Not on file  . Lack of  Transportation (Non-Medical): Not on file  Physical Activity:   . Days of Exercise per Week: Not on file  . Minutes of Exercise per Session: Not on file  Stress:   . Feeling of Stress : Not on file  Social Connections:   . Frequency of Communication with Friends and Family: Not on file  . Frequency of Social Gatherings with Friends and Family: Not on file  . Attends Religious Services: Not on file  . Active Member of Clubs or Organizations: Not on file  . Attends Archivist Meetings: Not on file  . Marital Status:  Not on file     Family History: The patient's family history includes Dementia in her mother and sister; Heart Problems in her father, paternal grandfather, and sister; Other in an other family member; Tuberculosis in her maternal grandmother. ROS:   Please see the history of present illness.    All other systems reviewed and are negative.  EKGs/Labs/Other Studies Reviewed:    The following studies were reviewed today:  EKG:  EKG ordered today and personally reviewed.  The ekg ordered today demonstrates sinus rhythm first-degree AV block right bundle branch block  Recent Labs: 08/20/2019: B Natriuretic Peptide 146.8 09/15/2019: BUN 9; Creatinine, Ser 0.79; Hemoglobin 11.9; Magnesium 2.0; Platelets 224; Potassium 4.4; Sodium 139; TSH 1.730  Recent Lipid Panel    Component Value Date/Time   CHOL 180 04/30/2015 0548   TRIG 249 (H) 04/30/2015 0548   HDL 27 (L) 04/30/2015 0548   CHOLHDL 6.7 04/30/2015 0548   VLDL 50 (H) 04/30/2015 0548   LDLCALC 103 (H) 04/30/2015 0548    Physical Exam:    VS:  BP 131/76   Pulse 90   Ht 4\' 11"  (1.499 m)   Wt 174 lb 9.6 oz (79.2 kg)   SpO2 97%   BMI 35.26 kg/m     Wt Readings from Last 3 Encounters:  09/17/19 174 lb 9.6 oz (79.2 kg)  09/15/19 173 lb 6.4 oz (78.7 kg)  08/24/19 173 lb 4.5 oz (78.6 kg)     GEN:  Well nourished, well developed in no acute distress HEENT: Normal NECK: No JVD; No carotid bruits  LYMPHATICS: No lymphadenopathy CARDIAC: RRR, no murmurs, rubs, gallops RESPIRATORY:  Clear to auscultation without rales, wheezing or rhonchi  ABDOMEN: Soft, non-tender, non-distended MUSCULOSKELETAL:  No edema; No deformity  SKIN: Warm and dry NEUROLOGIC:  Alert and oriented x 3 PSYCHIATRIC:  Normal affect    Signed, Shirlee More, MD  09/17/2019 2:23 PM    [place]

## 2019-09-17 ENCOUNTER — Other Ambulatory Visit: Payer: Self-pay

## 2019-09-17 ENCOUNTER — Encounter: Payer: Self-pay | Admitting: Cardiology

## 2019-09-17 ENCOUNTER — Ambulatory Visit: Payer: Medicare Other | Admitting: Cardiology

## 2019-09-17 VITALS — BP 131/76 | HR 90 | Ht 59.0 in | Wt 174.6 lb

## 2019-09-17 DIAGNOSIS — I495 Sick sinus syndrome: Secondary | ICD-10-CM

## 2019-09-17 DIAGNOSIS — E782 Mixed hyperlipidemia: Secondary | ICD-10-CM

## 2019-09-17 DIAGNOSIS — I110 Hypertensive heart disease with heart failure: Secondary | ICD-10-CM

## 2019-09-17 DIAGNOSIS — Z950 Presence of cardiac pacemaker: Secondary | ICD-10-CM

## 2019-09-17 DIAGNOSIS — Z79899 Other long term (current) drug therapy: Secondary | ICD-10-CM | POA: Diagnosis not present

## 2019-09-17 DIAGNOSIS — I480 Paroxysmal atrial fibrillation: Secondary | ICD-10-CM | POA: Diagnosis not present

## 2019-09-17 DIAGNOSIS — Z9889 Other specified postprocedural states: Secondary | ICD-10-CM

## 2019-09-17 DIAGNOSIS — I5032 Chronic diastolic (congestive) heart failure: Secondary | ICD-10-CM

## 2019-09-17 DIAGNOSIS — I25708 Atherosclerosis of coronary artery bypass graft(s), unspecified, with other forms of angina pectoris: Secondary | ICD-10-CM

## 2019-09-17 NOTE — Progress Notes (Addendum)
Pacemaker check  and wound check in clinic. Steri-strips removed, wound edges well approximated, no redness, drainage or s/sx of infection.Normal device function. Thresholds, sensing, impedances consistent with previous measurements. Device programmed to outputs of 3.5v in RA/RV leads . AT/AF 19.6%, 4 AMS episodes that show AF , longest episode was 56 hours, 6, minutes and 21seconds. No high ventricular rates noted. Device programmed at appropriate safety margins. Histogram distribution appropriate for patient activity level. Device programmed to optimize intrinsic conduction. Estimated longevity . Patient enrolled in remote follow-up 11/21/19. Remote monitor set up and transmission sent. Patient education completed. Follow up with Dr Quentin Ore 09/15/19 for new onset AF.

## 2019-09-17 NOTE — Patient Instructions (Signed)
Medication Instructions:  Your physician recommends that you continue on your current medications as directed. Please refer to the Current Medication list given to you today.  *If you need a refill on your cardiac medications before your next appointment, please call your pharmacy*   Lab Work: Your physician recommends that you return for lab work in: Please have your PCP draw a Lipid panel, complete metabolic panel, TSH, T3, T4 (FREE) at your next office visit with him.  If you have labs (blood work) drawn today and your tests are completely normal, you will receive your results only by: Marland Kitchen MyChart Message (if you have MyChart) OR . A paper copy in the mail If you have any lab test that is abnormal or we need to change your treatment, we will call you to review the results.   Testing/Procedures: None   Follow-Up: At [facility], you and your health needs are our priority.  As part of our continuing mission to provide you with exceptional heart care, we have created designated Provider Care Teams.  These Care Teams include your primary Cardiologist (physician) and Advanced Practice Providers (APPs -  Physician Assistants and Nurse Practitioners) who all work together to provide you with the care you need, when you need it.  We recommend signing up for the patient portal called "MyChart".  Sign up information is provided on this After Visit Summary.  MyChart is used to connect with patients for Virtual Visits (Telemedicine).  Patients are able to view lab/test results, encounter notes, upcoming appointments, etc.  Non-urgent messages can be sent to your provider as well.   To learn more about what you can do with MyChart, go to [email or web address].    Your next appointment:   3 month(s)  The format for your next appointment:   In Person  Provider:   Shirlee More, MD   Other Instructions

## 2019-10-15 ENCOUNTER — Telehealth: Payer: Self-pay | Admitting: Cardiology

## 2019-10-15 NOTE — Telephone Encounter (Signed)
*  STAT* If patient is at the pharmacy, call can be transferred to refill team.   1. Which medications need to be refilled? (please list name of each medication and dose if known)  apixaban (ELIQUIS) 2.5 MG TABS tablet  2. Which pharmacy/location (including street and city if local pharmacy) is medication to be sent to? [facility] [place], [address]  3. Do they need a 30 day or 90 day supply? 43   Pt states she can get the medication with no copay if she gets her medicine from [facility]

## 2019-10-15 NOTE — Telephone Encounter (Signed)
  Dr Quentin Ore,  Received refill request for Eliquis 2.5mg  BID.  Patient qualifies for 5 mg BID due to renal function and weight.  Change Rx or refill?  Indication: PAF Last office visit: 09/17/19 Scr: 0.79 Age: 86 Weight: 79.2

## 2019-10-16 ENCOUNTER — Telehealth: Payer: Self-pay | Admitting: Pharmacist

## 2019-10-16 DIAGNOSIS — I480 Paroxysmal atrial fibrillation: Secondary | ICD-10-CM

## 2019-10-16 MED ORDER — APIXABAN 5 MG PO TABS: 5.0000 mg | ORAL_TABLET | Freq: Two times a day (BID) | ORAL | 1 refills | Status: AC

## 2019-10-16 NOTE — Telephone Encounter (Signed)
Changing Eliquis to 5mg  PO BID per weight and kidney function.  Patient is aware.  Prefers medications be sent to [facility] now

## 2019-10-17 ENCOUNTER — Other Ambulatory Visit: Payer: Self-pay

## 2019-10-17 MED ORDER — EZETIMIBE 10 MG PO TABS: 10.0000 mg | ORAL_TABLET | Freq: Every day | ORAL | 2 refills | Status: DC

## 2019-10-17 NOTE — Telephone Encounter (Signed)
Refill sent to [facility] for Ezetimibe.

## 2019-10-22 ENCOUNTER — Other Ambulatory Visit: Payer: Self-pay

## 2019-10-22 MED ORDER — AMIODARONE HCL 200 MG PO TABS: ORAL_TABLET | ORAL | 3 refills | Status: DC

## 2019-10-22 NOTE — Telephone Encounter (Signed)
Pt's medication was resent to pt's pharmacy as requested by pt to [facility] mail order pharmacy. Confirmation received.

## 2019-11-21 ENCOUNTER — Ambulatory Visit (INDEPENDENT_AMBULATORY_CARE_PROVIDER_SITE_OTHER): Payer: Medicare Other

## 2019-11-21 DIAGNOSIS — I495 Sick sinus syndrome: Secondary | ICD-10-CM | POA: Diagnosis not present

## 2019-11-21 LAB — CUP PACEART REMOTE DEVICE CHECK
Battery Remaining Longevity: 153 mo
Battery Voltage: 3.19 V
Brady Statistic AP VP Percent: 0.07 %
Brady Statistic AP VS Percent: 78.53 %
Brady Statistic AS VP Percent: 0.03 %
Brady Statistic AS VS Percent: 21.37 %
Brady Statistic RA Percent Paced: 78.63 %
Brady Statistic RV Percent Paced: 0.1 %
Date Time Interrogation Session: 20211104200635
Implantable Lead Implant Date: 20210806
Implantable Lead Implant Date: 20210806
Implantable Lead Location: 753859
Implantable Lead Location: 753860
Implantable Lead Model: 5076
Implantable Lead Model: 5076
Implantable Pulse Generator Implant Date: 20210806
Lead Channel Impedance Value: 323 Ohm
Lead Channel Impedance Value: 475 Ohm
Lead Channel Impedance Value: 475 Ohm
Lead Channel Impedance Value: 551 Ohm
Lead Channel Pacing Threshold Amplitude: 0.5 V
Lead Channel Pacing Threshold Amplitude: 0.625 V
Lead Channel Pacing Threshold Pulse Width: 0.4 ms
Lead Channel Pacing Threshold Pulse Width: 0.4 ms
Lead Channel Sensing Intrinsic Amplitude: 12.625 mV
Lead Channel Sensing Intrinsic Amplitude: 12.625 mV
Lead Channel Sensing Intrinsic Amplitude: 2.5 mV
Lead Channel Sensing Intrinsic Amplitude: 2.5 mV
Lead Channel Setting Pacing Amplitude: 3.5 V
Lead Channel Setting Pacing Amplitude: 3.5 V
Lead Channel Setting Pacing Pulse Width: 0.4 ms
Lead Channel Setting Sensing Sensitivity: 1.2 mV

## 2019-11-21 NOTE — Progress Notes (Signed)
Remote pacemaker transmission.   

## 2019-11-24 ENCOUNTER — Ambulatory Visit: Payer: Self-pay

## 2019-11-24 ENCOUNTER — Other Ambulatory Visit: Payer: Self-pay

## 2019-11-24 ENCOUNTER — Encounter: Payer: Self-pay | Admitting: Specialist

## 2019-11-24 ENCOUNTER — Ambulatory Visit: Payer: Medicare Other | Admitting: Specialist

## 2019-11-24 VITALS — BP 187/70 | HR 79 | Ht 59.0 in | Wt 174.7 lb

## 2019-11-24 DIAGNOSIS — M25552 Pain in left hip: Secondary | ICD-10-CM | POA: Diagnosis not present

## 2019-11-24 DIAGNOSIS — M1612 Unilateral primary osteoarthritis, left hip: Secondary | ICD-10-CM

## 2019-11-24 MED ORDER — METHYLPREDNISOLONE 4 MG PO TABS: ORAL_TABLET | ORAL | 0 refills | Status: DC

## 2019-11-24 MED ORDER — GABAPENTIN 100 MG PO CAPS: 100.0000 mg | ORAL_CAPSULE | Freq: Every day | ORAL | 1 refills | Status: DC

## 2019-11-24 NOTE — Progress Notes (Signed)
Office Visit Note   Patient: Heather [name]           Date of Birth: 12-01-1930           MRN: [id number] Visit Date: 11/24/2019              Requested by: Leonard Downing, MD [place],  [place] [id number] PCP: Leonard Downing, MD   Assessment & Plan: Visit Diagnoses:  1. Pain in left hip   2. Unilateral primary osteoarthritis, left hip     Plan: Plan: Left hip is suffering from osteoarthritis, only real proven treatments are Do not take arthritis medications like motrin, naprosyn or alleve. Well padded shoes help. Ice the hip that is suffering from osteoarthritis, only real proven treatments are  Well padded shoes help. Ice the hip2-3 times a day 15-20 mins at a time.-3 times a day 15-20 mins at a time. Hot showers in the AM.  Injection with steroid may be of benefit. Hemp CBD capsules, amazon.com 5,000-7,000 mg per bottle, 60 capsules per bottle, take one capsule twice a day. Start gabapentin for night pain 100 mg at night Medrol taper low dose 5 mg or one whole tablet daily for 2 weeks then 1/2 tablet daily till out. Schedule appointment to see Dr. Ninfa Linden for scheduling a left total hip replacement. . Follow-Up Instructions: No follow-ups on file.   Follow-Up Instructions: Return in about 3 weeks (around 12/15/2019).   Orders:  Orders Placed This Encounter  Procedures  . XR HIP UNILAT W OR W/O PELVIS 2-3 VIEWS LEFT   No orders of the defined types were placed in this encounter.     Procedures: No procedures performed   Clinical Data: No additional findings.   Subjective: Chief Complaint  Patient presents with  . Left Hip - Pain    86 year old female with history of left hip pain with pain into the left anterior groin and left thigh to the left knee. Pain with standing and walking and having night pain now. She has a cardiac conditon and has seen Dr. Wynonia Lawman and he retired and she is now seeing Dr. Bettina Gavia. Underwent a  pacemaker placement about 08/22/2019. She having severe left hip pain and difficulty standing and walking. No numbness, no paresthesias, no bowel or bladder difficulty except for constipation. She has difficulty reaching her feet and shoes. Difficulty walking up or down stares. She take  An occasional tylenol. Pain with rolling over in bad and with sneeze.    Review of Systems  Constitutional: Negative for activity change, appetite change, chills, diaphoresis, fatigue, fever and unexpected weight change.  HENT: Positive for congestion, sinus pressure, sinus pain and sneezing. Negative for dental problem, drooling, ear discharge, ear pain, facial swelling, hearing loss, mouth sores, nosebleeds, postnasal drip, rhinorrhea, sore throat, tinnitus, trouble swallowing and voice change.   Respiratory: Negative.  Negative for apnea, cough, choking, chest tightness, shortness of breath, wheezing and stridor.   Cardiovascular: Negative.  Negative for chest pain, palpitations and leg swelling.  Gastrointestinal: Negative.  Negative for abdominal distention, abdominal pain, anal bleeding, blood in stool, constipation, diarrhea, nausea, rectal pain and vomiting.  Endocrine: Negative.  Negative for cold intolerance, heat intolerance, polydipsia, polyphagia and polyuria.  Genitourinary: Negative.  Negative for difficulty urinating, dyspareunia, dysuria, enuresis, flank pain, frequency and genital sores.  Musculoskeletal: Positive for back pain. Negative for arthralgias, gait problem, joint swelling, myalgias, neck pain and neck stiffness.  Skin: Negative.  Negative  for color change, pallor, rash and wound.  Allergic/Immunologic: Negative for environmental allergies, food allergies and immunocompromised state.  Neurological: Negative for dizziness, tremors, seizures, syncope, facial asymmetry, speech difficulty, weakness, light-headedness, numbness and headaches.  Hematological: Negative for adenopathy. Does not  bruise/bleed easily.  Psychiatric/Behavioral: Negative for agitation, behavioral problems, confusion, decreased concentration, dysphoric mood, hallucinations, self-injury, sleep disturbance and suicidal ideas. The patient is not nervous/anxious and is not hyperactive.      Objective: Vital Signs: BP (!) 187/70 (BP Location: Left Arm, Patient Position: Sitting)   Pulse 79   Ht 4\' 11"  (1.499 m)   Wt 174 lb 11.2 oz (79.2 kg)   BMI 35.29 kg/m   Physical Exam Constitutional:      Appearance: She is well-developed.  HENT:     Head: Normocephalic and atraumatic.  Eyes:     Pupils: Pupils are equal, round, and reactive to light.  Pulmonary:     Effort: Pulmonary effort is normal.     Breath sounds: Normal breath sounds.  Abdominal:     General: Bowel sounds are normal.     Palpations: Abdomen is soft.  Musculoskeletal:     Cervical back: Normal range of motion and neck supple.  Skin:    General: Skin is warm and dry.  Neurological:     Mental Status: She is alert and oriented to person, place, and time.  Psychiatric:        Behavior: Behavior normal.        Thought Content: Thought content normal.        Judgment: Judgment normal.     Left Hip Exam   Tenderness  The patient is experiencing no tenderness.   Range of Motion  Abduction:  30 abnormal  Adduction:  15 abnormal  Extension:  0 abnormal  Flexion:  110 abnormal  External rotation:  20 abnormal  Internal rotation: 5 abnormal   Muscle Strength  Abduction: 5/5  Adduction: 5/5  Flexion: 5/5   Tests  FABER: negative Ober: negative      Specialty Comments:  No specialty comments available.  Imaging: XR HIP UNILAT W OR W/O PELVIS 2-3 VIEWS LEFT  Result Date: 11/24/2019 AP pelvis and lateral radiograph pelvis and left hip show severe left hip joint line narrowing with no joint line. There are subchondral cysts left superior femoral head.subchondral sclerosis of the left femoral head.    PMFS  History: Patient Active Problem List   Diagnosis Date Noted  . Pacemaker 09/15/2019  . Sinus node dysfunction (HCC)   . Chest pain 08/20/2019  . Elevated troponin   . Dyspnea 01/03/2016  . Anxiety attack 01/03/2016  . PAF (paroxysmal atrial fibrillation) ([place]) 01/03/2016  . Aphasia   . Other hyperlipidemia   . NSTEMI (non-ST elevated myocardial infarction) ([place]) 04/30/2015  . Hypertension, uncontrolled 07/19/2014  . H/O mitral valve repair   . CAD (coronary artery disease) of artery bypass graft, s/p PCI stent to VG->RCA 04/30/15 07/14/2013  . Obesity (BMI 30-39.9) 07/14/2013  . Hyperlipidemia 07/14/2013  . Hypothyroidism 07/14/2013  . Hypertensive heart disease 07/14/2013   Past Medical History:  Diagnosis Date  . Arthritis   . CAD (coronary artery disease), native coronary artery 07/14/2013   Catheterization August/2003 showing severe three-vessel disease and moderate mitral regurgitation 08/28/2001 CABG with internal mammary graft to LAD, vein graft to circumflex, and vein graft to posterolateral branch and mitral valve repair by Dr. Roxy Manns Inferior infarction 07/12/13 with occlusion of the vein graft to right coronary artery,  stenting with a 3.518 mm resolute stents. Patent internal mammary graft with competitive flow in the distal vessel, occluded right coronary artery, occluded circumflex, patent saphenous vein graft to circumflex with mild/moderate disease proximally, minimal LV damage   . Chronic diastolic CHF (congestive heart failure) (HCC)    a. EF 60% by LV gram 01/2009.  . Coronary artery disease    a. s/p MI in 1980;  b. 2003 CABG x 3 (LIMA->LAD, VG->OM, VG->RPL);  c. 01/2009 Cath: 3/3 patent grafts, native 3VD, EF 60%.  Marland Kitchen GERD (gastroesophageal reflux disease)   . H/O mitral valve repair    a. 2003  . Hyperlipidemia 07/14/2013   Intolerance to multiple statins including Crestor, pravastatin, and Lipitor   . Hypertension   . Hypothyroidism   . NSTEMI (non-ST elevated  myocardial infarction) ([place])   . PAF (paroxysmal atrial fibrillation) ([place])   . S/P partial hysterectomy 1977    Family History  Problem Relation Age of Onset  . Dementia Mother   . Heart Problems Father   . Dementia Sister   . Heart Problems Sister   . Other Other        no premature CAD.  . Tuberculosis Maternal Grandmother   . Heart Problems Paternal Grandfather     Past Surgical History:  Procedure Laterality Date  . ABDOMINAL HYSTERECTOMY  1977  . APPENDECTOMY  1950  . BLADDER SUSPENSION     tac  . CARDIAC CATHETERIZATION  03,05,11  . CARDIAC CATHETERIZATION N/A 04/30/2015   Procedure: Left Heart Cath and Cors/Grafts Angiography;  Surgeon: Belva Crome, MD;  Location: [facility];  Service: Cardiovascular;  Laterality: N/A;  . CARDIAC CATHETERIZATION N/A 04/30/2015   Procedure: Coronary Stent Intervention;  Surgeon: Belva Crome, MD;  Location: [facility];  Service: Cardiovascular;  Laterality: N/A;  Proximal and Distal SVG to the PLA  . CARDIAC CATHETERIZATION N/A 01/06/2016   Procedure: Left Heart Cath and Cors/Grafts Angiography;  Surgeon: Jettie Booze, MD;  Location: [facility];  Service: Cardiovascular;  Laterality: N/A;  . CARPAL TUNNEL RELEASE     right and left  . CHOLECYSTECTOMY    . CORONARY ARTERY BYPASS GRAFT  2003  . KNEE ARTHROSCOPY     left  . LEFT HEART CATHETERIZATION WITH CORONARY ANGIOGRAM N/A 07/12/2013   Procedure: LEFT HEART CATHETERIZATION WITH CORONARY ANGIOGRAM;  Surgeon: Jettie Booze, MD;  Location: [facility];  Service: Cardiovascular;  Laterality: N/A;  . MITRAL VALVE REPAIR  2003   with cabg  . PACEMAKER IMPLANT N/A 08/22/2019   Procedure: PACEMAKER IMPLANT;  Surgeon: Constance Haw, MD;  Location: [facility];  Service: Cardiovascular;  Laterality: N/A;  . THYROIDECTOMY  1968  . THYROIDECTOMY, PARTIAL     left   Social History   Occupational History  . Not on file  Tobacco Use  .  Smoking status: Former Smoker    Quit date: 06/06/1967    Years since quitting: 52.5  . Smokeless tobacco: Former Network engineer  . Vaping Use: Never used  Substance and Sexual Activity  . Alcohol use: No  . Drug use: No  . Sexual activity: Not Currently

## 2019-11-24 NOTE — Patient Instructions (Signed)
Plan: Left hip is suffering from osteoarthritis, only real proven treatments are Do not take arthritis medications like motrin, naprosyn or alleve. Well padded shoes help. Ice the hip that is suffering from osteoarthritis, only real proven treatments are  Well padded shoes help. Ice the hip2-3 times a day 15-20 mins at a time.-3 times a day 15-20 mins at a time. Hot showers in the AM.  Injection with steroid may be of benefit. Hemp CBD capsules, amazon.com 5,000-7,000 mg per bottle, 60 capsules per bottle, take one capsule twice a day. Start gabapentin for night pain 100 mg at night Medrol taper low dose 5 mg or one whole tablet daily for 2 weeks then 1/2 tablet daily till out. Schedule appointment to see Dr. Ninfa Linden for scheduling a left total hip replacement. . Follow-Up Instructions: No follow-ups on file.

## 2019-11-26 ENCOUNTER — Ambulatory Visit: Payer: Medicare Other | Admitting: Cardiology

## 2019-11-26 ENCOUNTER — Other Ambulatory Visit: Payer: Self-pay

## 2019-11-26 ENCOUNTER — Encounter: Payer: Self-pay | Admitting: Cardiology

## 2019-11-26 VITALS — BP 144/68 | HR 82 | Ht 59.0 in | Wt 175.0 lb

## 2019-11-26 DIAGNOSIS — Z950 Presence of cardiac pacemaker: Secondary | ICD-10-CM

## 2019-11-26 DIAGNOSIS — I480 Paroxysmal atrial fibrillation: Secondary | ICD-10-CM | POA: Diagnosis not present

## 2019-11-26 DIAGNOSIS — I495 Sick sinus syndrome: Secondary | ICD-10-CM

## 2019-11-26 LAB — CUP PACEART INCLINIC DEVICE CHECK
Battery Remaining Longevity: 152 mo
Battery Voltage: 3.18 V
Brady Statistic AP VP Percent: 0.07 %
Brady Statistic AP VS Percent: 79.49 %
Brady Statistic AS VP Percent: 0.03 %
Brady Statistic AS VS Percent: 20.41 %
Brady Statistic RA Percent Paced: 79.59 %
Brady Statistic RV Percent Paced: 0.1 %
Date Time Interrogation Session: 20211110152018
Implantable Lead Implant Date: 20210806
Implantable Lead Implant Date: 20210806
Implantable Lead Location: 753859
Implantable Lead Location: 753860
Implantable Lead Model: 5076
Implantable Lead Model: 5076
Implantable Pulse Generator Implant Date: 20210806
Lead Channel Impedance Value: 323 Ohm
Lead Channel Impedance Value: 475 Ohm
Lead Channel Impedance Value: 513 Ohm
Lead Channel Impedance Value: 589 Ohm
Lead Channel Pacing Threshold Amplitude: 0.5 V
Lead Channel Pacing Threshold Amplitude: 0.75 V
Lead Channel Pacing Threshold Pulse Width: 0.4 ms
Lead Channel Pacing Threshold Pulse Width: 0.4 ms
Lead Channel Sensing Intrinsic Amplitude: 12.625 mV
Lead Channel Sensing Intrinsic Amplitude: 16.5 mV
Lead Channel Sensing Intrinsic Amplitude: 2.5 mV
Lead Channel Sensing Intrinsic Amplitude: 2.875 mV
Lead Channel Setting Pacing Amplitude: 2 V
Lead Channel Setting Pacing Amplitude: 2.5 V
Lead Channel Setting Pacing Pulse Width: 0.4 ms
Lead Channel Setting Sensing Sensitivity: 1.2 mV

## 2019-11-26 NOTE — Patient Instructions (Addendum)
Medication Instructions:  Your physician recommends that you continue on your current medications as directed. Please refer to the Current Medication list given to you today. *If you need a refill on your cardiac medications before your next appointment, please call your pharmacy*  Lab Work: None ordered. If you have labs (blood work) drawn today and your tests are completely normal, you will receive your results only by: Marland Kitchen MyChart Message (if you have MyChart) OR . A paper copy in the mail If you have any lab test that is abnormal or we need to change your treatment, we will call you to review the results.  Testing/Procedures: None ordered.  Follow-Up: At [facility], you and your health needs are our priority.  As part of our continuing mission to provide you with exceptional heart care, we have created designated Provider Care Teams.  These Care Teams include your primary Cardiologist (physician) and Advanced Practice Providers (APPs -  Physician Assistants and Nurse Practitioners) who all work together to provide you with the care you need, when you need it.  We recommend signing up for the patient portal called "MyChart".  Sign up information is provided on this After Visit Summary.  MyChart is used to connect with patients for Virtual Visits (Telemedicine).  Patients are able to view lab/test results, encounter notes, upcoming appointments, etc.  Non-urgent messages can be sent to your provider as well.   To learn more about what you can do with MyChart, go to [email or web address].    Your next appointment:   Your physician wants you to follow-up in: 6 months with Dr. Quentin Ore.   You will receive a reminder letter in the mail two months in advance. If you don't receive a letter, please call our office to schedule the follow-up appointment.  Remote monitoring is used to monitor your Pacemaker from home. This monitoring reduces the number of office visits required to check your device  to one time per year. It allows [place] to keep an eye on the functioning of your device to ensure it is working properly. You are scheduled for a device check from home on 02/20/2020. You may send your transmission at any time that day. If you have a wireless device, the transmission will be sent automatically. After your physician reviews your transmission, you will receive a postcard with your next transmission date.

## 2019-11-26 NOTE — Progress Notes (Signed)
Electrophysiology Office Follow up Visit Note:    Date:  11/26/2019   ID:  [name], DOB Oct 07, 1930, MRN [id number]  PCP:  Leonard Downing, MD  [place] Cardiologist:  Shirlee More, MD  [facility] Electrophysiologist:  Vickie Epley, MD    Interval History:    Heather [name] is a 86 y.o. female who presents for a follow up visit regarding her dual-chamber pacemaker and sinus node dysfunction.  Patient tells me she has been doing well.  She is working with an orthopedic surgeon regarding possible hip replacement surgery.  Her hip pain is her main limiting factor for her mobility.    Past Medical History:  Diagnosis Date  . Arthritis   . CAD (coronary artery disease), native coronary artery 07/14/2013   Catheterization August/2003 showing severe three-vessel disease and moderate mitral regurgitation 08/28/2001 CABG with internal mammary graft to LAD, vein graft to circumflex, and vein graft to posterolateral branch and mitral valve repair by Dr. 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 Patent internal mammary graft with competitive flow in the distal vessel, occluded right coronary artery, occluded circumflex, patent saphenous vein graft to circumflex with mild/moderate disease proximally, minimal LV damage   . Chronic diastolic CHF (congestive heart failure) (HCC)    a. EF 60% by LV gram 01/2009.  . Coronary artery disease    a. s/p MI in 1980;  b. 2003 CABG x 3 (LIMA->LAD, VG->OM, VG->RPL);  c. 01/2009 Cath: 3/3 patent grafts, native 3VD, EF 60%.  Marland Kitchen GERD (gastroesophageal reflux disease)   . H/O mitral valve repair    a. 2003  . Hyperlipidemia 07/14/2013   Intolerance to multiple statins including Crestor, pravastatin, and Lipitor   . Hypertension   . Hypothyroidism   . NSTEMI (non-ST elevated myocardial infarction) ([place])   . PAF (paroxysmal atrial fibrillation) ([place])    . S/P partial hysterectomy 1977    Past Surgical History:  Procedure Laterality Date  . ABDOMINAL HYSTERECTOMY  1977  . APPENDECTOMY  1950  . BLADDER SUSPENSION     tac  . CARDIAC CATHETERIZATION  03,05,11  . CARDIAC CATHETERIZATION N/A 04/30/2015   Procedure: Left Heart Cath and Cors/Grafts Angiography;  Surgeon: Belva Crome, MD;  Location: [facility];  Service: Cardiovascular;  Laterality: N/A;  . CARDIAC CATHETERIZATION N/A 04/30/2015   Procedure: Coronary Stent Intervention;  Surgeon: Belva Crome, MD;  Location: [facility];  Service: Cardiovascular;  Laterality: N/A;  Proximal and Distal SVG to the PLA  . CARDIAC CATHETERIZATION N/A 01/06/2016   Procedure: Left Heart Cath and Cors/Grafts Angiography;  Surgeon: Jettie Booze, MD;  Location: [facility];  Service: Cardiovascular;  Laterality: N/A;  . CARPAL TUNNEL RELEASE     right and left  . CHOLECYSTECTOMY    . CORONARY ARTERY BYPASS GRAFT  2003  . KNEE ARTHROSCOPY     left  . LEFT HEART CATHETERIZATION WITH CORONARY ANGIOGRAM N/A 07/12/2013   Procedure: LEFT HEART CATHETERIZATION WITH CORONARY ANGIOGRAM;  Surgeon: Jettie Booze, MD;  Location: [facility]ATH LAB;  Service: Cardiovascular;  Laterality: N/A;  . MITRAL VALVE REPAIR  2003   with cabg  . PACEMAKER IMPLANT N/A 08/22/2019   Procedure: PACEMAKER IMPLANT;  Surgeon: Constance Haw, MD;  Location: [facility];  Service: Cardiovascular;  Laterality: N/A;  . THYROIDECTOMY  1968  . THYROIDECTOMY, PARTIAL  left    Current Medications: Current Meds  Medication Sig  . acetaminophen (TYLENOL) 500 MG tablet Take 500 mg by mouth every 6 (six) hours as needed for mild pain or moderate pain.   Marland Kitchen amiodarone (PACERONE) 200 MG tablet Take 2 tablets by mouth for 7 days.  Then decrease to one tablet by mouth daily. (Patient taking differently: 200 mg daily. Take 2 tablets by mouth for 7 days.  Then decrease to one tablet by mouth daily.)    . amLODipine (NORVASC) 2.5 MG tablet TAKE 1 TABLET BY MOUTH ONCE DAILY NEEDS  APPOINTMENT  WITH  CARDIOLOGIST (Patient taking differently: Take 2.5 mg by mouth daily. )  . apixaban (ELIQUIS) 5 MG TABS tablet Take 1 tablet (5 mg total) by mouth 2 (two) times daily.  . cholecalciferol (VITAMIN D3) 25 MCG (1000 UNIT) tablet Take 1,000 Units by mouth daily.  . clopidogrel (PLAVIX) 75 MG tablet Take 1 tablet (75 mg total) by mouth daily.  Marland Kitchen ezetimibe (ZETIA) 10 MG tablet Take 1 tablet (10 mg total) by mouth daily.  . famotidine (PEPCID) 10 MG tablet Take 10 mg by mouth daily as needed for heartburn or indigestion.  . gabapentin (NEURONTIN) 100 MG capsule Take 1 capsule (100 mg total) by mouth at bedtime.  Marland Kitchen levothyroxine (SYNTHROID, LEVOTHROID) 50 MCG tablet Take 50 mcg by mouth daily.  . nitroGLYCERIN (NITROSTAT) 0.4 MG SL tablet Place 0.4 mg under the tongue every 5 (five) minutes as needed for chest pain.     Allergies:   Atorvastatin, Codeine, Crestor [rosuvastatin], Pravastatin, Simvastatin, and Ticagrelor   Social History   Socioeconomic History  . Marital status: Widowed    Spouse name: Not on file  . Number of children: Not on file  . Years of education: Not on file  . Highest education level: Not on file  Occupational History  . Not on file  Tobacco Use  . Smoking status: Former Smoker    Quit date: 06/06/1967    Years since quitting: 52.5  . Smokeless tobacco: Former Network engineer  . Vaping Use: Never used  Substance and Sexual Activity  . Alcohol use: No  . Drug use: No  . Sexual activity: Not Currently  Other Topics Concern  . Not on file  Social History Narrative   Lives in [place].  Widowed.   Social Determinants of Health   Financial Resource Strain:   . Difficulty of Paying Living Expenses: Not on file  Food Insecurity:   . Worried About Charity fundraiser in the Last Year: Not on file  . Ran Out of Food in the Last Year: Not on file  Transportation  Needs:   . Lack of Transportation (Medical): Not on file  . Lack of Transportation (Non-Medical): Not on file  Physical Activity:   . Days of Exercise per Week: Not on file  . Minutes of Exercise per Session: Not on file  Stress:   . Feeling of Stress : Not on file  Social Connections:   . Frequency of Communication with Friends and Family: Not on file  . Frequency of Social Gatherings with Friends and Family: Not on file  . Attends Religious Services: Not on file  . Active Member of Clubs or Organizations: Not on file  . Attends Archivist Meetings: Not on file  . Marital Status: Not on file     Family History: The patient's family history includes Dementia in her mother and sister; Heart Problems  in her father, paternal grandfather, and sister; Other in an other family member; Tuberculosis in her maternal grandmother.  ROS:   Please see the history of present illness.    All other systems reviewed and are negative.  EKGs/Labs/Other Studies Reviewed:    The following studies were reviewed today: Prior notes, and person device interrogation  November 26, 2019 device interrogation personally reviewed longevity 15.8 years AAIR to DDDR 60-1 10 atrial lead impedance 475 ohms, capture threshold 0.65 at 0.4, P waves 2.5 mV ventricular lead impedance 589 ohms, capture threshold 0.50.4, R wave 12.6 mV atrial pacing 79.6% of the time, ventricular pacing less than 0.1% of the time  EKG:  The ekg ordered today demonstrates atrial pacing ventricular sensing with a right bundle branch block pattern in lead V1  Recent Labs: 08/20/2019: B Natriuretic Peptide 146.8 09/15/2019: BUN 9; Creatinine, Ser 0.79; Hemoglobin 11.9; Magnesium 2.0; Platelets 224; Potassium 4.4; Sodium 139; TSH 1.730  Recent Lipid Panel    Component Value Date/Time   CHOL 180 04/30/2015 0548   TRIG 249 (H) 04/30/2015 0548   HDL 27 (L) 04/30/2015 0548   CHOLHDL 6.7 04/30/2015 0548   VLDL 50 (H) 04/30/2015  0548   LDLCALC 103 (H) 04/30/2015 0548    Physical Exam:    VS:  BP (!) 144/68   Pulse 82   Ht 4\' 11"  (1.499 m)   Wt 175 lb (79.4 kg)   SpO2 97%   BMI 35.35 kg/m     Wt Readings from Last 3 Encounters:  11/26/19 175 lb (79.4 kg)  11/24/19 174 lb 11.2 oz (79.2 kg)  09/17/19 174 lb 9.6 oz (79.2 kg)     GEN:  Well nourished, well developed in no acute distress HEENT: Normal NECK: No JVD; No carotid bruits LYMPHATICS: No lymphadenopathy CARDIAC: RRR, no murmurs, rubs, gallops.  Pacemaker site well-healed without pain to the pocket RESPIRATORY:  Clear to auscultation without rales, wheezing or rhonchi  ABDOMEN: Soft, non-tender, non-distended MUSCULOSKELETAL:  No edema; No deformity  SKIN: Warm and dry NEUROLOGIC:  Alert and oriented x 3 PSYCHIATRIC:  Normal affect   ASSESSMENT:    1. Sinus node dysfunction (HCC)   2. Pacemaker   3. PAF (paroxysmal atrial fibrillation) (HCC)    PLAN:    In order of problems listed above:  1. Sinus node dysfunction post pacemaker implant Device functioning well.  Lead parameters stable.  Atrially pacing nearly 80% of the time.  MVP effective in minimizing ventricular pacing.  Changed atrial lead monitoring to adaptive, changed RV pacing amplitude to 2.5 from 3.5.  RV lead monitoring changed to adaptive.  2.  Atrial fibrillation continue Eliquis and amiodarone   Follow-up 6 months   Medication Adjustments/Labs and Tests Ordered: Current medicines are reviewed at length with the patient today.  Concerns regarding medicines are outlined above.  Orders Placed This Encounter  Procedures  . EKG 12-Lead   No orders of the defined types were placed in this encounter.    Signed, Lars Mage, MD, [facility]  11/26/2019 3:13 PM    Electrophysiology [facility]

## 2019-11-28 ENCOUNTER — Telehealth: Payer: Self-pay | Admitting: Cardiology

## 2019-11-28 NOTE — Telephone Encounter (Signed)
Pt c/o medication issue:  1. Name of Medication: Methylprednisolone 4mg    2. How are you currently taking this medication (dosage and times per day)? Will start taking tomorrow, 1 tablet daily for 14 days & 1/2 tablet daily for 14 days  3. Are you having a reaction (difficulty breathing--STAT)? No   4. What is your medication issue? Heather [name] is calling stating this medication was removed from her med list at her last appt due to her being unaware it was coming through mail order. Bellany states she received it in the mail today and will begin taking tomorrow.

## 2019-12-10 DIAGNOSIS — I2510 Atherosclerotic heart disease of native coronary artery without angina pectoris: Secondary | ICD-10-CM | POA: Insufficient documentation

## 2019-12-10 DIAGNOSIS — I10 Essential (primary) hypertension: Secondary | ICD-10-CM | POA: Insufficient documentation

## 2019-12-10 DIAGNOSIS — M1990 Unspecified osteoarthritis, unspecified site: Secondary | ICD-10-CM | POA: Insufficient documentation

## 2019-12-10 DIAGNOSIS — K219 Gastro-esophageal reflux disease without esophagitis: Secondary | ICD-10-CM | POA: Insufficient documentation

## 2019-12-10 DIAGNOSIS — I5032 Chronic diastolic (congestive) heart failure: Secondary | ICD-10-CM | POA: Insufficient documentation

## 2019-12-15 ENCOUNTER — Ambulatory Visit: Payer: Medicare Other | Admitting: Orthopaedic Surgery

## 2019-12-15 ENCOUNTER — Encounter: Payer: Self-pay | Admitting: Orthopaedic Surgery

## 2019-12-15 DIAGNOSIS — M1612 Unilateral primary osteoarthritis, left hip: Secondary | ICD-10-CM

## 2019-12-15 DIAGNOSIS — M25552 Pain in left hip: Secondary | ICD-10-CM

## 2019-12-15 NOTE — Progress Notes (Signed)
Office Visit Note   Patient: Heather [name]           Date of Birth: [birth year]           MRN: [id number] Visit Date: 12/15/2019              Requested by: Leonard Downing, MD [place],  [place] [id number] PCP: Leonard Downing, MD   Assessment & Plan: Visit Diagnoses:  1. Unilateral primary osteoarthritis, left hip   2. Pain in left hip     Plan: Based on her clinical exam and x-ray findings I do agree with her having the need for a hip replacement for her left hip.  I went over hip replacement model and described in detail what the surgery involves.  I talked about the risk and benefits of surgery and described the interoperative postoperative course.  She will need cardiac clearance for the surgery.  Also she will need to stop Plavix 1 week before surgery and Eliquis 4 days before surgery so a spinal anesthesia can be considered.  All question concerns were answered and addressed.  I gave them a handout about hip replacement surgery and would like to reevaluate her in 3 weeks to go over her cardiac findings and recommendations as well as work on potentially scheduling the surgery for after the first of the year.  Follow-Up Instructions: Return in about 3 weeks (around 01/05/2020).   Orders:  No orders of the defined types were placed in this encounter.  No orders of the defined types were placed in this encounter.     Procedures: No procedures performed   Clinical Data: No additional findings.   Subjective: Chief Complaint  Patient presents with  . Left Hip - Pain  The patient is a very pleasant 86 year old female sent to me by Dr. Louanne Skye to evaluate and treat severe end-stage arthritis of her left hip.  She mainly gets around with a walker or a wheelchair due to the severe pain she is experiencing at left hip for now over a year.  X-rays do confirm severe end-stage arthritis of the left hip.  She would like to proceed with a hip replacement if  possible after the first of the year.  Her pain is daily and it is 10 out of 10 with that left hip.  At this point her left hip pain is definitely affecting her mobility, her quality of life and activities day living.  She is a regular patient of [facility].  She is on Plavix and Eliquis.  She does have a pacemaker as well.  Apparently she is being seen this coming Friday is regular visit and will discuss cardiac clearance.  HPI  Review of Systems Today she denies any headache, chest pain, shortness of breath, fever, chills, nausea, vomiting  Objective: Vital Signs: There were no vitals taken for this visit.  Physical Exam She is alert and orient x3 and in no acute distress Ortho Exam Examination of her right hip shows it moves smoothly and fluidly.  Examination of her left hip shows severe pain with attempts of internal and external rotation.  There is also significant stiffness with rotation. Specialty Comments:  No specialty comments available.  Imaging: No results found. An AP pelvis and lateral of the left hip that are independently reviewed show severe end-stage arthritis of left hip.  There is complete loss of the joint space with flattening of the femoral head.  There is cystic changes  in the femoral head and acetabulum.  PMFS History: Patient Active Problem List   Diagnosis Date Noted  . Hypertension   . GERD (gastroesophageal reflux disease)   . Coronary artery disease   . Chronic diastolic CHF (congestive heart failure) ([place])   . Arthritis   . Pacemaker 09/15/2019  . Sinus node dysfunction (HCC)   . Chest pain 08/20/2019  . Elevated troponin   . Dyspnea 01/03/2016  . Anxiety attack 01/03/2016  . PAF (paroxysmal atrial fibrillation) ([place]) 01/03/2016  . Aphasia   . Other hyperlipidemia   . NSTEMI (non-ST elevated myocardial infarction) ([place]) 04/30/2015  . Hypertension, uncontrolled 07/19/2014  . H/O mitral valve repair   . CAD (coronary artery disease) of artery  bypass graft, s/p PCI stent to VG->RCA 04/30/15 07/14/2013  . Obesity (BMI 30-39.9) 07/14/2013  . Hyperlipidemia 07/14/2013  . Hypothyroidism 07/14/2013  . Hypertensive heart disease 07/14/2013  . CAD (coronary artery disease), native coronary artery 07/14/2013  . S/P partial hysterectomy 1977   Past Medical History:  Diagnosis Date  . Anxiety attack 01/03/2016  . Aphasia   . Arthritis   . CAD (coronary artery disease) of artery bypass graft, s/p PCI stent to VG->RCA 04/30/15 07/14/2013   Catheterization August/2003 showing severe three-vessel disease and moderate mitral regurgitation 08/28/2001 CABG with internal mammary graft to LAD, vein graft to circumflex, and vein graft to posterolateral branch and mitral valve repair by Dr. 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 Patent internal ma  . CAD (coronary artery disease), native coronary artery 07/14/2013   Catheterization August/2003 showing severe three-vessel disease and moderate mitral regurgitation 08/28/2001 CABG with internal mammary graft to LAD, vein graft to circumflex, and vein graft to posterolateral branch and mitral valve repair by Dr. 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 Patent internal mammary graft with competitive flow in the distal vessel, occluded right coronary artery, occluded circumflex, patent saphenous vein graft to circumflex with mild/moderate disease proximally, minimal LV damage   . Chest pain 08/20/2019  . Chronic diastolic CHF (congestive heart failure) (HCC)    a. EF 60% by LV gram 01/2009.  . Coronary artery disease    a. s/p MI in 1980;  b. 2003 CABG x 3 (LIMA->LAD, VG->OM, VG->RPL);  c. 01/2009 Cath: 3/3 patent grafts, native 3VD, EF 60%.  Marland Kitchen Dyspnea 01/03/2016  . Elevated troponin   . GERD (gastroesophageal reflux disease)   .  H/O mitral valve repair    a. 2003  . Hyperlipidemia 07/14/2013   Intolerance to multiple statins including Crestor, pravastatin, and Lipitor   . Hypertension   . Hypertension, uncontrolled 07/19/2014  . Hypertensive heart disease 07/14/2013  . Hypothyroidism   . NSTEMI (non-ST elevated myocardial infarction) ([place])   . Other hyperlipidemia   . PAF (paroxysmal atrial fibrillation) ([place])   . S/P partial hysterectomy 1977  . Sinus node dysfunction (HCC)     Family History  Problem Relation Age of Onset  . Dementia Mother   . Heart Problems Father   . Dementia Sister   . Heart Problems Sister   . Other Other        no premature CAD.  . Tuberculosis Maternal Grandmother   . Heart Problems Paternal Grandfather     Past Surgical History:  Procedure Laterality Date  . ABDOMINAL HYSTERECTOMY  1977  .  APPENDECTOMY  1950  . BLADDER SUSPENSION     tac  . CARDIAC CATHETERIZATION  03,05,11  . CARDIAC CATHETERIZATION N/A 04/30/2015   Procedure: Left Heart Cath and Cors/Grafts Angiography;  Surgeon: Belva Crome, MD;  Location: [facility];  Service: Cardiovascular;  Laterality: N/A;  . CARDIAC CATHETERIZATION N/A 04/30/2015   Procedure: Coronary Stent Intervention;  Surgeon: Belva Crome, MD;  Location: [facility];  Service: Cardiovascular;  Laterality: N/A;  Proximal and Distal SVG to the PLA  . CARDIAC CATHETERIZATION N/A 01/06/2016   Procedure: Left Heart Cath and Cors/Grafts Angiography;  Surgeon: Jettie Booze, MD;  Location: [facility];  Service: Cardiovascular;  Laterality: N/A;  . CARPAL TUNNEL RELEASE     right and left  . CHOLECYSTECTOMY    . CORONARY ARTERY BYPASS GRAFT  2003  . KNEE ARTHROSCOPY     left  . LEFT HEART CATHETERIZATION WITH CORONARY ANGIOGRAM N/A 07/12/2013   Procedure: LEFT HEART CATHETERIZATION WITH CORONARY ANGIOGRAM;  Surgeon: Jettie Booze, MD;  Location: [facility];  Service: Cardiovascular;  Laterality: N/A;  . MITRAL  VALVE REPAIR  2003   with cabg  . PACEMAKER IMPLANT N/A 08/22/2019   Procedure: PACEMAKER IMPLANT;  Surgeon: Constance Haw, MD;  Location: [place] [facility];  Service: Cardiovascular;  Laterality: N/A;  . THYROIDECTOMY  1968  . THYROIDECTOMY, PARTIAL     left   Social History   Occupational History  . Not on file  Tobacco Use  . Smoking status: Former Smoker    Quit date: 06/06/1967    Years since quitting: 52.5  . Smokeless tobacco: Former Network engineer  . Vaping Use: Never used  Substance and Sexual Activity  . Alcohol use: No  . Drug use: No  . Sexual activity: Not Currently

## 2019-12-19 ENCOUNTER — Other Ambulatory Visit: Payer: Self-pay

## 2019-12-19 ENCOUNTER — Ambulatory Visit: Payer: Medicare Other | Admitting: Cardiology

## 2019-12-19 ENCOUNTER — Encounter: Payer: Self-pay | Admitting: Cardiology

## 2019-12-19 VITALS — BP 164/70 | HR 77 | Ht 59.0 in | Wt 171.2 lb

## 2019-12-19 DIAGNOSIS — I110 Hypertensive heart disease with heart failure: Secondary | ICD-10-CM

## 2019-12-19 DIAGNOSIS — I5032 Chronic diastolic (congestive) heart failure: Secondary | ICD-10-CM

## 2019-12-19 DIAGNOSIS — Z9889 Other specified postprocedural states: Secondary | ICD-10-CM

## 2019-12-19 DIAGNOSIS — Z79899 Other long term (current) drug therapy: Secondary | ICD-10-CM

## 2019-12-19 DIAGNOSIS — I480 Paroxysmal atrial fibrillation: Secondary | ICD-10-CM | POA: Diagnosis not present

## 2019-12-19 DIAGNOSIS — I25708 Atherosclerosis of coronary artery bypass graft(s), unspecified, with other forms of angina pectoris: Secondary | ICD-10-CM

## 2019-12-19 DIAGNOSIS — I495 Sick sinus syndrome: Secondary | ICD-10-CM

## 2019-12-19 DIAGNOSIS — Z7901 Long term (current) use of anticoagulants: Secondary | ICD-10-CM

## 2019-12-19 DIAGNOSIS — Z01810 Encounter for preprocedural cardiovascular examination: Secondary | ICD-10-CM | POA: Diagnosis not present

## 2019-12-19 DIAGNOSIS — E782 Mixed hyperlipidemia: Secondary | ICD-10-CM

## 2019-12-19 DIAGNOSIS — Z950 Presence of cardiac pacemaker: Secondary | ICD-10-CM

## 2019-12-19 NOTE — Patient Instructions (Signed)

## 2019-12-19 NOTE — Progress Notes (Signed)
Cardiology Office Note:    Date:  12/19/2019   ID:  Heather [name], DOB [birth year], MRN [id number]  PCP:  Leonard Downing, MD  Cardiologist:  Shirlee More, MD    Referring MD: Leonard Downing, *    ASSESSMENT:    1. Preoperative cardiovascular examination   2. Coronary artery disease of bypass graft of native heart with stable angina pectoris ([place])   3. H/O mitral valve repair   4. Hypertensive heart disease with chronic diastolic congestive heart failure ([place])   5. PAF (paroxysmal atrial fibrillation) ([place])   6. On amiodarone therapy   7. Chronic anticoagulation   8. Sinus node dysfunction (HCC)   9. Pacemaker   10. Mixed hyperlipidemia    PLAN:    In order of problems listed above:  1. Her procedure is intermediate risk elective.  Her major cardiovascular issues include the pacemaker and combined antiplatelet anticoagulant therapy.  Her congestive heart failure is compensated coronary artery disease is stable hypertension and dyslipidemia are treated at target and she remains in sinus rhythm on amiodarone.  My perspective she is optimized for the planned procedure.  She will need to withdraw clopidogrel 5 to 7 days preoperatively and last the surgery and resume postoperatively when he thinks it stable.  She will need to be off her anticoagulant 3 full days prior to surgery and the like to see that resume first postoperatively 24 to 48 hours afterwards.  He should go to a monitored bed to monitor for recurrent atrial fibrillation and continue her usual cardiac meds including amiodarone.  Intraoperatively if there is a concern for pacemaker EMI smart magnet is appropriate.  Knowing the location of the surgical field and the fact that she is not pacemaker dependent I do not think will have a problem.  I do not think she requires any further preoperative cardiac testing with the results of her echocardiogram this year quite reassuring 2. Stable CAD New [place] Heart Association  class I continue medical treatment 3. She has had a very good durable response to mitral valve surgery with no evidence of recurrent regurgitation 4. Hypertension stable heart failure compensated continue current treatment 5. Maintaining sinus rhythm on amiodarone continue perioperatively 6. See discussion above regarding withdrawal of antithrombotic repeat perioperatively 7. Stable pacemaker function any concerns with smart magnet in the operating room as appropriate 8. Continue her statin   Next appointment: 6 months   Medication Adjustments/Labs and Tests Ordered: Current medicines are reviewed at length with the patient today.  Concerns regarding medicines are outlined above.  Orders Placed This Encounter  Procedures  . EKG 12-Lead   No orders of the defined types were placed in this encounter.   Chief Complaint  Patient presents with  . Follow-up  . Coronary Artery Disease  . Atrial Fibrillation    On amiodarone and she has a pacemaker    History of Present Illness:    Heather [name] is a 86 y.o. female with a hx of CAD with CABG and mitral valve repair in 2003, inferior ST elevation MI 2015 with PCI of the saphenous vein graft to the posterior lateral artery paroxysmal atrial fibrillation hyperlipidemia and statin intolerance as well as heart failure with preserved ejection fraction.  She is followed by device clinic with a permanent pacemaker.  She was last seen 09/17/2019.  Compliance with diet, lifestyle and medications: Yes  08/21/2019 Echo reviewed her ejection fraction remains normal and she has had a good  result  from mitral valve surgery  1. Hypokinesis of the basal inferior and inferolateral wall with overall  preserved LV function; mild LVH; s/p MV repair with mild MS (mean gradient  5 mmHg) and mild MR; mild LAE; mild RV dysfunction.   2. Left ventricular ejection fraction, by estimation, is 55 to 60%. The  left ventricle has normal function. The left  ventricle demonstrates  regional wall motion abnormalities (see scoring diagram/findings for  description). There is mild left ventricular   hypertrophy. Left ventricular diastolic parameters are indeterminate.   3. Right ventricular systolic function is mildly reduced. The right  ventricular size is normal. There is normal pulmonary artery systolic  pressure.   4. Left atrial size was mildly dilated.   5. The mitral valve has been repaired/replaced. Mild mitral valve  regurgitation. Mild mitral stenosis.   6. The aortic valve is tricuspid. Aortic valve regurgitation is not  visualized. Mild to moderate aortic valve sclerosis/calcification is  present, without any evidence of aortic stenosis.   7. The inferior vena cava is normal in size with greater than 50%  respiratory variability, suggesting right atrial pressure of 3 mmHg.   Her most recent device check 11/26/2018 showed normal pacemaker parameters lead function and battery estimated longevity 12.6 years, she had no atrial arrhythmia detected.  EKG performed at device clinic reviewed by me shows atrial paced rhythm with intrinsic nodal conduction right bundle branch block  She is quite affected by her hip pain unable to ambulate and committed to having hip surgery.  Her son is with her.  They understand that we will need to withdraw both clopidogrel and anticoagulant and there is a chance although I think it is quite low that she could have a thrombotic event.  They accept the risk.  Physical ability is limited but she takes care of her self bathing dressing and still does light housework.  Her predominant problem is pain.  She has had no angina shortness of breath chest pain palpitation or syncope.  Her son is present participates in the evaluation and decision making Past Medical History:  Diagnosis Date  . Anxiety attack 01/03/2016  . Aphasia   . Arthritis   . CAD (coronary artery disease) of artery bypass graft, s/p PCI stent to  VG->RCA 04/30/15 07/14/2013   Catheterization August/2003 showing severe three-vessel disease and moderate mitral regurgitation 08/28/2001 CABG with internal mammary graft to LAD, vein graft to circumflex, and vein graft to posterolateral branch and mitral valve repair by Dr. 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 Patent internal ma  . CAD (coronary artery disease), native coronary artery 07/14/2013   Catheterization August/2003 showing severe three-vessel disease and moderate mitral regurgitation 08/28/2001 CABG with internal mammary graft to LAD, vein graft to circumflex, and vein graft to posterolateral branch and mitral valve repair by Dr. 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 Patent internal mammary graft with competitive flow in the distal vessel, occluded right coronary artery, occluded circumflex, patent saphenous vein graft to circumflex with mild/moderate disease proximally, minimal LV damage   . Chest pain 08/20/2019  . Chronic diastolic CHF (congestive heart failure) (HCC)    a. EF 60% by LV gram 01/2009.  . Coronary artery disease    a. s/p MI in 1980;  b. 2003 CABG x 3 (LIMA->LAD, VG->OM, VG->RPL);  c. 01/2009 Cath: 3/3 patent grafts, native 3VD, EF 60%.  Marland Kitchen Dyspnea 01/03/2016  . Elevated troponin   . GERD (gastroesophageal reflux disease)   . H/O mitral valve repair    a. 2003  . Hyperlipidemia 07/14/2013   Intolerance to multiple statins including Crestor, pravastatin, and Lipitor   . Hypertension   . Hypertension, uncontrolled 07/19/2014  . Hypertensive heart disease 07/14/2013  . Hypothyroidism   . NSTEMI (non-ST elevated myocardial infarction) ([place])   . Other hyperlipidemia   . PAF (paroxysmal atrial fibrillation) ([place])   . S/P partial hysterectomy 1977  . Sinus node dysfunction [facility])     Past  Surgical History:  Procedure Laterality Date  . ABDOMINAL HYSTERECTOMY  1977  . APPENDECTOMY  1950  . BLADDER SUSPENSION     tac  . CARDIAC CATHETERIZATION  03,05,11  . CARDIAC CATHETERIZATION N/A 04/30/2015   Procedure: Left Heart Cath and Cors/Grafts Angiography;  Surgeon: Belva Crome, MD;  Location: [facility];  Service: Cardiovascular;  Laterality: N/A;  . CARDIAC CATHETERIZATION N/A 04/30/2015   Procedure: Coronary Stent Intervention;  Surgeon: Belva Crome, MD;  Location: [facility];  Service: Cardiovascular;  Laterality: N/A;  Proximal and Distal SVG to the PLA  . CARDIAC CATHETERIZATION N/A 01/06/2016   Procedure: Left Heart Cath and Cors/Grafts Angiography;  Surgeon: Jettie Booze, MD;  Location: [facility];  Service: Cardiovascular;  Laterality: N/A;  . CARPAL TUNNEL RELEASE     right and left  . CHOLECYSTECTOMY    . CORONARY ARTERY BYPASS GRAFT  2003  . KNEE ARTHROSCOPY     left  . LEFT HEART CATHETERIZATION WITH CORONARY ANGIOGRAM N/A 07/12/2013   Procedure: LEFT HEART CATHETERIZATION WITH CORONARY ANGIOGRAM;  Surgeon: Jettie Booze, MD;  Location[facility];  Service: Cardiovascular;  Laterality: N/A;  . MITRAL VALVE REPAIR  2003   with cabg  . PACEMAKER IMPLANT N/A 08/22/2019   Procedure: PACEMAKER IMPLANT;  Surgeon: Constance Haw, MD;  Location: [place] [facility];  Service: Cardiovascular;  Laterality: N/A;  . THYROIDECTOMY  1968  . THYROIDECTOMY, PARTIAL     left    Current Medications: Current Meds  Medication Sig  . acetaminophen (TYLENOL) 500 MG tablet Take 500 mg by mouth every 6 (six) hours as needed for mild pain or moderate pain.   Marland Kitchen amiodarone (PACERONE) 200 MG tablet Take 2 tablets by mouth for 7 days.  Then decrease to one tablet by mouth daily. (Patient taking differently: 200 mg daily. Take 2 tablets by mouth for 7 days.  Then decrease to one tablet by mouth daily.)  . amLODipine (NORVASC) 2.5 MG tablet TAKE 1  TABLET BY MOUTH ONCE DAILY NEEDS  APPOINTMENT  WITH  CARDIOLOGIST (Patient taking differently: Take 2.5 mg by mouth daily. )  . apixaban (ELIQUIS) 5 MG TABS tablet Take 1 tablet (5 mg total) by mouth 2 (two) times daily.  . cholecalciferol (VITAMIN D3) 25 MCG (1000 UNIT) tablet Take 1,000 Units by mouth daily.  . clopidogrel (PLAVIX) 75 MG tablet Take 1 tablet (75 mg total) by mouth daily.  Marland Kitchen ezetimibe (ZETIA) 10 MG tablet Take 1 tablet (10 mg total) by mouth daily.  . famotidine (PEPCID) 10 MG tablet Take 10 mg by mouth daily as needed for heartburn or indigestion.  . gabapentin (NEURONTIN) 100 MG capsule Take 1 capsule (100 mg total) by mouth at bedtime.  Marland Kitchen levothyroxine (SYNTHROID, LEVOTHROID) 50 MCG tablet Take 50  mcg by mouth daily.  . nitroGLYCERIN (NITROSTAT) 0.4 MG SL tablet Place 0.4 mg under the tongue every 5 (five) minutes as needed for chest pain.     Allergies:   Atorvastatin, Codeine, Crestor [rosuvastatin], Pravastatin, Simvastatin, and Ticagrelor   Social History   Socioeconomic History  . Marital status: Widowed    Spouse name: Not on file  . Number of children: Not on file  . Years of education: Not on file  . Highest education level: Not on file  Occupational History  . Not on file  Tobacco Use  . Smoking status: Former Smoker    Quit date: 06/06/1967    Years since quitting: 52.5  . Smokeless tobacco: Former Network engineer  . Vaping Use: Never used  Substance and Sexual Activity  . Alcohol use: No  . Drug use: No  . Sexual activity: Not Currently  Other Topics Concern  . Not on file  Social History Narrative   Lives in [place].  Widowed.   Social Determinants of Health   Financial Resource Strain:   . Difficulty of Paying Living Expenses: Not on file  Food Insecurity:   . Worried About Charity fundraiser in the Last Year: Not on file  . Ran Out of Food in the Last Year: Not on file  Transportation Needs:   . Lack of Transportation (Medical): Not  on file  . Lack of Transportation (Non-Medical): Not on file  Physical Activity:   . Days of Exercise per Week: Not on file  . Minutes of Exercise per Session: Not on file  Stress:   . Feeling of Stress : Not on file  Social Connections:   . Frequency of Communication with Friends and Family: Not on file  . Frequency of Social Gatherings with Friends and Family: Not on file  . Attends Religious Services: Not on file  . Active Member of Clubs or Organizations: Not on file  . Attends Archivist Meetings: Not on file  . Marital Status: Not on file     Family History: The patient's family history includes Dementia in her mother and sister; Heart Problems in her father, paternal grandfather, and sister; Other in an other family member; Tuberculosis in her maternal grandmother. ROS:   Please see the history of present illness.    All other systems reviewed and are negative.  EKGs/Labs/Other Studies Reviewed:    The following studies were reviewed today:  EKG:  EKG ordered today and personally reviewed.  The ekg ordered today demonstrates atrial paced rhythm right bundle branch block  Recent Labs: 08/20/2019: B Natriuretic Peptide 146.8 09/15/2019: BUN 9; Creatinine, Ser 0.79; Hemoglobin 11.9; Magnesium 2.0; Platelets 224; Potassium 4.4; Sodium 139; TSH 1.730  Recent Lipid Panel    Component Value Date/Time   CHOL 180 04/30/2015 0548   TRIG 249 (H) 04/30/2015 0548   HDL 27 (L) 04/30/2015 0548   CHOLHDL 6.7 04/30/2015 0548   VLDL 50 (H) 04/30/2015 0548   LDLCALC 103 (H) 04/30/2015 0548    Physical Exam:    VS:  BP (!) 164/70   Pulse 77   Ht 4\' 11"  (1.499 m)   Wt 171 lb 3.2 oz (77.7 kg)   SpO2 96%   BMI 34.58 kg/m     Wt Readings from Last 3 Encounters:  12/19/19 171 lb 3.2 oz (77.7 kg)  11/26/19 175 lb (79.4 kg)  11/24/19 174 lb 11.2 oz (79.2 kg)     GEN: Frail well  nourished, well developed in no acute distress HEENT: Normal NECK: No JVD; No carotid bruits  LYMPHATICS: No lymphadenopathy CARDIAC: RRR, no murmurs, rubs, gallops RESPIRATORY:  Clear to auscultation without rales, wheezing or rhonchi  ABDOMEN: Soft, non-tender, non-distended MUSCULOSKELETAL:  No edema; No deformity  SKIN: Warm and dry NEUROLOGIC:  Alert and oriented x 3 PSYCHIATRIC:  Normal affect    Signed, Shirlee More, MD  12/19/2019 1:20 PM    [facility]

## 2020-01-05 ENCOUNTER — Ambulatory Visit: Payer: Medicare Other | Admitting: Orthopaedic Surgery

## 2020-01-05 ENCOUNTER — Encounter: Payer: Self-pay | Admitting: Orthopaedic Surgery

## 2020-01-05 DIAGNOSIS — M25552 Pain in left hip: Secondary | ICD-10-CM | POA: Diagnosis not present

## 2020-01-05 DIAGNOSIS — M1612 Unilateral primary osteoarthritis, left hip: Secondary | ICD-10-CM | POA: Diagnosis not present

## 2020-01-05 NOTE — Progress Notes (Signed)
The patient comes in today to discuss hip replacement surgery.  She is an 86 year old female with known severe end-stage arthritis of her left hip.  We sent her to cardiology for clearance.  I have the notes from her cardiologist showing that she is at intermediate risk for surgery.  I discussed this with her and family in detail.  She does wish to proceed with hip replacement.  At her last visit I did show her hip model explained in detail showing her x-rays what is going on with her hip.  Today we discussed in great detail what the interoperative postoperative course involves as well as the risk and benefits of this type of surgery.  She has tried and failed all forms conservative treatment.  At this point steroid injections have not worked in her left hip and wear off quickly.  At this point she is exhausted all conservative treatment adverse and wishes to proceed with hip replacement surgery.  She is on both Plavix and Eliquis.  We will have her stop her Plavix 1 week before surgery and her Eliquis 4 days before surgery in order to have the potential for successful spinal anesthesia.  All questions and concerns were answered and addressed.  We will work on getting her scheduled for surgery.  Her left hip still hurts quite severely when I try to put her through any motion.

## 2020-02-20 ENCOUNTER — Ambulatory Visit (INDEPENDENT_AMBULATORY_CARE_PROVIDER_SITE_OTHER): Payer: Medicare Other

## 2020-02-20 DIAGNOSIS — I495 Sick sinus syndrome: Secondary | ICD-10-CM | POA: Diagnosis not present

## 2020-02-20 LAB — CUP PACEART REMOTE DEVICE CHECK
Battery Remaining Longevity: 164 mo
Battery Voltage: 3.17 V
Brady Statistic AP VP Percent: 0.07 %
Brady Statistic AP VS Percent: 89.29 %
Brady Statistic AS VP Percent: 0.02 %
Brady Statistic AS VS Percent: 10.61 %
Brady Statistic RA Percent Paced: 89.35 %
Brady Statistic RV Percent Paced: 0.09 %
Date Time Interrogation Session: 20220203225759
Implantable Lead Implant Date: 20210806
Implantable Lead Implant Date: 20210806
Implantable Lead Location: 753859
Implantable Lead Location: 753860
Implantable Lead Model: 5076
Implantable Lead Model: 5076
Implantable Pulse Generator Implant Date: 20210806
Lead Channel Impedance Value: 323 Ohm
Lead Channel Impedance Value: 437 Ohm
Lead Channel Impedance Value: 513 Ohm
Lead Channel Impedance Value: 532 Ohm
Lead Channel Pacing Threshold Amplitude: 0.75 V
Lead Channel Pacing Threshold Amplitude: 0.75 V
Lead Channel Pacing Threshold Pulse Width: 0.4 ms
Lead Channel Pacing Threshold Pulse Width: 0.4 ms
Lead Channel Sensing Intrinsic Amplitude: 12.125 mV
Lead Channel Sensing Intrinsic Amplitude: 12.125 mV
Lead Channel Sensing Intrinsic Amplitude: 3.375 mV
Lead Channel Sensing Intrinsic Amplitude: 3.375 mV
Lead Channel Setting Pacing Amplitude: 1.5 V
Lead Channel Setting Pacing Amplitude: 2 V
Lead Channel Setting Pacing Pulse Width: 0.4 ms
Lead Channel Setting Sensing Sensitivity: 1.2 mV

## 2020-02-24 NOTE — Progress Notes (Signed)
Remote pacemaker transmission.   

## 2020-03-08 ENCOUNTER — Other Ambulatory Visit: Payer: Self-pay

## 2020-03-09 ENCOUNTER — Other Ambulatory Visit: Payer: Self-pay | Admitting: Physician Assistant

## 2020-03-12 ENCOUNTER — Encounter (HOSPITAL_COMMUNITY)
Admission: RE | Admit: 2020-03-12 | Discharge: 2020-03-12 | Disposition: A | Discharge status: Home / Self Care | Payer: Medicare Other | Source: Ambulatory Visit | Attending: Orthopaedic Surgery | Admitting: Orthopaedic Surgery

## 2020-03-12 ENCOUNTER — Other Ambulatory Visit: Payer: Self-pay

## 2020-03-12 ENCOUNTER — Encounter: Payer: Self-pay | Admitting: Cardiology

## 2020-03-12 ENCOUNTER — Telehealth: Payer: Self-pay | Admitting: Cardiology

## 2020-03-12 ENCOUNTER — Encounter (HOSPITAL_COMMUNITY): Payer: Self-pay

## 2020-03-12 ENCOUNTER — Other Ambulatory Visit (HOSPITAL_COMMUNITY)
Admission: RE | Admit: 2020-03-12 | Discharge: 2020-03-12 | Disposition: A | Discharge status: Home / Self Care | Payer: Medicare Other | Source: Ambulatory Visit | Attending: Orthopaedic Surgery | Admitting: Orthopaedic Surgery

## 2020-03-12 DIAGNOSIS — Z20822 Contact with and (suspected) exposure to covid-19: Secondary | ICD-10-CM | POA: Diagnosis not present

## 2020-03-12 DIAGNOSIS — Z01812 Encounter for preprocedural laboratory examination: Secondary | ICD-10-CM | POA: Insufficient documentation

## 2020-03-12 HISTORY — DX: Diverticulitis of intestine, part unspecified, without perforation or abscess without bleeding: K57.92

## 2020-03-12 HISTORY — DX: Personal history of urinary calculi: Z87.442

## 2020-03-12 HISTORY — DX: Presence of cardiac pacemaker: Z95.0

## 2020-03-12 HISTORY — DX: Pneumonia, unspecified organism: J18.9

## 2020-03-12 LAB — TYPE AND SCREEN
ABO/RH(D): B POS
Antibody Screen: NEGATIVE

## 2020-03-12 LAB — SURGICAL PCR SCREEN
MRSA, PCR: NEGATIVE
Staphylococcus aureus: NEGATIVE

## 2020-03-12 LAB — CBC
HCT: 38.1 % (ref 36.0–46.0)
Hemoglobin: 12.3 g/dL (ref 12.0–15.0)
MCH: 29.4 pg (ref 26.0–34.0)
MCHC: 32.3 g/dL (ref 30.0–36.0)
MCV: 91.1 fL (ref 80.0–100.0)
Platelets: 209 10*3/uL (ref 150–400)
RBC: 4.18 MIL/uL (ref 3.87–5.11)
RDW: 13.2 % (ref 11.5–15.5)
WBC: 5.7 10*3/uL (ref 4.0–10.5)
nRBC: 0 % (ref 0.0–0.2)

## 2020-03-12 LAB — BASIC METABOLIC PANEL
Anion gap: 9 (ref 5–15)
BUN: 12 mg/dL (ref 8–23)
CO2: 26 mmol/L (ref 22–32)
Calcium: 9.6 mg/dL (ref 8.9–10.3)
Chloride: 102 mmol/L (ref 98–111)
Creatinine, Ser: 0.83 mg/dL (ref 0.44–1.00)
GFR, Estimated: >60 mL/min (ref >60)
Glucose, Bld: 101 mg/dL — ABNORMAL HIGH (ref 70–99)
Potassium: 4 mmol/L (ref 3.5–5.1)
Sodium: 137 mmol/L (ref 135–145)

## 2020-03-12 LAB — SARS CORONAVIRUS 2 (TAT 6-24 HRS): SARS Coronavirus 2: NEGATIVE

## 2020-03-12 NOTE — Pre-Procedure Instructions (Signed)
EADEN WOHLGEMUTH  03/12/2020    Your procedure is scheduled on Tuesday, March 16, 2020 at 12 Noon.   Report to [facility] Entrance "A" Admitting Office at 10:00 AM.   Call this number if you have problems the morning of surgery: [phone]   Questions prior to day of surgery, please call [phone] between 8 & 4 PM.   Remember:  Do not eat food after midnight Monday, 03/15/20.  You may drink clear liquids until 9:00 AM.  Clear liquids allowed are: Water, Juice (non-citric and without pulp - diabetics please choose diet or no sugar options), Carbonated beverages - (diabetics please choose diet or no sugar options), Clear Tea, Black Coffee only (no creamer, milk or cream including half and half) and Gatorade (diabetics please choose diet or no sugar options)   Drink the Pre-Surgery Ensure just prior to 9:00 AM. This will be the last liquid you will have prior to surgery.    Take these medicines the morning of surgery with A SIP OF WATER: Amiodarone (Pacerone), Amlodipine (Norvasc), Levothyroxine (Synthroid), Acetaminophen (Tylenol) - if needed, Famotidine (Pepcid) - if needed, Nitroglycerin - if needed.  Stop Plavix (7 days prior) and Eliquis (4 days prior) as instructed. Stop Vitamins as of today. Do not use Aspirin containing products, NSAIDS (Ibuprofen, Aleve, etc), Fish Oil or Herbal medications prior to surgery.    Do not wear jewelry, make-up or nail polish.  Do not wear lotions, powders, perfumes or deodorant.  Do not shave 48 hours prior to surgery.    Do not bring valuables to the hospital.  [facility] is not responsible for any belongings or valuables.  Contacts, dentures or bridgework may not be worn into surgery.  Leave your suitcase in the car.  After surgery it may be brought to your room.  For patients admitted to the hospital, discharge time will be determined by your treatment team.  Patients discharged the day of surgery will not be allowed to drive home.    [facility] - Preparing for Surgery  Before surgery, you can play an important role.  Because skin is not sterile, your skin needs to be as free of germs as possible.  You can reduce the number of germs on you skin by washing with CHG (chlorahexidine gluconate) soap before surgery.  CHG is an antiseptic cleaner which kills germs and bonds with the skin to continue killing germs even after washing.  Oral Hygiene is also important in reducing the risk of infection.  Remember to brush your teeth with your regular toothpaste the morning of surgery.  Please DO NOT use if you have an allergy to CHG or antibacterial soaps.  If your skin becomes reddened/irritated stop using the CHG and inform your nurse when you arrive at Short Stay.  Do not shave (including legs and underarms) for at least 48 hours prior to the first CHG shower.  You may shave your face.  Please follow these instructions carefully:   1.  Shower with CHG Soap the night before surgery and the morning of Surgery.  2.  If you choose to wash your hair, wash your hair first as usual with your normal shampoo.  3.  After you shampoo, rinse your hair and body thoroughly to remove the shampoo. 4.  Use CHG as you would any other liquid soap.  You can apply chg directly to the skin and wash gently with a      scrungie or washcloth.  5.  Apply the CHG Soap to your body ONLY FROM THE NECK DOWN.   Do not use on open wounds or open sores. Avoid contact with your eyes, ears, mouth and genitals (private parts).  Wash genitals (private parts) with your normal soap - do this prior to using CHG soap  6.  Wash thoroughly, paying special attention to the area where your surgery will be performed.  7.  Thoroughly rinse your body with warm water from the neck down.  8.  DO NOT shower/wash with your normal soap after using and rinsing off the CHG Soap.  9.  Pat yourself dry with a clean towel.            10.  Wear clean pajamas.            11.   Place clean sheets on your bed the night of your first shower and do not sleep with pets.  Day of Surgery  Shower as above.  Do not apply any lotions/deodorants the morning of surgery.   Please wear clean clothes to the hospital. Remember to brush your teeth with toothpaste.  Please read over the fact sheets that you were given.

## 2020-03-12 NOTE — Telephone Encounter (Signed)
Called patient. She reports two weeks ago her blood pressure was 265/118. It hasn't been that high since then. Today it is 140/60. No dizziness, headache, or blurred vision or chest pain. She believes the day it got high was from some preop anxiety she is experiencing.She feels fine now. She will continue to monitor now and let us know if it gets higher. She just wanted to let our office know. Also she wants her pharmacy changed to [facility] in [place], will do this.  Will send message to dod for them to review. Again, patient doesn't want anything changed as the high readings have not came back since two weeks ago, she just wanted Dr. Bettina Gavia to be aware. Will also send to him so he can review when he returns.

## 2020-03-12 NOTE — Progress Notes (Signed)
[place] DEVICE PROGRAMMING  Patient Information: Name:  NARISSA CREVELING  DOB:  1930-03-23  MRN:  [id number]    Planned Procedure: Left total hip arthroplasty  Surgeon: Dr. Kathrynn Speed  Date of Procedure: 03/16/20  Cautery will be used.  Position during surgery: supine   Please send documentation back to:  [facility] (Fax # [phone])   Device Information:  Clinic EP Physician:  Dr. Lars Mage   Device Type:  Pacemaker Manufacturer and Phone #:  Medtronic: [id number] Pacemaker Dependent?:  No. Date of Last Device Check:  02/19/20 Normal Device Function?:  Yes.    Electrophysiologist's Recommendations:   Have magnet available.  Provide continuous ECG monitoring when magnet is used or reprogramming is to be performed.   Procedure should not interfere with device function.  No device programming or magnet placement needed.  Per Device Clinic Standing Orders, Simone Curia, RN  1:39 PM 03/12/2020

## 2020-03-12 NOTE — Progress Notes (Signed)
PCP - Dr. Arelia Sneddon Cardiologist -  Dr. Bettina Gavia  PPM/ICD - Pacemaker Device Orders - sent to [facility] Rep Notified - emailed Gae Dry  Chest x-ray - 08/23/19 EKG - 12/19/19 ECHO - 08/21/19 Cardiac Cath - 01/06/16  Blood Thinner Instructions: Last dose of Eliquis - 03/11/20, Last dose of Plavix - 03/09/20  Instructed by Dr. Ninfa Linden to stop Eliquis 4 days prior and Plavix 7 days prior to surgery  ERAS Protcol - yes with Pre-Surgery Ensure    COVID TEST- scheduled for today.   Anesthesia review: yes. Pt reported that a week ago (Feb. 17th) she had an episode where she felt her heart rate get faster, felt flushed, said her ears felt like they were on fire and she had ringing like sensation in her ears. She said she took a full strength Aspirin and 1 NTG tab. She said she checked her BP with a wrist monitor and it was 265/118 and her pulse was 91. She called her nephew who is an EMT and he came and monitored her for a couple of hours. She said that the noise in her ears and the hot feeling went away within 30 minutes of her taking the Aspirin and NTG. She states the last BP that her nephew took before he left was 154/60. She stated that she did not call Dr. Joya Gaskins office. She also mentioned this is the 2nd episode that she has had of this since getting her Pacemaker in August. I spoke with Bryson Ha, [place] and she said that pt needed to call Dr. Joya Gaskins office today and speak his nurse and tell her about this episode. I instructed pt and her son to call Dr. Joya Gaskins office today. They both voiced understanding.   Patient denies shortness of breath, fever, cough and chest pain at PAT appointment   All instructions explained to the patient, with a verbal understanding of the material. Patient agrees to go over the instructions while at home for a better understanding. Patient also instructed to self quarantine after being tested for COVID-19. The opportunity to ask questions was provided.

## 2020-03-12 NOTE — Telephone Encounter (Signed)
Pt c/o BP issue: STAT if pt c/o blurred vision, one-sided weakness or slurred speech  1. What are your last 5 BP readings? 265/118 HR 90 was high for 2 hr, came down gradually, 156/60 came down to, today 140/60  2. Are you having any other symptoms (ex. Dizziness, headache, blurred vision, passed out)? no  3. What is your BP issue? Patient states she had her appointment preop with surgeon 03/16/2020   Patient states she had a high BP episode about 2 weeks ago. She states her BP went up to 265/118 HR 90, and gradually came down to normal over 2 hours. She states she has not had it happen since, but during her preop appointment with her surgeon's office they asked her to inform Dr. Bettina Gavia. She states she thinks the episode was an anxiety attack.

## 2020-03-15 ENCOUNTER — Encounter (HOSPITAL_COMMUNITY): Payer: Self-pay

## 2020-03-15 NOTE — Anesthesia Preprocedure Evaluation (Addendum)
Anesthesia Evaluation  Patient identified by MRN, date of birth, ID band Patient awake    Reviewed: Allergy & Precautions, NPO status , Patient's Chart, lab work & pertinent test results  History of Anesthesia Complications Negative for: history of anesthetic complications  Airway Mallampati: II  TM Distance: >3 FB Neck ROM: Full    Dental  (+) Missing,    Pulmonary former smoker,    Pulmonary exam normal        Cardiovascular hypertension, Pt. on medications + CAD, + Past MI, + CABG (2003) and +CHF  Normal cardiovascular exam+ pacemaker   Echo 08/21/19: EF 55-60%, hypokinesis of basal inferior and inferolateral wall , mild LVH, s/p MV repair with mild MS and mild MR, mild LAE, mild RV dysfunction    Neuro/Psych Anxiety negative neurological ROS     GI/Hepatic Neg liver ROS, GERD  Medicated and Controlled,  Endo/Other  Hypothyroidism   Renal/GU negative Renal ROS  negative genitourinary   Musculoskeletal  (+) Arthritis ,   Abdominal   Peds  Hematology negative hematology ROS (+)   Anesthesia Other Findings Day of surgery medications reviewed with patient.  Reproductive/Obstetrics negative OB ROS                           Anesthesia Physical Anesthesia Plan  ASA: III  Anesthesia Plan: Spinal   Post-op Pain Management:    Induction:   PONV Risk Score and Plan: 3 and Treatment may vary due to age or medical condition, Ondansetron, Propofol infusion and Dexamethasone  Airway Management Planned: Natural Airway and Simple Face Mask  Additional Equipment: None  Intra-op Plan:   Post-operative Plan:   Informed Consent: I have reviewed the patients History and Physical, chart, labs and discussed the procedure including the risks, benefits and alternatives for the proposed anesthesia with the patient or authorized representative who has indicated his/her understanding and acceptance.      Dental advisory given  Plan Discussed with: CRNA  Anesthesia Plan Comments: (PAT note written 03/15/2020 by Myra Gianotti, PA-C. Has Medtronic Azure XT DR MRI SureScan PPM.)      Anesthesia Quick Evaluation

## 2020-03-15 NOTE — Telephone Encounter (Signed)
Normal device function. No alerts logged.  Unable to snip check into Epic.

## 2020-03-15 NOTE — Telephone Encounter (Signed)
I help the patient sent a manual transmission for the nurse to review. Transmission received. I told the patient the nurse will review it and give her a call back.

## 2020-03-15 NOTE — Progress Notes (Signed)
Anesthesia Chart Review:  Case: [id number] Date/Time: 03/16/20 1145   Procedure: LEFT TOTAL HIP ARTHROPLASTY ANTERIOR APPROACH (Left Hip)   Anesthesia type: Spinal   Pre-op diagnosis: osteoarthritis left hip   Location: [place] OR ROOM 05 / [place] [facility]   Surgeons: Mcarthur Rossetti, MD      DISCUSSION: Patient is an 87 year old female scheduled for the above procedure.  History includes former smoker (quit 06/06/67), CAD/Mitral regurgitation (MI 1980; CABG: LIMA-LAD, SVG-OM, SVG-RPL with MV repair using quadrangular resection, posterior leaflet with artifical cords x3, 26 mm Seguin ring annuloplasty 08/28/01; inferior STEMI, s/p aspiration thrombectomy SVG-RCA and Resolute stent mid RCA 07/12/13; patient LIMA-LAD, occluded SVG-OM, high grade obstruction SVG-RCA, s/p Promus Premier stents to mid and proximal SVG-RCA 04/30/15), PAF (post CABG/MV repair 2003), chronic diastolic CHF, sinus node dysfunction (with 4 second pause, s/p Medtronic Azure XT DR MRI SureScan PPM 08/22/19), HTN, HLD, hypothyroidism (partial thyroidectomy 1968; 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 BMI is consistent with obesity.    Last visit with cardiology was on 12/19/19 with Dr. Bettina Gavia for follow-up and preoperative evaluation. He wrote: "Her procedure is intermediate risk elective.  Her major cardiovascular issues include the pacemaker and combined antiplatelet anticoagulant therapy.  Her congestive heart failure is compensated coronary artery disease is stable hypertension and dyslipidemia are treated at target and she remains in sinus rhythm on amiodarone.  My perspective she is optimized for the planned procedure.  She will need to withdraw clopidogrel 5 to 7 days preoperatively and last the  surgery and resume postoperatively when he thinks it stable.  She will need to be off her anticoagulant 3 full days prior to surgery and the like to see that resume first postoperatively 24 to 48 hours afterwards.  He should go to a monitored bed to monitor for recurrent atrial fibrillation and continue her usual cardiac meds including amiodarone.  Intraoperatively if there is a concern for pacemaker EMI smart magnet is appropriate.  Knowing the location of the surgical field and the fact that she is not pacemaker dependent I do not think will have a problem.  I do not think she requires any further preoperative cardiac testing with the results of her echocardiogram this year quite reassuring".  Patient reported that on 03/04/20 she had an episode where she felt like her HR was elevated and felt flushed with full/burning ears. Her reported home BP (wrist monitor) was 265/118, so she took an ASA and Nitro (no chest pain by report) and contacted her nephew who is an EMT. HR by BP monitor was 91. Reportedly, he evaluated and monitored her and symptoms resolved and BP improved within 30 minutes. She thinks she may have had a panic attack, but said this was the second episode since her PPM was inserted in 08/2019. Since then, she has felt back to baseline and denied chest pain and SOB. BP 148/63, HR 83. Given recurrent episode and upcoming surgery, advised she at least contact cardiology to discuss. Cardiology may want to interrogate/download PPM. Patient reached out to [facility] staff on 03/12/20. I also have communicated with Dr. Bettina Gavia. He is asking staff to download PPM on 03/15/20 to check for any tachyarrhythmias--this was done and showed "Normal device function. No alerts logged." Nothing seen that would warrant her symptoms per Roma Kayser, RN message  to Dr. Bettina Gavia and myself.   Last Eliqus 03/11/20, last Plavix 03/09/20.   03/12/20 presurgical COVID-19 test negative. Anesthesia team to  reassess on the day of surgery.    VS: BP (!) 148/63   Pulse 84   Temp (!) 36.4 C (Oral)   Resp 18   Ht '4\' 11"'$  (1.499 m)   Wt 79.8 kg   SpO2 99%   BMI 35.55 kg/m    PROVIDERS: Leonard Downing, MD is PCP  Shirlee More, MD is cardiologist Lars Mage, MD is EP cardiologist   LABS: Labs reviewed: Acceptable for surgery. (all labs ordered are listed, but only abnormal results are displayed)  Labs Reviewed  BASIC METABOLIC PANEL - Abnormal; Notable for the following components:      Result Value   Glucose, Bld 101 (*)    All other components within normal limits  SURGICAL PCR SCREEN  CBC  TYPE AND SCREEN    IMAGES: CXR 08/23/19: FINDINGS: - Median sternotomy and CABG. The LEFT-sided transvenous pacemaker has been placed, leads overlying the RIGHT atrium and RIGHT ventricle. There is no pneumothorax. - The heart is enlarged and stable in configuration. No edema. No consolidations or pleural effusions. IMPRESSION: 1. Interval placement of transvenous pacemaker. No pneumothorax. 2. Stable cardiomegaly.   EKG: EKG 12/19/19: Atrial paced rhythm with prolonged AV conduction. Right BBB.   CV: Echo 08/21/19: IMPRESSIONS  1. Hypokinesis of the basal inferior and inferolateral wall with overall  preserved LV function; mild LVH; s/p MV repair with mild MS (mean gradient  5 mmHg) and mild MR; mild LAE; mild RV dysfunction.  2. Left ventricular ejection fraction, by estimation, is 55 to 60%. The  left ventricle has normal function. The left ventricle demonstrates  regional wall motion abnormalities (see scoring diagram/findings for  description). There is mild left ventricular  hypertrophy. Left ventricular diastolic parameters are indeterminate.  3. Right ventricular systolic function is mildly reduced. The right  ventricular size is normal. There is normal pulmonary artery systolic  pressure.  4. Left atrial size was mildly dilated.  5. The mitral  valve has been repaired/replaced. Mild mitral valve  regurgitation. Mild mitral stenosis.  6. The aortic valve is tricuspid. Aortic valve regurgitation is not  visualized. Mild to moderate aortic valve sclerosis/calcification is  present, without any evidence of aortic stenosis.  7. The inferior vena cava is normal in size with greater than 50%  respiratory variability, suggesting right atrial pressure of 3 mmHg.  (Comparison: 07/17/13: LVEF 55-60%, mild inferior hypokinesis, moderate dilated LA, prior MV repair with trivial-mild MR, mild PR)   Cardiac cath [id number]:  LV end diastolic pressure is mildly elevated.  The left ventricular ejection fraction is 50-55% by visual estimate.  There is no aortic valve stenosis.  Mid RCA lesion, 100 %stenosed.  Dist RCA lesion, 100 %stenosed.  Prox Cx lesion, 100 %stenosed.  Mid LAD to Dist LAD lesion, 90 %stenosed.  Stents in SVG to RCA are widely patent.  SVG to OM is occluded.  LIMA to LAD is patent.  No change from end result of cath in April 2017.  Widely patent stents in the SVG to RCA.  Lateral wall defect on nuclear is likely due to occluded circumflex and occluded SVG to OM.  She has faint collaterals to her lateral wall.  Continue medical therapy.       Past Medical History:  Diagnosis Date  . Anxiety attack 01/03/2016  . Aphasia 01/03/2016  . Arthritis   .  CAD (coronary artery disease) of artery bypass graft, s/p PCI stent to VG->RCA 04/30/15 07/14/2013   Catheterization August/2003 showing severe three-vessel disease and moderate mitral regurgitation 08/28/2001 CABG with internal mammary graft to LAD, vein graft to circumflex, and vein graft to posterolateral branch and mitral valve repair by Dr. 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 Patent internal ma  . CAD (coronary artery disease), native coronary artery 07/14/2013    Catheterization August/2003 showing severe three-vessel disease and moderate mitral regurgitation 08/28/2001 CABG with internal mammary graft to LAD, vein graft to circumflex, and vein graft to posterolateral branch and mitral valve repair by Dr. 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 Patent internal mammary graft with competitive flow in the distal vessel, occluded right coronary artery, occluded circumflex, patent saphenous vein graft to circumflex with mild/moderate disease proximally, minimal LV damage   . Chest pain 08/20/2019  . Chronic diastolic CHF (congestive heart failure) (HCC)    a. EF 60% by LV gram 01/2009.  . Coronary artery disease    a. s/p MI in 1980;  b. 2003 CABG x 3 (LIMA->LAD, VG->OM, VG->RPL);  c. 01/2009 Cath: 3/3 patent grafts, native 3VD, EF 60%.  . Diverticulitis   . Dyspnea 01/03/2016  . Elevated troponin   . GERD (gastroesophageal reflux disease)   . H/O mitral valve repair    a. 2003  . History of kidney stones   . Hyperlipidemia 07/14/2013   Intolerance to multiple statins including Crestor, pravastatin, and Lipitor   . Hypertension   . Hypertension, uncontrolled 07/19/2014  . Hypertensive heart disease 07/14/2013  . Hypothyroidism   . NSTEMI (non-ST elevated myocardial infarction) ([place])   . Other hyperlipidemia   . PAF (paroxysmal atrial fibrillation) ([place])   . Pneumonia   . Presence of permanent cardiac pacemaker 08/22/2019  . S/P partial hysterectomy 1977  . Sinus node dysfunction [facility])     Past Surgical History:  Procedure Laterality Date  . ABDOMINAL HYSTERECTOMY  1977  . APPENDECTOMY  1950  . BLADDER SUSPENSION     tac  . CARDIAC CATHETERIZATION  03,05,11  . CARDIAC CATHETERIZATION N/A 04/30/2015   Procedure: Left Heart Cath and Cors/Grafts Angiography;  Surgeon: Belva Crome, MD;  Location: [place] CV LAB;  Service: Cardiovascular;  Laterality: N/A;  .  CARDIAC CATHETERIZATION N/A 04/30/2015   Procedure: Coronary Stent Intervention;  Surgeon: Belva Crome, MD;  Location: [place] CV LAB;  Service: Cardiovascular;  Laterality: N/A;  Proximal and Distal SVG to the PLA  . CARDIAC CATHETERIZATION N/A 01/06/2016   Procedure: Left Heart Cath and Cors/Grafts Angiography;  Surgeon: Jettie Booze, MD;  Location: [place] CV LAB;  Service: Cardiovascular;  Laterality: N/A;  . CARPAL TUNNEL RELEASE     right and left  . CHOLECYSTECTOMY    . CORONARY ARTERY BYPASS GRAFT  2003  . KNEE ARTHROSCOPY     left  . LEFT HEART CATHETERIZATION WITH CORONARY ANGIOGRAM N/A 07/12/2013   Procedure: LEFT HEART CATHETERIZATION WITH CORONARY ANGIOGRAM;  Surgeon: Jettie Booze, MD;  Location: [facility] CATH LAB;  Service: Cardiovascular;  Laterality: N/A;  . MITRAL VALVE REPAIR  2003   with cabg  . PACEMAKER IMPLANT N/A 08/22/2019   Procedure: PACEMAKER IMPLANT;  Surgeon: Constance Haw, MD;  Location: [place] CV LAB;  Service:  Cardiovascular;  Laterality: N/A;  . THYROIDECTOMY  1968  . THYROIDECTOMY, PARTIAL     left    MEDICATIONS: . apixaban (ELIQUIS) 5 MG TABS tablet  . acetaminophen (TYLENOL) 500 MG tablet  . amiodarone (PACERONE) 200 MG tablet  . amLODipine (NORVASC) 2.5 MG tablet  . cholecalciferol (VITAMIN D3) 25 MCG (1000 UNIT) tablet  . clopidogrel (PLAVIX) 75 MG tablet  . ezetimibe (ZETIA) 10 MG tablet  . famotidine (PEPCID) 10 MG tablet  . gabapentin (NEURONTIN) 100 MG capsule  . levothyroxine (SYNTHROID, LEVOTHROID) 50 MCG tablet  . nitroGLYCERIN (NITROSTAT) 0.4 MG SL tablet   No current facility-administered medications for this encounter.    Myra Gianotti, PA-C Surgical Short Stay/Anesthesiology [facility] Phone [phone] [facility] Phone [phone] 03/15/2020 1:35 PM

## 2020-03-16 ENCOUNTER — Encounter (HOSPITAL_COMMUNITY)
Admission: AD | Disposition: A | Discharge status: Home / Self Care | Payer: Self-pay | Source: Home / Self Care | Attending: Orthopaedic Surgery

## 2020-03-16 ENCOUNTER — Ambulatory Visit (HOSPITAL_COMMUNITY): Payer: Medicare Other | Admitting: Vascular Surgery

## 2020-03-16 ENCOUNTER — Other Ambulatory Visit: Payer: Self-pay

## 2020-03-16 ENCOUNTER — Observation Stay (HOSPITAL_COMMUNITY): Payer: Medicare Other

## 2020-03-16 ENCOUNTER — Encounter (HOSPITAL_COMMUNITY): Payer: Self-pay | Admitting: Orthopaedic Surgery

## 2020-03-16 ENCOUNTER — Ambulatory Visit (HOSPITAL_COMMUNITY): Payer: Medicare Other

## 2020-03-16 ENCOUNTER — Inpatient Hospital Stay (HOSPITAL_COMMUNITY)
Admission: AD | Admit: 2020-03-16 | Discharge: 2020-03-20 | DRG: 470 | Disposition: A | Discharge status: Home / Self Care | Payer: Medicare Other | Attending: Orthopaedic Surgery | Admitting: Orthopaedic Surgery

## 2020-03-16 DIAGNOSIS — I340 Nonrheumatic mitral (valve) insufficiency: Secondary | ICD-10-CM | POA: Diagnosis present

## 2020-03-16 DIAGNOSIS — Z888 Allergy status to other drugs, medicaments and biological substances status: Secondary | ICD-10-CM

## 2020-03-16 DIAGNOSIS — E890 Postprocedural hypothyroidism: Secondary | ICD-10-CM | POA: Diagnosis present

## 2020-03-16 DIAGNOSIS — E7849 Other hyperlipidemia: Secondary | ICD-10-CM | POA: Diagnosis present

## 2020-03-16 DIAGNOSIS — M1612 Unilateral primary osteoarthritis, left hip: Principal | ICD-10-CM

## 2020-03-16 DIAGNOSIS — K219 Gastro-esophageal reflux disease without esophagitis: Secondary | ICD-10-CM | POA: Diagnosis present

## 2020-03-16 DIAGNOSIS — F411 Generalized anxiety disorder: Secondary | ICD-10-CM | POA: Diagnosis present

## 2020-03-16 DIAGNOSIS — Z87442 Personal history of urinary calculi: Secondary | ICD-10-CM

## 2020-03-16 DIAGNOSIS — Z90711 Acquired absence of uterus with remaining cervical stump: Secondary | ICD-10-CM

## 2020-03-16 DIAGNOSIS — E039 Hypothyroidism, unspecified: Secondary | ICD-10-CM | POA: Diagnosis present

## 2020-03-16 DIAGNOSIS — Z96642 Presence of left artificial hip joint: Secondary | ICD-10-CM

## 2020-03-16 DIAGNOSIS — Z8249 Family history of ischemic heart disease and other diseases of the circulatory system: Secondary | ICD-10-CM

## 2020-03-16 DIAGNOSIS — I5032 Chronic diastolic (congestive) heart failure: Secondary | ICD-10-CM | POA: Diagnosis present

## 2020-03-16 DIAGNOSIS — I480 Paroxysmal atrial fibrillation: Secondary | ICD-10-CM | POA: Diagnosis present

## 2020-03-16 DIAGNOSIS — I110 Hypertensive heart disease with heart failure: Secondary | ICD-10-CM | POA: Diagnosis present

## 2020-03-16 DIAGNOSIS — Z419 Encounter for procedure for purposes other than remedying health state, unspecified: Secondary | ICD-10-CM

## 2020-03-16 DIAGNOSIS — Z950 Presence of cardiac pacemaker: Secondary | ICD-10-CM

## 2020-03-16 DIAGNOSIS — R4701 Aphasia: Secondary | ICD-10-CM | POA: Diagnosis present

## 2020-03-16 DIAGNOSIS — Z20822 Contact with and (suspected) exposure to covid-19: Secondary | ICD-10-CM | POA: Diagnosis present

## 2020-03-16 DIAGNOSIS — Z87891 Personal history of nicotine dependence: Secondary | ICD-10-CM

## 2020-03-16 DIAGNOSIS — Z9049 Acquired absence of other specified parts of digestive tract: Secondary | ICD-10-CM

## 2020-03-16 DIAGNOSIS — Z9861 Coronary angioplasty status: Secondary | ICD-10-CM

## 2020-03-16 DIAGNOSIS — I252 Old myocardial infarction: Secondary | ICD-10-CM

## 2020-03-16 DIAGNOSIS — I2510 Atherosclerotic heart disease of native coronary artery without angina pectoris: Secondary | ICD-10-CM | POA: Diagnosis present

## 2020-03-16 DIAGNOSIS — I25810 Atherosclerosis of coronary artery bypass graft(s) without angina pectoris: Secondary | ICD-10-CM | POA: Diagnosis present

## 2020-03-16 HISTORY — PX: OTHER SURGICAL HISTORY: SHX169

## 2020-03-16 HISTORY — PX: TOTAL HIP ARTHROPLASTY: SHX124

## 2020-03-16 LAB — ABO/RH: ABO/RH(D): B POS

## 2020-03-16 SURGERY — ARTHROPLASTY, HIP, TOTAL, ANTERIOR APPROACH
Anesthesia: Monitor Anesthesia Care | Site: Hip | Laterality: Left

## 2020-03-16 MED ORDER — PHENOL 1.4 % MT LIQD: 1.0000 | OROMUCOSAL | Status: DC | PRN

## 2020-03-16 MED ORDER — FENTANYL CITRATE (PF) 250 MCG/5ML IJ SOLN
INTRAMUSCULAR | Status: AC
  Filled 2020-03-16: qty 5

## 2020-03-16 MED ORDER — PROPOFOL 10 MG/ML IV BOLUS
INTRAVENOUS | Status: DC | PRN
  Administered 2020-03-16: 10 mg via INTRAVENOUS

## 2020-03-16 MED ORDER — AMIODARONE HCL 200 MG PO TABS: 200.0000 mg | ORAL_TABLET | Freq: Every day | ORAL | Status: DC

## 2020-03-16 MED ORDER — SODIUM CHLORIDE 0.9 % IV SOLN: INTRAVENOUS | Status: DC

## 2020-03-16 MED ORDER — METHOCARBAMOL 500 MG PO TABS
500.0000 mg | ORAL_TABLET | Freq: Four times a day (QID) | ORAL | Status: DC | PRN
  Administered 2020-03-20: 500 mg via ORAL
  Filled 2020-03-16: qty 1

## 2020-03-16 MED ORDER — POVIDONE-IODINE 10 % EX SWAB
2.0000 "application " | Freq: Once | CUTANEOUS | Status: AC
  Administered 2020-03-16: 2 via TOPICAL

## 2020-03-16 MED ORDER — TRANEXAMIC ACID-NACL 1000-0.7 MG/100ML-% IV SOLN
1000.0000 mg | INTRAVENOUS | Status: AC
  Administered 2020-03-16: 1000 mg via INTRAVENOUS
  Filled 2020-03-16: qty 100

## 2020-03-16 MED ORDER — PHENYLEPHRINE HCL-NACL 10-0.9 MG/250ML-% IV SOLN
INTRAVENOUS | Status: DC | PRN
  Administered 2020-03-16: 30 ug/min via INTRAVENOUS

## 2020-03-16 MED ORDER — ONDANSETRON HCL 4 MG/2ML IJ SOLN
INTRAMUSCULAR | Status: AC
  Filled 2020-03-16: qty 2

## 2020-03-16 MED ORDER — PROMETHAZINE HCL 25 MG/ML IJ SOLN: 6.2500 mg | INTRAMUSCULAR | Status: DC | PRN

## 2020-03-16 MED ORDER — LEVOTHYROXINE SODIUM 50 MCG PO TABS
50.0000 ug | ORAL_TABLET | Freq: Every day | ORAL | Status: DC
  Administered 2020-03-17 – 2020-03-20 (×4): 50 ug via ORAL
  Filled 2020-03-16 (×4): qty 1

## 2020-03-16 MED ORDER — OXYCODONE HCL 5 MG PO TABS
5.0000 mg | ORAL_TABLET | Freq: Once | ORAL | Status: DC | PRN
Start: 2020-03-16 — End: 2020-03-16

## 2020-03-16 MED ORDER — ORAL CARE MOUTH RINSE: 15.0000 mL | Freq: Once | OROMUCOSAL | Status: AC

## 2020-03-16 MED ORDER — HYDROCODONE-ACETAMINOPHEN 5-325 MG PO TABS
1.0000 | ORAL_TABLET | ORAL | Status: DC | PRN
Start: 2020-03-16 — End: 2020-03-17
  Filled 2020-03-16: qty 1

## 2020-03-16 MED ORDER — METOCLOPRAMIDE HCL 5 MG PO TABS: 5.0000 mg | ORAL_TABLET | Freq: Three times a day (TID) | ORAL | Status: DC | PRN

## 2020-03-16 MED ORDER — MENTHOL 3 MG MT LOZG: 1.0000 | LOZENGE | OROMUCOSAL | Status: DC | PRN

## 2020-03-16 MED ORDER — BUPIVACAINE IN DEXTROSE 0.75-8.25 % IT SOLN
INTRATHECAL | Status: DC | PRN
  Administered 2020-03-16: 1.7 mL via INTRATHECAL

## 2020-03-16 MED ORDER — MORPHINE SULFATE (PF) 2 MG/ML IV SOLN
0.5000 mg | INTRAVENOUS | Status: DC | PRN
  Administered 2020-03-16 (×2): 1 mg via INTRAVENOUS
  Filled 2020-03-16 (×3): qty 1

## 2020-03-16 MED ORDER — LACTATED RINGERS IV SOLN: INTRAVENOUS | Status: DC

## 2020-03-16 MED ORDER — FENTANYL CITRATE (PF) 100 MCG/2ML IJ SOLN: 25.0000 ug | INTRAMUSCULAR | Status: DC | PRN

## 2020-03-16 MED ORDER — OXYCODONE HCL 5 MG/5ML PO SOLN: 5.0000 mg | Freq: Once | ORAL | Status: DC | PRN

## 2020-03-16 MED ORDER — ACETAMINOPHEN 500 MG PO TABS
1000.0000 mg | ORAL_TABLET | Freq: Once | ORAL | Status: AC
  Administered 2020-03-16: 1000 mg via ORAL
  Filled 2020-03-16: qty 2

## 2020-03-16 MED ORDER — NITROGLYCERIN 0.4 MG SL SUBL: 0.4000 mg | SUBLINGUAL_TABLET | SUBLINGUAL | Status: DC | PRN

## 2020-03-16 MED ORDER — DOCUSATE SODIUM 100 MG PO CAPS
100.0000 mg | ORAL_CAPSULE | Freq: Two times a day (BID) | ORAL | Status: DC
  Administered 2020-03-16 – 2020-03-20 (×8): 100 mg via ORAL
  Filled 2020-03-16 (×9): qty 1

## 2020-03-16 MED ORDER — ONDANSETRON HCL 4 MG/2ML IJ SOLN
INTRAMUSCULAR | Status: DC | PRN
  Administered 2020-03-16: 4 mg via INTRAVENOUS

## 2020-03-16 MED ORDER — HYDROCODONE-ACETAMINOPHEN 7.5-325 MG PO TABS
1.0000 | ORAL_TABLET | ORAL | Status: DC | PRN
  Filled 2020-03-16: qty 1

## 2020-03-16 MED ORDER — ONDANSETRON HCL 4 MG PO TABS
4.0000 mg | ORAL_TABLET | Freq: Four times a day (QID) | ORAL | Status: DC | PRN
  Filled 2020-03-16: qty 1

## 2020-03-16 MED ORDER — CHLORHEXIDINE GLUCONATE 0.12 % MT SOLN
15.0000 mL | Freq: Once | OROMUCOSAL | Status: AC
  Administered 2020-03-16: 15 mL via OROMUCOSAL
  Filled 2020-03-16: qty 15

## 2020-03-16 MED ORDER — AMIODARONE HCL 200 MG PO TABS
200.0000 mg | ORAL_TABLET | Freq: Every day | ORAL | Status: DC
  Administered 2020-03-17 – 2020-03-20 (×4): 200 mg via ORAL
  Filled 2020-03-16 (×4): qty 1

## 2020-03-16 MED ORDER — AMLODIPINE BESYLATE 2.5 MG PO TABS
2.5000 mg | ORAL_TABLET | Freq: Every day | ORAL | Status: DC
  Administered 2020-03-17 – 2020-03-20 (×4): 2.5 mg via ORAL
  Filled 2020-03-16 (×4): qty 1

## 2020-03-16 MED ORDER — EZETIMIBE 10 MG PO TABS
10.0000 mg | ORAL_TABLET | Freq: Every day | ORAL | Status: DC
  Administered 2020-03-17 – 2020-03-20 (×4): 10 mg via ORAL
  Filled 2020-03-16 (×4): qty 1

## 2020-03-16 MED ORDER — CEFAZOLIN SODIUM-DEXTROSE 2-4 GM/100ML-% IV SOLN
2.0000 g | INTRAVENOUS | Status: AC
  Administered 2020-03-16: 2 g via INTRAVENOUS
  Filled 2020-03-16: qty 100

## 2020-03-16 MED ORDER — CEFAZOLIN SODIUM-DEXTROSE 1-4 GM/50ML-% IV SOLN
1.0000 g | Freq: Four times a day (QID) | INTRAVENOUS | Status: AC
  Administered 2020-03-16 – 2020-03-17 (×2): 1 g via INTRAVENOUS
  Filled 2020-03-16 (×2): qty 50

## 2020-03-16 MED ORDER — SODIUM CHLORIDE 0.9 % IR SOLN
Status: DC | PRN
  Administered 2020-03-16: 3000 mL

## 2020-03-16 MED ORDER — CLOPIDOGREL BISULFATE 75 MG PO TABS
75.0000 mg | ORAL_TABLET | Freq: Every day | ORAL | Status: DC
  Administered 2020-03-17 – 2020-03-20 (×4): 75 mg via ORAL
  Filled 2020-03-16 (×4): qty 1

## 2020-03-16 MED ORDER — PHENYLEPHRINE HCL-NACL 10-0.9 MG/250ML-% IV SOLN
INTRAVENOUS | Status: AC
  Filled 2020-03-16: qty 500

## 2020-03-16 MED ORDER — DIPHENHYDRAMINE HCL 12.5 MG/5ML PO ELIX: 12.5000 mg | ORAL_SOLUTION | ORAL | Status: DC | PRN

## 2020-03-16 MED ORDER — FENTANYL CITRATE (PF) 250 MCG/5ML IJ SOLN
INTRAMUSCULAR | Status: DC | PRN
  Administered 2020-03-16 (×2): 25 ug via INTRAVENOUS

## 2020-03-16 MED ORDER — METOCLOPRAMIDE HCL 5 MG/ML IJ SOLN: 5.0000 mg | Freq: Three times a day (TID) | INTRAMUSCULAR | Status: DC | PRN

## 2020-03-16 MED ORDER — EPHEDRINE SULFATE-NACL 50-0.9 MG/10ML-% IV SOSY
PREFILLED_SYRINGE | INTRAVENOUS | Status: DC | PRN
  Administered 2020-03-16: 5 mg via INTRAVENOUS
  Administered 2020-03-16: 10 mg via INTRAVENOUS

## 2020-03-16 MED ORDER — PROPOFOL 500 MG/50ML IV EMUL
INTRAVENOUS | Status: DC | PRN
  Administered 2020-03-16: 70 ug/kg/min via INTRAVENOUS

## 2020-03-16 MED ORDER — EPHEDRINE 5 MG/ML INJ
INTRAVENOUS | Status: AC
  Filled 2020-03-16: qty 10

## 2020-03-16 MED ORDER — PANTOPRAZOLE SODIUM 40 MG PO TBEC
40.0000 mg | DELAYED_RELEASE_TABLET | Freq: Every day | ORAL | Status: DC
  Administered 2020-03-16 – 2020-03-20 (×5): 40 mg via ORAL
  Filled 2020-03-16 (×5): qty 1

## 2020-03-16 MED ORDER — ALUM & MAG HYDROXIDE-SIMETH 200-200-20 MG/5ML PO SUSP
30.0000 mL | ORAL | Status: DC | PRN
Start: 2020-03-16 — End: 2020-03-20

## 2020-03-16 MED ORDER — VITAMIN D 25 MCG (1000 UNIT) PO TABS
1000.0000 [IU] | ORAL_TABLET | Freq: Every day | ORAL | Status: DC
  Administered 2020-03-17 – 2020-03-20 (×4): 1000 [IU] via ORAL
  Filled 2020-03-16 (×4): qty 1

## 2020-03-16 MED ORDER — APIXABAN 5 MG PO TABS
5.0000 mg | ORAL_TABLET | Freq: Two times a day (BID) | ORAL | Status: DC
  Administered 2020-03-17 – 2020-03-20 (×7): 5 mg via ORAL
  Filled 2020-03-16 (×7): qty 1

## 2020-03-16 MED ORDER — 0.9 % SODIUM CHLORIDE (POUR BTL) OPTIME
TOPICAL | Status: DC | PRN
  Administered 2020-03-16: 1000 mL

## 2020-03-16 MED ORDER — METHOCARBAMOL 1000 MG/10ML IJ SOLN
500.0000 mg | Freq: Four times a day (QID) | INTRAVENOUS | Status: DC | PRN
  Filled 2020-03-16: qty 5

## 2020-03-16 MED ORDER — ALBUMIN HUMAN 5 % IV SOLN: INTRAVENOUS | Status: DC | PRN

## 2020-03-16 MED ORDER — ONDANSETRON HCL 4 MG/2ML IJ SOLN: 4.0000 mg | Freq: Four times a day (QID) | INTRAMUSCULAR | Status: DC | PRN

## 2020-03-16 MED ORDER — ACETAMINOPHEN 325 MG PO TABS
325.0000 mg | ORAL_TABLET | Freq: Four times a day (QID) | ORAL | Status: DC | PRN
  Administered 2020-03-17 – 2020-03-20 (×5): 650 mg via ORAL
  Filled 2020-03-16 (×5): qty 2

## 2020-03-16 SURGICAL SUPPLY — 57 items
APL SKNCLS STERI-STRIP NONHPOA (GAUZE/BANDAGES/DRESSINGS)
BENZOIN TINCTURE PRP APPL 2/3 (GAUZE/BANDAGES/DRESSINGS) ×1 IMPLANT
BLADE CLIPPER SURG (BLADE) IMPLANT
BLADE SAW SGTL 18X1.27X75 (BLADE) ×2 IMPLANT
COLLAR OFFSET CORAIL SZ 10 HIP (Stem) IMPLANT
CORAIL OFFSET COLLAR SZ 10 HIP (Stem) ×2 IMPLANT
COVER SURGICAL LIGHT HANDLE (MISCELLANEOUS) ×2 IMPLANT
COVER WAND RF STERILE (DRAPES) ×1 IMPLANT
DRAPE C-ARM 42X72 X-RAY (DRAPES) ×2 IMPLANT
DRAPE STERI IOBAN 125X83 (DRAPES) ×2 IMPLANT
DRAPE U-SHAPE 47X51 STRL (DRAPES) ×6 IMPLANT
DRSG AQUACEL AG ADV 3.5X10 (GAUZE/BANDAGES/DRESSINGS) ×2 IMPLANT
DRSG XEROFORM 1X8 (GAUZE/BANDAGES/DRESSINGS) ×1 IMPLANT
DURAPREP 26ML APPLICATOR (WOUND CARE) ×2 IMPLANT
ELECT BLADE 4.0 EZ CLEAN MEGAD (MISCELLANEOUS) ×2
ELECT BLADE 6.5 EXT (BLADE) IMPLANT
ELECT REM PT RETURN 9FT ADLT (ELECTROSURGICAL) ×2
ELECTRODE BLDE 4.0 EZ CLN MEGD (MISCELLANEOUS) ×1 IMPLANT
ELECTRODE REM PT RTRN 9FT ADLT (ELECTROSURGICAL) ×1 IMPLANT
FACESHIELD WRAPAROUND (MASK) ×6 IMPLANT
FACESHIELD WRAPAROUND OR TEAM (MASK) ×2 IMPLANT
GLOVE ECLIPSE 8.0 STRL XLNG CF (GLOVE) ×2 IMPLANT
GLOVE ORTHO TXT STRL SZ7.5 (GLOVE) ×4 IMPLANT
GLOVE SRG 8 PF TXTR STRL LF DI (GLOVE) ×2 IMPLANT
GLOVE SURG UNDER POLY LF SZ8 (GLOVE) ×4
GOWN STRL REUS W/ TWL LRG LVL3 (GOWN DISPOSABLE) ×2 IMPLANT
GOWN STRL REUS W/ TWL XL LVL3 (GOWN DISPOSABLE) ×2 IMPLANT
GOWN STRL REUS W/TWL LRG LVL3 (GOWN DISPOSABLE) ×4
GOWN STRL REUS W/TWL XL LVL3 (GOWN DISPOSABLE) ×4
HANDPIECE INTERPULSE COAX TIP (DISPOSABLE) ×2
HEAD M SROM 36MM PLUS 1.5 (Hips) IMPLANT
KIT BASIN OR (CUSTOM PROCEDURE TRAY) ×2 IMPLANT
KIT TURNOVER KIT B (KITS) ×2 IMPLANT
LINER NEUTRAL 52X36MM PLUS 4 (Liner) ×1 IMPLANT
MANIFOLD NEPTUNE II (INSTRUMENTS) ×2 IMPLANT
NS IRRIG 1000ML POUR BTL (IV SOLUTION) ×2 IMPLANT
PACK TOTAL JOINT (CUSTOM PROCEDURE TRAY) ×2 IMPLANT
PAD ARMBOARD 7.5X6 YLW CONV (MISCELLANEOUS) ×2 IMPLANT
PIN SECTOR W/GRIP ACE CUP 52MM (Hips) ×1 IMPLANT
SET HNDPC FAN SPRY TIP SCT (DISPOSABLE) ×1 IMPLANT
SROM M HEAD 36MM PLUS 1.5 (Hips) ×2 IMPLANT
STAPLER VISISTAT 35W (STAPLE) IMPLANT
STRIP CLOSURE SKIN 1/2X4 (GAUZE/BANDAGES/DRESSINGS) ×2 IMPLANT
SUT ETHIBOND NAB CT1 #1 30IN (SUTURE) ×2 IMPLANT
SUT MNCRL AB 4-0 PS2 18 (SUTURE) IMPLANT
SUT VIC AB 0 CT1 27 (SUTURE) ×2
SUT VIC AB 0 CT1 27XBRD ANBCTR (SUTURE) ×1 IMPLANT
SUT VIC AB 1 CT1 27 (SUTURE) ×2
SUT VIC AB 1 CT1 27XBRD ANBCTR (SUTURE) ×1 IMPLANT
SUT VIC AB 2-0 CT1 27 (SUTURE) ×4
SUT VIC AB 2-0 CT1 TAPERPNT 27 (SUTURE) ×1 IMPLANT
TOWEL GREEN STERILE (TOWEL DISPOSABLE) ×2 IMPLANT
TOWEL GREEN STERILE FF (TOWEL DISPOSABLE) ×2 IMPLANT
TRAY CATH 16FR W/PLASTIC CATH (SET/KITS/TRAYS/PACK) IMPLANT
TRAY FOLEY W/BAG SLVR 16FR (SET/KITS/TRAYS/PACK) ×2
TRAY FOLEY W/BAG SLVR 16FR ST (SET/KITS/TRAYS/PACK) IMPLANT
WATER STERILE IRR 1000ML POUR (IV SOLUTION) ×3 IMPLANT

## 2020-03-16 NOTE — Anesthesia Postprocedure Evaluation (Signed)
Anesthesia Post Note  Patient: JESS SULAK  Procedure(s) Performed: LEFT TOTAL HIP ARTHROPLASTY ANTERIOR APPROACH (Left Hip)     Patient location during evaluation: PACU Anesthesia Type: MAC Level of consciousness: awake and alert and oriented Pain management: pain level controlled Vital Signs Assessment: post-procedure vital signs reviewed and stable Respiratory status: spontaneous breathing, nonlabored ventilation and respiratory function stable Cardiovascular status: blood pressure returned to baseline Postop Assessment: no apparent nausea or vomiting Anesthetic complications: no   No complications documented.  Last Vitals:  Vitals:   03/16/20 1441 03/16/20 1445  BP: (!) 122/54   Pulse: 64 65  Resp: 14 15  Temp:    SpO2: 93% 93%    Last Pain:  Vitals:   03/16/20 1430  TempSrc:   PainSc: 0-No pain                 Brennan Bailey

## 2020-03-16 NOTE — Anesthesia Procedure Notes (Addendum)
Spinal  Patient location during procedure: OR Start time: 03/16/2020 12:15 PM End time: 03/16/2020 12:18 PM Staffing Performed: anesthesiologist  Anesthesiologist: Brennan Bailey, MD Preanesthetic Checklist Completed: patient identified, IV checked, risks and benefits discussed, surgical consent, monitors and equipment checked, pre-op evaluation and timeout performed Spinal Block Patient position: sitting Prep: DuraPrep and site prepped and draped Patient monitoring: continuous pulse ox, blood pressure and heart rate Approach: midline Location: L4-5 Injection technique: single-shot Needle Needle type: Pencan  Needle gauge: 24 G Needle length: 9 cm Additional Notes Risks, benefits, and alternative discussed. Patient gave consent to procedure. Prepped and draped in sitting position. Patient sedated but responsive to voice. Clear CSF obtained after one needle redirection. Positive terminal aspiration. No pain or paraesthesias with injection. Patient tolerated procedure well. Vital signs stable. Tawny Asal, MD

## 2020-03-16 NOTE — H&P (Signed)
TOTAL HIP ADMISSION H&P  Patient is admitted for left total hip arthroplasty.  Subjective:  Chief Complaint: left hip pain  HPI: Heather [name], 87 y.o. female, has a history of pain and functional disability in the left hip(s) due to arthritis and patient has failed non-surgical conservative treatments for greater than 12 weeks to include NSAID's and/or analgesics, corticosteriod injections, flexibility and strengthening excercises, use of assistive devices, weight reduction as appropriate and activity modification.  Onset of symptoms was gradual starting 2 years ago with gradually worsening course since that time.The patient noted no past surgery on the left hip(s).  Patient currently rates pain in the left hip at 10 out of 10 with activity. Patient has night pain, worsening of pain with activity and weight bearing, trendelenberg gait, pain that interfers with activities of daily living, pain with passive range of motion and crepitus. Patient has evidence of subchondral cysts, subchondral sclerosis, periarticular osteophytes and joint space narrowing by imaging studies. This condition presents safety issues increasing the risk of falls.  There is no current active infection.  Patient Active Problem List   Diagnosis Date Noted  . Arthritis of left hip 03/16/2020  . Hypertension   . GERD (gastroesophageal reflux disease)   . Coronary artery disease   . Chronic diastolic CHF (congestive heart failure) ([place])   . Arthritis   . Pacemaker 09/15/2019  . Sinus node dysfunction (HCC)   . Chest pain 08/20/2019  . Elevated troponin   . Dyspnea 01/03/2016  . Anxiety attack 01/03/2016  . PAF (paroxysmal atrial fibrillation) ([place]) 01/03/2016  . Aphasia   . Other hyperlipidemia   . NSTEMI (non-ST elevated myocardial infarction) ([place]) 04/30/2015  . Hypertension, uncontrolled 07/19/2014  . H/O mitral valve repair   . CAD (coronary artery disease) of artery bypass graft, s/p PCI stent to VG->RCA  04/30/15 07/14/2013  . Obesity (BMI 30-39.9) 07/14/2013  . Hyperlipidemia 07/14/2013  . Hypothyroidism 07/14/2013  . Hypertensive heart disease 07/14/2013  . CAD (coronary artery disease), native coronary artery 07/14/2013  . S/P partial hysterectomy 1977   Past Medical History:  Diagnosis Date  . Anxiety attack 01/03/2016  . Aphasia 01/03/2016  . Arthritis   . CAD (coronary artery disease) of artery bypass graft, s/p PCI stent to VG->RCA 04/30/15 07/14/2013   Catheterization August/2003 showing severe three-vessel disease and moderate mitral regurgitation 08/28/2001 CABG with internal mammary graft to LAD, vein graft to circumflex, and vein graft to posterolateral branch and mitral valve repair by Dr. 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 Patent internal ma  . CAD (coronary artery disease), native coronary artery 07/14/2013   Catheterization August/2003 showing severe three-vessel disease and moderate mitral regurgitation 08/28/2001 CABG with internal mammary graft to LAD, vein graft to circumflex, and vein graft to posterolateral branch and mitral valve repair by Dr. 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 Patent internal mammary graft with competitive flow in the distal vessel, occluded right coronary artery, occluded circumflex, patent saphenous vein graft to circumflex with mild/moderate disease proximally, minimal LV damage   . Chest pain 08/20/2019  . Chronic diastolic CHF (congestive heart failure) (HCC)    a. EF 60% by LV gram 01/2009.  . Coronary artery disease    a. s/p MI in 1980;  b. 2003 CABG x 3 (LIMA->LAD, VG->OM, VG->RPL);  c.  01/2009 Cath: 3/3 patent grafts, native 3VD, EF 60%.  . Diverticulitis   . Dyspnea 01/03/2016  . Elevated troponin   . GERD (gastroesophageal reflux disease)   . H/O  mitral valve repair    a. 2003  . History of kidney stones   . Hyperlipidemia 07/14/2013   Intolerance to multiple statins including Crestor, pravastatin, and Lipitor   . Hypertension   . Hypertension, uncontrolled 07/19/2014  . Hypertensive heart disease 07/14/2013  . Hypothyroidism   . NSTEMI (non-ST elevated myocardial infarction) ([place])   . Other hyperlipidemia   . PAF (paroxysmal atrial fibrillation) ([place])   . Pneumonia   . Presence of permanent cardiac pacemaker 08/22/2019  . S/P partial hysterectomy 1977  . Sinus node dysfunction [facility])     Past Surgical History:  Procedure Laterality Date  . ABDOMINAL HYSTERECTOMY  1977  . APPENDECTOMY  1950  . BLADDER SUSPENSION     tac  . CARDIAC CATHETERIZATION  03,05,11  . CARDIAC CATHETERIZATION N/A 04/30/2015   Procedure: Left Heart Cath and Cors/Grafts Angiography;  Surgeon: Belva Crome, MD;  Location: [facility];  Service: Cardiovascular;  Laterality: N/A;  . CARDIAC CATHETERIZATION N/A 04/30/2015   Procedure: Coronary Stent Intervention;  Surgeon: Belva Crome, MD;  Location: [place] [facility];  Service: Cardiovascular;  Laterality: N/A;  Proximal and Distal SVG to the PLA  . CARDIAC CATHETERIZATION N/A 01/06/2016   Procedure: Left Heart Cath and Cors/Grafts Angiography;  Surgeon: Jettie Booze, MD;  Location: [place] [facility];  Service: Cardiovascular;  Laterality: N/A;  . CARPAL TUNNEL RELEASE     right and left  . CHOLECYSTECTOMY    . CORONARY ARTERY BYPASS GRAFT  2003  . KNEE ARTHROSCOPY     left  . LEFT HEART CATHETERIZATION WITH CORONARY ANGIOGRAM N/A 07/12/2013   Procedure: LEFT HEART CATHETERIZATION WITH CORONARY ANGIOGRAM;  Surgeon: Jettie Booze, MD;  Location[facility];  Service: Cardiovascular;  Laterality: N/A;  . MITRAL VALVE REPAIR  2003   with cabg  . PACEMAKER IMPLANT N/A 08/22/2019   Procedure: PACEMAKER IMPLANT;  Surgeon: Constance Haw, MD;  Location: [place] [facility];  Service:  Cardiovascular;  Laterality: N/A;  . THYROIDECTOMY  1968  . THYROIDECTOMY, PARTIAL     left    Current Facility-Administered Medications  Medication Dose Route Frequency Provider Last Rate Last Admin  . acetaminophen (TYLENOL) tablet 1,000 mg  1,000 mg Oral Once Brennan Bailey, MD      . ceFAZolin (ANCEF) IVPB 2g/100 mL premix  2 g Intravenous On Call to OR Pete Pelt, PA-C      . lactated ringers infusion   Intravenous Continuous Suzette Battiest, MD      . tranexamic acid (CYKLOKAPRON) IVPB 1,000 mg  1,000 mg Intravenous To OR Pete Pelt, PA-C       Allergies  Allergen Reactions  . Atorvastatin     Muscle ache  . Codeine Nausea And Vomiting  . Crestor [Rosuvastatin]     Muscle ache  . Pravastatin     Muscle ache  . Simvastatin     Muscle cramps   . Ticagrelor     Dyspnea     Social History   Tobacco Use  . Smoking status: Former Smoker    Quit date: 06/06/1967    Years since quitting: 52.8  . Smokeless tobacco: Former Network engineer Use Topics  . Alcohol use: No    Family History  Problem Relation Age of Onset  . Dementia Mother   . Heart Problems Father   . Dementia Sister   . Heart Problems Sister   . Other Other        no premature CAD.  . Tuberculosis Maternal Grandmother   . Heart Problems Paternal Grandfather      Review of Systems  Objective:  Physical Exam Vitals reviewed.  Constitutional:      Appearance: Normal appearance.  HENT:     Head: Normocephalic and atraumatic.  Eyes:     Pupils: Pupils are equal, round, and reactive to light.  Cardiovascular:     Rate and Rhythm: Normal rate.     Pulses: Normal pulses.  Pulmonary:     Effort: Pulmonary effort is normal.  Abdominal:     Palpations: Abdomen is soft.  Musculoskeletal:     Cervical back: Normal range of motion.     Left hip: Tenderness present. Decreased range of motion. Decreased strength.  Neurological:     Mental Status: She is alert and oriented to person,  place, and time.  Psychiatric:        Behavior: Behavior normal.     Vital signs in last 24 hours: Temp:  [98.3 F (36.8 C)] 98.3 F (36.8 C) (03/01 1012) Pulse Rate:  [80] 80 (03/01 1012) Resp:  [18] 18 (03/01 1012) BP: (168)/(65) 168/65 (03/01 1012) SpO2:  [100 %] 100 % (03/01 1012) Weight:  [79.8 kg] 79.8 kg (03/01 1012)  Labs:   Estimated body mass index is 35.55 kg/m as calculated from the following:   Height as of this encounter: '4\' 11"'$  (1.499 m).   Weight as of this encounter: 79.8 kg.   Imaging Review Plain radiographs demonstrate severe degenerative joint disease of the left hip(s). The bone quality appears to be fair for age and reported activity level.      Assessment/Plan:  End stage arthritis, left hip(s)  The patient history, physical examination, clinical judgement of the provider and imaging studies are consistent with end stage degenerative joint disease of the left hip(s) and total hip arthroplasty is deemed medically necessary. The treatment options including medical management, injection therapy, arthroscopy and arthroplasty were discussed at length. The risks and benefits of total hip arthroplasty were presented and reviewed. The risks due to aseptic loosening, infection, stiffness, dislocation/subluxation,  thromboembolic complications and other imponderables were discussed.  The patient acknowledged the explanation, agreed to proceed with the plan and consent was signed. Patient is being admitted for inpatient treatment for surgery, pain control, PT, OT, prophylactic antibiotics, VTE prophylaxis, progressive ambulation and ADL's and discharge planning.The patient is planning to be discharged home with home health services vs short-term skilled nursing.

## 2020-03-16 NOTE — Transfer of Care (Signed)
Immediate Anesthesia Transfer of Care Note  Patient: Heather [name]  Procedure(s) Performed: LEFT TOTAL HIP ARTHROPLASTY ANTERIOR APPROACH (Left Hip)  Patient Location: PACU  Anesthesia Type:MAC and Spinal  Level of Consciousness: awake, alert , oriented and patient cooperative  Airway & Oxygen Therapy: Patient Spontanous Breathing  Post-op Assessment: Report given to RN and Post -op Vital signs reviewed and stable  Post vital signs: Reviewed and stable  Last Vitals:  Vitals Value Taken Time  BP 97/46 03/16/20 1356  Temp    Pulse 71 03/16/20 1356  Resp 17 03/16/20 1356  SpO2 97 % 03/16/20 1356  Vitals shown include unvalidated device data.  Last Pain:  Vitals:   03/16/20 1021  TempSrc:   PainSc: 10-Worst pain ever      Patients Stated Pain Goal: 4 (31/54/00 8676)  Complications: No complications documented.

## 2020-03-16 NOTE — Brief Op Note (Signed)
03/16/2020  1:33 PM  PATIENT:  Heather [name]  87 y.o. female  PRE-OPERATIVE DIAGNOSIS:  osteoarthritis left hip  POST-OPERATIVE DIAGNOSIS:  osteoarthritis left hip  PROCEDURE:  Procedure(s): LEFT TOTAL HIP ARTHROPLASTY ANTERIOR APPROACH (Left)  SURGEON:  Surgeon(s) and Role:    Mcarthur Rossetti, MD - Primary  PHYSICIAN ASSISTANT:  Benita Stabile, PA-C  ANESTHESIA:   spinal  EBL:  150 mL   COUNTS:  YES  DICTATION: .Other Dictation: Dictation Number [id number]  PLAN OF CARE: Admit for overnight observation  PATIENT DISPOSITION:  PACU - hemodynamically stable.   Delay start of Pharmacological VTE agent (>24hrs) due to surgical blood loss or risk of bleeding: no

## 2020-03-16 NOTE — Progress Notes (Signed)
Pt arrived to unit from PACU accompanied by staff. Pt situated/oreintated to room/equipments. Welcome guide/menu provided with instructions. Pt verbalizes understanding of instructions. No complaints. Hospital valuables policy has been discussed withpt and family. Bed in lowest  Position with 3 side rails up, call bell/room phone within reach. And all wheels locked.

## 2020-03-16 NOTE — Evaluation (Signed)
Physical Therapy Evaluation Patient Details Name: Heather [name] MRN: [id number] DOB: 1930/03/16 Today's Date: 03/16/2020   History of Present Illness  Pt is 87 yo female s/p L anterior THA on 03/16/20.  She has PMH including arthritis L hip, HTN, GERD, CAD, pacemaker.  Clinical Impression  Pt is s/p anterior L THA resulting in the deficits listed below (see PT Problem List). Pt is very motivated and was independent with rollator at baseline.  She lives alone but does have multiple family members who could provide 24 hr support at d/c.  She reports plan is for d/c to rehab initially.   At evaluation, pt required min A of 2 for safety and was only able to ambulate 3' limited due to orthostatic hypotension and pain.  Pt expected to progress well with therapy.  Pt will benefit from skilled PT to increase their independence and safety with mobility to allow discharge to the venue listed below.      Follow Up Recommendations Follow surgeon's recommendation for DC plan and follow-up therapies;Supervision/Assistance - 24 hour    Equipment Recommendations  Rolling walker with 5" wheels (states has rollator and standard walker but not RW)    Recommendations for Other Services       Precautions / Restrictions Precautions Precautions: Fall      Mobility  Bed Mobility Overal bed mobility: Needs Assistance Bed Mobility: Supine to Sit;Sit to Supine     Supine to sit: HOB elevated;Min assist Sit to supine: Mod assist;+2 for physical assistance   General bed mobility comments: Supine to sit: cues , increased time, and min A to scoot forward and assist L Leg.  Sit to Supine: helicopter technique due to orthostatic hypotension    Transfers Overall transfer level: Needs assistance Equipment used: Rolling walker (2 wheeled) Transfers: Sit to/from [facility] Sit to Stand: Min assist;+2 safety/equipment Stand pivot transfers: +2 safety/equipment;Min assist       General  transfer comment: Performed sit to stand from bed and from straight back chair with min A to rise and cues for hand placement.  Min A x 2 to pivot back to bed with increased cues for hand placement on walker  Ambulation/Gait Ambulation/Gait assistance: Min assist;+2 safety/equipment Gait Distance (Feet): 3 Feet Assistive device: Rolling walker (2 wheeled) Gait Pattern/deviations: Step-to pattern;Decreased stride length;Decreased stance time - left;Decreased weight shift to left Gait velocity: decreased   General Gait Details: Min A to advance L LE, cues for RW proximity, only able to take 4 steps and became lightheaded requiring chair and transfer back to bed  Stairs            Wheelchair Mobility    Modified Rankin (Stroke Patients Only)       Balance Overall balance assessment: Needs assistance Sitting-balance support: No upper extremity supported Sitting balance-Leahy Scale: Fair     Standing balance support: Bilateral upper extremity supported Standing balance-Leahy Scale: Poor Standing balance comment: Needs RW                             Pertinent Vitals/Pain Pain Assessment: 0-10 Pain Score: 6  (6/10 pre; 10/10 post walk) Pain Location: L hip Pain Descriptors / Indicators: Discomfort;Sore;Throbbing Pain Intervention(s): Limited activity within patient's tolerance;Monitored during session;Repositioned;Ice applied;Patient requesting pain meds-RN notified    Home Living Family/patient expects to be discharged to:: Skilled nursing facility (reports planned SNF at d/c) Living Arrangements: Alone Available Help at Discharge: Family;Available 24 hours/day (  does have son and dtr in law that can assist) Type of Home: House Home Access: [place]: One level Home Equipment: [place] - 4 wheels;Walker - standard;Shower seat;Grab bars - toilet;Grab bars - tub/shower;Bedside commode      Prior Function Level of Independence: Independent  with assistive device(s)   Gait / Transfers Assistance Needed: Reports uses rollator for ambulation.  Likes rollator b/c she can use seat to carry items.  Denies falls.  States could ambulate community distances but does not go out due to fear of COVID.  ADL's / Homemaking Assistance Needed: Independent with ADLs, Light-mod IADLs, driving.  Has assist with shopping but only due to fear of COVID  Comments: Reports pain with L hip for 2 years     Hand Dominance        Extremity/Trunk Assessment   Upper Extremity Assessment Upper Extremity Assessment: Overall WFL for tasks assessed    Lower Extremity Assessment Lower Extremity Assessment: LLE deficits/detail LLE Deficits / Details: Expected post op deficits: MMT: ankle 5/5, knee 3/5 not further tested, hip 1/5    Cervical / Trunk Assessment Cervical / Trunk Assessment: Normal  Communication   Communication: No difficulties  Cognition Arousal/Alertness: Awake/alert Behavior During Therapy: WFL for tasks assessed/performed Overall Cognitive Status: Within Functional Limits for tasks assessed                                 General Comments: motivated      General Comments General comments (skin integrity, edema, etc.): Pt educated on PT role, POC, and importance of early mobility.  She was willing to try to do what she could.  Pt had c/o dizziness upon sitting with BP of 123/55 and dizziness improved.  She was able to stand and tolerated; however, developed lightheadedness after a few steps and returned to sitting.  BP was 94/64 in sitting.  Slid chair next to bed, performed stand pivot, and return to supine.  In supine, pt began to feel better and BP up to 112/53.  HR and O2 sats stable.    Exercises     Assessment/Plan    PT Assessment Patient needs continued PT services  PT Problem List Decreased strength;Decreased mobility;Decreased range of motion;Decreased activity tolerance;Cardiopulmonary status limiting  activity;Decreased balance;Decreased knowledge of use of DME;Pain       PT Treatment Interventions DME instruction;Therapeutic activities;Modalities;Gait training;Therapeutic exercise;Patient/family education;Balance training;Functional mobility training    PT Goals (Current goals can be found in the Care Plan section)  Acute Rehab PT Goals Patient Stated Goal: decrease hip pain, walk better PT Goal Formulation: With patient/family Time For Goal Achievement: 03/30/20 Potential to Achieve Goals: Good    Frequency 7X/week   Barriers to discharge        Co-evaluation               AM-PAC PT "6 Clicks" Mobility  Outcome Measure Help needed turning from your back to your side while in a flat bed without using bedrails?: A Little Help needed moving from lying on your back to sitting on the side of a flat bed without using bedrails?: A Little Help needed moving to and from a bed to a chair (including a wheelchair)?: A Little Help needed standing up from a chair using your arms (e.g., wheelchair or bedside chair)?: A Little Help needed to walk in hospital room?: A Little Help needed climbing 3-5 steps  with a railing? : A Lot 6 Click Score: 17    End of Session Equipment Utilized During Treatment: Gait belt Activity Tolerance: Other (comment) (limited by orthostatic hypotension) Patient left: in bed;with call bell/phone within reach;with bed alarm set;with family/visitor present;with SCD's reapplied Nurse Communication: Patient requests pain meds;Mobility status PT Visit Diagnosis: Other abnormalities of gait and mobility (R26.89);Muscle weakness (generalized) (M62.81)    Time: [id number] PT Time Calculation (min) (ACUTE ONLY): 1350 min   Charges:   PT Evaluation $PT Eval Moderate Complexity: 1 Mod PT Treatments $Gait Training: 8-22 mins        Abran Richard, PT Acute Rehab Services Pager [phone] [facility] [place] 03/16/2020, 5:49  PM

## 2020-03-17 ENCOUNTER — Encounter (HOSPITAL_COMMUNITY): Payer: Self-pay | Admitting: Orthopaedic Surgery

## 2020-03-17 DIAGNOSIS — I252 Old myocardial infarction: Secondary | ICD-10-CM | POA: Diagnosis not present

## 2020-03-17 DIAGNOSIS — E7849 Other hyperlipidemia: Secondary | ICD-10-CM | POA: Diagnosis present

## 2020-03-17 DIAGNOSIS — Z90711 Acquired absence of uterus with remaining cervical stump: Secondary | ICD-10-CM | POA: Diagnosis not present

## 2020-03-17 DIAGNOSIS — I480 Paroxysmal atrial fibrillation: Secondary | ICD-10-CM | POA: Diagnosis present

## 2020-03-17 DIAGNOSIS — Z87891 Personal history of nicotine dependence: Secondary | ICD-10-CM | POA: Diagnosis not present

## 2020-03-17 DIAGNOSIS — I110 Hypertensive heart disease with heart failure: Secondary | ICD-10-CM | POA: Diagnosis present

## 2020-03-17 DIAGNOSIS — I2510 Atherosclerotic heart disease of native coronary artery without angina pectoris: Secondary | ICD-10-CM | POA: Diagnosis present

## 2020-03-17 DIAGNOSIS — Z950 Presence of cardiac pacemaker: Secondary | ICD-10-CM | POA: Diagnosis not present

## 2020-03-17 DIAGNOSIS — F411 Generalized anxiety disorder: Secondary | ICD-10-CM | POA: Diagnosis present

## 2020-03-17 DIAGNOSIS — Z9049 Acquired absence of other specified parts of digestive tract: Secondary | ICD-10-CM | POA: Diagnosis not present

## 2020-03-17 DIAGNOSIS — E039 Hypothyroidism, unspecified: Secondary | ICD-10-CM | POA: Diagnosis present

## 2020-03-17 DIAGNOSIS — Z87442 Personal history of urinary calculi: Secondary | ICD-10-CM | POA: Diagnosis not present

## 2020-03-17 DIAGNOSIS — R4701 Aphasia: Secondary | ICD-10-CM | POA: Diagnosis present

## 2020-03-17 DIAGNOSIS — Z20822 Contact with and (suspected) exposure to covid-19: Secondary | ICD-10-CM | POA: Diagnosis present

## 2020-03-17 DIAGNOSIS — M1612 Unilateral primary osteoarthritis, left hip: Secondary | ICD-10-CM | POA: Diagnosis present

## 2020-03-17 DIAGNOSIS — Z888 Allergy status to other drugs, medicaments and biological substances status: Secondary | ICD-10-CM | POA: Diagnosis not present

## 2020-03-17 DIAGNOSIS — K219 Gastro-esophageal reflux disease without esophagitis: Secondary | ICD-10-CM | POA: Diagnosis present

## 2020-03-17 DIAGNOSIS — E890 Postprocedural hypothyroidism: Secondary | ICD-10-CM | POA: Diagnosis present

## 2020-03-17 DIAGNOSIS — Z8249 Family history of ischemic heart disease and other diseases of the circulatory system: Secondary | ICD-10-CM | POA: Diagnosis not present

## 2020-03-17 DIAGNOSIS — I25810 Atherosclerosis of coronary artery bypass graft(s) without angina pectoris: Secondary | ICD-10-CM | POA: Diagnosis present

## 2020-03-17 DIAGNOSIS — I5032 Chronic diastolic (congestive) heart failure: Secondary | ICD-10-CM | POA: Diagnosis present

## 2020-03-17 DIAGNOSIS — I340 Nonrheumatic mitral (valve) insufficiency: Secondary | ICD-10-CM | POA: Diagnosis present

## 2020-03-17 DIAGNOSIS — Z9861 Coronary angioplasty status: Secondary | ICD-10-CM | POA: Diagnosis not present

## 2020-03-17 LAB — CBC
HCT: 29.6 % — ABNORMAL LOW (ref 36.0–46.0)
Hemoglobin: 10 g/dL — ABNORMAL LOW (ref 12.0–15.0)
MCH: 30.4 pg (ref 26.0–34.0)
MCHC: 33.8 g/dL (ref 30.0–36.0)
MCV: 90 fL (ref 80.0–100.0)
Platelets: 183 10*3/uL (ref 150–400)
RBC: 3.29 MIL/uL — ABNORMAL LOW (ref 3.87–5.11)
RDW: 13.2 % (ref 11.5–15.5)
WBC: 9.7 10*3/uL (ref 4.0–10.5)
nRBC: 0 % (ref 0.0–0.2)

## 2020-03-17 LAB — BASIC METABOLIC PANEL
Anion gap: 9 (ref 5–15)
BUN: 12 mg/dL (ref 8–23)
CO2: 25 mmol/L (ref 22–32)
Calcium: 8.9 mg/dL (ref 8.9–10.3)
Chloride: 101 mmol/L (ref 98–111)
Creatinine, Ser: 0.79 mg/dL (ref 0.44–1.00)
GFR, Estimated: >60 mL/min (ref >60)
Glucose, Bld: 138 mg/dL — ABNORMAL HIGH (ref 70–99)
Potassium: 4.5 mmol/L (ref 3.5–5.1)
Sodium: 135 mmol/L (ref 135–145)

## 2020-03-17 NOTE — Plan of Care (Signed)
  Problem: Activity: Goal: Risk for activity intolerance will decrease Outcome: Progressing   Problem: Safety: Goal: Ability to remain free from injury will improve Outcome: Progressing   Problem: Pain Managment: Goal: General experience of comfort will improve Outcome: Progressing   Problem: Elimination: Goal: Will not experience complications related to bowel motility Outcome: Progressing Goal: Will not experience complications related to urinary retention Outcome: Progressing

## 2020-03-17 NOTE — Op Note (Signed)
NAME: Heather [name], Heather [name] MEDICAL RECORD NO: [id number] ACCOUNT NO: [id number] DATE OF BIRTH: Aug 10, 1930 FACILITY: [facility] LOCATION: [facility] PHYSICIAN: Lind Guest. Ninfa Linden, MD  Operative Report   DATE OF PROCEDURE: 03/16/2020   PREOPERATIVE DIAGNOSIS:  Primary osteoarthritis and degenerative joint disease, left hip.  POSTOPERATIVE DIAGNOSIS:  Primary osteoarthritis and degenerative joint disease, left hip.  PROCEDURE:  Left total hip arthroplasty through a direct anterior approach.  IMPLANTS:  DePuy sector Gription acetabular component size 52, size 36+4 neutral polyethylene liner, size 10 Corail femoral component with high offset, size 36+1.5 metal hip ball.  SURGEON:  Jean Rosenthal M.D.   ASSISTANT: Erskine Emery, PA-C  ANESTHESIA:  Spinal.  ANTIBIOTICS:  2 g IV Ancef.  ESTIMATED BLOOD LOSS:  [id number] mL  COMPLICATIONS:  None.  INDICATIONS:  The patient is an 87 year old female well known to me with debilitating end-stage arthritis involving her right hip.  It has become debilitating for her.  At this point, she does wish to proceed with a total hip arthroplasty.  I had to get  cardiac clearance for this and she is still intermediate risk, but her pain is so bad that her and her family wished for [place] to proceed with surgery.  This is detrimentally affecting her mobility, her quality of life and her activities of daily living.   It is really well documented on x-ray and clinical exam, the severity of arthritis.  We talked in length about the risk of acute blood loss anemia, nerve or vessel injury, fracture, infection, dislocation, DVT and implant failure as well as skin and soft  tissue issues. I told her about our goals being decreased pain, improved mobility and overall improved quality of life.  DESCRIPTION OF PROCEDURE:  After informed consent was obtained, appropriate left hip was marked.  She was brought to the operating room and sat up on a stretcher.  Spinal anesthesia was  obtained.  She was laid in supine position on the stretcher.  Foley  catheter was placed in both feet.  Traction boots applied to them.  Of note, clinically she is definitely shorter on the left than the right due to the severity of arthritis.  She was then placed supine on the Hana fracture table.  The perineal post was  placed in both legs in line skeletal traction device and no traction applied.  Her left operative hip was prepped and draped with DuraPrep and sterile drapes.  A timeout was called and she was identified correct patient, correct left hip.  I then made an  incision just inferior and posterior to the anterior superior iliac spine and carried this obliquely down the leg.  We dissected down the tensor fascia lata muscle.  Tensor fascia was then divided longitudinally to proceed with a right anterior approach  to the hip.  We identified and cauterized circumflex vessels, identified the hip capsule, opened the hip capsule in an L-type format and finding a moderate joint effusion and significant periarticular osteophytes around the femoral head and neck.  I  placed a Cobra retractor on the medial and lateral femoral neck and made our femoral neck cut with an oscillating saw and completed this with an osteotome.  I placed a corkscrew guide in the femoral head and removed the femoral head in its entirety and  found a wide area completely devoid of cartilage and significant flattening.  We then placed a bent Hohmann over the medial acetabular rim and removed the remnants of the acetabular labrum and other debris. I  then began reaming under direct visualization  from a size 43 reamer and stepwise increments up to a size 51 with all reamers placed under direct visualization and the last reamer was placed under direct fluoroscopy, so we could obtain our depth of reaming, our inclination and anteversion.  I then  placed a real DePuy sector Gription acetabular component size 52.  Given her medialization  we placed a 36+4 neutral polyethylene liner.  Attention was then turned to the femur with the leg externally rotated to 120 degrees, extension and adducted, we  then placed a Mueller retractor medially and a Hohmann retractor behind the greater trochanter.  We released the lateral joint capsule and used a box cutting osteotome to enter the femoral canal and a rongeur to lateralize.  We then began broaching using  the carotid broaching system from size 8 going up to just a size 10. With a size 10 and placed a trial standard offset femoral neck and a 36 -2 hip ball.  Since she was tight and we started off with a significantly shorter and reduced this in the  acetabulum and based on her exam and radiographic evidence we needed to go with more offset and leg length.  We dislocated the hip, removed the trial components.  We placed the real Corail femoral component, a size 10 with a high offset femoral neck  component and we went with a real 36+1.5 hip ball, reduced this and acetabulum, it was definitely tight, did not really appreciate her leg length and offset measured radiographically and clinically assessed as well.  It was stable on exam.  We then  irrigated the soft tissue with normal saline solution using pulsatile lavage.  I did close the joint capsule with interrupted #1 Ethibond suture followed by #1 Vicryl.  We closed the tensor fascia.  0 Vicryl was used to close the deep tissue and 2-0  Vicryl was used to close the subcutaneous tissue.  The skin was reapproximated with staples.  An Aquacel dressing was applied.  She was taken off the Hana table and taken to recovery room in stable condition.  All final counts being correct.  There were  no complications noted.   NOTE: Erskine Emery, PA-C, assisted in the entire case from beginning to end.  His assistance was crucial throughout every aspect of this case.   SUJ D: 03/16/2020 1:31:46 pm T: 03/17/2020 12:18:00 am  JOB: [id number]

## 2020-03-17 NOTE — Progress Notes (Signed)
Physical Therapy Treatment Patient Details Name: Heather [name] MRN: [id number] DOB: 11-18-1930 Today's Date: 03/17/2020    History of Present Illness Pt is 87 yo female s/p L anterior THA on 03/16/20.  She has PMH including arthritis L hip, HTN, GERD, CAD, pacemaker.    PT Comments    Continuing work on functional mobility and activity tolerance;  Session focused on increasing amb distance; in a considerable amount of pain, but participating well   Follow Up Recommendations  Follow surgeon's recommendation for DC plan and follow-up therapies;Supervision/Assistance - 24 hour     Equipment Recommendations  Rolling walker with 5" wheels;Other (comment) (Youth-sized)    Recommendations for Other Services       Precautions / Restrictions Precautions Precautions: Fall Restrictions LLE Weight Bearing: Weight bearing as tolerated    Mobility  Bed Mobility                    Transfers Overall transfer level: Needs assistance Equipment used: Rolling walker (2 wheeled) Transfers: Sit to/from Stand Sit to Stand: Min assist         General transfer comment: Cues for hand placement, in particular to push up from armrest (locked recliner beside pt) with RUE; overall slow, but good rise  Ambulation/Gait Ambulation/Gait assistance: Min assist;+2 safety/equipment (chair push) Gait Distance (Feet): 12 Feet (8+4, with seated rest break in between) Assistive device: Rolling walker (2 wheeled) Gait Pattern/deviations: Step-to pattern;Antalgic Gait velocity: decreased   General Gait Details: Min A to advance L LE, Cues for RW proximity, optimal step sequence, and to bear down into RW during L sance to [place] painful hip   Stairs             Wheelchair Mobility    Modified Rankin (Stroke Patients Only)       Balance     Sitting balance-Leahy Scale: Fair       Standing balance-Leahy Scale: Poor Standing balance comment: Needs RW                             Cognition Arousal/Alertness: Awake/alert Behavior During Therapy: WFL for tasks assessed/performed Overall Cognitive Status: Within Functional Limits for tasks assessed                                 General Comments: motivated      Exercises      General Comments        Pertinent Vitals/Pain Pain Assessment: 0-10 Pain Score: 10-Worst pain ever Pain Location: L hip, but pt reports still not as badk as preop Pain Descriptors / Indicators: Discomfort;Sore;Throbbing Pain Intervention(s): Monitored during session    Home Living                      Prior Function            PT Goals (current goals can now be found in the care plan section) Acute Rehab PT Goals Patient Stated Goal: decrease hip pain, walk better PT Goal Formulation: With patient/family Time For Goal Achievement: 03/30/20 Potential to Achieve Goals: Good Progress towards PT goals: Progressing toward goals    Frequency    7X/week      PT Plan Current plan remains appropriate    Co-evaluation              AM-PAC PT "6 Clicks" Mobility  Outcome Measure  Help needed turning from your back to your side while in a flat bed without using bedrails?: A Little Help needed moving from lying on your back to sitting on the side of a flat bed without using bedrails?: A Little Help needed moving to and from a bed to a chair (including a wheelchair)?: A Little Help needed standing up from a chair using your arms (e.g., wheelchair or bedside chair)?: A Little Help needed to walk in hospital room?: A Lot Help needed climbing 3-5 steps with a railing? : A Lot 6 Click Score: 16    End of Session Equipment Utilized During Treatment: Gait belt Activity Tolerance: Patient tolerated treatment well Patient left: in chair;with call bell/phone within reach;with nursing/sitter in room Nurse Communication: Mobility status PT Visit Diagnosis: Other abnormalities of gait and  mobility (R26.89);Muscle weakness (generalized) (M62.81)     Time: [id number] PT Time Calculation (min) (ACUTE ONLY): 25 min  Charges:  $Gait Training: 8-22 mins $Therapeutic Activity: 8-22 mins                     Roney Marion, PT  Acute Rehabilitation Services Pager [phone] Office [place] 03/17/2020, 4:49 PM

## 2020-03-17 NOTE — Progress Notes (Signed)
Physical Therapy Treatment Patient Details Name: Heather [name] MRN: [id number] DOB: 1930/05/13 Today's Date: 03/17/2020    History of Present Illness Pt is 87 yo female s/p L anterior THA on 03/16/20.  She has PMH including arthritis L hip, HTN, GERD, CAD, pacemaker.    PT Comments    Continuing work on functional mobility and activity tolerance;  Session included therex, transfers, and initial gait training, with a close eye on Orthostatic BPs, given dizziness yesterday; Overall needing min/mod assist for transfers and initial gait; requires assist to advance LLE for stepping -- this usually improves in the first few sessions; Orthostatic BPs check out well (See vitals flow sheet.)  Pt described her adult children (she seemed to enjoy talking about her family), including some of their ailments; At this point, I agree with Dr. Trevor Mace recommendation for SNF for post-acute rehab to maximize independence and safety with mobility and allow for safe dc home.   Follow Up Recommendations  Follow surgeon's recommendation for DC plan and follow-up therapies;Supervision/Assistance - 24 hour     Equipment Recommendations  Rolling walker with 5" wheels;Other (comment) (Youth-sized)    Recommendations for Other Services       Precautions / Restrictions Precautions Precautions: Fall Precaution Comments: Orthostatic BPs stable (see Vitals flowsheet) Restrictions Weight Bearing Restrictions: No LLE Weight Bearing: Weight bearing as tolerated    Mobility  Bed Mobility Overal bed mobility: Needs Assistance Bed Mobility: Supine to Sit     Supine to sit: HOB elevated;Min assist     General bed mobility comments: cues for technique; stiff and slow moving; used rails; Assist initially to help move LLE towards EOB, then assisted with bed pad to square off hips at EOB    Transfers Overall transfer level: Needs assistance Equipment used: Rolling walker (2 wheeled) Transfers: Sit to/from  [facility] Sit to Stand: Min assist Stand pivot transfers: Mod assist       General transfer comment: Cues for hand placement, in particular to push up from armrest (locked recliner beside pt) with RUE; overall good rise; Light mod assist for pivot steps to recliner on pt's R, needing assist to advance LLE for stepping  Ambulation/Gait Ambulation/Gait assistance: Min assist Gait Distance (Feet): 3 Feet Assistive device: Rolling walker (2 wheeled) Gait Pattern/deviations: Step-to pattern;Antalgic     General Gait Details: Min A to advance L LE, Cues for RW proximity, optimal step sequence, and to bear down into RW during L sance to [facility] painful hip   Stairs             Wheelchair Mobility    Modified Rankin (Stroke Patients Only)       Balance     Sitting balance-Leahy Scale: Fair       Standing balance-Leahy Scale: Poor Standing balance comment: Needs RW                            Cognition Arousal/Alertness: Awake/alert Behavior During Therapy: WFL for tasks assessed/performed Overall Cognitive Status: Within Functional Limits for tasks assessed                                 General Comments: motivated      Exercises Total Joint Exercises Ankle Circles/Pumps: AROM;Both;20 reps Quad Sets: AROM;Both;15 reps Gluteal Sets: AROM;Both;10 reps Short Arc QuadSinclair Ship;Left;10 reps Heel Slides: AAROM;Left;10 reps (more pain with return to extension)  Hip ABduction/ADduction: AAROM;Left;10 reps    General Comments General comments (skin integrity, edema, etc.): Session conducted on Room Air and O2 sats remained greater than or equal to 94%;   03/17/20 0923  Vital Signs  Patient Position (if appropriate) Orthostatic Vitals  Orthostatic Lying   BP- Lying 126/45 (Map 69)  Pulse- Lying 77  Orthostatic Sitting  BP- Sitting 125/54 (Map 76)  Pulse- Sitting 77  Orthostatic Standing at 0 minutes  BP- Standing at 0  minutes 138/76 (Map 88)  Pulse- Standing at 0 minutes 88  Orthostatic Standing at 3 minutes  BP- Standing at 3 minutes 127/65 (Map 81)  Pulse- Standing at 3 minutes 82        Pertinent Vitals/Pain Pain Assessment: 0-10 Pain Score: 6  Pain Location: L hip Pain Descriptors / Indicators: Discomfort;Sore;Throbbing Pain Intervention(s): Monitored during session;Repositioned    Home Living                      Prior Function            PT Goals (current goals can now be found in the care plan section) Acute Rehab PT Goals Patient Stated Goal: decrease hip pain, walk better PT Goal Formulation: With patient/family Time For Goal Achievement: 03/30/20 Potential to Achieve Goals: Good Progress towards PT goals: Progressing toward goals    Frequency    7X/week      PT Plan Current plan remains appropriate    Co-evaluation              AM-PAC PT "6 Clicks" Mobility   Outcome Measure  Help needed turning from your back to your side while in a flat bed without using bedrails?: A Little Help needed moving from lying on your back to sitting on the side of a flat bed without using bedrails?: A Little Help needed moving to and from a bed to a chair (including a wheelchair)?: A Little Help needed standing up from a chair using your arms (e.g., wheelchair or bedside chair)?: A Little Help needed to walk in hospital room?: A Lot Help needed climbing 3-5 steps with a railing? : A Lot 6 Click Score: 16    End of Session Equipment Utilized During Treatment: Gait belt Activity Tolerance: Patient tolerated treatment well Patient left: in chair;with call bell/phone within reach;with nursing/sitter in room Nurse Communication: Mobility status;Other (comment) (and orthostatics checked out ok) PT Visit Diagnosis: Other abnormalities of gait and mobility (R26.89);Muscle weakness (generalized) (M62.81)     Time: [id number] PT Time Calculation (min) (ACUTE ONLY): 38  min  Charges:  $Gait Training: 8-22 mins $Therapeutic Exercise: 8-22 mins $Therapeutic Activity: 8-22 mins                     Roney Marion, PT  Acute Rehabilitation Services Pager [phone] Office [place] 03/17/2020, 9:39 AM

## 2020-03-17 NOTE — Progress Notes (Signed)
Subjective: 1 Day Post-Op Procedure(s) (LRB): LEFT TOTAL HIP ARTHROPLASTY ANTERIOR APPROACH (Left) Patient reports pain as moderate.  Awake and alert this am.  Minimal acute blood loss anemia from surgery; tolerating well.  Objective: Vital signs in last 24 hours: Temp:  [97.2 F (36.2 C)-98.3 F (36.8 C)] 98 F (36.7 C) (03/02 0502) Pulse Rate:  [59-80] 62 (03/02 0502) Resp:  [13-20] 16 (03/02 0502) BP: (97-168)/(46-65) 135/50 (03/02 0502) SpO2:  [93 %-100 %] 93 % (03/02 0502) Weight:  [79.8 kg] 79.8 kg (03/01 1012)  Intake/Output from previous day: 03/01 0701 - 03/02 0700 In: 1006.3 [I.V.:506.3; IV Piggyback:500] Out: 585 [Urine:435; Blood:150] Intake/Output this shift: No intake/output data recorded.  Recent Labs    03/17/20 0448  HGB 10.0*   Recent Labs    03/17/20 0448  WBC 9.7  RBC 3.29*  HCT 29.6*  PLT 183   Recent Labs    03/17/20 0448  NA 135  K 4.5  CL 101  CO2 25  BUN 12  CREATININE 0.79  GLUCOSE 138*  CALCIUM 8.9   No results for input(s): LABPT, INR in the last 72 hours.  Sensation intact distally Intact pulses distally Dorsiflexion/Plantar flexion intact Incision: dressing C/D/I   Assessment/Plan: 1 Day Post-Op Procedure(s) (LRB): LEFT TOTAL HIP ARTHROPLASTY ANTERIOR APPROACH (Left) Up with therapy Patient and family requesting short-term SNF placement. Consult Transitional Care Team     Mcarthur Rossetti 03/17/2020, 7:56 AM

## 2020-03-18 MED ORDER — BISACODYL 10 MG RE SUPP
10.0000 mg | Freq: Every day | RECTAL | Status: DC | PRN
  Administered 2020-03-19: 10 mg via RECTAL
  Filled 2020-03-18: qty 1

## 2020-03-18 MED ORDER — POLYETHYLENE GLYCOL 3350 17 G PO PACK
17.0000 g | PACK | Freq: Every day | ORAL | Status: DC | PRN
  Administered 2020-03-18: 17 g via ORAL
  Filled 2020-03-18: qty 1

## 2020-03-18 NOTE — Progress Notes (Signed)
Subjective: 2 Days Post-Op Procedure(s) (LRB): LEFT TOTAL HIP ARTHROPLASTY ANTERIOR APPROACH (Left) Patient reports pain as moderate.  Reports she did better with PT today.   Objective: Vital signs in last 24 hours: Temp:  [98.2 F (36.8 C)-99.1 F (37.3 C)] 98.8 F (37.1 C) (03/03 0700) Pulse Rate:  [75-80] 80 (03/03 0700) Resp:  [17-20] 18 (03/03 0700) BP: (121-136)/(40-53) 136/53 (03/03 0700) SpO2:  [91 %-97 %] 94 % (03/03 0700)  Intake/Output from previous day: 03/02 0701 - 03/03 0700 In: -  Out: 1600 [Urine:1600] Intake/Output this shift: Total I/O In: 240 [P.O.:240] Out: 700 [Urine:700]  Recent Labs    03/17/20 0448  HGB 10.0*   Recent Labs    03/17/20 0448  WBC 9.7  RBC 3.29*  HCT 29.6*  PLT 183   Recent Labs    03/17/20 0448  NA 135  K 4.5  CL 101  CO2 25  BUN 12  CREATININE 0.79  GLUCOSE 138*  CALCIUM 8.9   No results for input(s): LABPT, INR in the last 72 hours.  Left lower extremity Incision: scant drainage Compartment soft   Assessment/Plan: 2 Days Post-Op Procedure(s) (LRB): LEFT TOTAL HIP ARTHROPLASTY ANTERIOR APPROACH (Left) Up with therapy  Awaiting Social worker to work on SNF placement      [employer] 03/18/2020, 12:23 PM

## 2020-03-18 NOTE — Progress Notes (Signed)
Patient ID: Heather [name], female   DOB: [birth year], 87 y.o.   MRN: [id number] The patient is awake and alert and sitting eating lunch.  She is comfortable.  She is mobilize some with physical therapy.  She does live alone and they have recommended that she does need 24-hour systems.  I did put in an order yesterday for the transitional care team to see the patient for the possibility of short-term skilled nursing placement.  As of right now they have not seen the patient.  With that being said, she will likely be here longer due to that delay.

## 2020-03-18 NOTE — Plan of Care (Signed)
  Problem: Pain Managment: Goal: General experience of comfort will improve Outcome: Progressing   Problem: Safety: Goal: Ability to remain free from injury will improve Outcome: Progressing   

## 2020-03-18 NOTE — Progress Notes (Signed)
Physical Therapy Treatment Patient Details Name: Heather [name] MRN: [id number] DOB: [birth year] Today's Date: 03/18/2020    History of Present Illness Pt is 87 yo female s/p L anterior THA on 03/16/20.  She has PMH including arthritis L hip, HTN, GERD, CAD, pacemaker.    PT Comments    Pt supine in bed on arrival this session.  Pt require mod to min assistance to move to edge of bed, OOB and gt training.  Pt able to pick her foot up during second 1/2 of gt trial, initial assistance needed to progress LLE.  Continue to follow acutely to maximize functional gains.    Follow Up Recommendations  Follow surgeon's recommendation for DC plan and follow-up therapies;Supervision/Assistance - 24 hour     Equipment Recommendations  Rolling walker with 5" wheels;Other (comment) (Youth Height)    Recommendations for Other Services       Precautions / Restrictions Precautions Precautions: Fall Restrictions Weight Bearing Restrictions: Yes LLE Weight Bearing: Weight bearing as tolerated    Mobility  Bed Mobility Overal bed mobility: Needs Assistance Bed Mobility: Supine to Sit     Supine to sit: HOB elevated;Min assist;Mod assist     General bed mobility comments: Min assistance to move into sitting, mod assistance to move hips to edge of bed with bed pad.    Transfers Overall transfer level: Needs assistance Equipment used: Rolling walker (2 wheeled) Transfers: Sit to/from Stand Sit to Stand: Min assist         General transfer comment: Cues for hand placement to and from seated surface this session.  Ambulation/Gait Ambulation/Gait assistance: Min assist Gait Distance (Feet): 40 Feet Assistive device: Rolling walker (2 wheeled) Gait Pattern/deviations: Step-to pattern;Antalgic     General Gait Details: Min A to advance L LE, Cues for RW proximity, optimal step sequence, and to bear down into RW during L sance to [place] painful hip.  As gt progressed able to move LLE  forward at times unassisted.   Stairs             Wheelchair Mobility    Modified Rankin (Stroke Patients Only)       Balance Overall balance assessment: Needs assistance Sitting-balance support: No upper extremity supported Sitting balance-Leahy Scale: Fair       Standing balance-Leahy Scale: Poor                              Cognition Arousal/Alertness: Awake/alert Behavior During Therapy: WFL for tasks assessed/performed Overall Cognitive Status: Within Functional Limits for tasks assessed                                 General Comments: motivated      Exercises General Exercises - Lower Extremity Ankle Circles/Pumps: AROM;Both;10 reps;Supine Quad Sets: AROM;Left;10 reps;Supine Long Arc Quad: AROM;Left;10 reps;Seated Heel Slides: AAROM;Left;10 reps;Supine Hip ABduction/ADduction: AAROM;10 reps;Left;Supine    General Comments        Pertinent Vitals/Pain Pain Assessment: 0-10 Pain Score: 9  Pain Location: L hip Pain Descriptors / Indicators: Discomfort;Sore;Throbbing Pain Intervention(s): Monitored during session;Repositioned    Home Living                      Prior Function            PT Goals (current goals can now be found in the care plan section) Acute  Rehab PT Goals Patient Stated Goal: decrease hip pain, walk better Potential to Achieve Goals: Good Progress towards PT goals: Progressing toward goals    Frequency    7X/week      PT Plan Current plan remains appropriate    Co-evaluation              AM-PAC PT "6 Clicks" Mobility   Outcome Measure  Help needed turning from your back to your side while in a flat bed without using bedrails?: A Little Help needed moving from lying on your back to sitting on the side of a flat bed without using bedrails?: A Little Help needed moving to and from a bed to a chair (including a wheelchair)?: A Little Help needed standing up from a chair  using your arms (e.g., wheelchair or bedside chair)?: A Little Help needed to walk in hospital room?: A Little Help needed climbing 3-5 steps with a railing? : A Lot 6 Click Score: 17    End of Session Equipment Utilized During Treatment: Gait belt Activity Tolerance: Patient tolerated treatment well Patient left: in chair;with call bell/phone within reach;with nursing/sitter in room Nurse Communication: Mobility status PT Visit Diagnosis: Other abnormalities of gait and mobility (R26.89);Muscle weakness (generalized) (M62.81)     Time: [id number] PT Time Calculation (min) (ACUTE ONLY): 23 min  Charges:  $Gait Training: 8-22 mins $Therapeutic Exercise: 8-22 mins                     Erasmo Leventhal , PTA Acute Rehabilitation Services Pager [phone] Office [phone]     Heather [name] 03/18/2020, 3:11 PM

## 2020-03-19 LAB — SARS CORONAVIRUS 2 (TAT 6-24 HRS): SARS Coronavirus 2: NEGATIVE

## 2020-03-19 MED ORDER — ACETAMINOPHEN 325 MG PO TABS: 325.0000 mg | ORAL_TABLET | Freq: Four times a day (QID) | ORAL | 0 refills | Status: AC | PRN

## 2020-03-19 NOTE — Progress Notes (Signed)
Patient ID: Heather [name], female   DOB: 11/08/1930, 87 y.o.   MRN: [id number] The patient has had no acute change in her medical status.  She is awake and alert this morning.  She has had very slow progress with therapy.  It is recommended that she at least have 24-hour supervision at home versus short-term skilled nursing.  She lives alone at home and we were hoping that she would be considered for short-term skilled nursing.  She has been here since Tuesday.  I have consulted the transitional care team twice daily for last 2-1/2 days.  No one from that department has seen her at all to even set up anything for home.  I will likely leave her in the hospital for several days for safety purposes and for continued therapy as well.  I did change her dressing today and her incision looks good.  She can shower from my standpoint.  I will check on her late this afternoon.  Her vital signs are stable as well.

## 2020-03-19 NOTE — NC FL2 (Signed)
[place] LEVEL OF CARE SCREENING TOOL     IDENTIFICATION  Patient Name: Heather [name] Birthdate: October 13, 1930 Sex: female Admission Date (Current Location): 03/16/2020  [facility] and [place] Number:  Herbalist and Address:  [facility] [place], [place] [address] [place], [place] [address]      Provider Number: [id number]  Attending Physician Name and Address:  Mcarthur Rossetti  Relative Name and Phone Number:       Current Level of Care: Hospital Recommended Level of Care: [place] Prior Approval Number:    Date Approved/Denied:   PASRR Number: [id number]  Discharge Plan: SNF    Current Diagnoses: Patient Active Problem List   Diagnosis Date Noted  . Arthritis of left hip 03/16/2020  . Status post total replacement of left hip 03/16/2020  . Hypertension   . GERD (gastroesophageal reflux disease)   . Coronary artery disease   . Chronic diastolic CHF (congestive heart failure) ([place])   . Arthritis   . Pacemaker 09/15/2019  . Sinus node dysfunction (HCC)   . Chest pain 08/20/2019  . Elevated troponin   . Dyspnea 01/03/2016  . Anxiety attack 01/03/2016  . PAF (paroxysmal atrial fibrillation) ([place]) 01/03/2016  . Aphasia   . Other hyperlipidemia   . NSTEMI (non-ST elevated myocardial infarction) ([place]) 04/30/2015  . Hypertension, uncontrolled 07/19/2014  . H/O mitral valve repair   . CAD (coronary artery disease) of artery bypass graft, s/p PCI stent to VG->RCA 04/30/15 07/14/2013  . Obesity (BMI 30-39.9) 07/14/2013  . Hyperlipidemia 07/14/2013  . Hypothyroidism 07/14/2013  . Hypertensive heart disease 07/14/2013  . CAD (coronary artery disease), native coronary artery 07/14/2013  . S/P partial hysterectomy 1977    Orientation RESPIRATION BLADDER Height & Weight     Self,Situation,Time,Place  Normal Continent Weight: 176 lb (79.8 kg) Height:  '4\' 11"'$  (149.9 cm)  BEHAVIORAL SYMPTOMS/MOOD NEUROLOGICAL  BOWEL NUTRITION STATUS      Continent Diet (See discharge summary)  AMBULATORY STATUS COMMUNICATION OF NEEDS Skin   Limited Assist Verbally Surgical wounds,Normal                       Personal Care Assistance Level of Assistance  Feeding,Bathing,Dressing Bathing Assistance: Limited assistance Feeding assistance: Independent Dressing Assistance: Limited assistance     Functional Limitations Info  Sight,Hearing,Speech Sight Info: Adequate Hearing Info: Adequate Speech Info: Adequate    SPECIAL CARE FACTORS FREQUENCY  OT (By licensed OT),PT (By licensed PT)     PT Frequency: 5x a week OT Frequency: 5x a week            Contractures Contractures Info: Not present    Additional Factors Info  Code Status,Allergies Code Status Info: Full Allergies Info: Atorvastatin   Codeine   Crestor (Rosuvastatin)   Pravastatin   Simvastatin   Ticagrelor           Current Medications (03/19/2020):  This is the current hospital active medication list Current Facility-Administered Medications  Medication Dose Route Frequency Provider Last Rate Last Admin  . 0.9 %  sodium chloride infusion   Intravenous Continuous [place] Jude, [facility]   Stopped at 03/19/20 0715  . acetaminophen (TYLENOL) tablet 325-650 mg  325-650 mg Oral Q6H PRN Mcarthur Rossetti, MD   650 mg at 03/18/20 0525  . alum & mag hydroxide-simeth (MAALOX/MYLANTA) 200-200-20 MG/5ML suspension 30 mL  30 mL Oral Q4H PRN Mcarthur Rossetti, MD      .  amiodarone (PACERONE) tablet 200 mg  200 mg Oral Daily Mcarthur Rossetti, MD   200 mg at 03/19/20 1000  . amLODipine (NORVASC) tablet 2.5 mg  2.5 mg Oral Daily Mcarthur Rossetti, MD   2.5 mg at 03/19/20 1000  . apixaban (ELIQUIS) tablet 5 mg  5 mg Oral BID Mcarthur Rossetti, MD   5 mg at 03/19/20 1000  . bisacodyl (DULCOLAX) suppository 10 mg  10 mg Rectal Daily PRN Mcarthur Rossetti, MD   10 mg at 03/19/20 1208  . cholecalciferol (VITAMIN D3) tablet  1,000 Units  1,000 Units Oral Daily Mcarthur Rossetti, MD   1,000 Units at 03/19/20 1000  . clopidogrel (PLAVIX) tablet 75 mg  75 mg Oral Daily Mcarthur Rossetti, MD   75 mg at 03/19/20 1000  . diphenhydrAMINE (BENADRYL) 12.5 MG/5ML elixir 12.5-25 mg  12.5-25 mg Oral Q4H PRN Mcarthur Rossetti, MD      . docusate sodium (COLACE) capsule 100 mg  100 mg Oral BID Mcarthur Rossetti, MD   100 mg at 03/19/20 1000  . ezetimibe (ZETIA) tablet 10 mg  10 mg Oral Daily Mcarthur Rossetti, MD   10 mg at 03/19/20 1000  . levothyroxine (SYNTHROID) tablet 50 mcg  50 mcg Oral QAC breakfast Mcarthur Rossetti, MD   50 mcg at 03/19/20 [phone]  . menthol-cetylpyridinium (CEPACOL) lozenge 3 mg  1 lozenge Oral PRN Mcarthur Rossetti, MD       Or  . phenol (CHLORASEPTIC) mouth spray 1 spray  1 spray Mouth/Throat PRN Mcarthur Rossetti, MD      . methocarbamol (ROBAXIN) tablet 500 mg  500 mg Oral Q6H PRN Mcarthur Rossetti, MD       Or  . methocarbamol (ROBAXIN) 500 mg in dextrose 5 % 50 mL IVPB  500 mg Intravenous Q6H PRN Mcarthur Rossetti, MD      . metoCLOPramide (REGLAN) tablet 5-10 mg  5-10 mg Oral Q8H PRN Mcarthur Rossetti, MD       Or  . metoCLOPramide (REGLAN) injection 5-10 mg  5-10 mg Intravenous Q8H PRN Mcarthur Rossetti, MD      . morphine 2 MG/ML injection 0.5-1 mg  0.5-1 mg Intravenous Q2H PRN Mcarthur Rossetti, MD   1 mg at 03/16/20 2221  . nitroGLYCERIN (NITROSTAT) SL tablet 0.4 mg  0.4 mg Sublingual Q5 min PRN Mcarthur Rossetti, MD      . ondansetron [facility]) tablet 4 mg  4 mg Oral Q6H PRN Mcarthur Rossetti, MD       Or  . ondansetron [facility]) injection 4 mg  4 mg Intravenous Q6H PRN Mcarthur Rossetti, MD      . pantoprazole (PROTONIX) EC tablet 40 mg  40 mg Oral Daily Mcarthur Rossetti, MD   40 mg at 03/19/20 1000  . polyethylene glycol (MIRALAX / GLYCOLAX) packet 17 g  17 g Oral Daily PRN Mcarthur Rossetti, MD   17 g at 03/18/20 1414     Discharge Medications: Please see discharge summary for a list of discharge medications.  Relevant Imaging Results:  Relevant Lab Results:   Additional Information SSN [id number]  Emeterio Reeve, [place]

## 2020-03-19 NOTE — Plan of Care (Signed)
  Problem: Health Behavior/Discharge Planning: Goal: Ability to manage health-related needs will improve Outcome: Progressing   Problem: Clinical Measurements: Goal: Ability to maintain clinical measurements within normal limits will improve Outcome: Progressing   Problem: Activity: Goal: Risk for activity intolerance will decrease Outcome: Progressing   Problem: Nutrition: Goal: Adequate nutrition will be maintained Outcome: Progressing   Problem: Coping: Goal: Level of anxiety will decrease Outcome: Progressing   Problem: Elimination: Goal: Will not experience complications related to bowel motility Outcome: Progressing   Problem: Safety: Goal: Ability to remain free from injury will improve Outcome: Progressing   Problem: Skin Integrity: Goal: Risk for impaired skin integrity will decrease Outcome: Progressing   

## 2020-03-19 NOTE — Plan of Care (Signed)

## 2020-03-19 NOTE — Progress Notes (Addendum)
Physical Therapy Treatment Patient Details Name: Heather [name] MRN: [id number] DOB: 09-11-1930 Today's Date: 03/19/2020    History of Present Illness Pt is 87 yo female s/p L anterior THA on 03/16/20.  She has PMH including arthritis L hip, HTN, GERD, CAD, pacemaker.    PT Comments    Pt seated in recliner this session.  Pt continues to require assistance to mobilize but able to progress L step forward without physical assistance.  Pt plans to d/c to snf as she lives home alone.    Follow Up Recommendations  Follow surgeon's recommendation for DC plan and follow-up therapies;Supervision/Assistance - 24 hour     Equipment Recommendations  Rolling walker with 5" wheels;Other (comment) (youth height)    Recommendations for Other Services       Precautions / Restrictions Precautions Precautions: Fall Restrictions Weight Bearing Restrictions: Yes LLE Weight Bearing: Weight bearing as tolerated    Mobility  Bed Mobility               General bed mobility comments: Pt seated in recliner on arrival this session.    Transfers Overall transfer level: Needs assistance Equipment used: Rolling walker (2 wheeled) Transfers: Sit to/from Stand Sit to Stand: Min guard         General transfer comment: Cues for hand placement and safety.  Ambulation/Gait Ambulation/Gait assistance: Min assist Gait Distance (Feet): 40 Feet Assistive device: Rolling walker (2 wheeled) Gait Pattern/deviations: Step-to pattern;Antalgic Gait velocity: decreased   General Gait Details: Pt with improved foot clearance on LLE during swing phase.  Pt required several standing rest breaks due to burning pain in thigh.   Stairs             Wheelchair Mobility    Modified Rankin (Stroke Patients Only)       Balance Overall balance assessment: Needs assistance Sitting-balance support: No upper extremity supported Sitting balance-Leahy Scale: Fair       Standing balance-Leahy Scale:  Poor                              Cognition Arousal/Alertness: Awake/alert Behavior During Therapy: WFL for tasks assessed/performed Overall Cognitive Status: Within Functional Limits for tasks assessed                                 General Comments: motivated      Exercises General Exercises - Lower Extremity Ankle Circles/Pumps: AROM;Both;Supine;20 reps Quad Sets: AROM;Left;10 reps;Supine Long Arc Quad: AROM;Left;10 reps;Seated Heel Slides: Left;10 reps;Supine;AAROM Hip ABduction/ADduction: AAROM;Left;10 reps;Supine Straight Leg Raises: 10 reps    General Comments        Pertinent Vitals/Pain Pain Assessment: 0-10 Pain Score: 9  Pain Location: L hip Pain Descriptors / Indicators: Discomfort;Sore;Throbbing Pain Intervention(s): Monitored during session;Repositioned    Home Living                      Prior Function            PT Goals (current goals can now be found in the care plan section) Acute Rehab PT Goals Patient Stated Goal: decrease hip pain, walk better Potential to Achieve Goals: Good Progress towards PT goals: Progressing toward goals    Frequency    7X/week      PT Plan Current plan remains appropriate    Co-evaluation  AM-PAC PT "6 Clicks" Mobility   Outcome Measure  Help needed turning from your back to your side while in a flat bed without using bedrails?: A Little Help needed moving from lying on your back to sitting on the side of a flat bed without using bedrails?: A Little Help needed moving to and from a bed to a chair (including a wheelchair)?: A Little Help needed standing up from a chair using your arms (e.g., wheelchair or bedside chair)?: A Little Help needed to walk in hospital room?: A Little Help needed climbing 3-5 steps with a railing? : A Lot 6 Click Score: 17    End of Session Equipment Utilized During Treatment: Gait belt Activity Tolerance: Patient tolerated  treatment well Patient left: in chair;with call bell/phone within reach;with nursing/sitter in room Nurse Communication: Mobility status PT Visit Diagnosis: Other abnormalities of gait and mobility (R26.89);Muscle weakness (generalized) (M62.81)     Time: [id number] PT Time Calculation (min) (ACUTE ONLY): 25 min  Charges:  $Gait Training: 8-22 mins $Therapeutic Exercise: 8-22 mins                     Erasmo Leventhal , PTA Acute Rehabilitation Services Pager [phone] Office [phone]     Ambert Virrueta Eli Hose 03/19/2020, 2:25 PM

## 2020-03-19 NOTE — TOC Initial Note (Signed)
Transition of Care [facility] - Initial/Assessment Note    Patient Details  Name: Heather [name] MRN: [id number] Date of Birth: 1930/01/27  Transition of [facility]) CM/SW Contact:    Emeterio Reeve, [place] Phone Number: 03/19/2020, 12:32 PM  Clinical Narrative:                  CSW spoke to pt at bedside. Pt reports PTA she was living at home by herself. Pt reports she was using a rollator at home but had lots of difficulty getting around due to pain. Pt reports she completes her ADLS on her own but her daughter does assist when needed. Pt is not covid vaccinated.   CSW reviewed PT/OT reccs with pt. Pt reports she has not been to SNF in the past but is open to going only to [facility]. Pt later did give csw permission to fax out to other facilities as a back up. CSW gave pt medicare.gov rating list to review with daughter.   TOC to follow.   Expected Discharge Plan: Skilled Nursing Facility Barriers to Discharge: Continued Medical Work up   Patient Goals and CMS Choice Patient states their goals for this hospitalization and ongoing recovery are:: Get stronger return home CMS Medicare.gov Compare Post Acute Care list provided to:: Patient Choice offered to / list presented to : Patient  Expected Discharge Plan and Services Expected Discharge Plan: [place]       Living arrangements for the past 2 months: Single Family Home                                      Prior Living Arrangements/Services Living arrangements for the past 2 months: Single Family Home Lives with:: Self Patient language and need for interpreter reviewed:: Yes Do you feel safe going back to the place where you live?: Yes      Need for Family Participation in Patient Care: Yes (Comment) Care giver support system in place?: Yes (comment) Current home services: DME Criminal Activity/Legal Involvement Pertinent to Current Situation/Hospitalization: No - Comment as needed  Activities of  Daily Living Home Assistive Devices/Equipment: Walker (specify type),Eyeglasses ADL Screening (condition at time of admission) Patient's cognitive ability adequate to safely complete daily activities?: Yes Is the patient deaf or have difficulty hearing?: No Does the patient have difficulty seeing, even when wearing glasses/contacts?: No Does the patient have difficulty concentrating, remembering, or making decisions?: No Patient able to express need for assistance with ADLs?: Yes Does the patient have difficulty dressing or bathing?: No Independently performs ADLs?: Yes (appropriate for developmental age) Does the patient have difficulty walking or climbing stairs?: Yes Weakness of Legs: Both Weakness of Arms/Hands: None  Permission Sought/Granted Permission sought to share information with : Case Manager Permission granted to share information with : Yes, Verbal Permission Granted     Permission granted to share info w AGENCY: SNF  Permission granted to share info w Relationship: daughter     Emotional Assessment Appearance:: Appears stated age Attitude/Demeanor/Rapport: Engaged Affect (typically observed): Accepting,Appropriate Orientation: : Oriented to Place,Oriented to Self,Oriented to  Time,Oriented to Situation Alcohol / Substance Use: Not Applicable Psych Involvement: No (comment)  Admission diagnosis:  Status post total replacement of left hip [id number] Patient Active Problem List   Diagnosis Date Noted  . Arthritis of left hip 03/16/2020  . Status post total replacement of left hip 03/16/2020  .  Hypertension   . GERD (gastroesophageal reflux disease)   . Coronary artery disease   . Chronic diastolic CHF (congestive heart failure) ([place])   . Arthritis   . Pacemaker 09/15/2019  . Sinus node dysfunction (HCC)   . Chest pain 08/20/2019  . Elevated troponin   . Dyspnea 01/03/2016  . Anxiety attack 01/03/2016  . PAF (paroxysmal atrial fibrillation) ([place]) 01/03/2016   . Aphasia   . Other hyperlipidemia   . NSTEMI (non-ST elevated myocardial infarction) ([place]) 04/30/2015  . Hypertension, uncontrolled 07/19/2014  . H/O mitral valve repair   . CAD (coronary artery disease) of artery bypass graft, s/p PCI stent to VG->RCA 04/30/15 07/14/2013  . Obesity (BMI 30-39.9) 07/14/2013  . Hyperlipidemia 07/14/2013  . Hypothyroidism 07/14/2013  . Hypertensive heart disease 07/14/2013  . CAD (coronary artery disease), native coronary artery 07/14/2013  . S/P partial hysterectomy 1977   PCP:  Leonard Downing, MD Pharmacy:   [facility] [address], [place] [address] Phone: [phone] Fax: [place], [place] - [address] [place] [address] Phone: [phone] Fax: [phone]     Social Determinants of Health (SDOH) Interventions    Readmission Risk Interventions No flowsheet data found.  Emeterio Reeve, Latanya Presser, [place] Social Worker [phone]

## 2020-03-19 NOTE — Progress Notes (Signed)
Patient ID: Heather [name], female   DOB: Dec 23, 1930, 87 y.o.   MRN: [id number] Appreciate the Transitional Care Team in efforts for disposition on this patient.  The plan is to discharge to short-term skilled nursing tomorrow (Saturday).

## 2020-03-19 NOTE — Discharge Summary (Signed)
Patient ID: Heather [name] MRN: [id number] DOB/AGE: June 17, 1930 87 y.o.  Admit date: 03/16/2020 Discharge date: 03/19/2020  Admission Diagnoses:  Principal Problem:   Arthritis of left hip Active Problems:   Status post total replacement of left hip   Discharge Diagnoses:  Same  Past Medical History:  Diagnosis Date  . Anxiety attack 01/03/2016  . Aphasia 01/03/2016  . Arthritis   . CAD (coronary artery disease) of artery bypass graft, s/p PCI stent to VG->RCA 04/30/15 07/14/2013   Catheterization August/2003 showing severe three-vessel disease and moderate mitral regurgitation 08/28/2001 CABG with internal mammary graft to LAD, vein graft to circumflex, and vein graft to posterolateral branch and mitral valve repair by Dr. 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 Patent internal ma  . CAD (coronary artery disease), native coronary artery 07/14/2013   Catheterization August/2003 showing severe three-vessel disease and moderate mitral regurgitation 08/28/2001 CABG with internal mammary graft to LAD, vein graft to circumflex, and vein graft to posterolateral branch and mitral valve repair by Dr. 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 Patent internal mammary graft with competitive flow in the distal vessel, occluded right coronary artery, occluded circumflex, patent saphenous vein graft to circumflex with mild/moderate disease proximally, minimal LV damage   . Chest pain 08/20/2019  . Chronic diastolic CHF (congestive heart failure) (HCC)    a. EF 60% by LV gram 01/2009.  . Coronary artery disease    a. s/p MI in 1980;  b. 2003 CABG x 3 (LIMA->LAD, VG->OM, VG->RPL);  c. 01/2009 Cath: 3/3 patent grafts, native 3VD, EF 60%.  . Diverticulitis   . Dyspnea 01/03/2016  . Elevated troponin   . GERD (gastroesophageal  reflux disease)   . H/O mitral valve repair    a. 2003  . History of kidney stones   . Hyperlipidemia 07/14/2013   Intolerance to multiple statins including Crestor, pravastatin, and Lipitor   . Hypertension   . Hypertension, uncontrolled 07/19/2014  . Hypertensive heart disease 07/14/2013  . Hypothyroidism   . NSTEMI (non-ST elevated myocardial infarction) ([place])   . Other hyperlipidemia   . PAF (paroxysmal atrial fibrillation) ([place])   . Pneumonia   . Presence of permanent cardiac pacemaker 08/22/2019  . S/P partial hysterectomy 1977  . Sinus node dysfunction (HCC)     Surgeries: Procedure(s): LEFT TOTAL HIP ARTHROPLASTY ANTERIOR APPROACH on 03/16/2020   Consultants:   Discharged Condition: Improved  Hospital Course: Heather [name] is an 87 y.o. female who was admitted 03/16/2020 for operative treatment ofArthritis of left hip. Patient has severe unremitting pain that affects sleep, daily activities, and work/hobbies. After pre-op clearance the patient was taken to the operating room on 03/16/2020 and underwent  Procedure(s): LEFT TOTAL HIP ARTHROPLASTY ANTERIOR APPROACH.    Patient was given perioperative antibiotics:  Anti-infectives (From admission, onward)   Start     Dose/Rate Route Frequency Ordered Stop   03/16/20 1800  ceFAZolin (ANCEF) IVPB 1 g/50 mL premix        1 g 100 mL/hr over 30 Minutes Intravenous Every 6 hours 03/16/20 1552 03/17/20 0126   03/16/20 1015  ceFAZolin (ANCEF) IVPB 2g/100 mL premix        2 g 200 mL/hr over 30 Minutes Intravenous On call to O.R. 03/16/20 1000 03/16/20 1232       Patient  was given sequential compression devices, early ambulation, and chemoprophylaxis to prevent DVT.  Patient benefited maximally from hospital stay and there were no complications.    Recent vital signs:  Patient Vitals for the past 24 hrs:  BP Temp Temp src Pulse Resp SpO2  03/19/20 0959 (!) 128/52 98.4 F (36.9 C) Oral 74 17 98 %  03/19/20 0546 - - - - - 97 %      Recent laboratory studies:  Recent Labs    03/17/20 0448  WBC 9.7  HGB 10.0*  HCT 29.6*  PLT 183  NA 135  K 4.5  CL 101  CO2 25  BUN 12  CREATININE 0.79  GLUCOSE 138*  CALCIUM 8.9     Discharge Medications:   Allergies as of 03/19/2020      Reactions   Atorvastatin    Muscle ache   Codeine Nausea And Vomiting   Crestor [rosuvastatin]    Muscle ache   Pravastatin    Muscle ache   Simvastatin    Muscle cramps   Ticagrelor    Dyspnea      Medication List    TAKE these medications   acetaminophen 325 MG tablet Commonly known as: TYLENOL Take 1-2 tablets (325-650 mg total) by mouth every 6 (six) hours as needed for mild pain (pain score 1-3 or temp > 100.5). What changed:   medication strength  how much to take  reasons to take this   amiodarone 200 MG tablet Commonly known as: PACERONE Take 2 tablets by mouth for 7 days.  Then decrease to one tablet by mouth daily. What changed:   how much to take  how to take this  when to take this  additional instructions   amLODipine 2.5 MG tablet Commonly known as: NORVASC TAKE 1 TABLET BY MOUTH ONCE DAILY NEEDS  APPOINTMENT  WITH  CARDIOLOGIST What changed: See the new instructions.   apixaban 5 MG Tabs tablet Commonly known as: Eliquis Take 1 tablet (5 mg total) by mouth 2 (two) times daily.   cholecalciferol 25 MCG (1000 UNIT) tablet Commonly known as: VITAMIN D3 Take 1,000 Units by mouth daily.   clopidogrel 75 MG tablet Commonly known as: PLAVIX Take 1 tablet (75 mg total) by mouth daily.   ezetimibe 10 MG tablet Commonly known as: ZETIA Take 1 tablet (10 mg total) by mouth daily.   famotidine 10 MG tablet Commonly known as: PEPCID Take 10 mg by mouth daily as needed for heartburn or indigestion.   levothyroxine 50 MCG tablet Commonly known as: SYNTHROID Take 50 mcg by mouth daily before breakfast.   nitroGLYCERIN 0.4 MG SL tablet Commonly known as: NITROSTAT Place 0.4 mg under the  tongue every 5 (five) minutes as needed for chest pain.            Durable Medical Equipment  (From admission, onward)         Start     Ordered   03/16/20 1553  DME 3 n 1  Once        03/16/20 1552   03/16/20 1553  DME Walker rolling  Once       Question Answer Comment  Walker: With 5 Inch Wheels   Patient needs a walker to treat with the following condition Status post total replacement of left hip      03/16/20 1552          Diagnostic Studies: DG Pelvis Portable  Result Date: 03/16/2020 CLINICAL DATA:  Left total  hip replacement, postoperative EXAM: PORTABLE PELVIS 1-2 VIEWS COMPARISON:  03/16/2020 and 11/24/2019 FINDINGS: Left total hip prosthesis identified with expected positioning and orientation. No periprosthetic fracture or early complicating feature. Expected gas in the adjacent soft tissues. Moderate axial loss of articular space in the right hip. Spurring along the SI joints. IMPRESSION: 1. Left total hip prosthesis in place without complicating feature. Electronically Signed   By: Van Clines M.D.   On: 03/16/2020 15:00   DG C-Arm 1-60 Min  Result Date: 03/16/2020 CLINICAL DATA:  Surgery, elective. Additional history provided: Total arthroplasty of left hip with anterior approach. Provided fluoroscopy time 21 seconds (2.403 mGy). EXAM: OPERATIVE 5 HIP (WITH PELVIS IF PERFORMED) left VIEWS TECHNIQUE: Fluoroscopic spot image(s) were submitted for interpretation post-operatively. COMPARISON:  Radiographs of the left hip and pelvis 11/24/2019. FINDINGS: Five intraoperative fluoroscopic images of the left hip are submitted. The images demonstrate interval left total hip arthroplasty. On the final submitted images, the femoral and acetabular components appear well seated. No unexpected finding. IMPRESSION: Five intraoperative fluoroscopic images from left total hip arthroplasty, as described. Electronically Signed   By: Kellie Simmering DO   On: 03/16/2020 14:01   CUP  PACEART REMOTE DEVICE CHECK  Result Date: 02/20/2020 Scheduled remote reviewed. Normal device function.  Next remote 91 days. HB  DG HIP OPERATIVE UNILAT W OR W/O PELVIS LEFT  Result Date: 03/16/2020 CLINICAL DATA:  Surgery, elective. Additional history provided: Total arthroplasty of left hip with anterior approach. Provided fluoroscopy time 21 seconds (2.403 mGy). EXAM: OPERATIVE 5 HIP (WITH PELVIS IF PERFORMED) left VIEWS TECHNIQUE: Fluoroscopic spot image(s) were submitted for interpretation post-operatively. COMPARISON:  Radiographs of the left hip and pelvis 11/24/2019. FINDINGS: Five intraoperative fluoroscopic images of the left hip are submitted. The images demonstrate interval left total hip arthroplasty. On the final submitted images, the femoral and acetabular components appear well seated. No unexpected finding. IMPRESSION: Five intraoperative fluoroscopic images from left total hip arthroplasty, as described. Electronically Signed   By: Kellie Simmering DO   On: 03/16/2020 14:01    Disposition: To short-term skilled nursing facility     Follow-up Information    Mcarthur Rossetti, MD Follow up in 2 week(s).   Specialty: Orthopedic Surgery Contact information: [address] [place] [address] [phone]                Signed: Mcarthur Rossetti 03/19/2020, 3:32 PM

## 2020-03-19 NOTE — Discharge Instructions (Signed)

## 2020-03-19 NOTE — TOC Progression Note (Addendum)
Transition of Care [facility]) - Progression Note    Patient Details  Name: Heather [name] MRN: [id number] Date of Birth: [birth year]  Transition of Care [facility]) CM/SW [place], [place] Phone Number: 03/19/2020, 2:25 PM  Clinical Narrative:    [facility] has accepted pt for SNF. [facility] can accept pt on Saturday, March 5th if medically stable for discharge.  Insurance has been started reference # [id number].  TOC will continue to assist with disposition planning.   Expected Discharge Plan: [place] Barriers to Discharge: Continued Medical Work up  Expected Discharge Plan and Services Expected Discharge Plan: [place] arrangements for the past 2 months: Single Family Home                                       Social Determinants of Health (SDOH) Interventions    Readmission Risk Interventions No flowsheet data found.

## 2020-03-20 NOTE — Plan of Care (Signed)

## 2020-03-20 NOTE — TOC Transition Note (Signed)
Transition of Care [facility]) - CM/SW Discharge Note   Patient Details  Name: LAMAIYA BEVINS MRN: [id number] Date of Birth: 1930/08/13  Transition of Care [facility]) CM/SW Contact:  [address], [place] Phone Number: 03/20/2020, 9:56 AM   Clinical Narrative:    Patient will DC to: [facility] Anticipated DC date:  03/20/20 Family notified: Liliane Shi Transport by: Corey Harold   Per MD patient ready for DC to [facility]. RN to call report prior to discharge [id number], room 107. RN, patient, patient's family, and facility notified of DC. Discharge Summary and FL2 sent to facility. DC packet on chart. Ambulance transport requested for patient.   [place] approved, approval number [id number], 03/20/20-03/23/20.  CSW will sign off for now as social work intervention is no longer needed. Please consult [place] again if new needs arise.       Barriers to Discharge: Continued Medical Work up   Patient Goals and CMS Choice Patient states their goals for this hospitalization and ongoing recovery are:: Get stronger return home CMS Medicare.gov Compare Post Acute Care list provided to:: Patient Choice offered to / list presented to : Patient  Discharge Placement                       Discharge Plan and Services                                     Social Determinants of Health (SDOH) Interventions     Readmission Risk Interventions No flowsheet data found.

## 2020-03-20 NOTE — Discharge Summary (Signed)
Patient ID: Heather [name] MRN: [id number] DOB/AGE: 09/23/1930 87 y.o.  Admit date: 03/16/2020 Discharge date: 03/20/2020  Admission Diagnoses:  Principal Problem:   Arthritis of left hip Active Problems:   Status post total replacement of left hip   Discharge Diagnoses:  Same  Past Medical History:  Diagnosis Date  . Anxiety attack 01/03/2016  . Aphasia 01/03/2016  . Arthritis   . CAD (coronary artery disease) of artery bypass graft, s/p PCI stent to VG->RCA 04/30/15 07/14/2013   Catheterization August/2003 showing severe three-vessel disease and moderate mitral regurgitation 08/28/2001 CABG with internal mammary graft to LAD, vein graft to circumflex, and vein graft to posterolateral branch and mitral valve repair by Dr. 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 Patent internal ma  . CAD (coronary artery disease), native coronary artery 07/14/2013   Catheterization August/2003 showing severe three-vessel disease and moderate mitral regurgitation 08/28/2001 CABG with internal mammary graft to LAD, vein graft to circumflex, and vein graft to posterolateral branch and mitral valve repair by Dr. 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 Patent internal mammary graft with competitive flow in the distal vessel, occluded right coronary artery, occluded circumflex, patent saphenous vein graft to circumflex with mild/moderate disease proximally, minimal LV damage   . Chest pain 08/20/2019  . Chronic diastolic CHF (congestive heart failure) (HCC)    a. EF 60% by LV gram 01/2009.  . Coronary artery disease    a. s/p MI in 1980;  b. 2003 CABG x 3 (LIMA->LAD, VG->OM, VG->RPL);  c. 01/2009 Cath: 3/3 patent grafts, native 3VD, EF 60%.  . Diverticulitis   . Dyspnea 01/03/2016  . Elevated troponin   . GERD (gastroesophageal  reflux disease)   . H/O mitral valve repair    a. 2003  . History of kidney stones   . Hyperlipidemia 07/14/2013   Intolerance to multiple statins including Crestor, pravastatin, and Lipitor   . Hypertension   . Hypertension, uncontrolled 07/19/2014  . Hypertensive heart disease 07/14/2013  . Hypothyroidism   . NSTEMI (non-ST elevated myocardial infarction) ([place])   . Other hyperlipidemia   . PAF (paroxysmal atrial fibrillation) ([place])   . Pneumonia   . Presence of permanent cardiac pacemaker 08/22/2019  . S/P partial hysterectomy 1977  . Sinus node dysfunction (HCC)     Surgeries: Procedure(s): LEFT TOTAL HIP ARTHROPLASTY ANTERIOR APPROACH on 03/16/2020   Consultants:   Discharged Condition: Improved  Hospital Course: Heather [name] is an 87 y.o. female who was admitted 03/16/2020 for operative treatment ofArthritis of left hip. Patient has severe unremitting pain that affects sleep, daily activities, and work/hobbies. After pre-op clearance the patient was taken to the operating room on 03/16/2020 and underwent  Procedure(s): LEFT TOTAL HIP ARTHROPLASTY ANTERIOR APPROACH.    Patient was given perioperative antibiotics:  Anti-infectives (From admission, onward)   Start     Dose/Rate Route Frequency Ordered Stop   03/16/20 1800  ceFAZolin (ANCEF) IVPB 1 g/50 mL premix        1 g 100 mL/hr over 30 Minutes Intravenous Every 6 hours 03/16/20 1552 03/17/20 0126   03/16/20 1015  ceFAZolin (ANCEF) IVPB 2g/100 mL premix        2 g 200 mL/hr over 30 Minutes Intravenous On call to O.R. 03/16/20 1000 03/16/20 1232       Patient  was given sequential compression devices, early ambulation, and chemoprophylaxis to prevent DVT.  Patient benefited maximally from hospital stay and there were no complications.    Recent vital signs:  Patient Vitals for the past 24 hrs:  BP Temp Temp src Pulse Resp SpO2  03/20/20 0759 (!) 139/55 98.3 F (36.8 C) Oral 67 19 97 %  03/20/20 0401 (!) 137/59 98.6 F  (37 C) - 75 16 98 %  03/19/20 1934 (!) 134/53 98.2 F (36.8 C) Oral 73 16 97 %  03/19/20 1551 (!) 141/58 97.8 F (36.6 C) Oral 65 17 100 %  03/19/20 0959 (!) 128/52 98.4 F (36.9 C) Oral 74 17 98 %     Recent laboratory studies: No results for input(s): WBC, HGB, HCT, PLT, NA, K, CL, CO2, BUN, CREATININE, GLUCOSE, INR, CALCIUM in the last 72 hours.  Invalid input(s): PT, 2   Discharge Medications:   Allergies as of 03/20/2020      Reactions   Atorvastatin    Muscle ache   Codeine Nausea And Vomiting   Crestor [rosuvastatin]    Muscle ache   Pravastatin    Muscle ache   Simvastatin    Muscle cramps   Ticagrelor    Dyspnea      Medication List    TAKE these medications   acetaminophen 325 MG tablet Commonly known as: TYLENOL Take 1-2 tablets (325-650 mg total) by mouth every 6 (six) hours as needed for mild pain (pain score 1-3 or temp > 100.5). What changed:   medication strength  how much to take  reasons to take this   amiodarone 200 MG tablet Commonly known as: PACERONE Take 2 tablets by mouth for 7 days.  Then decrease to one tablet by mouth daily. What changed:   how much to take  how to take this  when to take this  additional instructions   amLODipine 2.5 MG tablet Commonly known as: NORVASC TAKE 1 TABLET BY MOUTH ONCE DAILY NEEDS  APPOINTMENT  WITH  CARDIOLOGIST What changed: See the new instructions.   apixaban 5 MG Tabs tablet Commonly known as: Eliquis Take 1 tablet (5 mg total) by mouth 2 (two) times daily.   cholecalciferol 25 MCG (1000 UNIT) tablet Commonly known as: VITAMIN D3 Take 1,000 Units by mouth daily.   clopidogrel 75 MG tablet Commonly known as: PLAVIX Take 1 tablet (75 mg total) by mouth daily.   ezetimibe 10 MG tablet Commonly known as: ZETIA Take 1 tablet (10 mg total) by mouth daily.   famotidine 10 MG tablet Commonly known as: PEPCID Take 10 mg by mouth daily as needed for heartburn or indigestion.    levothyroxine 50 MCG tablet Commonly known as: SYNTHROID Take 50 mcg by mouth daily before breakfast.   nitroGLYCERIN 0.4 MG SL tablet Commonly known as: NITROSTAT Place 0.4 mg under the tongue every 5 (five) minutes as needed for chest pain.            Durable Medical Equipment  (From admission, onward)         Start     Ordered   03/16/20 1553  DME 3 n 1  Once        03/16/20 1552   03/16/20 1553  DME Walker rolling  Once       Question Answer Comment  Walker: With 5 Inch Wheels   Patient needs a walker to treat with the following condition Status post total replacement of left hip  03/16/20 1552          Diagnostic Studies: DG Pelvis Portable  Result Date: 03/16/2020 CLINICAL DATA:  Left total hip replacement, postoperative EXAM: PORTABLE PELVIS 1-2 VIEWS COMPARISON:  03/16/2020 and 11/24/2019 FINDINGS: Left total hip prosthesis identified with expected positioning and orientation. No periprosthetic fracture or early complicating feature. Expected gas in the adjacent soft tissues. Moderate axial loss of articular space in the right hip. Spurring along the SI joints. IMPRESSION: 1. Left total hip prosthesis in place without complicating feature. Electronically Signed   By: Van Clines M.D.   On: 03/16/2020 15:00   DG C-Arm 1-60 Min  Result Date: 03/16/2020 CLINICAL DATA:  Surgery, elective. Additional history provided: Total arthroplasty of left hip with anterior approach. Provided fluoroscopy time 21 seconds (2.403 mGy). EXAM: OPERATIVE 5 HIP (WITH PELVIS IF PERFORMED) left VIEWS TECHNIQUE: Fluoroscopic spot image(s) were submitted for interpretation post-operatively. COMPARISON:  Radiographs of the left hip and pelvis 11/24/2019. FINDINGS: Five intraoperative fluoroscopic images of the left hip are submitted. The images demonstrate interval left total hip arthroplasty. On the final submitted images, the femoral and acetabular components appear well seated. No  unexpected finding. IMPRESSION: Five intraoperative fluoroscopic images from left total hip arthroplasty, as described. Electronically Signed   By: Kellie Simmering DO   On: 03/16/2020 14:01   CUP PACEART REMOTE DEVICE CHECK  Result Date: 02/20/2020 Scheduled remote reviewed. Normal device function.  Next remote 91 days. HB  DG HIP OPERATIVE UNILAT W OR W/O PELVIS LEFT  Result Date: 03/16/2020 CLINICAL DATA:  Surgery, elective. Additional history provided: Total arthroplasty of left hip with anterior approach. Provided fluoroscopy time 21 seconds (2.403 mGy). EXAM: OPERATIVE 5 HIP (WITH PELVIS IF PERFORMED) left VIEWS TECHNIQUE: Fluoroscopic spot image(s) were submitted for interpretation post-operatively. COMPARISON:  Radiographs of the left hip and pelvis 11/24/2019. FINDINGS: Five intraoperative fluoroscopic images of the left hip are submitted. The images demonstrate interval left total hip arthroplasty. On the final submitted images, the femoral and acetabular components appear well seated. No unexpected finding. IMPRESSION: Five intraoperative fluoroscopic images from left total hip arthroplasty, as described. Electronically Signed   By: Kellie Simmering DO   On: 03/16/2020 14:01    Disposition: Discharge disposition: 03-Skilled [place]    Mcarthur Rossetti, MD Follow up in 2 week(s).   Specialty: Orthopedic Surgery Contact information: [address] [place] [address] [phone]                Signed: Mcarthur Rossetti 03/20/2020, 8:14 AM

## 2020-03-20 NOTE — Progress Notes (Signed)
  Subjective: Heather [name] is a 87 y.o. female s/p left THA.  They are POD 4.  Pt's pain is controlled but moderate today.  She was doing very well yesterday but feels she has taken 1 step back today..  Pt has ambulated with some difficulty.  Denies any other complaints today.   Objective: Vital signs in last 24 hours: Temp:  [97.8 F (36.6 C)-98.6 F (37 C)] 98.3 F (36.8 C) (03/05 0759) Pulse Rate:  [65-75] 67 (03/05 0759) Resp:  [16-19] 19 (03/05 0759) BP: (134-141)/(53-59) 139/55 (03/05 0759) SpO2:  [97 %-100 %] 97 % (03/05 0759)  Intake/Output from previous day: 03/04 0701 - 03/05 0700 In: 2200.6 [I.V.:2200.6] Out: -  Intake/Output this shift: Total I/O In: 240 [P.O.:240] Out: 800 [Urine:800]  Exam:  No gross blood or drainage overlying the dressing 2+ DP pulse Sensation intact distally in the left foot Able to dorsiflex and plantarflex the left foot   Labs: No results for input(s): HGB in the last 72 hours. No results for input(s): WBC, RBC, HCT, PLT in the last 72 hours. No results for input(s): NA, K, CL, CO2, BUN, CREATININE, GLUCOSE, CALCIUM in the last 72 hours. No results for input(s): LABPT, INR in the last 72 hours.  Assessment/Plan: Pt is POD 4 s/p left THA.    -Plan to discharge to SNF today   -WBAT with a walker  -She has appointment with Raquel James for follow-up on 03/30/2020 at 9 AM.  She was informed of this follow-up appointment.  Answered patient's questions to her satisfaction.     Lyndsee Casa L Paarth Cropper 03/20/2020, 11:48 AM

## 2020-03-20 NOTE — Progress Notes (Signed)
Called facility and gave report to nurse Hinton Dyer, all questions answered. Pt not in distress, discharged to facility with belongings via PTAR.

## 2020-03-20 NOTE — NC FL2 (Signed)
[place] LEVEL OF CARE SCREENING TOOL     IDENTIFICATION  Patient Name: Heather [name] Birthdate: 1930/11/09 Sex: female Admission Date (Current Location): 03/16/2020  [facility] and [place] Number:  Herbalist and Address:  [facility] [place], [place] [address] [place], [place] [address]      Provider Number: [id number]  Attending Physician Name and Address:  Mcarthur Rossetti  Relative Name and Phone Number:       Current Level of Care: Hospital Recommended Level of Care: [place] Prior Approval Number:    Date Approved/Denied:   PASRR Number: [id number]  Discharge Plan: SNF    Current Diagnoses: Patient Active Problem List   Diagnosis Date Noted  . Arthritis of left hip 03/16/2020  . Status post total replacement of left hip 03/16/2020  . Hypertension   . GERD (gastroesophageal reflux disease)   . Coronary artery disease   . Chronic diastolic CHF (congestive heart failure) ([place])   . Arthritis   . Pacemaker 09/15/2019  . Sinus node dysfunction (HCC)   . Chest pain 08/20/2019  . Elevated troponin   . Dyspnea 01/03/2016  . Anxiety attack 01/03/2016  . PAF (paroxysmal atrial fibrillation) ([place]) 01/03/2016  . Aphasia   . Other hyperlipidemia   . NSTEMI (non-ST elevated myocardial infarction) ([place]) 04/30/2015  . Hypertension, uncontrolled 07/19/2014  . H/O mitral valve repair   . CAD (coronary artery disease) of artery bypass graft, s/p PCI stent to VG->RCA 04/30/15 07/14/2013  . Obesity (BMI 30-39.9) 07/14/2013  . Hyperlipidemia 07/14/2013  . Hypothyroidism 07/14/2013  . Hypertensive heart disease 07/14/2013  . CAD (coronary artery disease), native coronary artery 07/14/2013  . S/P partial hysterectomy 1977    Orientation RESPIRATION BLADDER Height & Weight     Self,Situation,Time,Place  Normal Continent Weight: 176 lb (79.8 kg) Height:  '4\' 11"'$  (149.9 cm)  BEHAVIORAL SYMPTOMS/MOOD NEUROLOGICAL  BOWEL NUTRITION STATUS      Continent Diet (See discharge summary)  AMBULATORY STATUS COMMUNICATION OF NEEDS Skin   Limited Assist Verbally Surgical wounds,Normal                       Personal Care Assistance Level of Assistance  Feeding,Bathing,Dressing Bathing Assistance: Limited assistance Feeding assistance: Independent Dressing Assistance: Limited assistance     Functional Limitations Info  Sight,Hearing,Speech Sight Info: Adequate Hearing Info: Adequate Speech Info: Adequate    SPECIAL CARE FACTORS FREQUENCY  OT (By licensed OT),PT (By licensed PT)     PT Frequency: 5x a week OT Frequency: 5x a week            Contractures Contractures Info: Not present    Additional Factors Info  Code Status,Allergies Code Status Info: Full Allergies Info: Atorvastatin   Codeine   Crestor (Rosuvastatin)   Pravastatin   Simvastatin   Ticagrelor           Current Medications (03/20/2020):  This is the current hospital active medication list Current Facility-Administered Medications  Medication Dose Route Frequency Provider Last Rate Last Admin  . 0.9 %  sodium chloride infusion   Intravenous Continuous Donnamae Jude, [facility]   Stopped at 03/19/20 0715  . acetaminophen (TYLENOL) tablet 325-650 mg  325-650 mg Oral Q6H PRN Mcarthur Rossetti, MD   650 mg at 03/20/20 [id number]  . alum & mag hydroxide-simeth (MAALOX/MYLANTA) 200-200-20 MG/5ML suspension 30 mL  30 mL Oral Q4H PRN Mcarthur Rossetti, MD      .  amiodarone (PACERONE) tablet 200 mg  200 mg Oral Daily Mcarthur Rossetti, MD   200 mg at 03/20/20 [id number]  . amLODipine (NORVASC) tablet 2.5 mg  2.5 mg Oral Daily Mcarthur Rossetti, MD   2.5 mg at 03/20/20 [id number]  . apixaban (ELIQUIS) tablet 5 mg  5 mg Oral BID Mcarthur Rossetti, MD   5 mg at 03/20/20 [id number]  . bisacodyl (DULCOLAX) suppository 10 mg  10 mg Rectal Daily PRN Mcarthur Rossetti, MD   10 mg at 03/19/20 1208  . cholecalciferol (VITAMIN D3) tablet  1,000 Units  1,000 Units Oral Daily Mcarthur Rossetti, MD   1,000 Units at 03/20/20 [phone]  . clopidogrel (PLAVIX) tablet 75 mg  75 mg Oral Daily Mcarthur Rossetti, MD   75 mg at 03/20/20 [id number]  . diphenhydrAMINE (BENADRYL) 12.5 MG/5ML elixir 12.5-25 mg  12.5-25 mg Oral Q4H PRN Mcarthur Rossetti, MD      . docusate sodium (COLACE) capsule 100 mg  100 mg Oral BID Mcarthur Rossetti, MD   100 mg at 03/20/20 [id number]  . ezetimibe (ZETIA) tablet 10 mg  10 mg Oral Daily Mcarthur Rossetti, MD   10 mg at 03/20/20 [id number]  . levothyroxine (SYNTHROID) tablet 50 mcg  50 mcg Oral QAC breakfast Mcarthur Rossetti, MD   50 mcg at 03/20/20 0601  . menthol-cetylpyridinium (CEPACOL) lozenge 3 mg  1 lozenge Oral PRN Mcarthur Rossetti, MD       Or  . phenol (CHLORASEPTIC) mouth spray 1 spray  1 spray Mouth/Throat PRN Mcarthur Rossetti, MD      . methocarbamol (ROBAXIN) tablet 500 mg  500 mg Oral Q6H PRN Mcarthur Rossetti, MD   500 mg at 03/20/20 [id number]   Or  . methocarbamol (ROBAXIN) 500 mg in dextrose 5 % 50 mL IVPB  500 mg Intravenous Q6H PRN Mcarthur Rossetti, MD      . metoCLOPramide (REGLAN) tablet 5-10 mg  5-10 mg Oral Q8H PRN Mcarthur Rossetti, MD       Or  . metoCLOPramide (REGLAN) injection 5-10 mg  5-10 mg Intravenous Q8H PRN Mcarthur Rossetti, MD      . morphine 2 MG/ML injection 0.5-1 mg  0.5-1 mg Intravenous Q2H PRN Mcarthur Rossetti, MD   1 mg at 03/16/20 2221  . nitroGLYCERIN (NITROSTAT) SL tablet 0.4 mg  0.4 mg Sublingual Q5 min PRN Mcarthur Rossetti, MD      . ondansetron [facility]) tablet 4 mg  4 mg Oral Q6H PRN Mcarthur Rossetti, MD       Or  . ondansetron [facility]) injection 4 mg  4 mg Intravenous Q6H PRN Mcarthur Rossetti, MD      . pantoprazole (PROTONIX) EC tablet 40 mg  40 mg Oral Daily Mcarthur Rossetti, MD   40 mg at 03/20/20 [id number]  . polyethylene glycol (MIRALAX / GLYCOLAX) packet 17 g  17 g Oral Daily  PRN Mcarthur Rossetti, MD   17 g at 03/18/20 1414     Discharge Medications: Please see discharge summary for a list of discharge medications.  Relevant Imaging Results:  Relevant Lab Results:   Additional Information SSN [id number]  [address], [place]

## 2020-03-20 NOTE — Progress Notes (Signed)
Physical Therapy Treatment Patient Details Name: Heather [name] MRN: [id number] DOB: [birth year] Today's Date: 03/20/2020    History of Present Illness Pt is 87 yo female s/p L anterior THA on 03/16/20.  She has PMH including arthritis L hip, HTN, GERD, CAD, pacemaker.    PT Comments    Pt with extreme pain in standing today, limiting her progress with mobility. Pt reporting increased numbness/tingling/burning sensations in her L thigh with noted edema. Educated pt on elevating L leg and performing ankle pumps and applying ice to assist in edema management and ideally decrease pressure that may be pushing on nerves in leg. Pt very motivated to improve, performing therapeutic exercises to warm-up this date. Pt exhibits L hip flexor weakness, needing AAROM for sitting marching and displaying L foot drag with gait. Educated pt on hip precautions, good compliance. Will continue to follow acutely. Current recommendations remain appropriate.   Follow Up Recommendations  Follow surgeon's recommendation for DC plan and follow-up therapies;Supervision/Assistance - 24 hour     Equipment Recommendations  Rolling walker with 5" wheels;Other (comment) (youth height)    Recommendations for Other Services       Precautions / Restrictions Precautions Precautions: Fall Restrictions Weight Bearing Restrictions: Yes LLE Weight Bearing: Weight bearing as tolerated    Mobility  Bed Mobility               General bed mobility comments: Pt sitting up EOB upon arrival.    Transfers Overall transfer level: Needs assistance Equipment used: Rolling walker (2 wheeled) Transfers: Sit to/from Stand Sit to Stand: Min guard         General transfer comment: Cues for hand placement and transition to RW, min guard for safety. No overt LOB. Extra time to rise.  Ambulation/Gait Ambulation/Gait assistance: Min guard Gait Distance (Feet): 20 Feet (x2 bouts of ~3 ft > ~20 ft) Assistive device: Rolling  walker (2 wheeled) Gait Pattern/deviations: Antalgic;Step-through pattern;Decreased dorsiflexion - left;Decreased stride length Gait velocity: decreased Gait velocity interpretation: <1.31 ft/sec, indicative of household ambulator General Gait Details: Pt with improved L leg advancement with L step, but drags L foot despite verbal cues. Pt required several standing rest breaks due to burning pain in thigh. Pt very limited in advancing mobility due to pain this date. Chair follow and min guard for safety.   Stairs             Wheelchair Mobility    Modified Rankin (Stroke Patients Only)       Balance Overall balance assessment: Needs assistance Sitting-balance support: No upper extremity supported Sitting balance-Leahy Scale: Good Sitting balance - Comments: Supervision for safety, sitting EOB upon arrival.   Standing balance support: Bilateral upper extremity supported;During functional activity Standing balance-Leahy Scale: Poor Standing balance comment: Bil UE on RW                            Cognition Arousal/Alertness: Awake/alert Behavior During Therapy: WFL for tasks assessed/performed Overall Cognitive Status: Within Functional Limits for tasks assessed                                 General Comments: motivated      Exercises Total Joint Exercises Ankle Circles/Pumps: AROM;Left;20 reps;Seated Hip ABduction/ADduction: Both;10 reps;Seated (isometric hip abd/add) Long Arc Quad: Left;15 reps;Seated Other Exercises Other Exercises: Seated L hip marching, x10 reps with Heather [name]  General Comments        Pertinent Vitals/Pain Pain Assessment: 0-10 Pain Score: 10-Worst pain ever Pain Location: L hip Pain Descriptors / Indicators: Discomfort;Grimacing;Guarding;Operative site guarding;Crying;Burning;Numbness;Tingling Pain Intervention(s): Limited activity within patient's tolerance;Monitored during session;Repositioned;Patient requesting  pain meds-RN notified    Home Living                      Prior Function            PT Goals (current goals can now be found in the care plan section) Acute Rehab PT Goals Patient Stated Goal: decrease hip pain, walk better PT Goal Formulation: With patient Time For Goal Achievement: 03/30/20 Potential to Achieve Goals: Good Progress towards PT goals: Progressing toward goals    Frequency    7X/week      PT Plan Current plan remains appropriate    Co-evaluation              AM-PAC PT "6 Clicks" Mobility   Outcome Measure  Help needed turning from your back to your side while in a flat bed without using bedrails?: A Little Help needed moving from lying on your back to sitting on the side of a flat bed without using bedrails?: A Little Help needed moving to and from a bed to a chair (including a wheelchair)?: A Little Help needed standing up from a chair using your arms (e.g., wheelchair or bedside chair)?: A Little Help needed to walk in hospital room?: A Little Help needed climbing 3-5 steps with a railing? : A Lot 6 Click Score: 17    End of Session Equipment Utilized During Treatment: Gait belt Activity Tolerance: Patient limited by pain;Patient tolerated treatment well Patient left: in chair;with call bell/phone within reach;with nursing/sitter in room Nurse Communication: Mobility status;Patient requests pain meds PT Visit Diagnosis: Other abnormalities of gait and mobility (R26.89);Muscle weakness (generalized) (M62.81);Unsteadiness on feet (R26.81);Difficulty in walking, not elsewhere classified (R26.2);Pain Pain - Right/Left: Left Pain - part of body: Hip     Time: [id number] PT Time Calculation (min) (ACUTE ONLY): 29 min  Charges:  $Gait Training: 8-22 mins $Therapeutic Exercise: 8-22 mins                     Heather [name], PT, DPT Acute Rehabilitation Services  Pager: [phone] Office: [phone]    Heather [name] 03/20/2020,  9:02 AM

## 2020-03-30 ENCOUNTER — Other Ambulatory Visit: Payer: Self-pay

## 2020-03-30 ENCOUNTER — Encounter: Payer: Self-pay | Admitting: Physician Assistant

## 2020-03-30 ENCOUNTER — Ambulatory Visit (INDEPENDENT_AMBULATORY_CARE_PROVIDER_SITE_OTHER): Payer: Medicare Other | Admitting: Physician Assistant

## 2020-03-30 DIAGNOSIS — Z96642 Presence of left artificial hip joint: Secondary | ICD-10-CM

## 2020-03-30 NOTE — Progress Notes (Signed)
HPI: Heather [name] returns today 2 weeks status post left total hip arthroplasty.  She states she is overall doing well.  She has no complaints.  She is on chronic anticoagulation.  She is currently at [facility].  She is due to be discharged tomorrow.  She is walking some with a walker.  States the hip feels improved since undergoing surgery.  No shortness of breath fevers chills.  Physical exam: Left hip fluid range of motion.  Surgical incisions healing well no signs of infection.  No dehiscence of the wound.  Calf supple nontender.  Dorsiflexion plantarflexion left ankle intact.  Able to transfer with minimal assistance from the exam table to wheelchair.  Impression status post left total hip arthroplasty 03/16/2020  Plan: Staples removed Steri-Strips applied.  She is able to get the incision wet in the shower.  Not unable to submerge the incision in the bath.  She will work on scar tissue mobilization.  Continue to work on strength gait and balance.  Follow-up with [place] in 4 weeks sooner if there is any questions concerns.  Discussed with her at length that she needs to keep the proximal portion of the incisions clean and dry.                                                                                                                                                       +++++++++

## 2020-04-30 ENCOUNTER — Telehealth: Payer: Self-pay | Admitting: Cardiology

## 2020-04-30 NOTE — Telephone Encounter (Signed)
Spoke to the patient just now and let her know Dr. Munley's recommendations. She verbalizes understanding.  

## 2020-04-30 NOTE — Telephone Encounter (Signed)
I reviewed my office notes she has no history of heart failure  She has had recent orthopedic surgery and skilled nursing visit  I would send this message to her family doctor I do not think it something for me to solve over the phone as a cardiologist.

## 2020-04-30 NOTE — Telephone Encounter (Signed)
Pt c/o swelling: STAT is pt has developed SOB within 24 hours  1) How much weight have you gained and in what time span? No  2) If swelling, where is the swelling located?  Left lower leg from knee dow  3) Are you currently taking a fluid pill? Yes   4) Are you currently SOB? No denies  5) Do you have a log of your daily weights (if so, list)?   6) Have you gained 3 pounds in a day or 5 pounds in a week?   7) Have you traveled recently? No  Per Otila Kluver, pt had recent surgery 03/16/20, Otila Kluver wants to know if pt can take '20mg'$  fluid pill as needed? Pt's BP is currently 149/74

## 2020-04-30 NOTE — Telephone Encounter (Signed)
Spoke with home health nurse. She reports patient is having left leg swelling from knee down. She also has a trace of swelling in right ankle. Home health nurse noted crackles in both lungs today upon ausculation. Patient had left hip surgery on 03/16/20. No shortness of breath complaint, no weight gain. Home health nurse wondering if we can add lasix to help with swelling/fluid? Will check with Dr. Bettina Gavia. No pain or redness to left leg.

## 2020-05-03 ENCOUNTER — Ambulatory Visit (INDEPENDENT_AMBULATORY_CARE_PROVIDER_SITE_OTHER): Payer: Medicare Other | Admitting: Orthopaedic Surgery

## 2020-05-03 ENCOUNTER — Encounter: Payer: Self-pay | Admitting: Orthopaedic Surgery

## 2020-05-03 DIAGNOSIS — Z96642 Presence of left artificial hip joint: Secondary | ICD-10-CM

## 2020-05-03 NOTE — Progress Notes (Signed)
The patient is an 87 year old female who is now 6 weeks status post a left total hip arthroplasty.  She reports that she is doing much better overall.  She has good motion and strength in that hip.  Her family is with her said it is hard to keep her down.  She does have chronic bilateral knee pain and she has had some left foot and ankle swelling since surgery.  I did counsel her about wearing compressive garments and that she can have some swelling for a while.  On exam her left operative hip moves smoothly and fluidly.  She does have some ankle and foot swelling but her calf is soft.  I can move both knees easily but you can tell that there is arthritis in both knees.  From my standpoint she will continue to increase her activities as comfort allows.  The next time I really need to see her back for her left hip with the 6 months from now with a repeat x-rays of the pelvis and left hip.  If there is issues before then they will let us know.  All questions and concerns were answered and addressed.

## 2020-05-10 ENCOUNTER — Telehealth: Payer: Self-pay | Admitting: Cardiology

## 2020-05-10 NOTE — Telephone Encounter (Signed)
Returned call to Pt.  Advised to continue amiodarone and will schedule 6 mo f/u to discuss.  Pt scheduled for May 18, 2020 at 1:45 pm with Dr. Quentin Ore.  Pt thanked nurse for call.

## 2020-05-10 NOTE — Telephone Encounter (Signed)
Pt c/o medication issue:  1. Name of Medication: amiodarone (PACERONE) 200 MG tablet  2. How are you currently taking this medication (dosage and times per day)? As Prescribed  3. Are you having a reaction (difficulty breathing--STAT)? Yes  4. What is your medication issue?PT is calling with concerns about the medication.PT states the medication is making her hair fall out.She wants to know is it ok for her to stop taking the medication.Please advise

## 2020-05-18 ENCOUNTER — Encounter: Payer: Self-pay | Admitting: Cardiology

## 2020-05-18 ENCOUNTER — Ambulatory Visit: Payer: Medicare Other | Admitting: Cardiology

## 2020-05-18 ENCOUNTER — Other Ambulatory Visit: Payer: Self-pay

## 2020-05-18 VITALS — BP 142/70 | HR 83 | Ht 59.0 in | Wt 176.6 lb

## 2020-05-18 DIAGNOSIS — I480 Paroxysmal atrial fibrillation: Secondary | ICD-10-CM | POA: Diagnosis not present

## 2020-05-18 DIAGNOSIS — I495 Sick sinus syndrome: Secondary | ICD-10-CM | POA: Diagnosis not present

## 2020-05-18 DIAGNOSIS — Z79899 Other long term (current) drug therapy: Secondary | ICD-10-CM

## 2020-05-18 NOTE — Patient Instructions (Addendum)
Medication Instructions:  Your physician recommends that you continue on your current medications as directed. Please refer to the Current Medication list given to you today. *If you need a refill on your cardiac medications before your next appointment, please call your pharmacy*  Lab Work: You will get lab work today:  CMP, TSH and free T4 If you have labs (blood work) drawn today and your tests are completely normal, you will receive your results only by: Marland Kitchen MyChart Message (if you have MyChart) OR . A paper copy in the mail If you have any lab test that is abnormal or we need to change your treatment, we will call you to review the results.  Testing/Procedures: None ordered.  Follow-Up: At [facility], you and your health needs are our priority.  As part of our continuing mission to provide you with exceptional heart care, we have created designated Provider Care Teams.  These Care Teams include your primary Cardiologist (physician) and Advanced Practice Providers (APPs -  Physician Assistants and Nurse Practitioners) who all work together to provide you with the care you need, when you need it.  Your next appointment:   Your physician wants you to follow-up in: 6 months with Dr. Quentin Ore.   You will receive a reminder letter in the mail two months in advance. If you don't receive a letter, please call our office to schedule the follow-up appointment.  Remote monitoring is used to monitor your Pacemaker from home. This monitoring reduces the number of office visits required to check your device to one time per year. It allows [place] to keep an eye on the functioning of your device to ensure it is working properly. You are scheduled for a device check from home on 05/21/2020. You may send your transmission at any time that day. If you have a wireless device, the transmission will be sent automatically. After your physician reviews your transmission, you will receive a postcard with your next  transmission date.

## 2020-05-18 NOTE — Progress Notes (Signed)
Electrophysiology Office Follow up Visit Note:    Date:  05/18/2020   ID:  Heather [name], DOB [birth year], MRN [id number]  PCP:  Leonard Downing, MD  [place] Cardiologist:  Shirlee More, MD  [facility] Electrophysiologist:  Vickie Epley, MD    Interval History:    Heather [name] is a 87 y.o. female who presents for a follow up visit.  I last saw the patient November 26, 2019 for sinus node dysfunction post permanent pacemaker implant and atrial fibrillation.  For her atrial fibrillation she had been maintained on Eliquis and amiodarone.  Patient is concerned that the amiodarone may be contributing to hair loss.  She has had her left hip replaced since I last saw her.  She tells me that the surgery went well and she is doing much better.      Past Medical History:  Diagnosis Date  . Anxiety attack 01/03/2016  . Aphasia 01/03/2016  . Arthritis   . CAD (coronary artery disease) of artery bypass graft, s/p PCI stent to VG->RCA 04/30/15 07/14/2013   Catheterization August/2003 showing severe three-vessel disease and moderate mitral regurgitation 08/28/2001 CABG with internal mammary graft to LAD, vein graft to circumflex, and vein graft to posterolateral branch and mitral valve repair by Dr. 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 Patent internal ma  . CAD (coronary artery disease), native coronary artery 07/14/2013   Catheterization August/2003 showing severe three-vessel disease and moderate mitral regurgitation 08/28/2001 CABG with internal mammary graft to LAD, vein graft to circumflex, and vein graft to posterolateral branch and mitral valve repair by Dr. 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 Patent internal mammary graft with competitive flow in the distal vessel, occluded right  coronary artery, occluded circumflex, patent saphenous vein graft to circumflex with mild/moderate disease proximally, minimal LV damage   . Chest pain 08/20/2019  . Chronic diastolic CHF (congestive heart failure) (HCC)    a. EF 60% by LV gram 01/2009.  . Coronary artery disease    a. s/p MI in 1980;  b. 2003 CABG x 3 (LIMA->LAD, VG->OM, VG->RPL);  c. 01/2009 Cath: 3/3 patent grafts, native 3VD, EF 60%.  . Diverticulitis   . Dyspnea 01/03/2016  . Elevated troponin   . GERD (gastroesophageal reflux disease)   . H/O mitral valve repair    a. 2003  . History of kidney stones   . Hyperlipidemia 07/14/2013   Intolerance to multiple statins including Crestor, pravastatin, and Lipitor   . Hypertension   . Hypertension, uncontrolled 07/19/2014  . Hypertensive heart disease 07/14/2013  . Hypothyroidism   . NSTEMI (non-ST elevated myocardial infarction) ([place])   . Other hyperlipidemia   . PAF (paroxysmal atrial fibrillation) ([place])   . Pneumonia   . Presence of permanent cardiac pacemaker 08/22/2019  . S/P partial hysterectomy 1977  . Sinus node dysfunction [facility])     Past Surgical History:  Procedure Laterality Date  . ABDOMINAL HYSTERECTOMY  1977  . APPENDECTOMY  1950  . BLADDER SUSPENSION     tac  . CARDIAC CATHETERIZATION  03,05,11  . CARDIAC CATHETERIZATION N/A 04/30/2015   Procedure: Left Heart Cath and Cors/Grafts Angiography;  Surgeon: Belva Crome, MD;  Location: [place] CV LAB;  Service: Cardiovascular;  Laterality: N/A;  . CARDIAC CATHETERIZATION N/A 04/30/2015  Procedure: Coronary Stent Intervention;  Surgeon: Belva Crome, MD;  Location: [place] CV LAB;  Service: Cardiovascular;  Laterality: N/A;  Proximal and Distal SVG to the PLA  . CARDIAC CATHETERIZATION N/A 01/06/2016   Procedure: Left Heart Cath and Cors/Grafts Angiography;  Surgeon: Jettie Booze, MD;  Location: [place] CV LAB;  Service: Cardiovascular;  Laterality: N/A;  . CARPAL TUNNEL RELEASE     right  and left  . CHOLECYSTECTOMY    . CORONARY ARTERY BYPASS GRAFT  2003  . KNEE ARTHROSCOPY     left  . LEFT HEART CATHETERIZATION WITH CORONARY ANGIOGRAM N/A 07/12/2013   Procedure: LEFT HEART CATHETERIZATION WITH CORONARY ANGIOGRAM;  Surgeon: Jettie Booze, MD;  Location: [facility] C[facility] LAB;  Service: Cardiovascular;  Laterality: N/A;  . LEFT TOTAL HIP ARTHROPLASTY ANTERIOR APPROACH (Left Hip)  03/16/2020  . MITRAL VALVE REPAIR  2003   with cabg  . PACEMAKER IMPLANT N/A 08/22/2019   Procedure: PACEMAKER IMPLANT;  Surgeon: Constance Haw, MD;  Location: [place] CV LAB;  Service: Cardiovascular;  Laterality: N/A;  . THYROIDECTOMY  1968  . THYROIDECTOMY, PARTIAL     left  . TOTAL HIP ARTHROPLASTY Left 03/16/2020   Procedure: LEFT TOTAL HIP ARTHROPLASTY ANTERIOR APPROACH;  Surgeon: Mcarthur Rossetti, MD;  Location: [place];  Service: Orthopedics;  Laterality: Left;    Current Medications: Current Meds  Medication Sig  . acetaminophen (TYLENOL) 325 MG tablet Take 1-2 tablets (325-650 mg total) by mouth every 6 (six) hours as needed for mild pain (pain score 1-3 or temp > 100.5).  Marland Kitchen amiodarone (PACERONE) 200 MG tablet Take 2 tablets by mouth for 7 days.  Then decrease to one tablet by mouth daily. (Patient taking differently: Take 2 tablets by mouth for 7 days.  Then decrease to one tablet by mouth daily.)  . amLODipine (NORVASC) 2.5 MG tablet TAKE 1 TABLET BY MOUTH ONCE DAILY NEEDS  APPOINTMENT  WITH  CARDIOLOGIST  . cholecalciferol (VITAMIN D3) 25 MCG (1000 UNIT) tablet Take 1,000 Units by mouth daily.  . clopidogrel (PLAVIX) 75 MG tablet Take 1 tablet (75 mg total) by mouth daily.  Marland Kitchen ezetimibe (ZETIA) 10 MG tablet Take 1 tablet (10 mg total) by mouth daily.  . famotidine (PEPCID) 10 MG tablet Take 10 mg by mouth daily as needed for heartburn or indigestion.  Marland Kitchen levothyroxine (SYNTHROID, LEVOTHROID) 50 MCG tablet Take 50 mcg by mouth daily before breakfast.  . nitroGLYCERIN (NITROSTAT)  0.4 MG SL tablet Place 0.4 mg under the tongue every 5 (five) minutes as needed for chest pain.     Allergies:   Atorvastatin, Codeine, Crestor [rosuvastatin], Pravastatin, Simvastatin, and Ticagrelor   Social History   Socioeconomic History  . Marital status: Widowed    Spouse name: Not on file  . Number of children: Not on file  . Years of education: Not on file  . Highest education level: Not on file  Occupational History  . Not on file  Tobacco Use  . Smoking status: Former Smoker    Quit date: 06/06/1967    Years since quitting: 52.9  . Smokeless tobacco: Former Network engineer  . Vaping Use: Never used  Substance and Sexual Activity  . Alcohol use: No  . Drug use: No  . Sexual activity: Not Currently  Other Topics Concern  . Not on file  Social History Narrative   Lives in [place].  Widowed.   Social Determinants of Health   Financial  Resource Strain: Not on file  Food Insecurity: Not on file  Transportation Needs: Not on file  Physical Activity: Not on file  Stress: Not on file  Social Connections: Not on file     Family History: The patient's family history includes Dementia in her mother and sister; Heart Problems in her father, paternal grandfather, and sister; Other in an other family member; Tuberculosis in her maternal grandmother.  ROS:   Please see the history of present illness.    All other systems reviewed and are negative.  EKGs/Labs/Other Studies Reviewed:    The following studies were reviewed today:    EKG:  The ekg ordered today demonstrates right bundle branch block and atrially pacing.    Recent Labs: 08/20/2019: B Natriuretic Peptide 146.8 09/15/2019: Magnesium 2.0; TSH 1.730 03/17/2020: BUN 12; Creatinine, Ser 0.79; Hemoglobin 10.0; Platelets 183; Potassium 4.5; Sodium 135  Recent Lipid Panel    Component Value Date/Time   CHOL 180 04/30/2015 0548   TRIG 249 (H) 04/30/2015 0548   HDL 27 (L) 04/30/2015 0548   CHOLHDL 6.7  04/30/2015 0548   VLDL 50 (H) 04/30/2015 0548   LDLCALC 103 (H) 04/30/2015 0548    Physical Exam:    VS:  BP (!) 142/70   Pulse 83   Ht '4\' 11"'  (1.499 m)   Wt 176 lb 9.6 oz (80.1 kg)   SpO2 94%   BMI 35.67 kg/m     Wt Readings from Last 3 Encounters:  05/18/20 176 lb 9.6 oz (80.1 kg)  03/16/20 176 lb (79.8 kg)  03/12/20 176 lb (79.8 kg)     GEN:  Well nourished, well developed in no acute distress HEENT: Normal NECK: No JVD; No carotid bruits LYMPHATICS: No lymphadenopathy CARDIAC: RRR, no murmurs, rubs, gallops RESPIRATORY:  Clear to auscultation without rales, wheezing or rhonchi  ABDOMEN: Soft, non-tender, non-distended MUSCULOSKELETAL: 1+ pitting left lower extremity edema; No deformity  SKIN: Warm and dry NEUROLOGIC:  Alert and oriented x 3 PSYCHIATRIC:  Normal affect   ASSESSMENT:    1. Sinus node dysfunction (HCC)   2. PAF (paroxysmal atrial fibrillation) ([place])   3. Encounter for long-term (current) use of high-risk medication    PLAN:    In order of problems listed above:  1.  Paroxysmal atrial fibrillation Maintaining sinus rhythm on amiodarone.  Continue current dose of 200 mg by mouth daily.  Continue Eliquis for stroke prophylaxis.  We will check a CMP, TSH and free T4 today.  Plan to see back in 6 months.  2.  Sinus node dysfunction Pacemaker interrogation shows stable lead parameters and good battery longevity.  Medication Adjustments/Labs and Tests Ordered: Current medicines are reviewed at length with the patient today.  Concerns regarding medicines are outlined above.  Orders Placed This Encounter  Procedures  . Comp Met (CMET)  . TSH  . T4, free  . EKG 12-Lead   No orders of the defined types were placed in this encounter.    Signed, Lars Mage, MD, [facility], [facility] 05/18/2020 2:02 PM    Electrophysiology [facility]

## 2020-05-19 LAB — COMPREHENSIVE METABOLIC PANEL
ALT: 16 IU/L (ref 0–32)
AST: 18 IU/L (ref 0–40)
Albumin/Globulin Ratio: 2 (ref 1.2–2.2)
Albumin: 4.8 g/dL — ABNORMAL HIGH (ref 3.6–4.6)
Alkaline Phosphatase: 98 IU/L (ref 44–121)
BUN/Creatinine Ratio: 13 (ref 12–28)
BUN: 12 mg/dL (ref 8–27)
Bilirubin Total: 0.7 mg/dL (ref 0.0–1.2)
CO2: 23 mmol/L (ref 20–29)
Calcium: 9.8 mg/dL (ref 8.7–10.3)
Chloride: 99 mmol/L (ref 96–106)
Creatinine, Ser: 0.94 mg/dL (ref 0.57–1.00)
Globulin, Total: 2.4 g/dL (ref 1.5–4.5)
Glucose: 91 mg/dL (ref 65–99)
Potassium: 4.4 mmol/L (ref 3.5–5.2)
Sodium: 139 mmol/L (ref 134–144)
Total Protein: 7.2 g/dL (ref 6.0–8.5)
eGFR: 58 mL/min/{1.73_m2} — ABNORMAL LOW (ref >59)

## 2020-05-19 LAB — TSH: TSH: 0.597 u[IU]/mL (ref 0.450–4.500)

## 2020-05-19 LAB — T4, FREE: Free T4: 1.68 ng/dL (ref 0.82–1.77)

## 2020-05-21 ENCOUNTER — Ambulatory Visit (INDEPENDENT_AMBULATORY_CARE_PROVIDER_SITE_OTHER): Payer: Medicare Other

## 2020-05-21 DIAGNOSIS — I495 Sick sinus syndrome: Secondary | ICD-10-CM

## 2020-05-21 LAB — CUP PACEART REMOTE DEVICE CHECK
Battery Remaining Longevity: 160 mo
Battery Voltage: 3.13 V
Brady Statistic AP VP Percent: 0.09 %
Brady Statistic AP VS Percent: 94.32 %
Brady Statistic AS VP Percent: 0.03 %
Brady Statistic AS VS Percent: 5.56 %
Brady Statistic RA Percent Paced: 94.41 %
Brady Statistic RV Percent Paced: 0.12 %
Date Time Interrogation Session: 20220506022231
Implantable Lead Implant Date: 20210806
Implantable Lead Implant Date: 20210806
Implantable Lead Location: 753859
Implantable Lead Location: 753860
Implantable Lead Model: 5076
Implantable Lead Model: 5076
Implantable Pulse Generator Implant Date: 20210806
Lead Channel Impedance Value: 323 Ohm
Lead Channel Impedance Value: 437 Ohm
Lead Channel Impedance Value: 494 Ohm
Lead Channel Impedance Value: 513 Ohm
Lead Channel Pacing Threshold Amplitude: 0.625 V
Lead Channel Pacing Threshold Amplitude: 0.875 V
Lead Channel Pacing Threshold Pulse Width: 0.4 ms
Lead Channel Pacing Threshold Pulse Width: 0.4 ms
Lead Channel Sensing Intrinsic Amplitude: 11.125 mV
Lead Channel Sensing Intrinsic Amplitude: 11.125 mV
Lead Channel Sensing Intrinsic Amplitude: 3.25 mV
Lead Channel Sensing Intrinsic Amplitude: 3.25 mV
Lead Channel Setting Pacing Amplitude: 1.5 V
Lead Channel Setting Pacing Amplitude: 2 V
Lead Channel Setting Pacing Pulse Width: 0.4 ms
Lead Channel Setting Sensing Sensitivity: 1.2 mV

## 2020-05-31 DIAGNOSIS — J189 Pneumonia, unspecified organism: Secondary | ICD-10-CM | POA: Insufficient documentation

## 2020-05-31 DIAGNOSIS — K5792 Diverticulitis of intestine, part unspecified, without perforation or abscess without bleeding: Secondary | ICD-10-CM | POA: Insufficient documentation

## 2020-05-31 DIAGNOSIS — Z87442 Personal history of urinary calculi: Secondary | ICD-10-CM | POA: Insufficient documentation

## 2020-06-03 NOTE — Progress Notes (Signed)
Remote pacemaker transmission.   

## 2020-06-18 ENCOUNTER — Ambulatory Visit: Payer: Medicare Other | Admitting: Cardiology

## 2020-07-29 ENCOUNTER — Ambulatory Visit: Payer: Medicare Other | Admitting: Cardiology

## 2020-07-29 ENCOUNTER — Other Ambulatory Visit: Payer: Self-pay

## 2020-07-29 ENCOUNTER — Encounter: Payer: Self-pay | Admitting: Cardiology

## 2020-07-29 VITALS — BP 140/78 | HR 76 | Ht 59.0 in | Wt 176.4 lb

## 2020-07-29 DIAGNOSIS — Z950 Presence of cardiac pacemaker: Secondary | ICD-10-CM

## 2020-07-29 DIAGNOSIS — I480 Paroxysmal atrial fibrillation: Secondary | ICD-10-CM | POA: Diagnosis not present

## 2020-07-29 DIAGNOSIS — I110 Hypertensive heart disease with heart failure: Secondary | ICD-10-CM

## 2020-07-29 DIAGNOSIS — I25708 Atherosclerosis of coronary artery bypass graft(s), unspecified, with other forms of angina pectoris: Secondary | ICD-10-CM

## 2020-07-29 DIAGNOSIS — Z9889 Other specified postprocedural states: Secondary | ICD-10-CM

## 2020-07-29 DIAGNOSIS — Z7901 Long term (current) use of anticoagulants: Secondary | ICD-10-CM

## 2020-07-29 DIAGNOSIS — Z79899 Other long term (current) drug therapy: Secondary | ICD-10-CM | POA: Diagnosis not present

## 2020-07-29 DIAGNOSIS — I5032 Chronic diastolic (congestive) heart failure: Secondary | ICD-10-CM

## 2020-07-29 DIAGNOSIS — E782 Mixed hyperlipidemia: Secondary | ICD-10-CM

## 2020-07-29 DIAGNOSIS — Z789 Other specified health status: Secondary | ICD-10-CM

## 2020-07-29 NOTE — Progress Notes (Signed)
Cardiology Office Note:    Date:  07/29/2020   ID:  Heather [name], DOB July 15, 1930, MRN [id number]  PCP:  Leonard Downing, MD  Cardiologist:  Shirlee More, MD    Referring MD: Leonard Downing, *    ASSESSMENT:    1. PAF (paroxysmal atrial fibrillation) ([place])   2. On amiodarone therapy   3. Chronic anticoagulation   4. Coronary artery disease of bypass graft of native heart with stable angina pectoris ([place])   5. H/O mitral valve repair   6. Hypertensive heart disease with chronic diastolic congestive heart failure ([place])   7. Mixed hyperlipidemia   8. Statin intolerance   9. Pacemaker    PLAN:    In order of problems listed above:  She is doing well maintaining sinus rhythm we will continue amiodarone and her current anticoagulant without bleeding complication Stable CAD no angina on current medical therapy following CABG and good long-term durable result from mitral valve repair with no significant mitral regurgitation or signs of heart failure.  Continue current treatment including lipid-lowering with nonstatin Zetia she is intolerant. BP at target no evidence of heart failure continue her calcium channel blocker for BP control Continue nonstatin await labs that were done 2 months ago Stable pacemaker function she is predominantly atrially paced we will continue to follow in device clinic   Next appointment: 6 months   Medication Adjustments/Labs and Tests Ordered: Current medicines are reviewed at length with the patient today.  Concerns regarding medicines are outlined above.  Orders Placed This Encounter  Procedures   EKG 12-Lead    No orders of the defined types were placed in this encounter.   Chief Complaint  Patient presents with   Follow-up   Atrial Fibrillation     History of Present Illness:    Heather [name] is a 87 y.o. female with a hx of CAD with CABG and mitral valve repair 2003 inferior ST elevation MI 2015 with saphenous vein  graft PCI to the posterolateral artery paroxysmal atrial fibrillation hyperlipidemia statin intolerance hyperlipidemia on amiodarone.  She wants last seen 12/19/2019 preoperative evaluation maintaining sinus rhythm..  Compliance with diet, lifestyle and medications: Yes  In the interim she had successful total hip arthroplasty and she is a new person she no longer has pain she is more active she enjoys every day of life and tells me she feels marvelously. She has had no chest pain edema shortness of breath palpitation or syncope.  She is maintaining sinus rhythm.  She had labs drawn May 3 I can see that her TSH 0.59 and creatinine 0.94 were normal and full reports are requested. Her last pacemaker check remote 05/21/2020 show her to be ventricularly paced less than 1% normal device function and lead parameters and she has had no recurrent atrial tachycardias 08/21/2019 Echo reviewed her ejection fraction remains normal and she has had a good  result from mitral valve surgery  1. Hypokinesis of the basal inferior and inferolateral wall with overall  preserved LV function; mild LVH; s/p MV repair with mild MS (mean gradient  5 mmHg) and mild MR; mild LAE; mild RV dysfunction.   2. Left ventricular ejection fraction, by estimation, is 55 to 60%. The  left ventricle has normal function. The left ventricle demonstrates  regional wall motion abnormalities (see scoring diagram/findings for  description). There is mild left ventricular   hypertrophy. Left ventricular diastolic parameters are indeterminate.   3. Right ventricular systolic function is mildly  reduced. The right  ventricular size is normal. There is normal pulmonary artery systolic  pressure.   4. Left atrial size was mildly dilated.   5. The mitral valve has been repaired/replaced. Mild mitral valve  regurgitation. Mild mitral stenosis.   6. The aortic valve is tricuspid. Aortic valve regurgitation is not  visualized. Mild to moderate  aortic valve sclerosis/calcification is  present, without any evidence of aortic stenosis.   7. The inferior vena cava is normal in size with greater than 50%  respiratory variability, suggesting right atrial pressure of 3 mmHg.   Past Medical History:  Diagnosis Date   Anxiety attack 01/03/2016   Aphasia 01/03/2016   Arthritis    CAD (coronary artery disease) of artery bypass graft, s/p PCI stent to VG->RCA 04/30/15 07/14/2013   Catheterization August/2003 showing severe three-vessel disease and moderate mitral regurgitation 08/28/2001 CABG with internal mammary graft to LAD, vein graft to circumflex, and vein graft to posterolateral branch and mitral valve repair by Dr. 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 Patent internal ma   CAD (coronary artery disease), native coronary artery 07/14/2013   Catheterization August/2003 showing severe three-vessel disease and moderate mitral regurgitation 08/28/2001 CABG with internal mammary graft to LAD, vein graft to circumflex, and vein graft to posterolateral branch and mitral valve repair by Dr. 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 Patent internal mammary graft with competitive flow in the distal vessel, occluded right coronary artery, occluded circumflex, patent saphenous vein graft to circumflex with mild/moderate disease proximally, minimal LV damage    Chest pain 08/20/2019   Chronic diastolic CHF (congestive heart failure) ([place])    a. EF 60% by LV gram 01/2009.   Coronary artery disease    a. s/p MI in 1980;  b. 2003 CABG x 3 (LIMA->LAD, VG->OM, VG->RPL);  c. 01/2009 Cath: 3/3 patent grafts, native 3VD, EF 60%.   Diverticulitis    Dyspnea 01/03/2016   Elevated troponin    GERD (gastroesophageal reflux disease)    H/O mitral valve repair    a. 2003   History of  kidney stones    Hyperlipidemia 07/14/2013   Intolerance to multiple statins including Crestor, pravastatin, and Lipitor    Hypertension    Hypertension, uncontrolled 07/19/2014   Hypertensive heart disease 07/14/2013   Hypothyroidism    NSTEMI (non-ST elevated myocardial infarction) ([place])    Other hyperlipidemia    PAF (paroxysmal atrial fibrillation) ([place])    Pneumonia    Presence of permanent cardiac pacemaker 08/22/2019   S/P partial hysterectomy 1977   Sinus node dysfunction [facility])     Past Surgical History:  Procedure Laterality Date   ABDOMINAL HYSTERECTOMY  1977   APPENDECTOMY  1950   BLADDER SUSPENSION     tac   CARDIAC CATHETERIZATION  03,05,11   CARDIAC CATHETERIZATION N/A 04/30/2015   Procedure: Left Heart Cath and Cors/Grafts Angiography;  Surgeon: Belva Crome, MD;  Location: [place] CV LAB;  Service: Cardiovascular;  Laterality: N/A;   CARDIAC CATHETERIZATION N/A 04/30/2015   Procedure: Coronary Stent Intervention;  Surgeon: Belva Crome, MD;  Location: [place] CV LAB;  Service: Cardiovascular;  Laterality: N/A;  Proximal and Distal SVG to the PLA   CARDIAC CATHETERIZATION N/A 01/06/2016   Procedure: Left Heart Cath and Cors/Grafts Angiography;  Surgeon: Charlann Lange  Irish Lack, MD;  Location: [place] CV LAB;  Service: Cardiovascular;  Laterality: N/A;   CARPAL TUNNEL RELEASE     right and left   CHOLECYSTECTOMY     CORONARY ARTERY BYPASS GRAFT  2003   KNEE ARTHROSCOPY     left   LEFT HEART CATHETERIZATION WITH CORONARY ANGIOGRAM N/A 07/12/2013   Procedure: LEFT HEART CATHETERIZATION WITH CORONARY ANGIOGRAM;  Surgeon: Jettie Booze, MD;  Location: [facility] CATH LAB;  Service: Cardiovascular;  Laterality: N/A;   LEFT TOTAL HIP ARTHROPLASTY ANTERIOR APPROACH (Left Hip)  03/16/2020   MITRAL VALVE REPAIR  2003   with cabg   PACEMAKER IMPLANT N/A 08/22/2019   Procedure: PACEMAKER IMPLANT;  Surgeon: Constance Haw, MD;  Location: [place] CV LAB;  Service:  Cardiovascular;  Laterality: N/A;   THYROIDECTOMY  1968   THYROIDECTOMY, PARTIAL     left   TOTAL HIP ARTHROPLASTY Left 03/16/2020   Procedure: LEFT TOTAL HIP ARTHROPLASTY ANTERIOR APPROACH;  Surgeon: Mcarthur Rossetti, MD;  Location: [place];  Service: Orthopedics;  Laterality: Left;    Current Medications: Current Meds  Medication Sig   acetaminophen (TYLENOL) 325 MG tablet Take 1-2 tablets (325-650 mg total) by mouth every 6 (six) hours as needed for mild pain (pain score 1-3 or temp > 100.5).   amiodarone (PACERONE) 200 MG tablet Take 2 tablets by mouth for 7 days.  Then decrease to one tablet by mouth daily. (Patient taking differently: Take 2 tablets by mouth for 7 days.  Then decrease to one tablet by mouth daily.)   amLODipine (NORVASC) 2.5 MG tablet TAKE 1 TABLET BY MOUTH ONCE DAILY NEEDS  APPOINTMENT  WITH  CARDIOLOGIST   apixaban (ELIQUIS) 5 MG TABS tablet Take 1 tablet (5 mg total) by mouth 2 (two) times daily.   cholecalciferol (VITAMIN D3) 25 MCG (1000 UNIT) tablet Take 1,000 Units by mouth daily.   clopidogrel (PLAVIX) 75 MG tablet Take 1 tablet (75 mg total) by mouth daily.   ezetimibe (ZETIA) 10 MG tablet Take 1 tablet (10 mg total) by mouth daily.   famotidine (PEPCID) 10 MG tablet Take 10 mg by mouth daily as needed for heartburn or indigestion.   levothyroxine (SYNTHROID, LEVOTHROID) 50 MCG tablet Take 50 mcg by mouth daily before breakfast.   nitroGLYCERIN (NITROSTAT) 0.4 MG SL tablet Place 0.4 mg under the tongue every 5 (five) minutes as needed for chest pain.     Allergies:   Atorvastatin, Codeine, Crestor [rosuvastatin], Pravastatin, Simvastatin, and Ticagrelor   Social History   Socioeconomic History   Marital status: Widowed    Spouse name: Not on file   Number of children: Not on file   Years of education: Not on file   Highest education level: Not on file  Occupational History   Not on file  Tobacco Use   Smoking status: Former    Types: Cigarettes     Quit date: 06/06/1967    Years since quitting: 53.1   Smokeless tobacco: Former  Scientific laboratory technician Use: Never used  Substance and Sexual Activity   Alcohol use: No   Drug use: No   Sexual activity: Not Currently  Other Topics Concern   Not on file  Social History Narrative   Lives in [place].  Widowed.   Social Determinants of Health   Financial Resource Strain: Not on file  Food Insecurity: Not on file  Transportation Needs: Not on file  Physical Activity: Not on file  Stress: Not  on file  Social Connections: Not on file     Family History: The patient's family history includes Dementia in her mother and sister; Heart Problems in her father, paternal grandfather, and sister; Other in an other family member; Tuberculosis in her maternal grandmother. ROS:   Please see the history of present illness.    All other systems reviewed and are negative.  EKGs/Labs/Other Studies Reviewed:    The following studies were reviewed today:  EKG:  EKG ordered today and personally reviewed.  The ekg ordered today demonstrates atrial paced rhythm conduction intrinsically right bundle branch block morphology  Recent Labs: 08/20/2019: B Natriuretic Peptide 146.8 09/15/2019: Magnesium 2.0 03/17/2020: Hemoglobin 10.0; Platelets 183 05/18/2020: ALT 16; BUN 12; Creatinine, Ser 0.94; Potassium 4.4; Sodium 139; TSH 0.597  Recent Lipid Panel    Component Value Date/Time   CHOL 180 04/30/2015 0548   TRIG 249 (H) 04/30/2015 0548   HDL 27 (L) 04/30/2015 0548   CHOLHDL 6.7 04/30/2015 0548   VLDL 50 (H) 04/30/2015 0548   LDLCALC 103 (H) 04/30/2015 0548    Physical Exam:    VS:  BP 140/78   Pulse 76   Ht '4\' 11"'$  (1.499 m)   Wt 176 lb 6.4 oz (80 kg)   SpO2 98%   BMI 35.63 kg/m     Wt Readings from Last 3 Encounters:  07/29/20 176 lb 6.4 oz (80 kg)  05/18/20 176 lb 9.6 oz (80.1 kg)  03/16/20 176 lb (79.8 kg)     GEN:  Well nourished, well developed in no acute distress HEENT:  Normal NECK: No JVD; No carotid bruits LYMPHATICS: No lymphadenopathy CARDIAC: RRR, no murmurs, rubs, gallops RESPIRATORY:  Clear to auscultation without rales, wheezing or rhonchi  ABDOMEN: Soft, non-tender, non-distended MUSCULOSKELETAL:  No edema; No deformity  SKIN: Warm and dry NEUROLOGIC:  Alert and oriented x 3 PSYCHIATRIC:  Normal affect    Signed, Shirlee More, MD  07/29/2020 8:23 AM    [place]

## 2020-07-29 NOTE — Patient Instructions (Addendum)

## 2020-08-20 ENCOUNTER — Ambulatory Visit (INDEPENDENT_AMBULATORY_CARE_PROVIDER_SITE_OTHER): Payer: Medicare Other

## 2020-08-20 DIAGNOSIS — I480 Paroxysmal atrial fibrillation: Secondary | ICD-10-CM

## 2020-08-23 LAB — CUP PACEART REMOTE DEVICE CHECK
Battery Remaining Longevity: 155 mo
Battery Voltage: 3.08 V
Brady Statistic AP VP Percent: 0.07 %
Brady Statistic AP VS Percent: 96.54 %
Brady Statistic AS VP Percent: 0.01 %
Brady Statistic AS VS Percent: 3.38 %
Brady Statistic RA Percent Paced: 96.59 %
Brady Statistic RV Percent Paced: 0.08 %
Date Time Interrogation Session: 20220804233637
Implantable Lead Implant Date: 20210806
Implantable Lead Implant Date: 20210806
Implantable Lead Location: 753859
Implantable Lead Location: 753860
Implantable Lead Model: 5076
Implantable Lead Model: 5076
Implantable Pulse Generator Implant Date: 20210806
Lead Channel Impedance Value: 323 Ohm
Lead Channel Impedance Value: 437 Ohm
Lead Channel Impedance Value: 475 Ohm
Lead Channel Impedance Value: 513 Ohm
Lead Channel Pacing Threshold Amplitude: 0.625 V
Lead Channel Pacing Threshold Amplitude: 0.75 V
Lead Channel Pacing Threshold Pulse Width: 0.4 ms
Lead Channel Pacing Threshold Pulse Width: 0.4 ms
Lead Channel Sensing Intrinsic Amplitude: 12.125 mV
Lead Channel Sensing Intrinsic Amplitude: 12.125 mV
Lead Channel Sensing Intrinsic Amplitude: 2.625 mV
Lead Channel Sensing Intrinsic Amplitude: 2.625 mV
Lead Channel Setting Pacing Amplitude: 1.5 V
Lead Channel Setting Pacing Amplitude: 2 V
Lead Channel Setting Pacing Pulse Width: 0.4 ms
Lead Channel Setting Sensing Sensitivity: 1.2 mV

## 2020-09-09 NOTE — Progress Notes (Signed)
Remote pacemaker transmission.   

## 2020-09-22 ENCOUNTER — Telehealth: Payer: Self-pay | Admitting: Cardiology

## 2020-09-22 NOTE — Telephone Encounter (Signed)
STAT if HR is under 50 or over 120 (normal HR is 60-100 beats per minute)  What is your heart rate? 40  Do you have a log of your heart rate readings (document readings)? Fluctuates between 40-90 and went up to 100  Do you have any other symptoms? Chest tightness, difficulty breathing  Pt c/o of Chest Pain: STAT if CP now or developed within 24 hours  1. Are you having CP right now? Chest tightness   2. Are you experiencing any other symptoms (ex. SOB, nausea, vomiting, sweating)? Rapid breathing (not able to take a deep breath)  3. How long have you been experiencing CP? Since Monday  4. Is your CP continuous or coming and going? Comes and goes. Happened Monday and symptoms subsided. Started again this morning  5. Have you taken Nitroglycerin? Yes. Patient took 1 this morning  ?

## 2020-09-22 NOTE — Telephone Encounter (Signed)
Returned call to grandson.  Advised Pt not having afib, and with elevated BP and chest pressure would advise she go to ER for evaluation.  Per grandson, Pt feels better now, her color has returned and she is not complaining of anything.  He states he will continue to monitor her and if any changes will go to ER.

## 2020-09-22 NOTE — Telephone Encounter (Signed)
Call transferred to triage.  Spoke with Pt's grandson, who is with Pt at this time.  Per grandson-Pt has been dizzy, has mild to moderate chest pressure and short of breath.  Her BP is 190/80.  Heart rate is fluctuating between 40-100.  Per grandson, she took a nitro which brought her blood pressure down some.  Per grandson, Pt does have chest pressure and anxiety when she has afib.  Per shared decision making, will start with getting a pacemaker remote check to see what rhythm Pt is having.

## 2020-09-22 NOTE — Telephone Encounter (Signed)
Manual transmission received and reviewed.  Normal device function, presenting HR [id number] with PVCs.  No sustained arrhythmias.    Spoke with pt son, Annie Main advised of normal pacemaker function, no arrythmia.  Will forward info to Dr. Claudie Revering nurse for follow-up/ recommendation.

## 2020-11-02 ENCOUNTER — Ambulatory Visit: Payer: Self-pay

## 2020-11-02 ENCOUNTER — Encounter: Payer: Self-pay | Admitting: Orthopaedic Surgery

## 2020-11-02 ENCOUNTER — Ambulatory Visit: Payer: Medicare Other | Admitting: Orthopaedic Surgery

## 2020-11-02 DIAGNOSIS — M1711 Unilateral primary osteoarthritis, right knee: Secondary | ICD-10-CM | POA: Diagnosis not present

## 2020-11-02 DIAGNOSIS — Z96642 Presence of left artificial hip joint: Secondary | ICD-10-CM

## 2020-11-02 DIAGNOSIS — M1712 Unilateral primary osteoarthritis, left knee: Secondary | ICD-10-CM

## 2020-11-02 MED ORDER — LIDOCAINE HCL 1 % IJ SOLN
3.0000 mL | INTRAMUSCULAR | Status: AC | PRN
Start: 2020-11-02 — End: 2020-11-02
  Administered 2020-11-02: 3 mL

## 2020-11-02 MED ORDER — LIDOCAINE HCL 1 % IJ SOLN
3.0000 mL | INTRAMUSCULAR | Status: AC | PRN
  Administered 2020-11-02: 3 mL

## 2020-11-02 MED ORDER — METHYLPREDNISOLONE ACETATE 40 MG/ML IJ SUSP
40.0000 mg | INTRAMUSCULAR | Status: AC | PRN
  Administered 2020-11-02: 40 mg via INTRA_ARTICULAR

## 2020-11-02 NOTE — Progress Notes (Signed)
Office Visit Note   Patient: Heather [name]           Date of Birth: July 01, 1930           MRN: [id number] Visit Date: 11/02/2020              Requested by: Leonard Downing, MD [address] [place],  [place] [address] PCP: Leonard Downing, MD   Assessment & Plan: Visit Diagnoses:  1. History of left hip replacement   2. Primary osteoarthritis of left knee   3. Primary osteoarthritis of right knee     Plan: We will have her follow-up with [place] in 6 months to see how she is doing overall.  She understands she can have cortisone injections as often as every 3 months if need be.  We will obtain an AP pelvis lateral view of her left hip and return in 6 months.  Questions encouraged and answered at length by Dr. Ninfa Linden and myself.  Follow-Up Instructions: Return in about 6 months (around 05/03/2021) for Radiographs.   Orders:  Orders Placed This Encounter  Procedures   Large Joint Inj   XR HIP UNILAT W OR W/O PELVIS 1V LEFT   XR Knee 1-2 Views Right   XR Knee 1-2 Views Left   No orders of the defined types were placed in this encounter.     Procedures: Large Joint Inj: bilateral knee on 11/02/2020 10:21 AM Indications: pain Details: 22 G 1.5 in needle, superolateral approach  Arthrogram: No  Medications (Right): 3 mL lidocaine 1 %; 40 mg methylPREDNISolone acetate 40 MG/ML Medications (Left): 3 mL lidocaine 1 %; 40 mg methylPREDNISolone acetate 40 MG/ML Outcome: tolerated well, no immediate complications Procedure, treatment alternatives, risks and benefits explained, specific risks discussed. Consent was given by the patient. Immediately prior to procedure a time out was called to verify the correct patient, procedure, equipment, support staff and site/side marked as required. Patient was prepped and draped in the usual sterile fashion.      Clinical Data: No additional findings.   Subjective: Chief Complaint  Patient presents with   Left Hip -  Follow-up   Right Knee - Pain   Left Knee - Pain    HPI Mrs. Langreck returns today for follow-up left total hip arthroplasty 03/16/2020.  She is overall doing well in regards to her hip.  Main complaint today is her knees.  Right knee gave way on her a month ago and caused her to fall.  She had no treatment or work-up of her knees in the past.  Again she is on chronic anticoagulation lines of pacemaker.  Review of Systems See HPI otherwise negative  Objective: Vital Signs: There were no vitals taken for this visit.  Physical Exam General well-developed well-nourished female no acute distress ambulates with a Rollator Ortho Exam Bilateral knees she has pain with range of motion but overall good range of motion.  No gross instability.  Slight valgus deformity of both knees.  Tenderness lateral joint line of both knees.  Patellofemoral crepitus with passive range of motion right knee.  No abnormal warmth erythema or effusion of either knee. Specialty Comments:  No specialty comments available.  Imaging: XR HIP UNILAT W OR W/O PELVIS 1V LEFT  Result Date: 11/02/2020 AP pelvis lateral view left hip: Status post left total hip arthroplasty well-seated components.  No acute fractures.  Bilateral hips well located.  XR Knee 1-2 Views Left  Result Date: 11/02/2020 Left knee  2 views: No acute fracture.  Knee is well located.  Lateral compartment with near bone-on-bone.  Mild to moderate medial compartmental narrowing.  Patellofemoral changes moderate to moderately severe.  XR Knee 1-2 Views Right  Result Date: 11/02/2020 Right knee 2 views: No acute fractures.  Bone-on-bone lateral compartment.  Moderate to severe patellofemoral changes.  Medial compartment with changes consistent with mild to moderate arthritis.    PMFS History: Patient Active Problem List   Diagnosis Date Noted   Pneumonia    History of kidney stones    Diverticulitis    Arthritis of left hip 03/16/2020   Status  post total replacement of left hip 03/16/2020   Hypertension    GERD (gastroesophageal reflux disease)    Coronary artery disease    Chronic diastolic CHF (congestive heart failure) ([place])    Arthritis    Pacemaker 09/15/2019   Presence of permanent cardiac pacemaker 08/22/2019   Sinus node dysfunction (HCC)    Chest pain 08/20/2019   Elevated troponin    Dyspnea 01/03/2016   Anxiety attack 01/03/2016   PAF (paroxysmal atrial fibrillation) ([place]) 01/03/2016   Aphasia    Other hyperlipidemia    NSTEMI (non-ST elevated myocardial infarction) ([place]) 04/30/2015   Hypertension, uncontrolled 07/19/2014   H/O mitral valve repair    CAD (coronary artery disease) of artery bypass graft, s/p PCI stent to VG->RCA 04/30/15 07/14/2013   Obesity (BMI 30-39.9) 07/14/2013   Hyperlipidemia 07/14/2013   Hypothyroidism 07/14/2013   Hypertensive heart disease 07/14/2013   CAD (coronary artery disease), native coronary artery 07/14/2013   S/P partial hysterectomy 1977   Past Medical History:  Diagnosis Date   Anxiety attack 01/03/2016   Aphasia 01/03/2016   Arthritis    CAD (coronary artery disease) of artery bypass graft, s/p PCI stent to VG->RCA 04/30/15 07/14/2013   Catheterization August/2003 showing severe three-vessel disease and moderate mitral regurgitation 08/28/2001 CABG with internal mammary graft to LAD, vein graft to circumflex, and vein graft to posterolateral branch and mitral valve repair by Dr. 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 Patent internal ma   CAD (coronary artery disease), native coronary artery 07/14/2013   Catheterization August/2003 showing severe three-vessel disease and moderate mitral regurgitation 08/28/2001 CABG with internal mammary graft to LAD, vein graft to circumflex, and vein graft to posterolateral branch and mitral valve repair by Dr. Roxy Manns Inferior infarction 07/12/13 with  occlusion of the vein graft to right coronary artery, stenting with a 3.518 mm resolute stents. Patent internal mammary graft with competitive flow in the distal vessel, occluded right coronary artery, occluded circumflex, patent saphenous vein graft to circumflex with mild/moderate disease proximally, minimal LV damage    Chest pain 08/20/2019   Chronic diastolic CHF (congestive heart failure) ([place])    a. EF 60% by LV gram 01/2009.   Coronary artery disease    a. s/p MI in 1980;  b. 2003 CABG x 3 (LIMA->LAD, VG->OM, VG->RPL);  c. 01/2009 Cath: 3/3 patent grafts, native 3VD, EF 60%.   Diverticulitis    Dyspnea 01/03/2016   Elevated troponin    GERD (gastroesophageal reflux disease)    H/O mitral valve repair    a. 2003   History of kidney stones    Hyperlipidemia 07/14/2013   Intolerance to multiple statins including Crestor, pravastatin, and Lipitor    Hypertension    Hypertension, uncontrolled 07/19/2014   Hypertensive heart disease 07/14/2013  Hypothyroidism    NSTEMI (non-ST elevated myocardial infarction) ([place])    Other hyperlipidemia    PAF (paroxysmal atrial fibrillation) (HCC)    Pneumonia    Presence of permanent cardiac pacemaker 08/22/2019   S/P partial hysterectomy 1977   Sinus node dysfunction (HCC)     Family History  Problem Relation Age of Onset   Dementia Mother    Heart Problems Father    Dementia Sister    Heart Problems Sister    Other Other        no premature CAD.   Tuberculosis Maternal Grandmother    Heart Problems Paternal Grandfather     Past Surgical History:  Procedure Laterality Date   ABDOMINAL HYSTERECTOMY  1977   APPENDECTOMY  1950   BLADDER SUSPENSION     tac   CARDIAC CATHETERIZATION  03,05,11   CARDIAC CATHETERIZATION N/A 04/30/2015   Procedure: Left Heart Cath and Cors/Grafts Angiography;  Surgeon: Belva Crome, MD;  Location: [facility];  Service: Cardiovascular;  Laterality: N/A;   CARDIAC CATHETERIZATION N/A 04/30/2015    Procedure: Coronary Stent Intervention;  Surgeon: Belva Crome, MD;  Location: [facility];  Service: Cardiovascular;  Laterality: N/A;  Proximal and Distal SVG to the PLA   CARDIAC CATHETERIZATION N/A 01/06/2016   Procedure: Left Heart Cath and Cors/Grafts Angiography;  Surgeon: Jettie Booze, MD;  Location: [facility];  Service: Cardiovascular;  Laterality: N/A;   CARPAL TUNNEL RELEASE     right and left   CHOLECYSTECTOMY     CORONARY ARTERY BYPASS GRAFT  2003   KNEE ARTHROSCOPY     left   LEFT HEART CATHETERIZATION WITH CORONARY ANGIOGRAM N/A 07/12/2013   Procedure: LEFT HEART CATHETERIZATION WITH CORONARY ANGIOGRAM;  Surgeon: Jettie Booze, MD;  Location: [facility];  Service: Cardiovascular;  Laterality: N/A;   LEFT TOTAL HIP ARTHROPLASTY ANTERIOR APPROACH (Left Hip)  03/16/2020   MITRAL VALVE REPAIR  2003   with cabg   PACEMAKER IMPLANT N/A 08/22/2019   Procedure: PACEMAKER IMPLANT;  Surgeon: Constance Haw, MD;  Location: [facility];  Service: Cardiovascular;  Laterality: N/A;   THYROIDECTOMY  1968   THYROIDECTOMY, PARTIAL     left   TOTAL HIP ARTHROPLASTY Left 03/16/2020   Procedure: LEFT TOTAL HIP ARTHROPLASTY ANTERIOR APPROACH;  Surgeon: Mcarthur Rossetti, MD;  Location: [facility];  Service: Orthopedics;  Laterality: Left;   Social History   Occupational History   Not on file  Tobacco Use   Smoking status: Former    Types: Cigarettes    Quit date: 06/06/1967    Years since quitting: 53.4   Smokeless tobacco: Former  Scientific laboratory technician Use: Never used  Substance and Sexual Activity   Alcohol use: No   Drug use: No   Sexual activity: Not Currently

## 2020-11-12 ENCOUNTER — Inpatient Hospital Stay (HOSPITAL_COMMUNITY)
Admission: EM | Admit: 2020-11-12 | Discharge: 2020-11-18 | DRG: 291 | Disposition: A | Discharge status: Home / Self Care | Payer: Medicare Other | Source: Ambulatory Visit | Attending: Internal Medicine | Admitting: Internal Medicine

## 2020-11-12 ENCOUNTER — Emergency Department (HOSPITAL_COMMUNITY): Payer: Medicare Other

## 2020-11-12 ENCOUNTER — Encounter (HOSPITAL_COMMUNITY): Payer: Self-pay

## 2020-11-12 DIAGNOSIS — Z96642 Presence of left artificial hip joint: Secondary | ICD-10-CM | POA: Diagnosis present

## 2020-11-12 DIAGNOSIS — Z8249 Family history of ischemic heart disease and other diseases of the circulatory system: Secondary | ICD-10-CM

## 2020-11-12 DIAGNOSIS — I050 Rheumatic mitral stenosis: Secondary | ICD-10-CM | POA: Diagnosis present

## 2020-11-12 DIAGNOSIS — R9431 Abnormal electrocardiogram [ECG] [EKG]: Secondary | ICD-10-CM

## 2020-11-12 DIAGNOSIS — I482 Chronic atrial fibrillation, unspecified: Secondary | ICD-10-CM

## 2020-11-12 DIAGNOSIS — K224 Dyskinesia of esophagus: Secondary | ICD-10-CM | POA: Diagnosis present

## 2020-11-12 DIAGNOSIS — R059 Cough, unspecified: Secondary | ICD-10-CM

## 2020-11-12 DIAGNOSIS — Z888 Allergy status to other drugs, medicaments and biological substances status: Secondary | ICD-10-CM

## 2020-11-12 DIAGNOSIS — R1310 Dysphagia, unspecified: Secondary | ICD-10-CM

## 2020-11-12 DIAGNOSIS — R0600 Dyspnea, unspecified: Secondary | ICD-10-CM

## 2020-11-12 DIAGNOSIS — I480 Paroxysmal atrial fibrillation: Secondary | ICD-10-CM | POA: Diagnosis present

## 2020-11-12 DIAGNOSIS — I25810 Atherosclerosis of coronary artery bypass graft(s) without angina pectoris: Secondary | ICD-10-CM | POA: Diagnosis present

## 2020-11-12 DIAGNOSIS — I495 Sick sinus syndrome: Secondary | ICD-10-CM | POA: Diagnosis present

## 2020-11-12 DIAGNOSIS — Z66 Do not resuscitate: Secondary | ICD-10-CM | POA: Diagnosis present

## 2020-11-12 DIAGNOSIS — E669 Obesity, unspecified: Secondary | ICD-10-CM | POA: Diagnosis present

## 2020-11-12 DIAGNOSIS — Z79899 Other long term (current) drug therapy: Secondary | ICD-10-CM

## 2020-11-12 DIAGNOSIS — K766 Portal hypertension: Secondary | ICD-10-CM | POA: Diagnosis present

## 2020-11-12 DIAGNOSIS — Z7901 Long term (current) use of anticoagulants: Secondary | ICD-10-CM

## 2020-11-12 DIAGNOSIS — Z885 Allergy status to narcotic agent status: Secondary | ICD-10-CM

## 2020-11-12 DIAGNOSIS — E785 Hyperlipidemia, unspecified: Secondary | ICD-10-CM | POA: Diagnosis present

## 2020-11-12 DIAGNOSIS — R7989 Other specified abnormal findings of blood chemistry: Secondary | ICD-10-CM | POA: Diagnosis not present

## 2020-11-12 DIAGNOSIS — K219 Gastro-esophageal reflux disease without esophagitis: Secondary | ICD-10-CM | POA: Diagnosis not present

## 2020-11-12 DIAGNOSIS — R1314 Dysphagia, pharyngoesophageal phase: Secondary | ICD-10-CM | POA: Diagnosis present

## 2020-11-12 DIAGNOSIS — Z7902 Long term (current) use of antithrombotics/antiplatelets: Secondary | ICD-10-CM

## 2020-11-12 DIAGNOSIS — I5033 Acute on chronic diastolic (congestive) heart failure: Secondary | ICD-10-CM | POA: Diagnosis present

## 2020-11-12 DIAGNOSIS — Z8616 Personal history of COVID-19: Secondary | ICD-10-CM

## 2020-11-12 DIAGNOSIS — I119 Hypertensive heart disease without heart failure: Secondary | ICD-10-CM | POA: Diagnosis present

## 2020-11-12 DIAGNOSIS — R0602 Shortness of breath: Secondary | ICD-10-CM | POA: Diagnosis not present

## 2020-11-12 DIAGNOSIS — E890 Postprocedural hypothyroidism: Secondary | ICD-10-CM | POA: Diagnosis present

## 2020-11-12 DIAGNOSIS — Z6835 Body mass index (BMI) 35.0-35.9, adult: Secondary | ICD-10-CM

## 2020-11-12 DIAGNOSIS — Z79890 Hormone replacement therapy (postmenopausal): Secondary | ICD-10-CM

## 2020-11-12 DIAGNOSIS — E782 Mixed hyperlipidemia: Secondary | ICD-10-CM

## 2020-11-12 DIAGNOSIS — E871 Hypo-osmolality and hyponatremia: Secondary | ICD-10-CM | POA: Diagnosis present

## 2020-11-12 DIAGNOSIS — Z955 Presence of coronary angioplasty implant and graft: Secondary | ICD-10-CM

## 2020-11-12 DIAGNOSIS — Z87891 Personal history of nicotine dependence: Secondary | ICD-10-CM

## 2020-11-12 DIAGNOSIS — I110 Hypertensive heart disease with heart failure: Secondary | ICD-10-CM | POA: Diagnosis not present

## 2020-11-12 DIAGNOSIS — I252 Old myocardial infarction: Secondary | ICD-10-CM

## 2020-11-12 DIAGNOSIS — Z950 Presence of cardiac pacemaker: Secondary | ICD-10-CM

## 2020-11-12 DIAGNOSIS — E039 Hypothyroidism, unspecified: Secondary | ICD-10-CM | POA: Diagnosis present

## 2020-11-12 LAB — TROPONIN I (HIGH SENSITIVITY)
Troponin I (High Sensitivity): 13 ng/L (ref <18)
Troponin I (High Sensitivity): 14 ng/L (ref <18)

## 2020-11-12 LAB — COMPREHENSIVE METABOLIC PANEL
ALT: 26 U/L (ref 0–44)
AST: 25 U/L (ref 15–41)
Albumin: 4.5 g/dL (ref 3.5–5.0)
Alkaline Phosphatase: 59 U/L (ref 38–126)
Anion gap: 11 (ref 5–15)
BUN: 16 mg/dL (ref 8–23)
CO2: 25 mmol/L (ref 22–32)
Calcium: 10 mg/dL (ref 8.9–10.3)
Chloride: 103 mmol/L (ref 98–111)
Creatinine, Ser: 0.96 mg/dL (ref 0.44–1.00)
GFR, Estimated: 57 mL/min — ABNORMAL LOW (ref >60)
Glucose, Bld: 101 mg/dL — ABNORMAL HIGH (ref 70–99)
Potassium: 4 mmol/L (ref 3.5–5.1)
Sodium: 139 mmol/L (ref 135–145)
Total Bilirubin: 1.1 mg/dL (ref 0.3–1.2)
Total Protein: 7.4 g/dL (ref 6.5–8.1)

## 2020-11-12 LAB — CBC
HCT: 40.3 % (ref 36.0–46.0)
Hemoglobin: 13.2 g/dL (ref 12.0–15.0)
MCH: 30.2 pg (ref 26.0–34.0)
MCHC: 32.8 g/dL (ref 30.0–36.0)
MCV: 92.2 fL (ref 80.0–100.0)
Platelets: 229 10*3/uL (ref 150–400)
RBC: 4.37 MIL/uL (ref 3.87–5.11)
RDW: 13.9 % (ref 11.5–15.5)
WBC: 8.3 10*3/uL (ref 4.0–10.5)
nRBC: 0 % (ref 0.0–0.2)

## 2020-11-12 LAB — BRAIN NATRIURETIC PEPTIDE: B Natriuretic Peptide: 134.9 pg/mL — ABNORMAL HIGH (ref 0.0–100.0)

## 2020-11-12 MED ORDER — ALBUTEROL SULFATE (2.5 MG/3ML) 0.083% IN NEBU
5.0000 mg | INHALATION_SOLUTION | Freq: Once | RESPIRATORY_TRACT | Status: AC
  Administered 2020-11-12: 5 mg via RESPIRATORY_TRACT
  Filled 2020-11-12: qty 6

## 2020-11-12 MED ORDER — IPRATROPIUM BROMIDE 0.02 % IN SOLN
0.5000 mg | Freq: Once | RESPIRATORY_TRACT | Status: AC
  Administered 2020-11-12: 0.5 mg via RESPIRATORY_TRACT
  Filled 2020-11-12: qty 2.5

## 2020-11-12 NOTE — H&P (Signed)
History and Physical  Heather [name] ZLD:[id number] DOB: 1930/09/07 DOA: 11/12/2020  Referring physician: Davonna Belling, MD PCP: Leonard Downing, MD  Patient coming from: Home  Chief Complaint: Shortness of breath  HPI: Heather [name] is a 87 y.o. female with medical history significant for multivessel CAD s/p CABG and PCI , HTN, history of mitral valve repair, hypothyroidism, chronic diastolic CHF, GERD, hyperlipidemia, A. fib on Eliquis  who presents to the emergency department due to worsening shortness of breath.  Patient states that she had COVID in July and that since she recovered, she has  always some cough and slight increase work of breathing (different from baseline prior to [place]).  She states that she started having worsening shortness of breath within last few days and has not been able to lay flat due to difficulty in breathing in such position.  She complained of increased leg swelling bilaterally, however, there was not much difference from baseline increased leg swelling.  She went to her PCP and she was asked to go to the ED for further evaluation and management.  She denies fever, chills, chest pain, nausea, vomiting or abdominal pain  ED Course:  In the emergency department, she was tachypneic, but otherwise hemodynamically stable.  Work-up in the ED showed normal CBC and BMP, BNP 134.9, troponin x2 was negative.  Influenza A, B, SARS coronavirus 2 was negative. Chest x-ray showed cardiomegaly without edema or infiltrate Breathing treatment was provided.  Hospitalist was asked to admit patient for further evaluation and management.  Review of Systems: Constitutional: Negative for chills and fever.  HENT: Negative for ear pain and sore throat.   Eyes: Negative for pain and visual disturbance.  Respiratory: Positive for shortness of breath, cough and wheezing.   Cardiovascular: Negative for chest pain and palpitations.  Gastrointestinal: Negative for abdominal  pain and vomiting.  Endocrine: Negative for polyphagia and polyuria.  Genitourinary: Negative for decreased urine volume, dysuria, enuresis Musculoskeletal: Positive for leg swelling.  Negative for arthralgias and back pain.  Skin: Negative for color change and rash.  Allergic/Immunologic: Negative for immunocompromised state.  Neurological: Negative for tremors, syncope, speech difficulty, weakness, light-headedness and headaches.  Hematological: Does not bruise/bleed easily.  All other systems reviewed and are negative  Past Medical History:  Diagnosis Date   Anxiety attack 01/03/2016   Aphasia 01/03/2016   Arthritis    CAD (coronary artery disease) of artery bypass graft, s/p PCI stent to VG->RCA 04/30/15 07/14/2013   Catheterization August/2003 showing severe three-vessel disease and moderate mitral regurgitation 08/28/2001 CABG with internal mammary graft to LAD, vein graft to circumflex, and vein graft to posterolateral branch and mitral valve repair by Dr. 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 Patent internal ma   CAD (coronary artery disease), native coronary artery 07/14/2013   Catheterization August/2003 showing severe three-vessel disease and moderate mitral regurgitation 08/28/2001 CABG with internal mammary graft to LAD, vein graft to circumflex, and vein graft to posterolateral branch and mitral valve repair by Dr. 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 Patent internal mammary graft with competitive flow in the distal vessel, occluded right coronary artery, occluded circumflex, patent saphenous vein graft to circumflex with mild/moderate disease proximally, minimal LV damage    Chest pain 08/20/2019   Chronic diastolic CHF (congestive heart failure) ([place])  a. EF 60% by LV gram 01/2009.   Coronary  artery disease    a. s/p MI in 1980;  b. 2003 CABG x 3 (LIMA->LAD, VG->OM, VG->RPL);  c. 01/2009 Cath: 3/3 patent grafts, native 3VD, EF 60%.   Diverticulitis    Dyspnea 01/03/2016   Elevated troponin    GERD (gastroesophageal reflux disease)    H/O mitral valve repair    a. 2003   History of kidney stones    Hyperlipidemia 07/14/2013   Intolerance to multiple statins including Crestor, pravastatin, and Lipitor    Hypertension    Hypertension, uncontrolled 07/19/2014   Hypertensive heart disease 07/14/2013   Hypothyroidism    NSTEMI (non-ST elevated myocardial infarction) ([place])    Other hyperlipidemia    PAF (paroxysmal atrial fibrillation) ([place])    Pneumonia    Presence of permanent cardiac pacemaker 08/22/2019   S/P partial hysterectomy 1977   Sinus node dysfunction [facility])    Past Surgical History:  Procedure Laterality Date   ABDOMINAL HYSTERECTOMY  1977   APPENDECTOMY  1950   BLADDER SUSPENSION     tac   CARDIAC CATHETERIZATION  03,05,11   CARDIAC CATHETERIZATION N/A 04/30/2015   Procedure: Left Heart Cath and Cors/Grafts Angiography;  Surgeon: Belva Crome, MD;  Location: [facility];  Service: Cardiovascular;  Laterality: N/A;   CARDIAC CATHETERIZATION N/A 04/30/2015   Procedure: Coronary Stent Intervention;  Surgeon: Belva Crome, MD;  Location: [facility];  Service: Cardiovascular;  Laterality: N/A;  Proximal and Distal SVG to the PLA   CARDIAC CATHETERIZATION N/A 01/06/2016   Procedure: Left Heart Cath and Cors/Grafts Angiography;  Surgeon: Jettie Booze, MD;  Location: [facility] [facility];  Service: Cardiovascular;  Laterality: N/A;   CARPAL TUNNEL RELEASE     right and left   CHOLECYSTECTOMY     CORONARY ARTERY BYPASS GRAFT  2003   KNEE ARTHROSCOPY     left   LEFT HEART CATHETERIZATION WITH CORONARY ANGIOGRAM N/A 07/12/2013   Procedure: LEFT HEART CATHETERIZATION WITH CORONARY ANGIOGRAM;  Surgeon: Jettie Booze, MD;  Location: [facility] CATH [facility];   Service: Cardiovascular;  Laterality: N/A;   LEFT TOTAL HIP ARTHROPLASTY ANTERIOR APPROACH (Left Hip)  03/16/2020   MITRAL VALVE REPAIR  2003   with cabg   PACEMAKER IMPLANT N/A 08/22/2019   Procedure: PACEMAKER IMPLANT;  Surgeon: Constance Haw, MD;  Location: [facility];  Service: Cardiovascular;  Laterality: N/A;   THYROIDECTOMY  1968   THYROIDECTOMY, PARTIAL     left   TOTAL HIP ARTHROPLASTY Left 03/16/2020   Procedure: LEFT TOTAL HIP ARTHROPLASTY ANTERIOR APPROACH;  Surgeon: Mcarthur Rossetti, MD;  Location: [place] [facility];  Service: Orthopedics;  Laterality: Left;    Social History:  reports that she quit smoking about 53 years ago. She has quit using smokeless tobacco. She reports that she does not drink alcohol and does not use drugs.   Allergies  Allergen Reactions   Atorvastatin     Muscle ache   Codeine Nausea And Vomiting   Crestor [Rosuvastatin]     Muscle ache   Pravastatin     Muscle ache   Simvastatin     Muscle cramps    Ticagrelor     Dyspnea     Family History  Problem Relation Age of Onset   Dementia Mother    Heart Problems Father    Dementia Sister    Heart Problems Sister    Other Other  no premature CAD.   Tuberculosis Maternal Grandmother    Heart Problems Paternal Grandfather      Prior to Admission medications   Medication Sig Start Date End Date Taking? Authorizing Provider  acetaminophen (TYLENOL) 325 MG tablet Take 1-2 tablets (325-650 mg total) by mouth every 6 (six) hours as needed for mild pain (pain score 1-3 or temp > 100.5). 03/19/20   Mcarthur Rossetti, MD  amiodarone (PACERONE) 200 MG tablet Take 2 tablets by mouth for 7 days.  Then decrease to one tablet by mouth daily. Patient taking differently: Take 2 tablets by mouth for 7 days.  Then decrease to one tablet by mouth daily. 10/22/19   Vickie Epley, MD  amLODipine (NORVASC) 2.5 MG tablet TAKE 1 TABLET BY MOUTH ONCE DAILY NEEDS  APPOINTMENT  WITH   CARDIOLOGIST 06/03/18   Buford Dresser, MD  apixaban (ELIQUIS) 5 MG TABS tablet Take 1 tablet (5 mg total) by mouth 2 (two) times daily. 10/16/19 07/29/20  Vickie Epley, MD  cholecalciferol (VITAMIN D3) 25 MCG (1000 UNIT) tablet Take 1,000 Units by mouth daily.    [provider]  clopidogrel (PLAVIX) 75 MG tablet Take 1 tablet (75 mg total) by mouth daily. 07/17/13   Jacolyn Reedy, MD  ezetimibe (ZETIA) 10 MG tablet Take 1 tablet (10 mg total) by mouth daily. 10/17/19 07/29/20  Richardo Priest, MD  famotidine (PEPCID) 10 MG tablet Take 10 mg by mouth daily as needed for heartburn or indigestion.    [provider]  levothyroxine (SYNTHROID, LEVOTHROID) 50 MCG tablet Take 50 mcg by mouth daily before breakfast.    [provider]  nitroGLYCERIN (NITROSTAT) 0.4 MG SL tablet Place 0.4 mg under the tongue every 5 (five) minutes as needed for chest pain.    [provider]    Physical Exam: BP (!) 160/73   Pulse 64   Temp 98.4 F (36.9 C) (Oral)   Resp 18   Ht 4\' 11"  (1.499 m)   Wt 80.7 kg   SpO2 98%   BMI 35.95 kg/m   General: 87 y.o. year-old female well developed well nourished in no acute distress.  Alert and oriented x3. HEENT: NCAT, EOMI Neck: Supple, trachea medial Cardiovascular: Pacemaker site noted.  Regular rate and rhythm with no rubs or gallops.  No thyromegaly or JVD noted.  2/4 pulses in all 4 extremities. Respiratory: Diffuse wheezing and bilateral rales in lower lobes.  Abdomen: Soft, nontender nondistended with normal bowel sounds x4 quadrants. Muskuloskeletal: Trace edema bilaterally in lower extremities.  No cyanosis or clubbing  Neuro: CN II-XII intact, strength 5/5 x 4, sensation, reflexes intact Skin: No ulcerative lesions noted or rashes Psychiatry: Judgement and insight appear normal. Mood is appropriate for condition and setting          Labs on Admission:  Basic Metabolic Panel: Recent Labs  Lab 11/12/20 1737   NA 139  K 4.0  CL 103  CO2 25  GLUCOSE 101*  BUN 16  CREATININE 0.96  CALCIUM 10.0   Liver Function Tests: Recent Labs  Lab 11/12/20 1737  AST 25  ALT 26  ALKPHOS 59  BILITOT 1.1  PROT 7.4  ALBUMIN 4.5   No results for input(s): LIPASE, AMYLASE in the last 168 hours. No results for input(s): AMMONIA in the last 168 hours. CBC: Recent Labs  Lab 11/12/20 1737  WBC 8.3  HGB 13.2  HCT 40.3  MCV 92.2  PLT 229   Cardiac  Enzymes: No results for input(s): CKTOTAL, CKMB, CKMBINDEX, TROPONINI in the last 168 hours.  BNP (last 3 results) Recent Labs    11/12/20 1737  BNP 134.9*    ProBNP (last 3 results) No results for input(s): PROBNP in the last 8760 hours.  CBG: No results for input(s): GLUCAP in the last 168 hours.  Radiological Exams on Admission: DG Chest Portable 1 View  Result Date: 11/12/2020 CLINICAL DATA:  Shortness of breath EXAM: PORTABLE CHEST 1 VIEW COMPARISON:  08/23/2019, 08/20/2019 FINDINGS: Post sternotomy changes and left-sided pacing device as before. Cardiomegaly. No pleural effusion, pneumothorax or focal airspace disease. Aortic atherosclerosis. IMPRESSION: Cardiomegaly without edema or infiltrate Electronically Signed   By: Donavan Foil M.D.   On: 11/12/2020 18:06    EKG: I independently viewed the EKG done and my findings are as followed: Normal sinus/ectopic atrial rhythm with QTc (527 ms)  Assessment/Plan Present on Admission:  Shortness of breath  Obesity (BMI 30-39.9)  Hypothyroidism  Hyperlipidemia  Hypertensive heart disease  CAD (coronary artery disease) of artery bypass graft, s/p PCI stent to VG->RCA 04/30/15  GERD (gastroesophageal reflux disease)  Principal Problem:   Shortness of breath Active Problems:   CAD (coronary artery disease) of artery bypass graft, s/p PCI stent to VG->RCA 04/30/15   Obesity (BMI 30-39.9)   Hyperlipidemia   Hypothyroidism   Hypertensive heart disease   GERD (gastroesophageal reflux  disease)   Elevated brain natriuretic peptide (BNP) level   Prolonged QT interval   Atrial fibrillation, chronic (HCC)   Shortness of breath possibly secondary to undiagnosed asthma/COPD and/or CHF Patient presents with diffuse expiratory wheezing and rales on auscultation Breathing treatment was provided in the ED Continue duo nebs, Mucinex, Solu-Medrol. Continue Protonix to prevent steroid-induced ulcer Continue incentive spirometry and flutter valve Continue supplemental oxygen to maintain O2 sat > 92% with plan to wean patient off oxygen as tolerated Patient will need outpatient PFT to rule out/in COPD  Elevated BNP rule out CHF BNP 134.9 Continue total input/output, daily weights and fluid restriction Continue Cardiac diet  Echocardiogram done in August 2021 showed LVEF of 55 to 60%.  LV demonstrates RWMA.    Prolonged QTc (527 ms) This may not be reliable considering the patient has a pacemaker Avoid QT prolonging drugs Magnesium level will be checked  Essential hypertension Continue amlodipine  Mixed hyperlipidemia Continue Zetia  Acquired hypothyroidism Continue Synthroid  CAD s/p CABG, PCI Continue Zetia, nitroglycerin as needed  GERD Continue Pepcid  Chronic atrial fibrillation Amiodarone temporarily held due to prolonged QTc Continue Eliquis  Obesity (BMI 35.95) Patient will be counseled on diet and lifestyle modification when more stable   DVT prophylaxis: Eliquis  Code Status: Full code  Family Communication: Husband at bedside (all questions answered to satisfaction)  Disposition Plan:  Patient is from:                        home Anticipated DC to:                   SNF or family members home Anticipated DC date:               2-3 days Anticipated DC barriers:         Patient requires inpatient management due to worsening shortness of breath requiring intensive breathing treatment  Consults called: None  Admission status:  Observation    Bernadette Hoit MD [facility]  11/13/2020, 3:16 AM

## 2020-11-12 NOTE — ED Provider Notes (Signed)
[facility] EMERGENC[facility] DEPARTMENT Provider Note   CSN: [id number] Arrival date & time: 11/12/20  1633     History Chief Complaint  Patient presents with   Shortness of Breath    Heather [name] is a 87 y.o. female.   Shortness of Breath Associated symptoms: cough and wheezing   Associated symptoms: no chest pain   Patient presents with shortness of breath.  Has had for the last few days.  States she is having more trouble laying down.  Had a little bit of cough.  States is been having episodes somewhat since having COVID back in July.  Has some swelling in her legs.  No fevers.  No chest pain.  States she cannot sleep laying down flat anymore.  No fevers.    Past Medical History:  Diagnosis Date   Anxiety attack 01/03/2016   Aphasia 01/03/2016   Arthritis    CAD (coronary artery disease) of artery bypass graft, s/p PCI stent to VG->RCA 04/30/15 07/14/2013   Catheterization August/2003 showing severe three-vessel disease and moderate mitral regurgitation 08/28/2001 CABG with internal mammary graft to LAD, vein graft to circumflex, and vein graft to posterolateral branch and mitral valve repair by Dr. 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 Patent internal ma   CAD (coronary artery disease), native coronary artery 07/14/2013   Catheterization August/2003 showing severe three-vessel disease and moderate mitral regurgitation 08/28/2001 CABG with internal mammary graft to LAD, vein graft to circumflex, and vein graft to posterolateral branch and mitral valve repair by Dr. 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 Patent internal mammary graft with competitive flow in the distal vessel, occluded right coronary artery, occluded circumflex, patent saphenous vein graft to circumflex with  mild/moderate disease proximally, minimal LV damage    Chest pain 08/20/2019   Chronic diastolic CHF (congestive heart failure) ([place])    a. EF 60% by LV gram 01/2009.   Coronary artery disease    a. s/p MI in 1980;  b. 2003 CABG x 3 (LIMA->LAD, VG->OM, VG->RPL);  c. 01/2009 Cath: 3/3 patent grafts, native 3VD, EF 60%.   Diverticulitis    Dyspnea 01/03/2016   Elevated troponin    GERD (gastroesophageal reflux disease)    H/O mitral valve repair    a. 2003   History of kidney stones    Hyperlipidemia 07/14/2013   Intolerance to multiple statins including Crestor, pravastatin, and Lipitor    Hypertension    Hypertension, uncontrolled 07/19/2014   Hypertensive heart disease 07/14/2013   Hypothyroidism    NSTEMI (non-ST elevated myocardial infarction) ([place])    Other hyperlipidemia    PAF (paroxysmal atrial fibrillation) ([place])    Pneumonia    Presence of permanent cardiac pacemaker 08/22/2019   S/P partial hysterectomy 1977   Sinus node dysfunction [facility])     Patient Active Problem List   Diagnosis Date Noted   Pneumonia    History of kidney stones    Diverticulitis    Arthritis of left hip 03/16/2020   Status post total replacement of left hip 03/16/2020   Hypertension    GERD (gastroesophageal reflux disease)    Coronary artery disease    Chronic diastolic CHF (congestive heart failure) ([place])    Arthritis    Pacemaker 09/15/2019   Presence of permanent cardiac pacemaker 08/22/2019  Sinus node dysfunction ([facility])    Chest pain 08/20/2019   Elevated troponin    Dyspnea 01/03/2016   Anxiety attack 01/03/2016   PAF (paroxysmal atrial fibrillation) ([place]) 01/03/2016   Aphasia    Other hyperlipidemia    NSTEMI (non-ST elevated myocardial infarction) ([place]) 04/30/2015   Hypertension, uncontrolled 07/19/2014   H/O mitral valve repair    CAD (coronary artery disease) of artery bypass graft, s/p PCI stent to VG->RCA 04/30/15 07/14/2013   Obesity (BMI 30-39.9) 07/14/2013   Hyperlipidemia  07/14/2013   Hypothyroidism 07/14/2013   Hypertensive heart disease 07/14/2013   CAD (coronary artery disease), native coronary artery 07/14/2013   S/P partial hysterectomy 1977    Past Surgical History:  Procedure Laterality Date   ABDOMINAL HYSTERECTOMY  1977   APPENDECTOMY  1950   BLADDER SUSPENSION     tac   CARDIAC CATHETERIZATION  03,05,11   CARDIAC CATHETERIZATION N/A 04/30/2015   Procedure: Left Heart Cath and Cors/Grafts Angiography;  Surgeon: Belva Crome, MD;  Location: [facility];  Service: Cardiovascular;  Laterality: N/A;   CARDIAC CATHETERIZATION N/A 04/30/2015   Procedure: Coronary Stent Intervention;  Surgeon: Belva Crome, MD;  Location: [facility];  Service: Cardiovascular;  Laterality: N/A;  Proximal and Distal SVG to the PLA   CARDIAC CATHETERIZATION N/A 01/06/2016   Procedure: Left Heart Cath and Cors/Grafts Angiography;  Surgeon: Jettie Booze, MD;  Location: [facility];  Service: Cardiovascular;  Laterality: N/A;   CARPAL TUNNEL RELEASE     right and left   CHOLECYSTECTOMY     CORONARY ARTERY BYPASS GRAFT  2003   KNEE ARTHROSCOPY     left   LEFT HEART CATHETERIZATION WITH CORONARY ANGIOGRAM N/A 07/12/2013   Procedure: LEFT HEART CATHETERIZATION WITH CORONARY ANGIOGRAM;  Surgeon: Jettie Booze, MD;  Location: [facility] CATH LAB;  Service: Cardiovascular;  Laterality: N/A;   LEFT TOTAL HIP ARTHROPLASTY ANTERIOR APPROACH (Left Hip)  03/16/2020   MITRAL VALVE REPAIR  2003   with cabg   PACEMAKER IMPLANT N/A 08/22/2019   Procedure: PACEMAKER IMPLANT;  Surgeon: Constance Haw, MD;  Location: [facility];  Service: Cardiovascular;  Laterality: N/A;   THYROIDECTOMY  1968   THYROIDECTOMY, PARTIAL     left   TOTAL HIP ARTHROPLASTY Left 03/16/2020   Procedure: LEFT TOTAL HIP ARTHROPLASTY ANTERIOR APPROACH;  Surgeon: Mcarthur Rossetti, MD;  Location: [facility];  Service: Orthopedics;  Laterality: Left;     OB History   No  obstetric history on file.     Family History  Problem Relation Age of Onset   Dementia Mother    Heart Problems Father    Dementia Sister    Heart Problems Sister    Other Other        no premature CAD.   Tuberculosis Maternal Grandmother    Heart Problems Paternal Grandfather     Social History   Tobacco Use   Smoking status: Former    Types: Cigarettes    Quit date: 06/06/1967    Years since quitting: 53.4   Smokeless tobacco: Former  Scientific laboratory technician Use: Never used  Substance Use Topics   Alcohol use: No   Drug use: No    Home Medications Prior to Admission medications   Medication Sig Start Date End Date Taking? Authorizing Provider  acetaminophen (TYLENOL) 325 MG tablet Take 1-2 tablets (325-650 mg total) by mouth every 6 (six) hours as needed for  mild pain (pain score 1-3 or temp > 100.5). 03/19/20   Mcarthur Rossetti, MD  amiodarone (PACERONE) 200 MG tablet Take 2 tablets by mouth for 7 days.  Then decrease to one tablet by mouth daily. Patient taking differently: Take 2 tablets by mouth for 7 days.  Then decrease to one tablet by mouth daily. 10/22/19   Vickie Epley, MD  amLODipine (NORVASC) 2.5 MG tablet TAKE 1 TABLET BY MOUTH ONCE DAILY NEEDS  APPOINTMENT  WITH  CARDIOLOGIST 06/03/18   Buford Dresser, MD  apixaban (ELIQUIS) 5 MG TABS tablet Take 1 tablet (5 mg total) by mouth 2 (two) times daily. 10/16/19 07/29/20  Vickie Epley, MD  cholecalciferol (VITAMIN D3) 25 MCG (1000 UNIT) tablet Take 1,000 Units by mouth daily.    [provider]  clopidogrel (PLAVIX) 75 MG tablet Take 1 tablet (75 mg total) by mouth daily. 07/17/13   Jacolyn Reedy, MD  ezetimibe (ZETIA) 10 MG tablet Take 1 tablet (10 mg total) by mouth daily. 10/17/19 07/29/20  Richardo Priest, MD  famotidine (PEPCID) 10 MG tablet Take 10 mg by mouth daily as needed for heartburn or indigestion.    [provider]  levothyroxine (SYNTHROID, LEVOTHROID) 50 MCG  tablet Take 50 mcg by mouth daily before breakfast.    [provider]  nitroGLYCERIN (NITROSTAT) 0.4 MG SL tablet Place 0.4 mg under the tongue every 5 (five) minutes as needed for chest pain.    [provider]    Allergies    Atorvastatin, Codeine, Crestor [rosuvastatin], Pravastatin, Simvastatin, and Ticagrelor  Review of Systems   Review of Systems  Constitutional:  Negative for appetite change.  Respiratory:  Positive for cough, shortness of breath and wheezing.   Cardiovascular:  Positive for leg swelling. Negative for chest pain.  Gastrointestinal:  Negative for abdominal distention.  Genitourinary:  Negative for flank pain.  Musculoskeletal:  Negative for back pain.  Skin:  Negative for pallor.  Neurological:  Negative for weakness.  Psychiatric/Behavioral:  Negative for confusion.    Physical Exam Updated Vital Signs BP 100/83   Pulse 60   Temp 98.4 F (36.9 C) (Oral)   Resp (!) 23   Ht 4\' 11"  (1.499 m)   Wt 80.7 kg   SpO2 93%   BMI 35.95 kg/m   Physical Exam Vitals and nursing note reviewed.  HENT:     Head: Atraumatic.  Cardiovascular:     Rate and Rhythm: Regular rhythm.  Pulmonary:     Comments: Rales bilateral bases with wheezes. Chest:     Chest wall: No tenderness.  Abdominal:     Tenderness: There is no abdominal tenderness.  Musculoskeletal:     Cervical back: Neck supple.     Right lower leg: Edema present.     Left lower leg: Edema present.     Comments: Mild edema bilateral lower extremities.  Skin:    Capillary Refill: Capillary refill takes less than 2 seconds.  Neurological:     Mental Status: She is alert and oriented to person, place, and time.    ED Results / Procedures / Treatments   Labs (all labs ordered are listed, but only abnormal results are displayed) Labs Reviewed  COMPREHENSIVE METABOLIC PANEL - Abnormal; Notable for the following components:      Result Value   Glucose, Bld 101 (*)    GFR, Estimated  57 (*)    All other components within normal limits  BRAIN NATRIURETIC PEPTIDE -  Abnormal; Notable for the following components:   B Natriuretic Peptide 134.9 (*)    All other components within normal limits  RESP PANEL BY RT-PCR (FLU A&B, COVID) ARPGX2  CBC  TROPONIN I (HIGH SENSITIVITY)  TROPONIN I (HIGH SENSITIVITY)    EKG EKG Interpretation  Date/Time:  Friday November 12 2020 16:59:39 EDT Ventricular Rate:  78 PR Interval:  250 QRS Duration: 169 QT Interval:  462 QTC Calculation: 527 R Axis:   78 Text Interpretation: Sinus or ectopic atrial rhythm Prolonged PR interval Right bundle branch block No significant change since last tracing Confirmed by Davonna Belling [phone]) on 11/12/2020 10:02:09 PM  Radiology DG Chest Portable 1 View  Result Date: 11/12/2020 CLINICAL DATA:  Shortness of breath EXAM: PORTABLE CHEST 1 VIEW COMPARISON:  08/23/2019, 08/20/2019 FINDINGS: Post sternotomy changes and left-sided pacing device as before. Cardiomegaly. No pleural effusion, pneumothorax or focal airspace disease. Aortic atherosclerosis. IMPRESSION: Cardiomegaly without edema or infiltrate Electronically Signed   By: Donavan Foil M.D.   On: 11/12/2020 18:06    Procedures Procedures   Medications Ordered in ED Medications  albuterol (PROVENTIL) (2.5 MG/3ML) 0.083% nebulizer solution 5 mg (has no administration in time range)  ipratropium (ATROVENT) nebulizer solution 0.5 mg (has no administration in time range)    ED Course  I have reviewed the triage vital signs and the nursing notes.  Pertinent labs & imaging results that were available during my care of the patient were reviewed by me and considered in my medical decision making (see chart for details).    MDM Rules/Calculators/A&P                           Patient presents with shortness of breath.  Has had a history of same.  Is somewhat wheezy but also has some rales.  Some mild edema on legs but no edema on x-ray.  BNP  mildly elevated.  Had felt better after breathing treatment at PCPs office.  Somewhat diffuse wheezing.  With wheezing and rales pulm embolism felt less likely.  Will discuss with unassigned medicine for admission.  May be combination of wheezing and CHF. Final Clinical Impression(s) / ED Diagnoses Final diagnoses:  Dyspnea, unspecified type    Rx / DC Orders ED Discharge Orders     None        Davonna Belling, MD 11/12/20 2226

## 2020-11-12 NOTE — ED Notes (Signed)
Ambulated to BR with 2 assist. Very weak with ambulation, but maintained 02 sats 97-99% on RA

## 2020-11-12 NOTE — ED Triage Notes (Signed)
From PCP with new onset SOB, exacerbation possible PNE, undx heart failure per medic. Crackles throughout,

## 2020-11-12 NOTE — ED Notes (Signed)
IV team placed line. No blood return for labs. MD made aware.

## 2020-11-13 ENCOUNTER — Observation Stay (HOSPITAL_COMMUNITY): Payer: Medicare Other

## 2020-11-13 ENCOUNTER — Other Ambulatory Visit: Payer: Self-pay

## 2020-11-13 ENCOUNTER — Encounter (HOSPITAL_COMMUNITY): Payer: Self-pay | Admitting: Internal Medicine

## 2020-11-13 DIAGNOSIS — K224 Dyskinesia of esophagus: Secondary | ICD-10-CM | POA: Diagnosis present

## 2020-11-13 DIAGNOSIS — I252 Old myocardial infarction: Secondary | ICD-10-CM | POA: Diagnosis not present

## 2020-11-13 DIAGNOSIS — R7989 Other specified abnormal findings of blood chemistry: Secondary | ICD-10-CM

## 2020-11-13 DIAGNOSIS — R9431 Abnormal electrocardiogram [ECG] [EKG]: Secondary | ICD-10-CM

## 2020-11-13 DIAGNOSIS — Z8616 Personal history of COVID-19: Secondary | ICD-10-CM | POA: Diagnosis not present

## 2020-11-13 DIAGNOSIS — I25810 Atherosclerosis of coronary artery bypass graft(s) without angina pectoris: Secondary | ICD-10-CM | POA: Diagnosis present

## 2020-11-13 DIAGNOSIS — I110 Hypertensive heart disease with heart failure: Secondary | ICD-10-CM | POA: Diagnosis present

## 2020-11-13 DIAGNOSIS — K766 Portal hypertension: Secondary | ICD-10-CM | POA: Diagnosis present

## 2020-11-13 DIAGNOSIS — I5033 Acute on chronic diastolic (congestive) heart failure: Secondary | ICD-10-CM | POA: Diagnosis present

## 2020-11-13 DIAGNOSIS — K219 Gastro-esophageal reflux disease without esophagitis: Secondary | ICD-10-CM | POA: Diagnosis present

## 2020-11-13 DIAGNOSIS — Z885 Allergy status to narcotic agent status: Secondary | ICD-10-CM | POA: Diagnosis not present

## 2020-11-13 DIAGNOSIS — I495 Sick sinus syndrome: Secondary | ICD-10-CM | POA: Diagnosis present

## 2020-11-13 DIAGNOSIS — R051 Acute cough: Secondary | ICD-10-CM | POA: Diagnosis not present

## 2020-11-13 DIAGNOSIS — Z87891 Personal history of nicotine dependence: Secondary | ICD-10-CM | POA: Diagnosis not present

## 2020-11-13 DIAGNOSIS — I480 Paroxysmal atrial fibrillation: Secondary | ICD-10-CM | POA: Diagnosis present

## 2020-11-13 DIAGNOSIS — Z955 Presence of coronary angioplasty implant and graft: Secondary | ICD-10-CM | POA: Diagnosis not present

## 2020-11-13 DIAGNOSIS — Z66 Do not resuscitate: Secondary | ICD-10-CM | POA: Diagnosis present

## 2020-11-13 DIAGNOSIS — Z7901 Long term (current) use of anticoagulants: Secondary | ICD-10-CM | POA: Diagnosis not present

## 2020-11-13 DIAGNOSIS — E871 Hypo-osmolality and hyponatremia: Secondary | ICD-10-CM | POA: Diagnosis present

## 2020-11-13 DIAGNOSIS — R0603 Acute respiratory distress: Secondary | ICD-10-CM | POA: Diagnosis not present

## 2020-11-13 DIAGNOSIS — R0602 Shortness of breath: Secondary | ICD-10-CM | POA: Diagnosis present

## 2020-11-13 DIAGNOSIS — I050 Rheumatic mitral stenosis: Secondary | ICD-10-CM | POA: Diagnosis present

## 2020-11-13 DIAGNOSIS — I482 Chronic atrial fibrillation, unspecified: Secondary | ICD-10-CM

## 2020-11-13 DIAGNOSIS — Z79899 Other long term (current) drug therapy: Secondary | ICD-10-CM | POA: Diagnosis not present

## 2020-11-13 DIAGNOSIS — I5023 Acute on chronic systolic (congestive) heart failure: Secondary | ICD-10-CM | POA: Diagnosis not present

## 2020-11-13 DIAGNOSIS — E669 Obesity, unspecified: Secondary | ICD-10-CM | POA: Diagnosis present

## 2020-11-13 DIAGNOSIS — E890 Postprocedural hypothyroidism: Secondary | ICD-10-CM | POA: Diagnosis present

## 2020-11-13 DIAGNOSIS — Z950 Presence of cardiac pacemaker: Secondary | ICD-10-CM | POA: Diagnosis not present

## 2020-11-13 DIAGNOSIS — R1314 Dysphagia, pharyngoesophageal phase: Secondary | ICD-10-CM | POA: Diagnosis present

## 2020-11-13 DIAGNOSIS — Z96642 Presence of left artificial hip joint: Secondary | ICD-10-CM | POA: Diagnosis present

## 2020-11-13 DIAGNOSIS — Z6835 Body mass index (BMI) 35.0-35.9, adult: Secondary | ICD-10-CM | POA: Diagnosis not present

## 2020-11-13 LAB — RESPIRATORY PANEL BY PCR

## 2020-11-13 LAB — CBC
HCT: 36.8 % (ref 36.0–46.0)
Hemoglobin: 12.2 g/dL (ref 12.0–15.0)
MCH: 30.3 pg (ref 26.0–34.0)
MCHC: 33.2 g/dL (ref 30.0–36.0)
MCV: 91.5 fL (ref 80.0–100.0)
Platelets: 195 10*3/uL (ref 150–400)
RBC: 4.02 MIL/uL (ref 3.87–5.11)
RDW: 14.1 % (ref 11.5–15.5)
WBC: 8.9 10*3/uL (ref 4.0–10.5)
nRBC: 0 % (ref 0.0–0.2)

## 2020-11-13 LAB — PROTIME-INR
INR: 1.1 (ref 0.8–1.2)
Prothrombin Time: 14.5 seconds (ref 11.4–15.2)

## 2020-11-13 LAB — RESP PANEL BY RT-PCR (FLU A&B, COVID) ARPGX2
Influenza A by PCR: NEGATIVE
Influenza B by PCR: NEGATIVE
SARS Coronavirus 2 by RT PCR: NEGATIVE

## 2020-11-13 LAB — APTT: aPTT: 37 seconds — ABNORMAL HIGH (ref 24–36)

## 2020-11-13 LAB — COMPREHENSIVE METABOLIC PANEL
ALT: 22 U/L (ref 0–44)
AST: 25 U/L (ref 15–41)
Albumin: 4.1 g/dL (ref 3.5–5.0)
Alkaline Phosphatase: 56 U/L (ref 38–126)
Anion gap: 10 (ref 5–15)
BUN: 16 mg/dL (ref 8–23)
CO2: 22 mmol/L (ref 22–32)
Calcium: 9.3 mg/dL (ref 8.9–10.3)
Chloride: 103 mmol/L (ref 98–111)
Creatinine, Ser: 0.92 mg/dL (ref 0.44–1.00)
GFR, Estimated: 60 mL/min — ABNORMAL LOW (ref >60)
Glucose, Bld: 137 mg/dL — ABNORMAL HIGH (ref 70–99)
Potassium: 3.9 mmol/L (ref 3.5–5.1)
Sodium: 135 mmol/L (ref 135–145)
Total Bilirubin: 1.1 mg/dL (ref 0.3–1.2)
Total Protein: 6.8 g/dL (ref 6.5–8.1)

## 2020-11-13 LAB — PHOSPHORUS: Phosphorus: 3.8 mg/dL (ref 2.5–4.6)

## 2020-11-13 LAB — MAGNESIUM: Magnesium: 2 mg/dL (ref 1.7–2.4)

## 2020-11-13 MED ORDER — ENOXAPARIN SODIUM 40 MG/0.4ML IJ SOSY: 40.0000 mg | PREFILLED_SYRINGE | INTRAMUSCULAR | Status: DC

## 2020-11-13 MED ORDER — AMLODIPINE BESYLATE 2.5 MG PO TABS
2.5000 mg | ORAL_TABLET | Freq: Every day | ORAL | Status: DC
  Administered 2020-11-13 – 2020-11-15 (×3): 2.5 mg via ORAL
  Filled 2020-11-13 (×4): qty 1

## 2020-11-13 MED ORDER — FUROSEMIDE 10 MG/ML IJ SOLN
20.0000 mg | Freq: Once | INTRAMUSCULAR | Status: AC
  Administered 2020-11-13: 20 mg via INTRAVENOUS
  Filled 2020-11-13: qty 2

## 2020-11-13 MED ORDER — BENZONATATE 100 MG PO CAPS
100.0000 mg | ORAL_CAPSULE | Freq: Three times a day (TID) | ORAL | Status: DC | PRN
  Administered 2020-11-13 – 2020-11-15 (×5): 100 mg via ORAL
  Filled 2020-11-13 (×4): qty 1

## 2020-11-13 MED ORDER — VITAMIN D 25 MCG (1000 UNIT) PO TABS
1000.0000 [IU] | ORAL_TABLET | Freq: Every day | ORAL | Status: DC
  Administered 2020-11-13 – 2020-11-18 (×6): 1000 [IU] via ORAL
  Filled 2020-11-13 (×6): qty 1

## 2020-11-13 MED ORDER — METHYLPREDNISOLONE SODIUM SUCC 40 MG IJ SOLR
40.0000 mg | Freq: Two times a day (BID) | INTRAMUSCULAR | Status: DC
  Administered 2020-11-13 – 2020-11-14 (×3): 40 mg via INTRAVENOUS
  Filled 2020-11-13 (×4): qty 1

## 2020-11-13 MED ORDER — AMIODARONE HCL 200 MG PO TABS
200.0000 mg | ORAL_TABLET | Freq: Every day | ORAL | Status: DC
  Administered 2020-11-13 – 2020-11-18 (×6): 200 mg via ORAL
  Filled 2020-11-13 (×6): qty 1

## 2020-11-13 MED ORDER — NITROGLYCERIN 0.4 MG SL SUBL: 0.4000 mg | SUBLINGUAL_TABLET | SUBLINGUAL | Status: DC | PRN

## 2020-11-13 MED ORDER — EZETIMIBE 10 MG PO TABS
10.0000 mg | ORAL_TABLET | Freq: Every day | ORAL | Status: DC
  Administered 2020-11-13 – 2020-11-18 (×6): 10 mg via ORAL
  Filled 2020-11-13 (×6): qty 1

## 2020-11-13 MED ORDER — PANTOPRAZOLE SODIUM 40 MG PO TBEC
40.0000 mg | DELAYED_RELEASE_TABLET | Freq: Every day | ORAL | Status: DC
  Administered 2020-11-13 – 2020-11-14 (×2): 40 mg via ORAL
  Filled 2020-11-13 (×2): qty 1

## 2020-11-13 MED ORDER — LEVOTHYROXINE SODIUM 50 MCG PO TABS
50.0000 ug | ORAL_TABLET | Freq: Every day | ORAL | Status: DC
  Administered 2020-11-14 – 2020-11-18 (×5): 50 ug via ORAL
  Filled 2020-11-13 (×5): qty 1

## 2020-11-13 MED ORDER — CLOPIDOGREL BISULFATE 75 MG PO TABS
75.0000 mg | ORAL_TABLET | Freq: Every day | ORAL | Status: DC
  Administered 2020-11-13 – 2020-11-18 (×6): 75 mg via ORAL
  Filled 2020-11-13 (×6): qty 1

## 2020-11-13 MED ORDER — APIXABAN 5 MG PO TABS
5.0000 mg | ORAL_TABLET | Freq: Two times a day (BID) | ORAL | Status: DC
  Administered 2020-11-13 – 2020-11-18 (×11): 5 mg via ORAL
  Filled 2020-11-13 (×11): qty 1

## 2020-11-13 MED ORDER — DM-GUAIFENESIN ER 30-600 MG PO TB12
1.0000 | ORAL_TABLET | Freq: Two times a day (BID) | ORAL | Status: DC
  Administered 2020-11-13 – 2020-11-14 (×5): 1 via ORAL
  Filled 2020-11-13 (×6): qty 1

## 2020-11-13 MED ORDER — FAMOTIDINE 20 MG PO TABS: 10.0000 mg | ORAL_TABLET | Freq: Every day | ORAL | Status: DC | PRN

## 2020-11-13 MED ORDER — IPRATROPIUM-ALBUTEROL 0.5-2.5 (3) MG/3ML IN SOLN
3.0000 mL | Freq: Four times a day (QID) | RESPIRATORY_TRACT | Status: DC
  Administered 2020-11-13 – 2020-11-14 (×6): 3 mL via RESPIRATORY_TRACT
  Filled 2020-11-13 (×6): qty 3

## 2020-11-13 MED ORDER — IPRATROPIUM-ALBUTEROL 0.5-2.5 (3) MG/3ML IN SOLN
3.0000 mL | RESPIRATORY_TRACT | Status: DC | PRN
  Administered 2020-11-16: 3 mL via RESPIRATORY_TRACT

## 2020-11-13 NOTE — Progress Notes (Signed)
PROGRESS NOTE    Heather [name]  NFA:[id number] DOB: 1930/07/08 DOA: 11/12/2020 PCP: Leonard Downing, MD    Chief Complaint  Patient presents with   Shortness of Breath    Brief Narrative:    Subjective:  Continue having cough, doe, denies chest pain, reports edema in legs are better today Daughter at bedside  Assessment & Plan:   Principal Problem:   Shortness of breath Active Problems:   CAD (coronary artery disease) of artery bypass graft, s/p PCI stent to VG->RCA 04/30/15   Obesity (BMI 30-39.9)   Hyperlipidemia   Hypothyroidism   Hypertensive heart disease   GERD (gastroesophageal reflux disease)   Elevated brain natriuretic peptide (BNP) level   Prolonged QT interval   Atrial fibrillation, chronic (HCC)   SOB (shortness of breath)   Shortness of breath/diffuse rhonchi/rales/scattered wheezing bilateral lung exam, she did have tachypnea on presentation, no documented hypoxia -Patient reports cough lingered after COVID infection in July this year -Respiratory viral panel negative -CT chest showed multiple small nodules, 41mm, no acute infiltrate - respiratory symptom from ?Possible acute on chronic diastolic CHF?, History of mitral valve repair -She does has bilateral lower extremity edema on exam -We will give 1 dose of IV Lasix, monitor effect, continue nebs and steroids for now  -Echo pending    PAF/SSS status post pacemaker placement on amiodarone and Eliquis  With CT chest lung findings and thyroid findings, question long-term use of amiodarone Plan to discuss with cardiology tomorrow   CAD status post CABG -Denies chest pain -Continue home regimen Plavix, diarrhea  HTN; on norvasc   Hypothyroidism, on Synthroid supplement TSH unremarkable in May/2022 right-sided thyromegaly  Report followed by ENT was told not able to resect due to location    Body mass index is 35.95 kg/m.Marland Kitchen        Unresulted Labs (From admission, onward)      Start     Ordered   11/14/20 0500  Procalcitonin  Daily,   R      11/13/20 1245   11/14/20 4696  Basic metabolic panel  Tomorrow morning,   R        11/13/20 1921   11/14/20 0500  Magnesium  Tomorrow morning,   R        11/13/20 1921   11/13/20 1549  Expectorated Sputum Assessment w Gram Stain, Rflx to Resp Cult  Once,   R        11/13/20 1548              DVT prophylaxis: SCDs Start: 11/13/20 0049 apixaban (ELIQUIS) tablet 5 mg   Code Status: DNR, verified with patient Family Communication:  Disposition:   Status is: Inpatient  Dispo: The patient is from: Home, lives by herself, daughter lives next door              Anticipated d/c is to: Home              Anticipated d/c date is: To be determined                Consultants:  None  Procedures:  None  Antimicrobials:   Anti-infectives (From admission, onward)    None           Objective: Vitals:   11/13/20 1215 11/13/20 1453 11/13/20 1950 11/13/20 2010  BP: (!) 144/52 (!) 158/59  (!) 133/47  Pulse: 96 83  85  Resp: (!) 30 20  20   Temp:  98.1 F (  36.7 C)  (!) 97.5 F (36.4 C)  TempSrc:  Oral  Oral  SpO2: 96% 100% 98% 96%  Weight:      Height:        Intake/Output Summary (Last 24 hours) at 11/13/2020 2229 Last data filed at 11/13/2020 2108 Gross per 24 hour  Intake 480 ml  Output 2000 ml  Net -1520 ml   Filed Weights   11/12/20 1641  Weight: 80.7 kg    Examination:  General exam: alert, awake, communicative,calm, NAD Respiratory system: bilateral diffuse rhonch, rales, occasional wheezing, slight tachypnea.  Cardiovascular system:  RRR.  Gastrointestinal system: Abdomen is nondistended, soft and nontender.  Normal bowel sounds heard. Central nervous system: Alert and oriented. No focal neurological deficits. Extremities:  trace edema, left > right Skin: No rashes, lesions or ulcers Psychiatry: Judgement and insight appear normal. Mood & affect appropriate.     Data Reviewed: I  have personally reviewed following labs and imaging studies  CBC: Recent Labs  Lab 11/12/20 1737 11/13/20 0539  WBC 8.3 8.9  HGB 13.2 12.2  HCT 40.3 36.8  MCV 92.2 91.5  PLT 229 782    Basic Metabolic Panel: Recent Labs  Lab 11/12/20 1737 11/13/20 0539  NA 139 135  K 4.0 3.9  CL 103 103  CO2 25 22  GLUCOSE 101* 137*  BUN 16 16  CREATININE 0.96 0.92  CALCIUM 10.0 9.3  MG  --  2.0  PHOS  --  3.8    GFR: Estimated Creatinine Clearance: 38.8 mL/min (by C-G formula based on SCr of 0.92 mg/dL).  Liver Function Tests: Recent Labs  Lab 11/12/20 1737 11/13/20 0539  AST 25 25  ALT 26 22  ALKPHOS 59 56  BILITOT 1.1 1.1  PROT 7.4 6.8  ALBUMIN 4.5 4.1    CBG: No results for input(s): GLUCAP in the last 168 hours.   Recent Results (from the past 240 hour(s))  Resp Panel by RT-PCR (Flu A&B, Covid) Nasopharyngeal Swab     Status: None   Collection Time: 11/13/20 12:40 AM   Specimen: Nasopharyngeal Swab; Nasopharyngeal(NP) swabs in vial transport medium  Result Value Ref Range Status   SARS Coronavirus 2 by RT PCR NEGATIVE NEGATIVE Final    Comment: (NOTE) SARS-CoV-2 target nucleic acids are NOT DETECTED.  The SARS-CoV-2 RNA is generally detectable in upper respiratory specimens during the acute phase of infection. The lowest concentration of SARS-CoV-2 viral copies this assay can detect is 138 copies/mL. A negative result does not preclude SARS-Cov-2 infection and should not be used as the sole basis for treatment or other patient management decisions. A negative result may occur with  improper specimen collection/handling, submission of specimen other than nasopharyngeal swab, presence of viral mutation(s) within the areas targeted by this assay, and inadequate number of viral copies(<138 copies/mL). A negative result must be combined with clinical observations, patient history, and epidemiological information. The expected result is Negative.  Fact Sheet for  Patients:  EntrepreneurPulse.com.au  Fact Sheet for Healthcare Providers:  IncredibleEmployment.be  This test is no t yet approved or cleared by the [place] FDA and  has been authorized for detection and/or diagnosis of SARS-CoV-2 by FDA under an Emergency Use Authorization (EUA). This EUA will remain  in effect (meaning this test can be used) for the duration of the COVID-19 declaration under Section 564(b)(1) of the Act, 21 U.S.C.section 360bbb-3(b)(1), unless the authorization is terminated  or revoked sooner.       Influenza A by  PCR NEGATIVE NEGATIVE Final   Influenza B by PCR NEGATIVE NEGATIVE Final    Comment: (NOTE) The Xpert Xpress SARS-CoV-2/FLU/RSV plus assay is intended as an aid in the diagnosis of influenza from Nasopharyngeal swab specimens and should not be used as a sole basis for treatment. Nasal washings and aspirates are unacceptable for Xpert Xpress SARS-CoV-2/FLU/RSV testing.  Fact Sheet for Patients: EntrepreneurPulse.com.au  Fact Sheet for Healthcare Providers: IncredibleEmployment.be  This test is not yet approved or cleared by the [place] FDA and has been authorized for detection and/or diagnosis of SARS-CoV-2 by FDA under an Emergency Use Authorization (EUA). This EUA will remain in effect (meaning this test can be used) for the duration of the COVID-19 declaration under Section 564(b)(1) of the Act, 21 U.S.C. section 360bbb-3(b)(1), unless the authorization is terminated or revoked.  Performed at [facility], [place] [address] [place], [place] [address]   Respiratory (~20 pathogens) panel by PCR     Status: None   Collection Time: 11/13/20 12:45 PM   Specimen: Nasopharyngeal Swab; Respiratory  Result Value Ref Range Status   Adenovirus NOT DETECTED NOT DETECTED Final   Coronavirus 229E NOT DETECTED NOT DETECTED Final    Comment: (NOTE) The Coronavirus on  the Respiratory Panel, DOES NOT test for the novel  Coronavirus (2019 nCoV)    Coronavirus HKU1 NOT DETECTED NOT DETECTED Final   Coronavirus NL63 NOT DETECTED NOT DETECTED Final   Coronavirus OC43 NOT DETECTED NOT DETECTED Final   Metapneumovirus NOT DETECTED NOT DETECTED Final   Rhinovirus / Enterovirus NOT DETECTED NOT DETECTED Final   Influenza A NOT DETECTED NOT DETECTED Final   Influenza B NOT DETECTED NOT DETECTED Final   Parainfluenza Virus 1 NOT DETECTED NOT DETECTED Final   Parainfluenza Virus 2 NOT DETECTED NOT DETECTED Final   Parainfluenza Virus 3 NOT DETECTED NOT DETECTED Final   Parainfluenza Virus 4 NOT DETECTED NOT DETECTED Final   Respiratory Syncytial Virus NOT DETECTED NOT DETECTED Final   Bordetella pertussis NOT DETECTED NOT DETECTED Final   Bordetella Parapertussis NOT DETECTED NOT DETECTED Final   Chlamydophila pneumoniae NOT DETECTED NOT DETECTED Final   Mycoplasma pneumoniae NOT DETECTED NOT DETECTED Final    Comment: Performed at [facility], [place]. [address] [place], [place] [address]         Radiology Studies: CT CHEST WO CONTRAST  Result Date: 11/13/2020 CLINICAL DATA:  Pneumonia. EXAM: CT CHEST WITHOUT CONTRAST TECHNIQUE: Multidetector CT imaging of the chest was performed following the standard protocol without IV contrast. COMPARISON:  June 16, 2013. FINDINGS: Cardiovascular: Status post coronary artery bypass graft. Atherosclerosis of thoracic aorta is noted. Mild cardiomegaly is noted. No pericardial effusion is noted. Mediastinum/Nodes: Stable right-sided thyromegaly is noted. No adenopathy is noted. The esophagus is unremarkable. Lungs/Pleura: No pneumothorax or pleural effusion is noted. Multiple small bilateral pulmonary nodules are noted, including 3 mm nodule seen anteriorly in right lower lobe best seen on image number 87 of series 8 which is unchanged compared to prior exam. 3 mm nodule is noted in left lower lobe best seen on image number  86 of series 8 which is not visualized on prior exam. 3 mm nodule is noted laterally in left lower lobe best seen on image number 79 of series 8 which is not clearly visualized on prior exam. 3 mm nodule is noted in left lower lobe best seen on image number 61 of series 8 which is not seen on prior exam. No significant  consolidation is noted. Upper Abdomen: No acute abnormality. Musculoskeletal: No chest wall mass or suspicious bone lesions identified. IMPRESSION: No definite evidence of consolidation is noted. Multiple small bilateral pulmonary nodules are noted, the largest measuring 3 mm. Several of these are not visualized on prior exam. No follow-up needed if patient is low-risk (and has no known or suspected primary neoplasm). Non-contrast chest CT can be considered in 12 months if patient is high-risk. This recommendation follows the consensus statement: Guidelines for Management of Incidental Pulmonary Nodules Detected on CT Images: From the Fleischner Society 2017; Radiology 2017; 284:228-243. Status post coronary bypass graft. Aortic Atherosclerosis (ICD10-I70.0). Electronically Signed   By: Marijo Conception M.D.   On: 11/13/2020 13:23   DG Chest Portable 1 View  Result Date: 11/12/2020 CLINICAL DATA:  Shortness of breath EXAM: PORTABLE CHEST 1 VIEW COMPARISON:  08/23/2019, 08/20/2019 FINDINGS: Post sternotomy changes and left-sided pacing device as before. Cardiomegaly. No pleural effusion, pneumothorax or focal airspace disease. Aortic atherosclerosis. IMPRESSION: Cardiomegaly without edema or infiltrate Electronically Signed   By: Donavan Foil M.D.   On: 11/12/2020 18:06        Scheduled Meds:  amiodarone  200 mg Oral Daily   amLODipine  2.5 mg Oral QHS   apixaban  5 mg Oral BID   cholecalciferol  1,000 Units Oral Daily   clopidogrel  75 mg Oral Daily   dextromethorphan-guaiFENesin  1 tablet Oral BID   ezetimibe  10 mg Oral Daily   ipratropium-albuterol  3 mL Nebulization Q6H    [START ON 11/14/2020] levothyroxine  50 mcg Oral QAC breakfast   methylPREDNISolone (SOLU-MEDROL) injection  40 mg Intravenous Q12H   pantoprazole  40 mg Oral Daily   Continuous Infusions:   LOS: 0 days   Time spent: 55mins Greater than 50% of this time was spent in counseling, explanation of diagnosis, planning of further management, and coordination of care.   Voice Recognition Viviann Spare dictation system was used to create this note, attempts have been made to correct errors. Please contact the author with questions and/or clarifications.   Florencia Reasons, MD PhD FACP [facility]  Available via Epic secure chat 7am-7pm for nonurgent issues Please page for urgent issues To page the attending provider between 7A-7P or the covering provider during after hours 7P-7A, please log into the web site [email or web address] and access using universal [place] password for that web site. If you do not have the password, please call the hospital operator.    11/13/2020, 10:29 PM

## 2020-11-13 NOTE — ED Notes (Signed)
ED TO INPATIENT HANDOFF REPORT  ED Nurse Name and Phone #: Cindie Laroche [phone]  S Name/Age/Gender Heather [name] 87 y.o. female Room/Bed: 039C/039C  Code Status   Code Status: Full Code  Home/SNF/Other Home Patient oriented to: self, place, time, and situation Is this baseline? Yes   Triage Complete: Triage complete  Chief Complaint Shortness of breath [R06.02] SOB (shortness of breath) [R06.02]  Triage Note From PCP with new onset SOB, exacerbation possible PNE, undx heart failure per medic. Crackles throughout,    Allergies Allergies  Allergen Reactions   Atorvastatin     Muscle ache   Codeine Nausea And Vomiting   Crestor [Rosuvastatin]     Muscle ache   Pravastatin     Muscle ache   Simvastatin     Muscle cramps    Ticagrelor     Dyspnea     Level of Care/Admitting Diagnosis ED Disposition     ED Disposition  Admit   Condition  --   Comment  Hospital Area: [facility] [[id number]]  Level of Care: Telemetry Medical [[id number]]  May admit patient to [facility] or [facility] if equivalent level of care is available:: No  Covid Evaluation: Confirmed COVID Negative  Diagnosis: SOB (shortness of breath) [116579]  Admitting Physician: Florencia Reasons [[id number]]  Attending Physician: Florencia Reasons [[id number]]  Estimated length of stay: past midnight tomorrow  Certification:: I certify this patient will need inpatient services for at least 2 midnights          B Medical/Surgery History Past Medical History:  Diagnosis Date   Anxiety attack 01/03/2016   Aphasia 01/03/2016   Arthritis    CAD (coronary artery disease) of artery bypass graft, s/p PCI stent to VG->RCA 04/30/15 07/14/2013   Catheterization August/2003 showing severe three-vessel disease and moderate mitral regurgitation 08/28/2001 CABG with internal mammary graft to LAD, vein graft to circumflex, and vein graft to posterolateral branch and mitral valve repair by Dr. Roxy Manns Inferior infarction  07/12/13 with occlusion of the vein graft to right coronary artery, stenting with a 3.5 x 18 mm resolute stent. Patent internal ma   CAD (coronary artery disease), native coronary artery 07/14/2013   Catheterization August/2003 showing severe three-vessel disease and moderate mitral regurgitation 08/28/2001 CABG with internal mammary graft to LAD, vein graft to circumflex, and vein graft to posterolateral branch and mitral valve repair by Dr. 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 Patent internal mammary graft with competitive flow in the distal vessel, occluded right coronary artery, occluded circumflex, patent saphenous vein graft to circumflex with mild/moderate disease proximally, minimal LV damage    Chest pain 08/20/2019   Chronic diastolic CHF (congestive heart failure) ([place])    a. EF 60% by LV gram 01/2009.   Coronary artery disease    a. s/p MI in 1980;  b. 2003 CABG x 3 (LIMA->LAD, VG->OM, VG->RPL);  c. 01/2009 Cath: 3/3 patent grafts, native 3VD, EF 60%.   Diverticulitis    Dyspnea 01/03/2016   Elevated troponin    GERD (gastroesophageal reflux disease)    H/O mitral valve repair    a. 2003   History of kidney stones    Hyperlipidemia 07/14/2013   Intolerance to multiple statins including Crestor, pravastatin, and Lipitor    Hypertension    Hypertension, uncontrolled 07/19/2014   Hypertensive heart disease 07/14/2013   Hypothyroidism    NSTEMI (non-ST elevated myocardial infarction) ([place])  Other hyperlipidemia    PAF (paroxysmal atrial fibrillation) ([facility])    Pneumonia    Presence of permanent cardiac pacemaker 08/22/2019   S/P partial hysterectomy 1977   Sinus node dysfunction [facility])    Past Surgical History:  Procedure Laterality Date   ABDOMINAL HYSTERECTOMY  1977   APPENDECTOMY  1950   BLADDER SUSPENSION     tac   CARDIAC CATHETERIZATION  03,05,11   CARDIAC CATHETERIZATION N/A  04/30/2015   Procedure: Left Heart Cath and Cors/Grafts Angiography;  Surgeon: Belva Crome, MD;  Location: [facility];  Service: Cardiovascular;  Laterality: N/A;   CARDIAC CATHETERIZATION N/A 04/30/2015   Procedure: Coronary Stent Intervention;  Surgeon: Belva Crome, MD;  Location: [facility];  Service: Cardiovascular;  Laterality: N/A;  Proximal and Distal SVG to the PLA   CARDIAC CATHETERIZATION N/A 01/06/2016   Procedure: Left Heart Cath and Cors/Grafts Angiography;  Surgeon: Jettie Booze, MD;  Location: [facility];  Service: Cardiovascular;  Laterality: N/A;   CARPAL TUNNEL RELEASE     right and left   CHOLECYSTECTOMY     CORONARY ARTERY BYPASS GRAFT  2003   KNEE ARTHROSCOPY     left   LEFT HEART CATHETERIZATION WITH CORONARY ANGIOGRAM N/A 07/12/2013   Procedure: LEFT HEART CATHETERIZATION WITH CORONARY ANGIOGRAM;  Surgeon: Jettie Booze, MD;  Location: [facility] CATH LAB;  Service: Cardiovascular;  Laterality: N/A;   LEFT TOTAL HIP ARTHROPLASTY ANTERIOR APPROACH (Left Hip)  03/16/2020   MITRAL VALVE REPAIR  2003   with cabg   PACEMAKER IMPLANT N/A 08/22/2019   Procedure: PACEMAKER IMPLANT;  Surgeon: Constance Haw, MD;  Location: [facility];  Service: Cardiovascular;  Laterality: N/A;   THYROIDECTOMY  1968   THYROIDECTOMY, PARTIAL     left   TOTAL HIP ARTHROPLASTY Left 03/16/2020   Procedure: LEFT TOTAL HIP ARTHROPLASTY ANTERIOR APPROACH;  Surgeon: Mcarthur Rossetti, MD;  Location: [facility];  Service: Orthopedics;  Laterality: Left;     A IV Location/Drains/Wounds Patient Lines/Drains/Airways Status     Active Line/Drains/Airways     Name Placement date Placement time Site Days   Peripheral IV 11/12/20 22 G 1" Right;Anterior Forearm 11/12/20  2000  Forearm  1   Incision (Closed) 08/22/19 Chest Left 08/22/19  1600  -- 449   Incision (Closed) 03/16/20 Hip Left 03/16/20  1327  -- 242            Intake/Output Last 24  hours  Intake/Output Summary (Last 24 hours) at 11/13/2020 1423 Last data filed at 11/12/2020 2251 Gross per 24 hour  Intake --  Output 400 ml  Net -400 ml    Labs/Imaging Results for orders placed or performed during the hospital encounter of 11/12/20 (from the past 48 hour(s))  Comprehensive metabolic panel     Status: Abnormal   Collection Time: 11/12/20  5:37 PM  Result Value Ref Range   Sodium 139 135 - 145 mmol/L   Potassium 4.0 3.5 - 5.1 mmol/L   Chloride 103 98 - 111 mmol/L   CO2 25 22 - 32 mmol/L   Glucose, Bld 101 (H) 70 - 99 mg/dL    Comment: Glucose reference range applies only to samples taken after fasting for at least 8 hours.   BUN 16 8 - 23 mg/dL   Creatinine, Ser 0.96 0.44 - 1.00 mg/dL   Calcium 10.0 8.9 - 10.3 mg/dL   Total Protein 7.4 6.5 - 8.1 g/dL  Albumin 4.5 3.5 - 5.0 g/dL   AST 25 15 - 41 U/L   ALT 26 0 - 44 U/L   Alkaline Phosphatase 59 38 - 126 U/L   Total Bilirubin 1.1 0.3 - 1.2 mg/dL   GFR, Estimated 57 (L) >60 mL/min    Comment: (NOTE) Calculated using the CKD-EPI Creatinine Equation (2021)    Anion gap 11 5 - 15    Comment: Performed at [facility] [address] [place], [place] [address]  Troponin I (High Sensitivity)     Status: None   Collection Time: 11/12/20  5:37 PM  Result Value Ref Range   Troponin I (High Sensitivity) 13 <18 ng/L    Comment: (NOTE) Elevated high sensitivity troponin I (hsTnI) values and significant  changes across serial measurements may suggest ACS but many other  chronic and acute conditions are known to elevate hsTnI results.  Refer to the "Links" section for chest pain algorithms and additional  guidance. Performed at [facility], [place] [address], [place], [place] [address]   Brain natriuretic peptide     Status: Abnormal   Collection Time: 11/12/20  5:37 PM  Result Value Ref Range   B Natriuretic Peptide 134.9 (H) 0.0 - 100.0 pg/mL    Comment: Performed at [facility] [address], [place], [place] [address]  CBC     Status: None   Collection Time: 11/12/20  5:37 PM  Result Value Ref Range   WBC 8.3 4.0 - 10.5 K/uL   RBC 4.37 3.87 - 5.11 MIL/uL   Hemoglobin 13.2 12.0 - 15.0 g/dL   HCT 40.3 36.0 - 46.0 %   MCV 92.2 80.0 - 100.0 fL   MCH 30.2 26.0 - 34.0 pg   MCHC 32.8 30.0 - 36.0 g/dL   RDW 13.9 11.5 - 15.5 %   Platelets 229 150 - 400 K/uL   nRBC 0.0 0.0 - 0.2 %    Comment: Performed at [facility], [place] [address] [place], [place] [address]  Troponin I (High Sensitivity)     Status: None   Collection Time: 11/12/20  9:21 PM  Result Value Ref Range   Troponin I (High Sensitivity) 14 <18 ng/L    Comment: (NOTE) Elevated high sensitivity troponin I (hsTnI) values and significant  changes across serial measurements may suggest ACS but many other  chronic and acute conditions are known to elevate hsTnI results.  Refer to the "Links" section for chest pain algorithms and additional  guidance. Performed at [facility], [place] [address], [place], [place] [address]   Resp Panel by RT-PCR (Flu A&B, Covid) Nasopharyngeal Swab     Status: None   Collection Time: 11/13/20 12:40 AM   Specimen: Nasopharyngeal Swab; Nasopharyngeal(NP) swabs in vial transport medium  Result Value Ref Range   SARS Coronavirus 2 by RT PCR NEGATIVE NEGATIVE    Comment: (NOTE) SARS-CoV-2 target nucleic acids are NOT DETECTED.  The SARS-CoV-2 RNA is generally detectable in upper respiratory specimens during the acute phase of infection. The lowest concentration of SARS-CoV-2 viral copies this assay can detect is 138 copies/mL. A negative result does not preclude SARS-Cov-2 infection and should not be used as the sole basis for treatment or other patient management decisions. A negative result may occur with  improper specimen collection/handling, submission of specimen other than nasopharyngeal swab, presence of viral mutation(s) within the areas targeted by this assay,  and inadequate number of viral  copies(<138 copies/mL). A negative result must be combined with clinical observations, patient history, and epidemiological information. The expected result is Negative.  Fact Sheet for Patients:  EntrepreneurPulse.com.au  Fact Sheet for Healthcare Providers:  IncredibleEmployment.be  This test is no t yet approved or cleared by the [place] FDA and  has been authorized for detection and/or diagnosis of SARS-CoV-2 by FDA under an Emergency Use Authorization (EUA). This EUA will remain  in effect (meaning this test can be used) for the duration of the COVID-19 declaration under Section 564(b)(1) of the Act, 21 U.S.C.section 360bbb-3(b)(1), unless the authorization is terminated  or revoked sooner.       Influenza A by PCR NEGATIVE NEGATIVE   Influenza B by PCR NEGATIVE NEGATIVE    Comment: (NOTE) The Xpert Xpress SARS-CoV-2/FLU/RSV plus assay is intended as an aid in the diagnosis of influenza from Nasopharyngeal swab specimens and should not be used as a sole basis for treatment. Nasal washings and aspirates are unacceptable for Xpert Xpress SARS-CoV-2/FLU/RSV testing.  Fact Sheet for Patients: EntrepreneurPulse.com.au  Fact Sheet for Healthcare Providers: IncredibleEmployment.be  This test is not yet approved or cleared by the [place] FDA and has been authorized for detection and/or diagnosis of SARS-CoV-2 by FDA under an Emergency Use Authorization (EUA). This EUA will remain in effect (meaning this test can be used) for the duration of the COVID-19 declaration under Section 564(b)(1) of the Act, 21 U.S.C. section 360bbb-3(b)(1), unless the authorization is terminated or revoked.  Performed at [facility], [place] [address], [place], [place] [address]   Comprehensive metabolic panel     Status: Abnormal   Collection Time: 11/13/20  5:39 AM  Result  Value Ref Range   Sodium 135 135 - 145 mmol/L   Potassium 3.9 3.5 - 5.1 mmol/L   Chloride 103 98 - 111 mmol/L   CO2 22 22 - 32 mmol/L   Glucose, Bld 137 (H) 70 - 99 mg/dL    Comment: Glucose reference range applies only to samples taken after fasting for at least 8 hours.   BUN 16 8 - 23 mg/dL   Creatinine, Ser 0.92 0.44 - 1.00 mg/dL   Calcium 9.3 8.9 - 10.3 mg/dL   Total Protein 6.8 6.5 - 8.1 g/dL   Albumin 4.1 3.5 - 5.0 g/dL   AST 25 15 - 41 U/L   ALT 22 0 - 44 U/L   Alkaline Phosphatase 56 38 - 126 U/L   Total Bilirubin 1.1 0.3 - 1.2 mg/dL   GFR, Estimated 60 (L) >60 mL/min    Comment: (NOTE) Calculated using the CKD-EPI Creatinine Equation (2021)    Anion gap 10 5 - 15    Comment: Performed at [facility] [address], [place], [place] [address]  CBC     Status: None   Collection Time: 11/13/20  5:39 AM  Result Value Ref Range   WBC 8.9 4.0 - 10.5 K/uL   RBC 4.02 3.87 - 5.11 MIL/uL   Hemoglobin 12.2 12.0 - 15.0 g/dL   HCT 36.8 36.0 - 46.0 %   MCV 91.5 80.0 - 100.0 fL   MCH 30.3 26.0 - 34.0 pg   MCHC 33.2 30.0 - 36.0 g/dL   RDW 14.1 11.5 - 15.5 %   Platelets 195 150 - 400 K/uL   nRBC 0.0 0.0 - 0.2 %    Comment: Performed at [facility], [place] [address], [place], [place] [address]  Protime-INR  Status: None   Collection Time: 11/13/20  5:39 AM  Result Value Ref Range   Prothrombin Time 14.5 11.4 - 15.2 seconds   INR 1.1 0.8 - 1.2    Comment: (NOTE) INR goal varies based on device and disease states. Performed at [facility], [place] [address] [place], [place] [address]   APTT     Status: Abnormal   Collection Time: 11/13/20  5:39 AM  Result Value Ref Range   aPTT 37 (H) 24 - 36 seconds    Comment:        IF BASELINE aPTT IS ELEVATED, SUGGEST PATIENT RISK ASSESSMENT BE USED TO DETERMINE APPROPRIATE ANTICOAGULANT THERAPY. Performed at [facility], [place] [address], [place], [place] [address]   Magnesium     Status: None    Collection Time: 11/13/20  5:39 AM  Result Value Ref Range   Magnesium 2.0 1.7 - 2.4 mg/dL    Comment: Performed at [facility] [address] [place], [place] [address]  Phosphorus     Status: None   Collection Time: 11/13/20  5:39 AM  Result Value Ref Range   Phosphorus 3.8 2.5 - 4.6 mg/dL    Comment: Performed at [facility] [address] [place], [place] [address]   CT CHEST WO CONTRAST  Result Date: 11/13/2020 CLINICAL DATA:  Pneumonia. EXAM: CT CHEST WITHOUT CONTRAST TECHNIQUE: Multidetector CT imaging of the chest was performed following the standard protocol without IV contrast. COMPARISON:  June 16, 2013. FINDINGS: Cardiovascular: Status post coronary artery bypass graft. Atherosclerosis of thoracic aorta is noted. Mild cardiomegaly is noted. No pericardial effusion is noted. Mediastinum/Nodes: Stable right-sided thyromegaly is noted. No adenopathy is noted. The esophagus is unremarkable. Lungs/Pleura: No pneumothorax or pleural effusion is noted. Multiple small bilateral pulmonary nodules are noted, including 3 mm nodule seen anteriorly in right lower lobe best seen on image number 87 of series 8 which is unchanged compared to prior exam. 3 mm nodule is noted in left lower lobe best seen on image number 86 of series 8 which is not visualized on prior exam. 3 mm nodule is noted laterally in left lower lobe best seen on image number 79 of series 8 which is not clearly visualized on prior exam. 3 mm nodule is noted in left lower lobe best seen on image number 61 of series 8 which is not seen on prior exam. No significant consolidation is noted. Upper Abdomen: No acute abnormality. Musculoskeletal: No chest wall mass or suspicious bone lesions identified. IMPRESSION: No definite evidence of consolidation is noted. Multiple small bilateral pulmonary nodules are noted, the largest measuring 3 mm. Several of these are not visualized on prior exam. No follow-up needed if patient is  low-risk (and has no known or suspected primary neoplasm). Non-contrast chest CT can be considered in 12 months if patient is high-risk. This recommendation follows the consensus statement: Guidelines for Management of Incidental Pulmonary Nodules Detected on CT Images: From the Fleischner Society 2017; Radiology 2017; 284:228-243. Status post coronary bypass graft. Aortic Atherosclerosis (ICD10-I70.0). Electronically Signed   By: Marijo Conception M.D.   On: 11/13/2020 13:23   DG Chest Portable 1 View  Result Date: 11/12/2020 CLINICAL DATA:  Shortness of breath EXAM: PORTABLE CHEST 1 VIEW COMPARISON:  08/23/2019, 08/20/2019 FINDINGS: Post sternotomy changes and left-sided pacing device as before. Cardiomegaly. No pleural effusion, pneumothorax or focal airspace disease. Aortic atherosclerosis. IMPRESSION: Cardiomegaly without edema or infiltrate Electronically Signed  By: Donavan Foil M.D.   On: 11/12/2020 18:06    Pending Labs Unresulted Labs (From admission, onward)     Start     Ordered   11/14/20 0500  Procalcitonin  Daily,   R      11/13/20 1245   11/13/20 1245  Respiratory (~20 pathogens) panel by PCR  (Respiratory panel by PCR (~20 pathogens, ~24 hr TAT)  w precautions)  Once,   R        11/13/20 1244            Vitals/Pain Today's Vitals   11/13/20 0605 11/13/20 0903 11/13/20 1020 11/13/20 1215  BP: (!) 166/68 (!) 151/64 (!) 151/54 (!) 144/52  Pulse: 79 86 77 96  Resp: 19 19 (!) 23 (!) 30  Temp:      TempSrc:      SpO2: 99% 96% 96% 96%  Weight:      Height:      PainSc:        Isolation Precautions Droplet precaution  Medications Medications  ipratropium-albuterol (DUONEB) 0.5-2.5 (3) MG/3ML nebulizer solution 3 mL (has no administration in time range)  ipratropium-albuterol (DUONEB) 0.5-2.5 (3) MG/3ML nebulizer solution 3 mL (3 mLs Nebulization Given 11/13/20 1338)  dextromethorphan-guaiFENesin (MUCINEX DM) 30-600 MG per 12 hr tablet 1 tablet (1 tablet Oral Given  11/13/20 0954)  methylPREDNISolone sodium succinate (SOLU-MEDROL) 40 mg/mL injection 40 mg (40 mg Intravenous Given 11/13/20 0344)  pantoprazole (PROTONIX) EC tablet 40 mg (40 mg Oral Given 11/13/20 0954)  amLODipine (NORVASC) tablet 2.5 mg (has no administration in time range)  ezetimibe (ZETIA) tablet 10 mg (10 mg Oral Given 11/13/20 1022)  nitroGLYCERIN (NITROSTAT) SL tablet 0.4 mg (has no administration in time range)  levothyroxine (SYNTHROID) tablet 50 mcg (has no administration in time range)  famotidine (PEPCID) tablet 10 mg (has no administration in time range)  apixaban (ELIQUIS) tablet 5 mg (5 mg Oral Given 11/13/20 1019)  cholecalciferol (VITAMIN D3) tablet 1,000 Units (1,000 Units Oral Given 11/13/20 1019)  amiodarone (PACERONE) tablet 200 mg (200 mg Oral Given 11/13/20 1019)  clopidogrel (PLAVIX) tablet 75 mg (75 mg Oral Given 11/13/20 1019)  benzonatate (TESSALON) capsule 100 mg (100 mg Oral Given 11/13/20 1332)  albuterol (PROVENTIL) (2.5 MG/3ML) 0.083% nebulizer solution 5 mg (5 mg Nebulization Given 11/12/20 2259)  ipratropium (ATROVENT) nebulizer solution 0.5 mg (0.5 mg Nebulization Given 11/12/20 2259)  furosemide (LASIX) injection 20 mg (20 mg Intravenous Given 11/13/20 1325)    Mobility walks with person assist     Focused Assessments Pulmonary Assessment Handoff:  Lung sounds:   O2 Device: Room Air      R Recommendations: See Admitting Provider Note  Report given to:   Additional Notes: none

## 2020-11-13 NOTE — ED Notes (Signed)
Pt sitting up on side of bed. Call bell within reach. Pt denies any complaints. No acute changes noted. Will continue to monitor.

## 2020-11-13 NOTE — ED Notes (Signed)
Called 3E at this time to let them no there was no purple man placed

## 2020-11-14 ENCOUNTER — Inpatient Hospital Stay (HOSPITAL_COMMUNITY): Payer: Medicare Other

## 2020-11-14 LAB — BASIC METABOLIC PANEL
Anion gap: 9 (ref 5–15)
BUN: 18 mg/dL (ref 8–23)
CO2: 26 mmol/L (ref 22–32)
Calcium: 10 mg/dL (ref 8.9–10.3)
Chloride: 98 mmol/L (ref 98–111)
Creatinine, Ser: 1.02 mg/dL — ABNORMAL HIGH (ref 0.44–1.00)
GFR, Estimated: 53 mL/min — ABNORMAL LOW (ref >60)
Glucose, Bld: 141 mg/dL — ABNORMAL HIGH (ref 70–99)
Potassium: 5 mmol/L (ref 3.5–5.1)
Sodium: 133 mmol/L — ABNORMAL LOW (ref 135–145)

## 2020-11-14 LAB — PROCALCITONIN: Procalcitonin: <0.1 ng/mL

## 2020-11-14 LAB — MAGNESIUM: Magnesium: 1.8 mg/dL (ref 1.7–2.4)

## 2020-11-14 MED ORDER — METHYLPREDNISOLONE SODIUM SUCC 125 MG IJ SOLR
80.0000 mg | Freq: Two times a day (BID) | INTRAMUSCULAR | Status: DC
  Administered 2020-11-14 – 2020-11-17 (×6): 80 mg via INTRAVENOUS
  Filled 2020-11-14 (×6): qty 2

## 2020-11-14 MED ORDER — SODIUM CHLORIDE 3 % IN NEBU
4.0000 mL | INHALATION_SOLUTION | Freq: Every day | RESPIRATORY_TRACT | Status: AC
  Administered 2020-11-15 – 2020-11-16 (×2): 4 mL via RESPIRATORY_TRACT
  Filled 2020-11-14 (×3): qty 4

## 2020-11-14 NOTE — TOC Initial Note (Signed)
Transition of Care [facility]) - Initial/Assessment Note    Patient Details  Name: Heather [name] MRN: [id number] Date of Birth: Aug 10, 1930  [facility] CM/SW Contact:    Verdell Carmine, RN Phone Number: 11/14/2020, 4:47 PM  Clinical Narrative:                 87 year old in with CHF, history of CABG, received lasix IV.   Lives at home by self with daughter living next door. PT evaluation revealed no follow up services needed at this time. CM will follow for needs recommendations, and transitions  Expected Discharge Plan: Home/Self Care Barriers to Discharge: Continued Medical Work up   Patient Goals and CMS Choice        Expected Discharge Plan and Services Expected Discharge Plan: Home/Self Care       Living arrangements for the past 2 months: Single Family Home                                      Prior Living Arrangements/Services Living arrangements for the past 2 months: Single Family Home Lives with:: Self Patient language and need for interpreter reviewed:: Yes        Need for Family Participation in Patient Care: Yes (Comment) Care giver support system in place?: Yes (comment)   Criminal Activity/Legal Involvement Pertinent to Current Situation/Hospitalization: No - Comment as needed  Activities of Daily Living Home Assistive Devices/Equipment: Walker (specify type) (rollator and 2 wheel walker) ADL Screening (condition at time of admission) Patient's cognitive ability adequate to safely complete daily activities?: Yes Is the patient deaf or have difficulty hearing?: No Does the patient have difficulty seeing, even when wearing glasses/contacts?: No Does the patient have difficulty concentrating, remembering, or making decisions?: No Patient able to express need for assistance with ADLs?: Yes Does the patient have difficulty dressing or bathing?: No Independently performs ADLs?: Yes (appropriate for developmental age) Does the patient  have difficulty walking or climbing stairs?: Yes Weakness of Legs: Both Weakness of Arms/Hands: None  Permission Sought/Granted                  Emotional Assessment       Orientation: : Oriented to Self, Oriented to Place, Oriented to  Time, Oriented to Situation Alcohol / Substance Use: Not Applicable Psych Involvement: No (comment)  Admission diagnosis:  Shortness of breath [R06.02] SOB (shortness of breath) [R06.02] Dyspnea, unspecified type [R06.00] Patient Active Problem List   Diagnosis Date Noted   Elevated brain natriuretic peptide (BNP) level 11/13/2020   Prolonged QT interval 11/13/2020   Atrial fibrillation, chronic ([place]) 11/13/2020   SOB (shortness of breath) 11/13/2020   Shortness of breath 11/12/2020   Pneumonia    History of kidney stones    Diverticulitis    Arthritis of left hip 03/16/2020   Status post total replacement of left hip 03/16/2020   Hypertension    GERD (gastroesophageal reflux disease)    Coronary artery disease    Chronic diastolic CHF (congestive heart failure) ([place])    Arthritis    Pacemaker 09/15/2019   Presence of permanent cardiac pacemaker 08/22/2019   Sinus node dysfunction ([place])    Chest pain 08/20/2019   Elevated troponin    Dyspnea 01/03/2016   Anxiety attack 01/03/2016   PAF (paroxysmal atrial fibrillation) ([place]) 01/03/2016   Aphasia    Other hyperlipidemia  NSTEMI (non-ST elevated myocardial infarction) ([place]) 04/30/2015   Hypertension, uncontrolled 07/19/2014   H/O mitral valve repair    CAD (coronary artery disease) of artery bypass graft, s/p PCI stent to VG->RCA 04/30/15 07/14/2013   Obesity (BMI 30-39.9) 07/14/2013   Hyperlipidemia 07/14/2013   Hypothyroidism 07/14/2013   Hypertensive heart disease 07/14/2013   CAD (coronary artery disease), native coronary artery 07/14/2013   S/P partial hysterectomy 1977   PCP:  Leonard Downing, MD Pharmacy:   [facility] [address], [place] [address] Phone: [phone] Fax: [place], [place] - [phone] [place] [address] Phone: [phone] Fax: [phone]     Social Determinants of Health (SDOH) Interventions    Readmission Risk Interventions No flowsheet data found.

## 2020-11-14 NOTE — Progress Notes (Signed)
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 , hypothyroidism on Synthroid supplement , history of CAD status post CABG, history of mitral valve repair , h/o PAF/SSS status post pacemaker placement on amiodarone and Eliquis , presents with cough, sob, wheezing  Subjective:  Continue having coughing spells, cough is dry,  not able to cough up, continue to wheeze denies chest pain,   edema in legs has resolved Daughter at bedside  Assessment & Plan:   Principal Problem:   Shortness of breath Active Problems:   CAD (coronary artery disease) of artery bypass graft, s/p PCI stent to VG->RCA 04/30/15   Obesity (BMI 30-39.9)   Hyperlipidemia   Hypothyroidism   Hypertensive heart disease   GERD (gastroesophageal reflux disease)   Elevated brain natriuretic peptide (BNP) level   Prolonged QT interval   Atrial fibrillation, chronic (HCC)   SOB (shortness of breath)  Reactive airway disease, denies h/o asthma or COPD -Presents with significant coughing spells, Shortness of breath, dyspnea, denies sick contact -diffuse rhonchi/rales/scattered wheezing bilateral lung exam - no documented hypoxia -Patient reports cough lingered after COVID infection in July this year -Respiratory viral panel negative -CT chest showed multiple small nodules, 30mm, no acute infiltrate -cough is nonproductive -case discussed with pulmonology Dr Vaughan Browner who donot think ct chest changes is related to  amiodarone or post covid changes. Dr Vaughan Browner recommend continue steroids/nebs -will increase steroids, add hypertonic saline nebs  H/o diastolic chf, she does not take lasix - respiratory symptom from ?Possible acute on chronic diastolic CHF?, History of mitral valve  repair -She does has bilateral lower extremity edema on exam, s/p iv lasix 20mg  x1 on 10/29 with 2.3liter urine output -edema resolved, I think she is dry today, with slight hyponatremia and slight increase of cr, but coughing and wheezing persists -echo ordered on admission, still pending   PAF/SSS status post pacemaker placement on amiodarone and Eliquis  Advised patient to discuss with her cardiologist on outpatient basis regarding long-term use of amiodarone ( due to her h/o thyroids issues and lung issues)    CAD status post CABG History of mitral valve repair -Denies chest pain -Continue home regimen Plavix, diarrhea  HTN; on norvasc   Hypothyroidism, on Synthroid supplement TSH unremarkable in May/2022 Remote h/oleft-sided thyroidectomy  right-sided thyromegaly , not amenable to resection per ENT     Body mass index is 35.59 kg/m.Marland Kitchen        Unresulted Labs (From admission, onward)     Start     Ordered   11/14/20 0500  Procalcitonin  Daily,   R      11/13/20 1245   11/13/20 1549  Expectorated Sputum Assessment w Gram Stain, Rflx to Resp Cult  Once,   R        11/13/20 1548              DVT prophylaxis: SCDs Start: 11/13/20 0049 apixaban (ELIQUIS) tablet 5 mg   Code Status: DNR, verified with patient Family Communication: daughter at bedside daily Disposition:   Status is: Inpatient  Dispo: The patient is from: Home, lives by herself, daughter lives next door              Anticipated d/c is to: Home  Anticipated d/c date is: To be determined                Consultants:  Phone conversation with pulmonology Dr. Vaughan Browner  Procedures:  None  Antimicrobials:   Anti-infectives (From admission, onward)    None           Objective: Vitals:   11/14/20 0259 11/14/20 0512 11/14/20 0842 11/14/20 1212  BP:  (!) 95/53  (!) 143/54  Pulse:  75  82  Resp:  20  20  Temp:  (!) 97.5 F (36.4 C)  (!) 97.5 F (36.4 C)  TempSrc:  Oral   Oral  SpO2: 95% 95% 95% 98%  Weight:  79.9 kg    Height:        Intake/Output Summary (Last 24 hours) at 11/14/2020 1704 Last data filed at 11/14/2020 1456 Gross per 24 hour  Intake 1080 ml  Output 2850 ml  Net -1770 ml   Filed Weights   11/12/20 1641 11/14/20 0512  Weight: 80.7 kg 79.9 kg    Examination:  General exam: alert, awake, communicative, + coughing spells Respiratory system: bilateral diffuse rhonchi, rales, wheezing, slight tachypnea.  Cardiovascular system:  RRR.  Gastrointestinal system: Abdomen is nondistended, soft and nontender.  Normal bowel sounds heard. Central nervous system: Alert and oriented. No focal neurological deficits. Extremities:  trace bilateral lower extremity edema has resolved Skin: No rashes, lesions or ulcers Psychiatry: Judgement and insight appear normal. Mood & affect appropriate.     Data Reviewed: I have personally reviewed following labs and imaging studies  CBC: Recent Labs  Lab 11/12/20 1737 11/13/20 0539  WBC 8.3 8.9  HGB 13.2 12.2  HCT 40.3 36.8  MCV 92.2 91.5  PLT 229 169    Basic Metabolic Panel: Recent Labs  Lab 11/12/20 1737 11/13/20 0539 11/14/20 0610  NA 139 135 133*  K 4.0 3.9 5.0  CL 103 103 98  CO2 25 22 26   GLUCOSE 101* 137* 141*  BUN 16 16 18   CREATININE 0.96 0.92 1.02*  CALCIUM 10.0 9.3 10.0  MG  --  2.0 1.8  PHOS  --  3.8  --     GFR: Estimated Creatinine Clearance: 34.8 mL/min (A) (by C-G formula based on SCr of 1.02 mg/dL (H)).  Liver Function Tests: Recent Labs  Lab 11/12/20 1737 11/13/20 0539  AST 25 25  ALT 26 22  ALKPHOS 59 56  BILITOT 1.1 1.1  PROT 7.4 6.8  ALBUMIN 4.5 4.1    CBG: No results for input(s): GLUCAP in the last 168 hours.   Recent Results (from the past 240 hour(s))  Resp Panel by RT-PCR (Flu A&B, Covid) Nasopharyngeal Swab     Status: None   Collection Time: 11/13/20 12:40 AM   Specimen: Nasopharyngeal Swab; Nasopharyngeal(NP) swabs in vial transport  medium  Result Value Ref Range Status   SARS Coronavirus 2 by RT PCR NEGATIVE NEGATIVE Final    Comment: (NOTE) SARS-CoV-2 target nucleic acids are NOT DETECTED.  The SARS-CoV-2 RNA is generally detectable in upper respiratory specimens during the acute phase of infection. The lowest concentration of SARS-CoV-2 viral copies this assay can detect is 138 copies/mL. A negative result does not preclude SARS-Cov-2 infection and should not be used as the sole basis for treatment or other patient management decisions. A negative result may occur with  improper specimen collection/handling, submission of specimen other than nasopharyngeal swab, presence of viral mutation(s) within the areas targeted by this assay, and inadequate number of  viral copies(<138 copies/mL). A negative result must be combined with clinical observations, patient history, and epidemiological information. The expected result is Negative.  Fact Sheet for Patients:  EntrepreneurPulse.com.au  Fact Sheet for Healthcare Providers:  IncredibleEmployment.be  This test is no t yet approved or cleared by the [place] FDA and  has been authorized for detection and/or diagnosis of SARS-CoV-2 by FDA under an Emergency Use Authorization (EUA). This EUA will remain  in effect (meaning this test can be used) for the duration of the COVID-19 declaration under Section 564(b)(1) of the Act, 21 U.S.C.section 360bbb-3(b)(1), unless the authorization is terminated  or revoked sooner.       Influenza A by PCR NEGATIVE NEGATIVE Final   Influenza B by PCR NEGATIVE NEGATIVE Final    Comment: (NOTE) The Xpert Xpress SARS-CoV-2/FLU/RSV plus assay is intended as an aid in the diagnosis of influenza from Nasopharyngeal swab specimens and should not be used as a sole basis for treatment. Nasal washings and aspirates are unacceptable for Xpert Xpress SARS-CoV-2/FLU/RSV testing.  Fact Sheet for  Patients: EntrepreneurPulse.com.au  Fact Sheet for Healthcare Providers: IncredibleEmployment.be  This test is not yet approved or cleared by the [place] FDA and has been authorized for detection and/or diagnosis of SARS-CoV-2 by FDA under an Emergency Use Authorization (EUA). This EUA will remain in effect (meaning this test can be used) for the duration of the COVID-19 declaration under Section 564(b)(1) of the Act, 21 U.S.C. section 360bbb-3(b)(1), unless the authorization is terminated or revoked.  Performed at [facility], [place] [address], [place], [place] [address]   Respiratory (~20 pathogens) panel by PCR     Status: None   Collection Time: 11/13/20 12:45 PM   Specimen: Nasopharyngeal Swab; Respiratory  Result Value Ref Range Status   Adenovirus NOT DETECTED NOT DETECTED Final   Coronavirus 229E NOT DETECTED NOT DETECTED Final    Comment: (NOTE) The Coronavirus on the Respiratory Panel, DOES NOT test for the novel  Coronavirus (2019 nCoV)    Coronavirus HKU1 NOT DETECTED NOT DETECTED Final   Coronavirus NL63 NOT DETECTED NOT DETECTED Final   Coronavirus OC43 NOT DETECTED NOT DETECTED Final   Metapneumovirus NOT DETECTED NOT DETECTED Final   Rhinovirus / Enterovirus NOT DETECTED NOT DETECTED Final   Influenza A NOT DETECTED NOT DETECTED Final   Influenza B NOT DETECTED NOT DETECTED Final   Parainfluenza Virus 1 NOT DETECTED NOT DETECTED Final   Parainfluenza Virus 2 NOT DETECTED NOT DETECTED Final   Parainfluenza Virus 3 NOT DETECTED NOT DETECTED Final   Parainfluenza Virus 4 NOT DETECTED NOT DETECTED Final   Respiratory Syncytial Virus NOT DETECTED NOT DETECTED Final   Bordetella pertussis NOT DETECTED NOT DETECTED Final   Bordetella Parapertussis NOT DETECTED NOT DETECTED Final   Chlamydophila pneumoniae NOT DETECTED NOT DETECTED Final   Mycoplasma pneumoniae NOT DETECTED NOT DETECTED Final    Comment: Performed at  [facility], [place]. [address] [place], [place] [address]         Radiology Studies: CT CHEST WO CONTRAST  Result Date: 11/13/2020 CLINICAL DATA:  Pneumonia. EXAM: CT CHEST WITHOUT CONTRAST TECHNIQUE: Multidetector CT imaging of the chest was performed following the standard protocol without IV contrast. COMPARISON:  June 16, 2013. FINDINGS: Cardiovascular: Status post coronary artery bypass graft. Atherosclerosis of thoracic aorta is noted. Mild cardiomegaly is noted. No pericardial effusion is noted. Mediastinum/Nodes: Stable right-sided thyromegaly is noted. No adenopathy is noted. The esophagus is unremarkable. Lungs/Pleura: No  pneumothorax or pleural effusion is noted. Multiple small bilateral pulmonary nodules are noted, including 3 mm nodule seen anteriorly in right lower lobe best seen on image number 87 of series 8 which is unchanged compared to prior exam. 3 mm nodule is noted in left lower lobe best seen on image number 86 of series 8 which is not visualized on prior exam. 3 mm nodule is noted laterally in left lower lobe best seen on image number 79 of series 8 which is not clearly visualized on prior exam. 3 mm nodule is noted in left lower lobe best seen on image number 61 of series 8 which is not seen on prior exam. No significant consolidation is noted. Upper Abdomen: No acute abnormality. Musculoskeletal: No chest wall mass or suspicious bone lesions identified. IMPRESSION: No definite evidence of consolidation is noted. Multiple small bilateral pulmonary nodules are noted, the largest measuring 3 mm. Several of these are not visualized on prior exam. No follow-up needed if patient is low-risk (and has no known or suspected primary neoplasm). Non-contrast chest CT can be considered in 12 months if patient is high-risk. This recommendation follows the consensus statement: Guidelines for Management of Incidental Pulmonary Nodules Detected on CT Images: From the Fleischner Society 2017;  Radiology 2017; 284:228-243. Status post coronary bypass graft. Aortic Atherosclerosis (ICD10-I70.0). Electronically Signed   By: Marijo Conception M.D.   On: 11/13/2020 13:23   DG Chest Portable 1 View  Result Date: 11/12/2020 CLINICAL DATA:  Shortness of breath EXAM: PORTABLE CHEST 1 VIEW COMPARISON:  08/23/2019, 08/20/2019 FINDINGS: Post sternotomy changes and left-sided pacing device as before. Cardiomegaly. No pleural effusion, pneumothorax or focal airspace disease. Aortic atherosclerosis. IMPRESSION: Cardiomegaly without edema or infiltrate Electronically Signed   By: Donavan Foil M.D.   On: 11/12/2020 18:06        Scheduled Meds:  amiodarone  200 mg Oral Daily   amLODipine  2.5 mg Oral QHS   apixaban  5 mg Oral BID   cholecalciferol  1,000 Units Oral Daily   clopidogrel  75 mg Oral Daily   dextromethorphan-guaiFENesin  1 tablet Oral BID   ezetimibe  10 mg Oral Daily   levothyroxine  50 mcg Oral QAC breakfast   methylPREDNISolone (SOLU-MEDROL) injection  80 mg Intravenous Q12H   pantoprazole  40 mg Oral Daily   sodium chloride HYPERTONIC  4 mL Nebulization Daily   Continuous Infusions:   LOS: 1 day   Time spent: 37mins Greater than 50% of this time was spent in counseling, explanation of diagnosis, planning of further management, and coordination of care.   Voice Recognition Viviann Spare dictation system was used to create this note, attempts have been made to correct errors. Please contact the author with questions and/or clarifications.   Florencia Reasons, MD PhD FACP [facility]  Available via Epic secure chat 7am-7pm for nonurgent issues Please page for urgent issues To page the attending provider between 7A-7P or the covering provider during after hours 7P-7A, please log into the web site [email or web address] and access using universal [place] password for that web site. If you do not have the password, please call the hospital operator.    11/14/2020, 5:04 PM

## 2020-11-14 NOTE — Evaluation (Addendum)
Physical Therapy Evaluation Patient Details Name: ANETA HENDERSHOTT MRN: [id number] DOB: [birth year] Today's Date: 11/14/2020  History of Present Illness  Pt is an 87 y/o female admitted from home secondary to SOB, possibly secondary to undiagnosed asthma/COPD and/or CHF. PMH including but not limited to multivessel CAD s/p CABG and PCI , HTN, history of mitral valve repair, hypothyroidism, chronic diastolic CHF, GERD, hyperlipidemia, A. fib on Eliquis.   Clinical Impression  Pt presented supine in bed with HOB elevated, awake and willing to participate in therapy session. Prior to admission, pt reported that she was independent with ADLs/IADLs and used a rollator for ambulation. Pt lives alone in a single level home with a ramped entrance. At the time of evaluation, pt overall at a supervision to min guard level with mobility including hallway ambulation with use of RW. She had no LOB throughout. Pt would continue to benefit from skilled physical therapy services at this time while admitted to address the below listed limitations in order to improve overall safety and independence with functional mobility.      Recommendations for follow up therapy are one component of a multi-disciplinary discharge planning process, led by the attending physician.  Recommendations may be updated based on patient status, additional functional criteria and insurance authorization.  Follow Up Recommendations No PT follow up    Assistance Recommended at Discharge PRN  Functional Status Assessment Patient has had a recent decline in their functional status and demonstrates the ability to make significant improvements in function in a reasonable and predictable amount of time.  Equipment Recommendations  None recommended by PT    Recommendations for Other Services       Precautions / Restrictions Precautions Precautions: Fall Restrictions Weight Bearing Restrictions: No      Mobility  Bed Mobility Overal  bed mobility: Needs Assistance Bed Mobility: Supine to Sit     Supine to sit: Supervision     General bed mobility comments: for safety    Transfers Overall transfer level: Needs assistance Equipment used: Rolling walker (2 wheels) Transfers: Sit to/from Stand Sit to Stand: Supervision           General transfer comment: good technique utilized, steady overall with transitional movement    Ambulation/Gait Ambulation/Gait assistance: Min guard Gait Distance (Feet): 100 Feet Assistive device: Rolling walker (2 wheels) Gait Pattern/deviations: Step-through pattern;Decreased stride length Gait velocity: decreased   General Gait Details: pt steady with use of RW, no instability or overt LOB  Stairs            Wheelchair Mobility    Modified Rankin (Stroke Patients Only)       Balance Overall balance assessment: Needs assistance Sitting-balance support: Feet supported Sitting balance-Leahy Scale: Good     Standing balance support: Single extremity supported Standing balance-Leahy Scale: Poor                               Pertinent Vitals/Pain Pain Assessment: No/denies pain    Home Living Family/patient expects to be discharged to:: Private residence Living Arrangements: Alone Available Help at Discharge: Family;Available 24 hours/day Type of Home: House Home Access: Ramped entrance       Home Layout: One level Home Equipment: Air cabin crew (4 wheels)      Prior Function Prior Level of Function : Independent/Modified Independent;Driving             Mobility Comments: pt ambulates with use of a rollator  Hand Dominance        Extremity/Trunk Assessment   Upper Extremity Assessment Upper Extremity Assessment: Generalized weakness    Lower Extremity Assessment Lower Extremity Assessment: Generalized weakness       Communication   Communication: No difficulties  Cognition Arousal/Alertness:  Awake/alert Behavior During Therapy: WFL for tasks assessed/performed Overall Cognitive Status: Within Functional Limits for tasks assessed                                          General Comments      Exercises     Assessment/Plan    PT Assessment Patient needs continued PT services  PT Problem List Decreased mobility;Cardiopulmonary status limiting activity       PT Treatment Interventions DME instruction;Gait training;Stair training;Functional mobility training;Therapeutic activities;Therapeutic exercise;Balance training;Neuromuscular re-education;Patient/family education    PT Goals (Current goals can be found in the Care Plan section)  Acute Rehab PT Goals Patient Stated Goal: return home PT Goal Formulation: With patient Time For Goal Achievement: 11/28/20 Potential to Achieve Goals: Good    Frequency Min 3X/week   Barriers to discharge        Co-evaluation               AM-PAC PT "6 Clicks" Mobility  Outcome Measure Help needed turning from your back to your side while in a flat bed without using bedrails?: None Help needed moving from lying on your back to sitting on the side of a flat bed without using bedrails?: None Help needed moving to and from a bed to a chair (including a wheelchair)?: None Help needed standing up from a chair using your arms (e.g., wheelchair or bedside chair)?: None Help needed to walk in hospital room?: None Help needed climbing 3-5 steps with a railing? : A Little 6 Click Score: 23    End of Session Equipment Utilized During Treatment: Gait belt Activity Tolerance: Patient tolerated treatment well Patient left: in chair;with call bell/phone within reach;with chair alarm set Nurse Communication: Mobility status PT Visit Diagnosis: Other abnormalities of gait and mobility (R26.89)    Time: 1610-9604 PT Time Calculation (min) (ACUTE ONLY): 34 min   Charges:   PT Evaluation $PT Eval Moderate Complexity:  1 Mod PT Treatments $Gait Training: 8-22 mins        Anastasio Champion, DPT  Acute Rehabilitation Services Office [place] 11/14/2020, 9:45 AM

## 2020-11-15 ENCOUNTER — Inpatient Hospital Stay (HOSPITAL_COMMUNITY): Payer: Medicare Other

## 2020-11-15 DIAGNOSIS — R1319 Other dysphagia: Secondary | ICD-10-CM

## 2020-11-15 DIAGNOSIS — R051 Acute cough: Secondary | ICD-10-CM

## 2020-11-15 DIAGNOSIS — I5023 Acute on chronic systolic (congestive) heart failure: Secondary | ICD-10-CM

## 2020-11-15 DIAGNOSIS — I25810 Atherosclerosis of coronary artery bypass graft(s) without angina pectoris: Secondary | ICD-10-CM

## 2020-11-15 DIAGNOSIS — I272 Pulmonary hypertension, unspecified: Secondary | ICD-10-CM

## 2020-11-15 DIAGNOSIS — I5032 Chronic diastolic (congestive) heart failure: Secondary | ICD-10-CM

## 2020-11-15 DIAGNOSIS — I110 Hypertensive heart disease with heart failure: Principal | ICD-10-CM

## 2020-11-15 DIAGNOSIS — R0603 Acute respiratory distress: Secondary | ICD-10-CM

## 2020-11-15 LAB — ECHOCARDIOGRAM COMPLETE
AV Mean grad: 7 mmHg
AV Peak grad: 14.1 mmHg
Ao pk vel: 1.88 m/s
Area-P 1/2: 2.88 cm2
Height: 59 in
S' Lateral: 3.2 cm
Weight: 2801.6 oz

## 2020-11-15 LAB — BASIC METABOLIC PANEL WITH GFR
Anion gap: 8 (ref 5–15)
BUN: 24 mg/dL — ABNORMAL HIGH (ref 8–23)
CO2: 25 mmol/L (ref 22–32)
Calcium: 10.1 mg/dL (ref 8.9–10.3)
Chloride: 99 mmol/L (ref 98–111)
Creatinine, Ser: 1.07 mg/dL — ABNORMAL HIGH (ref 0.44–1.00)
GFR, Estimated: 50 mL/min — ABNORMAL LOW
Glucose, Bld: 132 mg/dL — ABNORMAL HIGH (ref 70–99)
Potassium: 4.9 mmol/L (ref 3.5–5.1)
Sodium: 132 mmol/L — ABNORMAL LOW (ref 135–145)

## 2020-11-15 LAB — PROCALCITONIN: Procalcitonin: <0.1 ng/mL

## 2020-11-15 LAB — MAGNESIUM: Magnesium: 2.2 mg/dL (ref 1.7–2.4)

## 2020-11-15 MED ORDER — REVEFENACIN 175 MCG/3ML IN SOLN
175.0000 ug | Freq: Every day | RESPIRATORY_TRACT | Status: DC
  Administered 2020-11-15 – 2020-11-18 (×4): 175 ug via RESPIRATORY_TRACT
  Filled 2020-11-15 (×4): qty 3

## 2020-11-15 MED ORDER — PANTOPRAZOLE SODIUM 40 MG PO TBEC
40.0000 mg | DELAYED_RELEASE_TABLET | Freq: Two times a day (BID) | ORAL | Status: DC
  Administered 2020-11-15 – 2020-11-18 (×7): 40 mg via ORAL
  Filled 2020-11-15 (×7): qty 1

## 2020-11-15 MED ORDER — SENNOSIDES-DOCUSATE SODIUM 8.6-50 MG PO TABS: 1.0000 | ORAL_TABLET | Freq: Two times a day (BID) | ORAL | Status: DC | PRN

## 2020-11-15 MED ORDER — GUAIFENESIN ER 600 MG PO TB12
600.0000 mg | ORAL_TABLET | Freq: Two times a day (BID) | ORAL | Status: DC
  Administered 2020-11-15 – 2020-11-16 (×4): 600 mg via ORAL
  Filled 2020-11-15 (×4): qty 1

## 2020-11-15 MED ORDER — LORATADINE 10 MG PO TABS
10.0000 mg | ORAL_TABLET | Freq: Every day | ORAL | Status: DC
  Administered 2020-11-15 – 2020-11-18 (×4): 10 mg via ORAL
  Filled 2020-11-15 (×4): qty 1

## 2020-11-15 MED ORDER — POLYETHYLENE GLYCOL 3350 17 G PO PACK: 17.0000 g | PACK | Freq: Two times a day (BID) | ORAL | Status: DC | PRN

## 2020-11-15 MED ORDER — BUDESONIDE 0.5 MG/2ML IN SUSP
0.5000 mg | Freq: Two times a day (BID) | RESPIRATORY_TRACT | Status: DC
  Administered 2020-11-15 – 2020-11-18 (×7): 0.5 mg via RESPIRATORY_TRACT
  Filled 2020-11-15 (×7): qty 2

## 2020-11-15 MED ORDER — ARFORMOTEROL TARTRATE 15 MCG/2ML IN NEBU
15.0000 ug | INHALATION_SOLUTION | Freq: Two times a day (BID) | RESPIRATORY_TRACT | Status: DC
  Administered 2020-11-15 – 2020-11-18 (×7): 15 ug via RESPIRATORY_TRACT
  Filled 2020-11-15 (×7): qty 2

## 2020-11-15 MED ORDER — GUAIFENESIN-DM 100-10 MG/5ML PO SYRP
5.0000 mL | ORAL_SOLUTION | ORAL | Status: DC | PRN
  Administered 2020-11-15 – 2020-11-18 (×9): 5 mL via ORAL
  Filled 2020-11-15 (×9): qty 5

## 2020-11-15 MED ORDER — DOCUSATE SODIUM 100 MG PO CAPS
100.0000 mg | ORAL_CAPSULE | Freq: Two times a day (BID) | ORAL | Status: DC
  Administered 2020-11-15 – 2020-11-18 (×6): 100 mg via ORAL
  Filled 2020-11-15 (×7): qty 1

## 2020-11-15 MED ORDER — ALUM & MAG HYDROXIDE-SIMETH 200-200-20 MG/5ML PO SUSP: 30.0000 mL | Freq: Three times a day (TID) | ORAL | Status: DC | PRN

## 2020-11-15 NOTE — Progress Notes (Signed)
PROGRESS NOTE  Heather [name] IOE:[id number] DOB: 1930-06-26   PCP: Leonard Downing, MD  Patient is from: Home.  DOA: 11/12/2020 LOS: 2  Chief complaints:  Chief Complaint  Patient presents with   Shortness of Breath     Brief Narrative / Interim history: 87 year old F with PMH of CAD/CABG in 2003 and PCI stent to VG> RCA in 01/8297, diastolic CHF, MV repair in 2003, SSS/PPM, PAF on Eliquis, left thyroidectomy in 1968, right thyromegaly, hypothyroidism, HTN, prolonged QT, GERD and COVID-19 infection in 07/2020 presenting with shortness of breath, dry cough, orthopnea and lower extremity edema for few days and admitted with working diagnosis of possibly undiagnosed asthma/COPD and possible CHF exacerbation.  ED work-up unrevealing except for slightly elevated BNP to 135.  CXR showed cardiomegaly without edema or infiltrate.  CT chest with multiple small nodules with the largest about 3 mm but no other significant finding.  Started on steroid, nebulizers, IV Lasix without significant improvement in the symptoms.  COVID-19 and full RVP negative.  TTE with LVEF of 60 to 65%, mild concentric LVH, focal hypokinesis basal inferior wall, moderate mitral stenosis, and RVSP of 51.6 mmHg.    Subjective: Seen and examined earlier this morning.  She reports feeling miserable from cough.  Cough is mainly dry.  She reports chronic orthopnea unchanged from baseline.  Edema has improved after IV Lasix.  She reports history of acid reflux but did not need medication in the last 6 months.  She also reports allergies in fall season.  Denies chest pain except when she coughs.  Denies GI or UTI symptoms.  Objective: Vitals:   11/15/20 0847 11/15/20 0850 11/15/20 1120 11/15/20 1129  BP:    (!) 135/42  Pulse:    86  Resp:    16  Temp:    97.7 F (36.5 C)  TempSrc:    Oral  SpO2: 99% 98% 98% 96%  Weight:      Height:        Intake/Output Summary (Last 24 hours) at 11/15/2020 1207 Last data filed at  11/15/2020 3716 Gross per 24 hour  Intake 560 ml  Output 2251 ml  Net -1691 ml   Filed Weights   11/12/20 1641 11/14/20 0512 11/15/20 0520  Weight: 80.7 kg 79.9 kg 79.4 kg    Examination:  GENERAL: No apparent distress.  Nontoxic. HEENT: MMM.  Vision and hearing grossly intact.  NECK: Supple.  No apparent JVD.  RESP: 96% on RA.  Persistently coughing.  End expiratory wheeze CVS:  RRR. Heart sounds normal.  ABD/GI/GU: BS+. Abd soft, NTND.  MSK/EXT:  Moves extremities. No apparent deformity. No edema.  SKIN: no apparent skin lesion or wound NEURO: Awake, alert and oriented appropriately.  No apparent focal neuro deficit. PSYCH: Calm. Normal affect.   Procedures:  None  Microbiology summarized: RCVEL-38 and influenza PCR nonreactive. Full RVP panel negative.  Assessment & Plan: Respiratory distress from progressive dyspnea and dry cough-no history of asthma or COPD.  Briefly smoked before 1960s.  Differential diagnosis include undiagnosed asthma, GERD, postnasal drip from seasonal allergy, involvement of phrenic nerve from right thyromegaly, amiodarone toxicity, residual from COVID-19 infection in 07/2020 or CHF with PAH.  COVID-19, influenza and full RVP negative.  CXR unrevealing.  BNP slightly elevated.  No pleuritic chest pain or tachycardia to suggest PE.  She has orthopnea unchanged from baseline.  Reportedly her edema does seem to have resolved.  BNP slightly elevated.  TTE as above.  CT chest  showed multiple small nodules with the largest about 3 mm but no acute infiltrate. -Previous attending discussed with pulmonology, Dr. Vaughan Browner who recommended continuing steroid and nebs.  Did not feel her symptoms are related to COVID or amiodarone. -Continue IV Solu-Medrol -Add Brovana, Pulmicort and Yupelri -Continue DuoNeb as needed -Scheduled Mucinex, Claritin, honey, with as needed Robitussin-DM -Check esophagram given history of GERD and esophageal dysphagia with solid -She  appears euvolemic and without need for diuretics -Increase Protonix to twice daily -Check CBC with differential to see if she has eosinophilia. -Repeat BNP and CXR in the morning.   Chronic diastolic CHF/portal hypertension/moderate mitral valve stenosis: History of mitral valve repair in 2003.  Has chronic orthopnea and mildly elevated BNP.  Appears euvolemic on exam.  She had IV Lasix 20 mg x 1 on 10/29 with good urine output.  TTE as above. -If no improvement in respiratory symptoms with the above intervention, will start trial of Lasix -Monitor fluid status.   SSS/PPM/paroxysmal A. Fib -Continue amiodarone and Eliquis  CAD/CABG in 2003 and PCI stent to VG> RCA in 04/2015: No anginal symptoms.  TTE as above.  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 -Continue Zetia  Essential hypertension: Normotensive. -Continue low-dose amlodipine  History of left thyroidectomy in 1968 History of right thyromegaly: reportedly not amenable to resection for right ENT. -Continue home Synthroid.  Esophageal dysphagia/GERD -Increase Protonix to 40 mg twice daily -Esophagram  Prolonged QTC: 527.  Likely exaggerated by wide QRS due to RBBB -Minimize or avoid QT prolonging drugs.   Class II obesity Body mass index is 35.37 kg/m.  -Encourage lifestyle change to lose weight.       DVT prophylaxis:  SCDs Start: 11/13/20 0049 apixaban (ELIQUIS) tablet 5 mg  Code Status: DNR/DNI Family Communication: Patient and/or RN. Available if any question.  Level of care: Telemetry Medical Status is: Inpatient  Remains inpatient appropriate because: Due to ongoing respiratory distress with persistent cough and dyspnea requiring IV Solu-Medrol, adjustment of medications, close monitoring and further evaluation       Consultants:  Pulmonology over the phone   Sch Meds:  Scheduled Meds:  amiodarone  200 mg Oral Daily   amLODipine  2.5 mg Oral QHS   apixaban  5 mg Oral BID    arformoterol  15 mcg Nebulization BID   budesonide (PULMICORT) nebulizer solution  0.5 mg Nebulization BID   cholecalciferol  1,000 Units Oral Daily   clopidogrel  75 mg Oral Daily   docusate sodium  100 mg Oral BID   ezetimibe  10 mg Oral Daily   guaiFENesin  600 mg Oral BID   levothyroxine  50 mcg Oral QAC breakfast   loratadine  10 mg Oral Daily   methylPREDNISolone (SOLU-MEDROL) injection  80 mg Intravenous Q12H   pantoprazole  40 mg Oral BID   revefenacin  175 mcg Nebulization Daily   sodium chloride HYPERTONIC  4 mL Nebulization Daily   Continuous Infusions: PRN Meds:.alum & mag hydroxide-simeth, guaiFENesin-dextromethorphan, ipratropium-albuterol, nitroGLYCERIN, polyethylene glycol, senna-docusate  Antimicrobials: Anti-infectives (From admission, onward)    None        I have personally reviewed the following labs and images: CBC: Recent Labs  Lab 11/12/20 1737 11/13/20 0539  WBC 8.3 8.9  HGB 13.2 12.2  HCT 40.3 36.8  MCV 92.2 91.5  PLT 229 195   BMP &GFR Recent Labs  Lab 11/12/20 1737 11/13/20 0539 11/14/20 0610 11/15/20 0302  NA 139 135 133* 132*  K  4.0 3.9 5.0 4.9  CL 103 103 98 99  CO2 25 22 26 25   GLUCOSE 101* 137* 141* 132*  BUN 16 16 18  24*  CREATININE 0.96 0.92 1.02* 1.07*  CALCIUM 10.0 9.3 10.0 10.1  MG  --  2.0 1.8 2.2  PHOS  --  3.8  --   --    Estimated Creatinine Clearance: 33.1 mL/min (A) (by C-G formula based on SCr of 1.07 mg/dL (H)). Liver & Pancreas: Recent Labs  Lab 11/12/20 1737 11/13/20 0539  AST 25 25  ALT 26 22  ALKPHOS 59 56  BILITOT 1.1 1.1  PROT 7.4 6.8  ALBUMIN 4.5 4.1   No results for input(s): LIPASE, AMYLASE in the last 168 hours. No results for input(s): AMMONIA in the last 168 hours. Diabetic: No results for input(s): HGBA1C in the last 72 hours. No results for input(s): GLUCAP in the last 168 hours. Cardiac Enzymes: No results for input(s): CKTOTAL, CKMB, CKMBINDEX, TROPONINI in the last 168 hours. No  results for input(s): PROBNP in the last 8760 hours. Coagulation Profile: Recent Labs  Lab 11/13/20 0539  INR 1.1   Thyroid Function Tests: No results for input(s): TSH, T4TOTAL, FREET4, T3FREE, THYROIDAB in the last 72 hours. Lipid Profile: No results for input(s): CHOL, HDL, LDLCALC, TRIG, CHOLHDL, LDLDIRECT in the last 72 hours. Anemia Panel: No results for input(s): VITAMINB12, FOLATE, FERRITIN, TIBC, IRON, RETICCTPCT in the last 72 hours. Urine analysis:    Component Value Date/Time   COLORURINE YELLOW 07/19/2014 1844   APPEARANCEUR CLOUDY (A) 07/19/2014 1844   LABSPEC 1.014 07/19/2014 1844   PHURINE 5.5 07/19/2014 1844   GLUCOSEU NEGATIVE 07/19/2014 1844   HGBUR SMALL (A) 07/19/2014 1844   BILIRUBINUR NEGATIVE 07/19/2014 1844   KETONESUR NEGATIVE 07/19/2014 1844   PROTEINUR NEGATIVE 07/19/2014 1844   UROBILINOGEN 0.2 07/19/2014 1844   NITRITE NEGATIVE 07/19/2014 1844   LEUKOCYTESUR NEGATIVE 07/19/2014 1844   Sepsis Labs: Invalid input(s): PROCALCITONIN, Sentinel  Microbiology: Recent Results (from the past 240 hour(s))  Resp Panel by RT-PCR (Flu A&B, Covid) Nasopharyngeal Swab     Status: None   Collection Time: 11/13/20 12:40 AM   Specimen: Nasopharyngeal Swab; Nasopharyngeal(NP) swabs in vial transport medium  Result Value Ref Range Status   SARS Coronavirus 2 by RT PCR NEGATIVE NEGATIVE Final    Comment: (NOTE) SARS-CoV-2 target nucleic acids are NOT DETECTED.  The SARS-CoV-2 RNA is generally detectable in upper respiratory specimens during the acute phase of infection. The lowest concentration of SARS-CoV-2 viral copies this assay can detect is 138 copies/mL. A negative result does not preclude SARS-Cov-2 infection and should not be used as the sole basis for treatment or other patient management decisions. A negative result may occur with  improper specimen collection/handling, submission of specimen other than nasopharyngeal swab, presence of viral  mutation(s) within the areas targeted by this assay, and inadequate number of viral copies(<138 copies/mL). A negative result must be combined with clinical observations, patient history, and epidemiological information. The expected result is Negative.  Fact Sheet for Patients:  EntrepreneurPulse.com.au  Fact Sheet for Healthcare Providers:  IncredibleEmployment.be  This test is no t yet approved or cleared by the [place] FDA and  has been authorized for detection and/or diagnosis of SARS-CoV-2 by FDA under an Emergency Use Authorization (EUA). This EUA will remain  in effect (meaning this test can be used) for the duration of the COVID-19 declaration under Section 564(b)(1) of the Act, 21 U.S.C.section 360bbb-3(b)(1), unless the authorization  is terminated  or revoked sooner.       Influenza A by PCR NEGATIVE NEGATIVE Final   Influenza B by PCR NEGATIVE NEGATIVE Final    Comment: (NOTE) The Xpert Xpress SARS-CoV-2/FLU/RSV plus assay is intended as an aid in the diagnosis of influenza from Nasopharyngeal swab specimens and should not be used as a sole basis for treatment. Nasal washings and aspirates are unacceptable for Xpert Xpress SARS-CoV-2/FLU/RSV testing.  Fact Sheet for Patients: EntrepreneurPulse.com.au  Fact Sheet for Healthcare Providers: IncredibleEmployment.be  This test is not yet approved or cleared by the [place] FDA and has been authorized for detection and/or diagnosis of SARS-CoV-2 by FDA under an Emergency Use Authorization (EUA). This EUA will remain in effect (meaning this test can be used) for the duration of the COVID-19 declaration under Section 564(b)(1) of the Act, 21 U.S.C. section 360bbb-3(b)(1), unless the authorization is terminated or revoked.  Performed at [facility], [place] [address], [place], [place] [address]   Respiratory (~20 pathogens) panel by  PCR     Status: None   Collection Time: 11/13/20 12:45 PM   Specimen: Nasopharyngeal Swab; Respiratory  Result Value Ref Range Status   Adenovirus NOT DETECTED NOT DETECTED Final   Coronavirus 229E NOT DETECTED NOT DETECTED Final    Comment: (NOTE) The Coronavirus on the Respiratory Panel, DOES NOT test for the novel  Coronavirus (2019 nCoV)    Coronavirus HKU1 NOT DETECTED NOT DETECTED Final   Coronavirus NL63 NOT DETECTED NOT DETECTED Final   Coronavirus OC43 NOT DETECTED NOT DETECTED Final   Metapneumovirus NOT DETECTED NOT DETECTED Final   Rhinovirus / Enterovirus NOT DETECTED NOT DETECTED Final   Influenza A NOT DETECTED NOT DETECTED Final   Influenza B NOT DETECTED NOT DETECTED Final   Parainfluenza Virus 1 NOT DETECTED NOT DETECTED Final   Parainfluenza Virus 2 NOT DETECTED NOT DETECTED Final   Parainfluenza Virus 3 NOT DETECTED NOT DETECTED Final   Parainfluenza Virus 4 NOT DETECTED NOT DETECTED Final   Respiratory Syncytial Virus NOT DETECTED NOT DETECTED Final   Bordetella pertussis NOT DETECTED NOT DETECTED Final   Bordetella Parapertussis NOT DETECTED NOT DETECTED Final   Chlamydophila pneumoniae NOT DETECTED NOT DETECTED Final   Mycoplasma pneumoniae NOT DETECTED NOT DETECTED Final    Comment: Performed at [facility], [place]. [address] [place], [place] [address]    Radiology Studies: ECHOCARDIOGRAM COMPLETE  Result Date: 11/15/2020    ECHOCARDIOGRAM REPORT   Patient Name:   Heather [name] Date of Exam: 11/15/2020 Medical Rec #:  [id number]        Height:       59.0 in Accession #:    [id number]       Weight:       175.1 lb Date of Birth:  Apr 14, 1930         BSA:          1.743 m Patient Age:    78 years         BP:           156/54 mmHg Patient Gender: F                HR:           78 bpm. Exam Location:  Inpatient Procedure: 2D Echo, Cardiac Doppler and Color Doppler Indications:    I50.23 Acute on chronic systolic (congestive) heart failure  History:         Patient has  prior history of Echocardiogram examinations, most                 recent 08/21/2019. CAD and Previous Myocardial Infarction,                 Arrythmias:Atrial Fibrillation, Signs/Symptoms:Shortness of                 Breath; Risk Factors:Hypertension and Dyslipidemia.                  Mitral Valve: valve is present in the mitral position.  Sonographer:    Glo Herring Referring Phys: [id number] OLADAPO ADEFESO IMPRESSIONS  1. Left ventricular ejection fraction, by estimation, is 60 to 65%. The left ventricle has normal function. The left ventricle has no regional wall motion abnormalities. There is mild concentric left ventricular hypertrophy. Left ventricular diastolic function could not be evaluated. There is hypokinesis of the left ventricular, basal inferior wall.  2. Right ventricular systolic function is normal. The right ventricular size is normal. There is moderately elevated pulmonary artery systolic pressure. The estimated right ventricular systolic pressure is 51.7 mmHg.  3. S/P MV repair. There is mild thickening of the mitral valve leaflet(s). There is diastolic doming of the anterior MV leaflet. The posterior leaflet appears fixed.     No evidence of mitral valve regurgitation. Moderate mitral stenosis. The mean mitral valve gradient is 6.5 mmHg. Moderate mitral annular calcification.  4. The aortic valve is calcified. There is moderate calcification of the aortic valve. There is moderate thickening of the aortic valve. Aortic valve regurgitation is not visualized. Mild to moderate aortic valve sclerosis/calcification is present, without any evidence of aortic stenosis. Aortic valve mean gradient measures 7.0 mmHg. Aortic valve Vmax measures 1.88 m/s.  5. The inferior vena cava is dilated in size with >50% respiratory variability, suggesting right atrial pressure of 8 mmHg.  6. Compared to study dated 08/21/2019, mitral stenosis is now moderate with increase in mean MV gradient from 5 to 63mmHg  and there is now moderate pulmonary HTN. FINDINGS  Left Ventricle: Left ventricular ejection fraction, by estimation, is 60 to 65%. The left ventricle has normal function. The left ventricle has no regional wall motion abnormalities. The left ventricular internal cavity size was normal in size. There is  mild concentric left ventricular hypertrophy. Left ventricular diastolic function could not be evaluated. Right Ventricle: The right ventricular size is normal. No increase in right ventricular wall thickness. Right ventricular systolic function is normal. There is moderately elevated pulmonary artery systolic pressure. The tricuspid regurgitant velocity is 3.30 m/s, and with an assumed right atrial pressure of 8 mmHg, the estimated right ventricular systolic pressure is 61.6 mmHg. Left Atrium: Left atrial size was normal in size. Right Atrium: Right atrial size was normal in size. Pericardium: There is no evidence of pericardial effusion. Mitral Valve: The mitral valve has been repaired/replaced. There is moderate thickening of the mitral valve leaflet(s). There is mild calcification of the mitral valve leaflet(s). Severely decreased mobility of the mitral valve leaflets. Moderate mitral annular calcification. Mild mitral valve regurgitation. There is a present in the mitral position. Moderate mitral valve stenosis. MV peak gradient, 19.0 mmHg. The mean mitral valve gradient is 7.0 mmHg. Tricuspid Valve: The tricuspid valve is normal in structure. Tricuspid valve regurgitation is mild . No evidence of tricuspid stenosis. Aortic Valve: The aortic valve is calcified. There is moderate calcification of the aortic valve. There is moderate thickening of the aortic valve. Aortic valve  regurgitation is not visualized. Mild to moderate aortic valve sclerosis/calcification is present, without any evidence of aortic stenosis. Aortic valve mean gradient measures 7.0 mmHg. Aortic valve peak gradient measures 14.1 mmHg.  Pulmonic Valve: The pulmonic valve was normal in structure. Pulmonic valve regurgitation is trivial. No evidence of pulmonic stenosis. Aorta: The aortic root is normal in size and structure. Venous: The inferior vena cava is dilated in size with greater than 50% respiratory variability, suggesting right atrial pressure of 8 mmHg. IAS/Shunts: No atrial level shunt detected by color flow Doppler.  LEFT VENTRICLE PLAX 2D LVIDd:         4.70 cm LVIDs:         3.20 cm LV PW:         1.20 cm LV IVS:        1.30 cm  RIGHT VENTRICLE          IVC RV Basal diam:  3.90 cm  IVC diam: 2.20 cm LEFT ATRIUM             Index        RIGHT ATRIUM           Index LA diam:        4.90 cm 2.81 cm/m   RA Area:     19.50 cm LA Vol (A2C):   59.1 ml 33.91 ml/m  RA Volume:   47.40 ml  27.19 ml/m LA Vol (A4C):   51.5 ml 29.55 ml/m LA Biplane Vol: 57.9 ml 33.22 ml/m  AORTIC VALVE                    PULMONIC VALVE AV Vmax:           188.00 cm/s  PV Vmax:       1.07 m/s AV Vmean:          123.500 cm/s PV Peak grad:  4.6 mmHg AV VTI:            0.396 m AV Peak Grad:      14.1 mmHg AV Mean Grad:      7.0 mmHg LVOT Vmax:         89.65 cm/s LVOT Vmean:        60.100 cm/s LVOT VTI:          0.188 m LVOT/AV VTI ratio: 0.47  AORTA Ao Root diam: 3.30 cm Ao Asc diam:  3.20 cm MITRAL VALVE                TRICUSPID VALVE MV Area (PHT): 2.88 cm     TR Peak grad:   43.6 mmHg MV Peak grad:  19.0 mmHg    TR Vmax:        330.00 cm/s MV Mean grad:  7.0 mmHg MV Vmax:       2.18 m/s     SHUNTS MV Vmean:      116.8 cm/s   Systemic VTI: 0.19 m MV Decel Time: 263 msec MV E velocity: 180.00 cm/s MV A velocity: 97.40 cm/s MV E/A ratio:  1.85 Fransico Him MD Electronically signed by Fransico Him MD Signature Date/Time: 11/15/2020/10:08:09 AM    Final    DG ESOPHAGUS W SINGLE CM (SOL OR THIN BA)  Result Date: 11/15/2020 CLINICAL DATA:  Chronic cough. EXAM: ESOPHAGUS/BARIUM SWALLOW/TABLET STUDY TECHNIQUE: Single contrast examination was performed using thin  liquid barium. This exam was performed by Willaim Sheng, and was supervised and interpreted by Abigail Miyamoto. FLUOROSCOPY TIME:  Radiation  Exposure Index (as provided by the fluoroscopic device): Not applicable. If the device does not provide the exposure index: Fluoroscopy Time:  2 minutes and 24 seconds Number of Acquired Images:  None COMPARISON:  11/13/2020 chest CT FINDINGS: Focused, single-contrast exam performed with the patient in supine/oblique positions. Evaluation of esophageal motility demonstrates mild dysmotility. Full column evaluation of the esophagus demonstrates no persistent narrowing or stricture. Delayed passage of a barium tablet at the craniocaudal level of the carina. No underlying stricture or focal abnormality identified at this level. IMPRESSION: Focused, single-contrast exam performed. Esophageal dysmotility, likely presbyesophagus. No evidence of esophageal stricture. Delayed passage of barium tablet in the mid to lower esophagus, presumably secondary to dysmotility. Electronically Signed   By: Abigail Miyamoto M.D.   On: 11/15/2020 11:22       Steele Ledonne T. [place]  If 7PM-7AM, please contact night-coverage [email or web address] 11/15/2020, 12:07 PM

## 2020-11-15 NOTE — Progress Notes (Signed)
Mobility Specialist Progress Note:   11/15/20 1513  Mobility  Activity Ambulated in hall  Level of Assistance Standby assist, set-up cues, supervision of patient - no hands on  Assistive Device Front wheel walker  Distance Ambulated (ft) 340 ft  Mobility Ambulated with assistance in hallway  Mobility Response Tolerated well  Mobility performed by Mobility specialist  $Mobility charge 1 Mobility   Pt received in bed willing to participate in mobility. No complaints of pain. Pt returned to bed with call bell in reach and all needs met.   [facility] Health and safety inspector Phone [phone]

## 2020-11-15 NOTE — Discharge Instructions (Signed)

## 2020-11-16 ENCOUNTER — Inpatient Hospital Stay (HOSPITAL_COMMUNITY): Payer: Medicare Other

## 2020-11-16 DIAGNOSIS — K224 Dyskinesia of esophagus: Secondary | ICD-10-CM

## 2020-11-16 DIAGNOSIS — E871 Hypo-osmolality and hyponatremia: Secondary | ICD-10-CM

## 2020-11-16 LAB — RENAL FUNCTION PANEL
Albumin: 3.8 g/dL (ref 3.5–5.0)
Anion gap: 7 (ref 5–15)
BUN: 29 mg/dL — ABNORMAL HIGH (ref 8–23)
CO2: 25 mmol/L (ref 22–32)
Calcium: 9.6 mg/dL (ref 8.9–10.3)
Chloride: 99 mmol/L (ref 98–111)
Creatinine, Ser: 0.95 mg/dL (ref 0.44–1.00)
GFR, Estimated: 58 mL/min — ABNORMAL LOW (ref >60)
Glucose, Bld: 137 mg/dL — ABNORMAL HIGH (ref 70–99)
Phosphorus: 2.8 mg/dL (ref 2.5–4.6)
Potassium: 4.7 mmol/L (ref 3.5–5.1)
Sodium: 131 mmol/L — ABNORMAL LOW (ref 135–145)

## 2020-11-16 LAB — CBC WITH DIFFERENTIAL/PLATELET
Abs Immature Granulocytes: 0.19 10*3/uL — ABNORMAL HIGH (ref 0.00–0.07)
Basophils Absolute: 0 10*3/uL (ref 0.0–0.1)
Basophils Relative: 0 %
Eosinophils Absolute: 0 10*3/uL (ref 0.0–0.5)
Eosinophils Relative: 0 %
HCT: 35.9 % — ABNORMAL LOW (ref 36.0–46.0)
Hemoglobin: 12 g/dL (ref 12.0–15.0)
Immature Granulocytes: 1 %
Lymphocytes Relative: 5 %
Lymphs Abs: 0.8 10*3/uL (ref 0.7–4.0)
MCH: 29.9 pg (ref 26.0–34.0)
MCHC: 33.4 g/dL (ref 30.0–36.0)
MCV: 89.5 fL (ref 80.0–100.0)
Monocytes Absolute: 0.7 10*3/uL (ref 0.1–1.0)
Monocytes Relative: 4 %
Neutro Abs: 16.5 10*3/uL — ABNORMAL HIGH (ref 1.7–7.7)
Neutrophils Relative %: 90 %
Platelets: 219 10*3/uL (ref 150–400)
RBC: 4.01 MIL/uL (ref 3.87–5.11)
RDW: 13.7 % (ref 11.5–15.5)
WBC: 18.3 10*3/uL — ABNORMAL HIGH (ref 4.0–10.5)
nRBC: 0 % (ref 0.0–0.2)

## 2020-11-16 LAB — BRAIN NATRIURETIC PEPTIDE: B Natriuretic Peptide: 221 pg/mL — ABNORMAL HIGH (ref 0.0–100.0)

## 2020-11-16 LAB — MAGNESIUM: Magnesium: 2.1 mg/dL (ref 1.7–2.4)

## 2020-11-16 LAB — EXPECTORATED SPUTUM ASSESSMENT W GRAM STAIN, RFLX TO RESP C

## 2020-11-16 MED ORDER — FUROSEMIDE 10 MG/ML IJ SOLN
40.0000 mg | Freq: Every day | INTRAMUSCULAR | Status: AC
  Administered 2020-11-16 – 2020-11-17 (×2): 40 mg via INTRAVENOUS
  Filled 2020-11-16 (×2): qty 4

## 2020-11-16 NOTE — Progress Notes (Signed)
Report received and care assumed from previous shift. VS obtained, shift assessments completed - see flowsheets. Patient c/o mild pain to bilateral rib cage from coughing - PRN robitussin given as ordered (see MAR). Turns and repositions self in bed. Patient currently resting in bed, bed in lowest position. Denies needs. Call bell within reach.

## 2020-11-16 NOTE — Plan of Care (Signed)
  Problem: Education: Goal: Knowledge of General Education information will improve Description Including pain rating scale, medication(s)/side effects and non-pharmacologic comfort measures Outcome: Progressing   Problem: Health Behavior/Discharge Planning: Goal: Ability to manage health-related needs will improve Outcome: Progressing   

## 2020-11-16 NOTE — Plan of Care (Signed)

## 2020-11-16 NOTE — Progress Notes (Signed)
PROGRESS NOTE  Heather [name] QQI:[id number] DOB: [birth year]   PCP: Leonard Downing, MD  Patient is from: Home.  DOA: 11/12/2020 LOS: 3  Chief complaints:  Chief Complaint  Patient presents with   Shortness of Breath     Brief Narrative / Interim history: 87 year old F with PMH of CAD/CABG in 2003 and PCI stent to VG> RCA in 09/4172, diastolic CHF, MV repair in 2003, SSS/PPM, PAF on Eliquis, left thyroidectomy in 1968, right thyromegaly, hypothyroidism, HTN, prolonged QT, GERD and COVID-19 infection in 07/2020 presenting with shortness of breath, dry cough, orthopnea and lower extremity edema for few days and admitted with working diagnosis of possibly undiagnosed asthma/COPD and possible CHF exacerbation.  ED work-up unrevealing except for slightly elevated BNP to 135.  CXR showed cardiomegaly without edema or infiltrate.  CT chest with multiple small nodules with the largest about 3 mm but no other significant finding.  COVID-19 and full RVP negative.  TTE with LVEF of 60 to 65%, mild concentric LVH, focal hypokinesis basal inferior wall, moderate mitral stenosis, and RVSP of 51.6 mmHg. Started on steroid, nebulizers, IV Lasix.  Likely home in the next 24 to 48 hours.  Needs follow-up with pulmonology +/- cardiology.  Also follow-up with GI for esophageal dysmotility   Subjective: Seen and examined earlier this morning.  No major events overnight of this morning.  Reports some improvement in her breathing and cough today but not quite close to baseline.  Cough is starting to get loose.  She denies chest pain, nausea or vomiting.  Objective: Vitals:   11/15/20 2049 11/16/20 0338 11/16/20 0615 11/16/20 1052  BP: (!) 129/48 (!) 157/69  140/63  Pulse: 76 73  76  Resp: 20 20  19   Temp: 98 F (36.7 C) 97.9 F (36.6 C)  97.8 F (36.6 C)  TempSrc: Oral Oral  Oral  SpO2: 94% 95%  94%  Weight:   79.3 kg   Height:        Intake/Output Summary (Last 24 hours) at 11/16/2020 1642 Last  data filed at 11/16/2020 1238 Gross per 24 hour  Intake 764 ml  Output 2200 ml  Net -1436 ml   Filed Weights   11/14/20 0512 11/15/20 0520 11/16/20 0615  Weight: 79.9 kg 79.4 kg 79.3 kg    Examination:  GENERAL: No apparent distress.  Nontoxic. HEENT: MMM.  Vision and hearing grossly intact.  NECK: Supple.  Notable JVD on exam. RESP: 94% on RA.  No IWOB.  Rhonchi bilaterally.  Coughing with inhalation CVS:  RRR. Heart sounds normal.  ABD/GI/GU: BS+. Abd soft, NTND.  MSK/EXT:  Moves extremities. No apparent deformity. No edema.  SKIN: no apparent skin lesion or wound NEURO: Awake and alert. Oriented appropriately.  No apparent focal neuro deficit. PSYCH: Calm. Normal affect.   Procedures:  None  Microbiology summarized: [id number] and influenza PCR nonreactive. Full RVP panel negative.  Assessment & Plan: Respiratory distress from progressive dyspnea and dry cough-no history of asthma or COPD.  Briefly smoked before 1960s.  Broad differential diagnosis including undiagnosed asthma, GERD, allergy, involvement of phrenic nerve irritation, amiodarone toxicity and residual from COVID-19 infection in 07/2020 or CHF with PAH.  COVID-19, influenza and full RVP negative.  CXR with cardiomegaly.  BNP slightly elevated.  No pleuritic chest pain or tachycardia to suggest PE.  No eosinophilia on CBC.  She has orthopnea unchanged from baseline.  BNP slightly elevated.  TTE as above.  Has JVD on exam.  CT chest showed  multiple small nodules with the largest about 3 mm but no acute infiltrate.  Some improvement after adjusting nebs and cough meds. -PCCM recommended continuing steroid and nebs.  Did not feel this is related to COVID or amiodarone. -Continue IV Solu-Medrol 80 mg twice daily -Added Brovana, Pulmicort and Yupelri -Continue DuoNeb as needed -Scheduled Mucinex, Claritin, honey, with as needed Robitussin-DM -Increase Protonix to twice daily -Trial of IV Lasix 40 mg  daily -IS/OOB/PT/OT   Acute on chronic diastolic CHF/portal hypertension/moderate mitral valve stenosis: History of mitral valve repair in 2003.  Has chronic orthopnea and mildly elevated BNP.  Has JVD on exam.  Edema reportedly resolved after 2 doses of IV Lasix.  TTE as above. -Start IV Lasix 40 mg daily -Monitor fluid status, renal functions and electrolytes. -Sodium and fluid restriction   SSS/PPM/paroxysmal A. Fib -Continue amiodarone and Eliquis  CAD/CABG in 2003 and PCI stent to VG> RCA in 04/2015: No anginal symptoms.  TTE as above.  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 -Continue Zetia  Essential hypertension: Normotensive. -Discontinued amlodipine in the setting of esophageal dysmotility.  Esophageal dysmotility/esophageal dysphagia-esophagram concerning for esophageal dysmotility but no stricture. -PPI as above -Outpatient follow-up with GI  History of left thyroidectomy in 1968 History of right thyromegaly: reportedly not amenable to resection for right ENT. -Continue home Synthroid.  Prolonged QTC: 527.  Likely exaggerated by wide QRS due to RBBB -Minimize or avoid QT prolonging drugs.  Hyponatremia: Due to hypervolemia? -IV Lasix as above. -Recheck in the morning   Class II obesity Body mass index is 35.31 kg/m.  -Encourage lifestyle change to lose weight.       DVT prophylaxis:  SCDs Start: 11/13/20 0049 apixaban (ELIQUIS) tablet 5 mg  Code Status: DNR/DNI Family Communication: Patient and/or RN. Available if any question.  Level of care: Telemetry Medical Status is: Inpatient  Remains inpatient appropriate because: Due to ongoing respiratory distress with signs of fluid overload, persistent cough and dyspnea requiring IV Solu-Medrol, IV Lasix, adjustment of medications, close monitoring and further evaluation    Consultants:  Pulmonology over the phone   Sch Meds:  Scheduled Meds:  amiodarone  200 mg Oral Daily   apixaban   5 mg Oral BID   arformoterol  15 mcg Nebulization BID   budesonide (PULMICORT) nebulizer solution  0.5 mg Nebulization BID   cholecalciferol  1,000 Units Oral Daily   clopidogrel  75 mg Oral Daily   docusate sodium  100 mg Oral BID   ezetimibe  10 mg Oral Daily   furosemide  40 mg Intravenous Daily   guaiFENesin  600 mg Oral BID   levothyroxine  50 mcg Oral QAC breakfast   loratadine  10 mg Oral Daily   methylPREDNISolone (SOLU-MEDROL) injection  80 mg Intravenous Q12H   pantoprazole  40 mg Oral BID   revefenacin  175 mcg Nebulization Daily   sodium chloride HYPERTONIC  4 mL Nebulization Daily   Continuous Infusions: PRN Meds:.alum & mag hydroxide-simeth, guaiFENesin-dextromethorphan, ipratropium-albuterol, nitroGLYCERIN, polyethylene glycol, senna-docusate  Antimicrobials: Anti-infectives (From admission, onward)    None        I have personally reviewed the following labs and images: CBC: Recent Labs  Lab 11/12/20 1737 11/13/20 0539 11/16/20 0427  WBC 8.3 8.9 18.3*  NEUTROABS  --   --  16.5*  HGB 13.2 12.2 12.0  HCT 40.3 36.8 35.9*  MCV 92.2 91.5 89.5  PLT 229 195 219   BMP &GFR Recent Labs  Lab  11/12/20 1737 11/13/20 0539 11/14/20 0610 11/15/20 0302 11/16/20 0427  NA 139 135 133* 132* 131*  K 4.0 3.9 5.0 4.9 4.7  CL 103 103 98 99 99  CO2 25 22 26 25 25   GLUCOSE 101* 137* 141* 132* 137*  BUN 16 16 18  24* 29*  CREATININE 0.96 0.92 1.02* 1.07* 0.95  CALCIUM 10.0 9.3 10.0 10.1 9.6  MG  --  2.0 1.8 2.2 2.1  PHOS  --  3.8  --   --  2.8   Estimated Creatinine Clearance: 37.2 mL/min (by C-G formula based on SCr of 0.95 mg/dL). Liver & Pancreas: Recent Labs  Lab 11/12/20 1737 11/13/20 0539 11/16/20 0427  AST 25 25  --   ALT 26 22  --   ALKPHOS 59 56  --   BILITOT 1.1 1.1  --   PROT 7.4 6.8  --   ALBUMIN 4.5 4.1 3.8   No results for input(s): LIPASE, AMYLASE in the last 168 hours. No results for input(s): AMMONIA in the last 168  hours. Diabetic: No results for input(s): HGBA1C in the last 72 hours. No results for input(s): GLUCAP in the last 168 hours. Cardiac Enzymes: No results for input(s): CKTOTAL, CKMB, CKMBINDEX, TROPONINI in the last 168 hours. No results for input(s): PROBNP in the last 8760 hours. Coagulation Profile: Recent Labs  Lab 11/13/20 0539  INR 1.1   Thyroid Function Tests: No results for input(s): TSH, T4TOTAL, FREET4, T3FREE, THYROIDAB in the last 72 hours. Lipid Profile: No results for input(s): CHOL, HDL, LDLCALC, TRIG, CHOLHDL, LDLDIRECT in the last 72 hours. Anemia Panel: No results for input(s): VITAMINB12, FOLATE, FERRITIN, TIBC, IRON, RETICCTPCT in the last 72 hours. Urine analysis:    Component Value Date/Time   COLORURINE YELLOW 07/19/2014 1844   APPEARANCEUR CLOUDY (A) 07/19/2014 1844   LABSPEC 1.014 07/19/2014 1844   PHURINE 5.5 07/19/2014 1844   GLUCOSEU NEGATIVE 07/19/2014 1844   HGBUR SMALL (A) 07/19/2014 1844   BILIRUBINUR NEGATIVE 07/19/2014 1844   KETONESUR NEGATIVE 07/19/2014 1844   PROTEINUR NEGATIVE 07/19/2014 1844   UROBILINOGEN 0.2 07/19/2014 1844   NITRITE NEGATIVE 07/19/2014 1844   LEUKOCYTESUR NEGATIVE 07/19/2014 1844   Sepsis Labs: Invalid input(s): PROCALCITONIN, [place]  Microbiology: Recent Results (from the past 240 hour(s))  Resp Panel by RT-PCR (Flu A&B, Covid) Nasopharyngeal Swab     Status: None   Collection Time: 11/13/20 12:40 AM   Specimen: Nasopharyngeal Swab; Nasopharyngeal(NP) swabs in vial transport medium  Result Value Ref Range Status   SARS Coronavirus 2 by RT PCR NEGATIVE NEGATIVE Final    Comment: (NOTE) SARS-CoV-2 target nucleic acids are NOT DETECTED.  The SARS-CoV-2 RNA is generally detectable in upper respiratory specimens during the acute phase of infection. The lowest concentration of SARS-CoV-2 viral copies this assay can detect is 138 copies/mL. A negative result does not preclude SARS-Cov-2 infection and should  not be used as the sole basis for treatment or other patient management decisions. A negative result may occur with  improper specimen collection/handling, submission of specimen other than nasopharyngeal swab, presence of viral mutation(s) within the areas targeted by this assay, and inadequate number of viral copies(<138 copies/mL). A negative result must be combined with clinical observations, patient history, and epidemiological information. The expected result is Negative.  Fact Sheet for Patients:  EntrepreneurPulse.com.au  Fact Sheet for Healthcare Providers:  IncredibleEmployment.be  This test is no t yet approved or cleared by the [place] FDA and  has been authorized for detection  and/or diagnosis of SARS-CoV-2 by FDA under an Emergency Use Authorization (EUA). This EUA will remain  in effect (meaning this test can be used) for the duration of the COVID-19 declaration under Section 564(b)(1) of the Act, 21 U.S.C.section 360bbb-3(b)(1), unless the authorization is terminated  or revoked sooner.       Influenza A by PCR NEGATIVE NEGATIVE Final   Influenza B by PCR NEGATIVE NEGATIVE Final    Comment: (NOTE) The Xpert Xpress SARS-CoV-2/FLU/RSV plus assay is intended as an aid in the diagnosis of influenza from Nasopharyngeal swab specimens and should not be used as a sole basis for treatment. Nasal washings and aspirates are unacceptable for Xpert Xpress SARS-CoV-2/FLU/RSV testing.  Fact Sheet for Patients: EntrepreneurPulse.com.au  Fact Sheet for Healthcare Providers: IncredibleEmployment.be  This test is not yet approved or cleared by the [place] FDA and has been authorized for detection and/or diagnosis of SARS-CoV-2 by FDA under an Emergency Use Authorization (EUA). This EUA will remain in effect (meaning this test can be used) for the duration of the COVID-19 declaration under  Section 564(b)(1) of the Act, 21 U.S.C. section 360bbb-3(b)(1), unless the authorization is terminated or revoked.  Performed at [facility], [place] [address] [place], [place] [address]   Respiratory (~20 pathogens) panel by PCR     Status: None   Collection Time: 11/13/20 12:45 PM   Specimen: Nasopharyngeal Swab; Respiratory  Result Value Ref Range Status   Adenovirus NOT DETECTED NOT DETECTED Final   Coronavirus 229E NOT DETECTED NOT DETECTED Final    Comment: (NOTE) The Coronavirus on the Respiratory Panel, DOES NOT test for the novel  Coronavirus (2019 nCoV)    Coronavirus HKU1 NOT DETECTED NOT DETECTED Final   Coronavirus NL63 NOT DETECTED NOT DETECTED Final   Coronavirus OC43 NOT DETECTED NOT DETECTED Final   Metapneumovirus NOT DETECTED NOT DETECTED Final   Rhinovirus / Enterovirus NOT DETECTED NOT DETECTED Final   Influenza A NOT DETECTED NOT DETECTED Final   Influenza B NOT DETECTED NOT DETECTED Final   Parainfluenza Virus 1 NOT DETECTED NOT DETECTED Final   Parainfluenza Virus 2 NOT DETECTED NOT DETECTED Final   Parainfluenza Virus 3 NOT DETECTED NOT DETECTED Final   Parainfluenza Virus 4 NOT DETECTED NOT DETECTED Final   Respiratory Syncytial Virus NOT DETECTED NOT DETECTED Final   Bordetella pertussis NOT DETECTED NOT DETECTED Final   Bordetella Parapertussis NOT DETECTED NOT DETECTED Final   Chlamydophila pneumoniae NOT DETECTED NOT DETECTED Final   Mycoplasma pneumoniae NOT DETECTED NOT DETECTED Final    Comment: Performed at [facility], [place]. [address] [place], [place] [address]  Expectorated Sputum Assessment w Gram Stain, Rflx to Resp Cult     Status: None   Collection Time: 11/16/20  6:23 AM   Specimen: Expectorated Sputum  Result Value Ref Range Status   Specimen Description EXPECTORATED SPUTUM  Final   Special Requests NONE  Final   Sputum evaluation   Final    THIS SPECIMEN IS ACCEPTABLE FOR SPUTUM CULTURE Performed at [facility], [place] [address], [place], [place] [address]    Report Status 11/16/2020 FINAL  Final  Culture, Respiratory w Gram Stain     Status: None (Preliminary result)   Collection Time: 11/16/20  6:23 AM  Result Value Ref Range Status   Specimen Description EXPECTORATED SPUTUM  Final   Special Requests NONE Reflexed from [id number]  Final   Gram Stain   Final  MODERATE WBC PRESENT, PREDOMINANTLY MONONUCLEAR ABUNDANT GRAM POSITIVE COCCI RARE GRAM NEGATIVE RODS Performed at [facility], [place] [address] [place], [place] [address]    Culture PENDING  Incomplete   Report Status PENDING  Incomplete    Radiology Studies: DG Chest 2 View  Result Date: 11/16/2020 CLINICAL DATA:  Cough, shortness of breath EXAM: CHEST - 2 VIEW COMPARISON:  Previous studies including the chest radiograph done on 11/12/2020 and CT done on 11/13/2020 FINDINGS: Transverse diameter of heart is increased. There are no signs of pulmonary edema or focal pulmonary consolidation. There is no pleural effusion or pneumothorax. Pacemaker battery is seen in the left infraclavicular region. There is evidence of previous coronary bypass surgery. IMPRESSION: Cardiomegaly. There are no signs of pulmonary edema. There are no new focal infiltrates. Electronically Signed   By: Elmer Picker M.D.   On: 11/16/2020 09:13       Odarius Dines T. Mosier  If 7PM-7AM, please contact night-coverage [email or web address] 11/16/2020, 4:42 PM

## 2020-11-16 NOTE — Progress Notes (Signed)
Physical Therapy Treatment Patient Details Name: Heather [name] MRN: [id number] DOB: Nov 02, 1930 Today's Date: 11/16/2020   History of Present Illness Pt is an 87 y/o female admitted from home secondary to SOB, possibly secondary to undiagnosed asthma/COPD and/or CHF. PMH including but not limited to multivessel CAD s/p CABG and PCI , HTN, history of mitral valve repair, hypothyroidism, chronic diastolic CHF, GERD, hyperlipidemia, A. fib on Eliquis.    PT Comments    Pt reports she just returned to bed, but is willing to work with therapy. Pt is limited in safe mobility by coughing which increases with activity in presence of generalized weakness. Pt is supervision for bed mobility and transfers and min guard for ambulation with RW. Pt requesting cough medication at end of session. RN notified. D/c plans remain appropriate at this time. PT will continue to follow acutely.      Recommendations for follow up therapy are one component of a multi-disciplinary discharge planning process, led by the attending physician.  Recommendations may be updated based on patient status, additional functional criteria and insurance authorization.  Follow Up Recommendations  No PT follow up     Assistance Recommended at Discharge PRN  Equipment Recommendations  None recommended by PT       Precautions / Restrictions Precautions Precautions: Fall Restrictions Weight Bearing Restrictions: No     Mobility  Bed Mobility Overal bed mobility: Needs Assistance Bed Mobility: Supine to Sit     Supine to sit: Supervision     General bed mobility comments: for safety    Transfers Overall transfer level: Needs assistance Equipment used: Rolling walker (2 wheels) Transfers: Sit to/from Stand Sit to Stand: Supervision           General transfer comment: good technique utilized, steady overall with transitional movement    Ambulation/Gait Ambulation/Gait assistance: Min guard Gait Distance  (Feet): 300 Feet Assistive device: Rollator (4 wheels) Gait Pattern/deviations: Step-through pattern;Decreased stride length Gait velocity: decreased   General Gait Details: pt steady with use of Rollator, no instability or overt LOB, 1x standing rest break secondary to increased coughing          Balance Overall balance assessment: Needs assistance Sitting-balance support: Feet supported Sitting balance-Leahy Scale: Good     Standing balance support: No upper extremity supported;During functional activity;Single extremity supported;Bilateral upper extremity supported Standing balance-Leahy Scale: Fair                              Cognition Arousal/Alertness: Awake/alert Behavior During Therapy: WFL for tasks assessed/performed Overall Cognitive Status: Within Functional Limits for tasks assessed                                             General Comments General comments (skin integrity, edema, etc.): Son in room, requesting check on Robitussin, checked with RN and will bring to room      Pertinent Vitals/Pain Pain Assessment: Faces Faces Pain Scale: Hurts little more Pain Location: ribs with coughing Pain Descriptors / Indicators: Sharp;Throbbing Pain Intervention(s): Limited activity within patient's tolerance;Monitored during session;Repositioned     PT Goals (current goals can now be found in the care plan section) Acute Rehab PT Goals Patient Stated Goal: return home PT Goal Formulation: With patient Time For Goal Achievement: 11/28/20 Potential to Achieve Goals: Good Progress towards PT  goals: Progressing toward goals    Frequency    Min 3X/week      PT Plan Current plan remains appropriate       AM-PAC PT "6 Clicks" Mobility   Outcome Measure  Help needed turning from your back to your side while in a flat bed without using bedrails?: None Help needed moving from lying on your back to sitting on the side of a flat  bed without using bedrails?: None Help needed moving to and from a bed to a chair (including a wheelchair)?: None Help needed standing up from a chair using your arms (e.g., wheelchair or bedside chair)?: None Help needed to walk in hospital room?: None Help needed climbing 3-5 steps with a railing? : A Little 6 Click Score: 23    End of Session Equipment Utilized During Treatment: Gait belt Activity Tolerance: Patient tolerated treatment well Patient left: with call bell/phone within reach;in bed (sitting EoB waiting on cough medication) Nurse Communication: Mobility status PT Visit Diagnosis: Other abnormalities of gait and mobility (R26.89)     Time: 1424-1440 PT Time Calculation (min) (ACUTE ONLY): 16 min  Charges:  $Therapeutic Exercise: 8-22 mins                     Derian Dimalanta B. Migdalia Dk PT, DPT Acute Rehabilitation Services Pager [phone] Office [phone]    [place] 11/16/2020, 3:51 PM

## 2020-11-17 LAB — RENAL FUNCTION PANEL
Albumin: 4 g/dL (ref 3.5–5.0)
Anion gap: 8 (ref 5–15)
BUN: 35 mg/dL — ABNORMAL HIGH (ref 8–23)
CO2: 28 mmol/L (ref 22–32)
Calcium: 9.9 mg/dL (ref 8.9–10.3)
Chloride: 96 mmol/L — ABNORMAL LOW (ref 98–111)
Creatinine, Ser: 1.19 mg/dL — ABNORMAL HIGH (ref 0.44–1.00)
GFR, Estimated: 44 mL/min — ABNORMAL LOW (ref >60)
Glucose, Bld: 140 mg/dL — ABNORMAL HIGH (ref 70–99)
Phosphorus: 3.8 mg/dL (ref 2.5–4.6)
Potassium: 4.9 mmol/L (ref 3.5–5.1)
Sodium: 132 mmol/L — ABNORMAL LOW (ref 135–145)

## 2020-11-17 LAB — MAGNESIUM: Magnesium: 2.1 mg/dL (ref 1.7–2.4)

## 2020-11-17 LAB — BRAIN NATRIURETIC PEPTIDE: B Natriuretic Peptide: 132.9 pg/mL — ABNORMAL HIGH (ref 0.0–100.0)

## 2020-11-17 MED ORDER — GUAIFENESIN ER 600 MG PO TB12
1200.0000 mg | ORAL_TABLET | Freq: Two times a day (BID) | ORAL | Status: DC
  Administered 2020-11-17 – 2020-11-18 (×3): 1200 mg via ORAL
  Filled 2020-11-17 (×3): qty 2

## 2020-11-17 MED ORDER — PREDNISONE 20 MG PO TABS
40.0000 mg | ORAL_TABLET | Freq: Every day | ORAL | Status: DC
  Administered 2020-11-18: 40 mg via ORAL
  Filled 2020-11-17: qty 2

## 2020-11-17 NOTE — Progress Notes (Signed)
Patient does not want bed alarm on, patient educated  about safety and when to call for help.bed alarm turned off.

## 2020-11-17 NOTE — Progress Notes (Signed)
PROGRESS NOTE  Heather [name] SNK:[id number] DOB: 1930-02-15   PCP: Leonard Downing, MD  Patient is from: Home.  DOA: 11/12/2020 LOS: 4  Chief complaints:  Chief Complaint  Patient presents with   Shortness of Breath     Brief Narrative / Interim history: 87 year old F with PMH of CAD/CABG in 2003 and PCI stent to VG> RCA in 09/3788, diastolic CHF, MV repair in 2003, SSS/PPM, PAF on Eliquis, left thyroidectomy in 1968, right thyromegaly, hypothyroidism, HTN, prolonged QT, GERD and COVID-19 infection in 07/2020 presenting with shortness of breath, dry cough, orthopnea and lower extremity edema for few days and admitted with working diagnosis of possibly undiagnosed asthma/COPD and possible CHF exacerbation.  ED work-up unrevealing except for slightly elevated BNP to 135.  CXR showed cardiomegaly without edema or infiltrate.  CT chest with multiple small nodules with the largest about 3 mm but no other significant finding.  COVID-19 and full RVP negative.  TTE with LVEF of 60 to 65%, mild concentric LVH, focal hypokinesis basal inferior wall, moderate mitral stenosis, and RVSP of 51.6 mmHg. Started on steroid, nebulizers, IV Lasix.  Likely home in the next 24 to 48 hours.  Needs follow-up with pulmonology +/- cardiology.  Also follow-up with GI for esophageal dysmotility   Subjective: Baseline weight around 175lbs  Objective: Vitals:   11/17/20 0300 11/17/20 0521 11/17/20 0934 11/17/20 1050  BP:  (!) 134/55 (!) 137/52 (!) 135/48  Pulse:  (!) 54 83 79  Resp:  20 18 19   Temp:  (!) 97.4 F (36.3 C) 97.9 F (36.6 C) 98.4 F (36.9 C)  TempSrc:  Oral Oral Oral  SpO2:  95% 95% 94%  Weight: 77.7 kg     Height:        Intake/Output Summary (Last 24 hours) at 11/17/2020 1122 Last data filed at 11/17/2020 1024 Gross per 24 hour  Intake 956 ml  Output 2100 ml  Net -1144 ml   Filed Weights   11/15/20 0520 11/16/20 0615 11/17/20 0300  Weight: 79.4 kg 79.3 kg 77.7 kg     Examination:   General: Appearance:    Obese female in no acute distress     Lungs:     Rhonchi b/l  Heart:    Normal heart rate.  MS:   All extremities are intact.    Neurologic:   Awake, alert, oriented x 3. No apparent focal neurological           defect.      Procedures:  None  Microbiology summarized: [id number] and influenza PCR nonreactive. Full RVP panel negative.  Assessment & Plan: Respiratory distress from progressive dyspnea and dry cough-no history of asthma or COPD.  Briefly smoked before 1960s.  Broad differential diagnosis including undiagnosed asthma, GERD, allergy, involvement of phrenic nerve irritation, amiodarone toxicity and residual from COVID-19 infection in 07/2020 or CHF with PAH.   -COVID-19, influenza and full RVP negative.   -CXR with cardiomegaly.  BNP slightly elevated.   -No pleuritic chest pain or tachycardia to suggest PE.  No eosinophilia on CBC.  She has orthopnea unchanged from baseline.  BNP slightly elevated.  TTE as above.  Has JVD on exam.  CT chest showed multiple small nodules with the largest about 3 mm but no acute infiltrate.  Some improvement after adjusting nebs and cough meds. -PCCM recommended continuing steroid and nebs.  Did not feel this is related to COVID or amiodarone. -wean to PO steroids -Brovana, Pulmicort and Yupelri -Continue DuoNeb  as needed -Scheduled Mucinex, Claritin, honey, with as needed Robitussin-DM -Increase Protonix to twice daily -IS/OOB/PT/OT -pulm toilet -sputum culture pending   Acute on chronic diastolic CHF/portal hypertension/moderate mitral valve stenosis: History of mitral valve repair in 2003.  Has chronic orthopnea and mildly elevated BNP.  - IV Lasix 40 mg daily -Monitor fluid status, renal functions and electrolytes. -Sodium and fluid restriction   SSS/PPM/paroxysmal A. Fib -Continue amiodarone and Eliquis  CAD/CABG in 2003 and PCI stent to VG> RCA in 04/2015: No anginal symptoms.  TTE as  above.  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 -Continue Zetia  Essential hypertension: Normotensive. -Discontinued amlodipine  Esophageal dysmotility/esophageal dysphagia-esophagram concerning for esophageal dysmotility but no stricture. -PPI as above -Outpatient follow-up with GI  History of left thyroidectomy in 1968 History of right thyromegaly: reportedly not amenable to resection for right ENT. -Continue home Synthroid.  Prolonged QTC: 527.  Likely exaggerated by wide QRS due to RBBB -Minimize or avoid QT prolonging drugs.  Hyponatremia:  -stable -? baseline  Obesity Estimated body mass index is 34.62 kg/m as calculated from the following:   Height as of this encounter: 4\' 11"  (1.499 m).   Weight as of this encounter: 77.7 kg.        DVT prophylaxis:  SCDs Start: 11/13/20 0049 apixaban (ELIQUIS) tablet 5 mg  Code Status: DNR/DNI Level of care: Telemetry Medical Status is: Inpatient  Remains inpatient appropriate because: pulm toilet, diuresis, abnormal lung sounds    Consultants:  Pulmonology (phone per Dr. Cyndia Skeeters)   Sch Meds:  Scheduled Meds:  amiodarone  200 mg Oral Daily   apixaban  5 mg Oral BID   arformoterol  15 mcg Nebulization BID   budesonide (PULMICORT) nebulizer solution  0.5 mg Nebulization BID   cholecalciferol  1,000 Units Oral Daily   clopidogrel  75 mg Oral Daily   docusate sodium  100 mg Oral BID   ezetimibe  10 mg Oral Daily   furosemide  40 mg Intravenous Daily   guaiFENesin  1,200 mg Oral BID   levothyroxine  50 mcg Oral QAC breakfast   loratadine  10 mg Oral Daily   pantoprazole  40 mg Oral BID   predniSONE  40 mg Oral Q breakfast   revefenacin  175 mcg Nebulization Daily   Continuous Infusions: PRN Meds:.alum & mag hydroxide-simeth, guaiFENesin-dextromethorphan, ipratropium-albuterol, nitroGLYCERIN, polyethylene glycol, senna-docusate  Antimicrobials: Anti-infectives (From admission, onward)    None         I have personally reviewed the following labs and images: CBC: Recent Labs  Lab 11/12/20 1737 11/13/20 0539 11/16/20 0427  WBC 8.3 8.9 18.3*  NEUTROABS  --   --  16.5*  HGB 13.2 12.2 12.0  HCT 40.3 36.8 35.9*  MCV 92.2 91.5 89.5  PLT 229 195 219   BMP &GFR Recent Labs  Lab 11/13/20 0539 11/14/20 0610 11/15/20 0302 11/16/20 0427 11/17/20 0350  NA 135 133* 132* 131* 132*  K 3.9 5.0 4.9 4.7 4.9  CL 103 98 99 99 96*  CO2 22 26 25 25 28   GLUCOSE 137* 141* 132* 137* 140*  BUN 16 18 24* 29* 35*  CREATININE 0.92 1.02* 1.07* 0.95 1.19*  CALCIUM 9.3 10.0 10.1 9.6 9.9  MG 2.0 1.8 2.2 2.1 2.1  PHOS 3.8  --   --  2.8 3.8   Estimated Creatinine Clearance: 29.4 mL/min (A) (by C-G formula based on SCr of 1.19 mg/dL (H)). Liver & Pancreas: Recent Labs  Lab 11/12/20  1737 11/13/20 0539 11/16/20 0427 11/17/20 0350  AST 25 25  --   --   ALT 26 22  --   --   ALKPHOS 59 56  --   --   BILITOT 1.1 1.1  --   --   PROT 7.4 6.8  --   --   ALBUMIN 4.5 4.1 3.8 4.0   No results for input(s): LIPASE, AMYLASE in the last 168 hours. No results for input(s): AMMONIA in the last 168 hours. Diabetic: No results for input(s): HGBA1C in the last 72 hours. No results for input(s): GLUCAP in the last 168 hours. Cardiac Enzymes: No results for input(s): CKTOTAL, CKMB, CKMBINDEX, TROPONINI in the last 168 hours. No results for input(s): PROBNP in the last 8760 hours. Coagulation Profile: Recent Labs  Lab 11/13/20 0539  INR 1.1   Thyroid Function Tests: No results for input(s): TSH, T4TOTAL, FREET4, T3FREE, THYROIDAB in the last 72 hours. Lipid Profile: No results for input(s): CHOL, HDL, LDLCALC, TRIG, CHOLHDL, LDLDIRECT in the last 72 hours. Anemia Panel: No results for input(s): VITAMINB12, FOLATE, FERRITIN, TIBC, IRON, RETICCTPCT in the last 72 hours. Urine analysis:    Component Value Date/Time   COLORURINE YELLOW 07/19/2014 1844   APPEARANCEUR CLOUDY (A) 07/19/2014 1844    LABSPEC 1.014 07/19/2014 1844   PHURINE 5.5 07/19/2014 1844   GLUCOSEU NEGATIVE 07/19/2014 1844   HGBUR SMALL (A) 07/19/2014 1844   BILIRUBINUR NEGATIVE 07/19/2014 1844   KETONESUR NEGATIVE 07/19/2014 1844   PROTEINUR NEGATIVE 07/19/2014 1844   UROBILINOGEN 0.2 07/19/2014 1844   NITRITE NEGATIVE 07/19/2014 1844   LEUKOCYTESUR NEGATIVE 07/19/2014 1844   Sepsis Labs: Invalid input(s): PROCALCITONIN, [place]  Microbiology: Recent Results (from the past 240 hour(s))  Resp Panel by RT-PCR (Flu A&B, Covid) Nasopharyngeal Swab     Status: None   Collection Time: 11/13/20 12:40 AM   Specimen: Nasopharyngeal Swab; Nasopharyngeal(NP) swabs in vial transport medium  Result Value Ref Range Status   SARS Coronavirus 2 by RT PCR NEGATIVE NEGATIVE Final    Comment: (NOTE) SARS-CoV-2 target nucleic acids are NOT DETECTED.  The SARS-CoV-2 RNA is generally detectable in upper respiratory specimens during the acute phase of infection. The lowest concentration of SARS-CoV-2 viral copies this assay can detect is 138 copies/mL. A negative result does not preclude SARS-Cov-2 infection and should not be used as the sole basis for treatment or other patient management decisions. A negative result may occur with  improper specimen collection/handling, submission of specimen other than nasopharyngeal swab, presence of viral mutation(s) within the areas targeted by this assay, and inadequate number of viral copies(<138 copies/mL). A negative result must be combined with clinical observations, patient history, and epidemiological information. The expected result is Negative.  Fact Sheet for Patients:  EntrepreneurPulse.com.au  Fact Sheet for Healthcare Providers:  IncredibleEmployment.be  This test is no t yet approved or cleared by the [place] FDA and  has been authorized for detection and/or diagnosis of SARS-CoV-2 by FDA under an Emergency Use  Authorization (EUA). This EUA will remain  in effect (meaning this test can be used) for the duration of the COVID-19 declaration under Section 564(b)(1) of the Act, 21 U.S.C.section 360bbb-3(b)(1), unless the authorization is terminated  or revoked sooner.       Influenza A by PCR NEGATIVE NEGATIVE Final   Influenza B by PCR NEGATIVE NEGATIVE Final    Comment: (NOTE) The Xpert Xpress SARS-CoV-2/FLU/RSV plus assay is intended as an aid in the diagnosis of influenza from  Nasopharyngeal swab specimens and should not be used as a sole basis for treatment. Nasal washings and aspirates are unacceptable for Xpert Xpress SARS-CoV-2/FLU/RSV testing.  Fact Sheet for Patients: EntrepreneurPulse.com.au  Fact Sheet for Healthcare Providers: IncredibleEmployment.be  This test is not yet approved or cleared by the [place] FDA and has been authorized for detection and/or diagnosis of SARS-CoV-2 by FDA under an Emergency Use Authorization (EUA). This EUA will remain in effect (meaning this test can be used) for the duration of the COVID-19 declaration under Section 564(b)(1) of the Act, 21 U.S.C. section 360bbb-3(b)(1), unless the authorization is terminated or revoked.  Performed at [facility], [place] [address], [place], [place] [address]   Respiratory (~20 pathogens) panel by PCR     Status: None   Collection Time: 11/13/20 12:45 PM   Specimen: Nasopharyngeal Swab; Respiratory  Result Value Ref Range Status   Adenovirus NOT DETECTED NOT DETECTED Final   Coronavirus 229E NOT DETECTED NOT DETECTED Final    Comment: (NOTE) The Coronavirus on the Respiratory Panel, DOES NOT test for the novel  Coronavirus (2019 nCoV)    Coronavirus HKU1 NOT DETECTED NOT DETECTED Final   Coronavirus NL63 NOT DETECTED NOT DETECTED Final   Coronavirus OC43 NOT DETECTED NOT DETECTED Final   Metapneumovirus NOT DETECTED NOT DETECTED Final   Rhinovirus /  Enterovirus NOT DETECTED NOT DETECTED Final   Influenza A NOT DETECTED NOT DETECTED Final   Influenza B NOT DETECTED NOT DETECTED Final   Parainfluenza Virus 1 NOT DETECTED NOT DETECTED Final   Parainfluenza Virus 2 NOT DETECTED NOT DETECTED Final   Parainfluenza Virus 3 NOT DETECTED NOT DETECTED Final   Parainfluenza Virus 4 NOT DETECTED NOT DETECTED Final   Respiratory Syncytial Virus NOT DETECTED NOT DETECTED Final   Bordetella pertussis NOT DETECTED NOT DETECTED Final   Bordetella Parapertussis NOT DETECTED NOT DETECTED Final   Chlamydophila pneumoniae NOT DETECTED NOT DETECTED Final   Mycoplasma pneumoniae NOT DETECTED NOT DETECTED Final    Comment: Performed at [facility], [place] [address], [place], [place] [address]  Expectorated Sputum Assessment w Gram Stain, Rflx to Resp Cult     Status: None   Collection Time: 11/16/20  6:23 AM   Specimen: Expectorated Sputum  Result Value Ref Range Status   Specimen Description EXPECTORATED SPUTUM  Final   Special Requests NONE  Final   Sputum evaluation   Final    THIS SPECIMEN IS ACCEPTABLE FOR SPUTUM CULTURE Performed at [facility], [place] [address], [place], [place] [address]    Report Status 11/16/2020 FINAL  Final  Culture, Respiratory w Gram Stain     Status: None (Preliminary result)   Collection Time: 11/16/20  6:23 AM  Result Value Ref Range Status   Specimen Description EXPECTORATED SPUTUM  Final   Special Requests NONE Reflexed from [id number]  Final   Gram Stain   Final    MODERATE WBC PRESENT, PREDOMINANTLY MONONUCLEAR ABUNDANT GRAM POSITIVE COCCI RARE GRAM NEGATIVE RODS    Culture   Final    CULTURE REINCUBATED FOR BETTER GROWTH Performed at [facility], [place] [address], [place], [place] [address]    Report Status PENDING  Incomplete    Radiology Studies: No results found.     Eulogio Bear DO [facility]  If 7PM-7AM, please contact night-coverage [email or web address] 11/17/2020, 11:22 AM

## 2020-11-18 LAB — BASIC METABOLIC PANEL
Anion gap: 7 (ref 5–15)
BUN: 40 mg/dL — ABNORMAL HIGH (ref 8–23)
CO2: 30 mmol/L (ref 22–32)
Calcium: 9.7 mg/dL (ref 8.9–10.3)
Chloride: 94 mmol/L — ABNORMAL LOW (ref 98–111)
Creatinine, Ser: 1.37 mg/dL — ABNORMAL HIGH (ref 0.44–1.00)
GFR, Estimated: 37 mL/min — ABNORMAL LOW (ref >60)
Glucose, Bld: 109 mg/dL — ABNORMAL HIGH (ref 70–99)
Potassium: 4.6 mmol/L (ref 3.5–5.1)
Sodium: 131 mmol/L — ABNORMAL LOW (ref 135–145)

## 2020-11-18 LAB — CULTURE, RESPIRATORY W GRAM STAIN: Culture: NORMAL

## 2020-11-18 LAB — PROCALCITONIN: Procalcitonin: <0.1 ng/mL

## 2020-11-18 MED ORDER — GUAIFENESIN ER 600 MG PO TB12: 1200.0000 mg | ORAL_TABLET | Freq: Two times a day (BID) | ORAL | 0 refills | Status: DC

## 2020-11-18 MED ORDER — AMIODARONE HCL 200 MG PO TABS: 200.0000 mg | ORAL_TABLET | Freq: Every day | ORAL | Status: DC

## 2020-11-18 MED ORDER — PREDNISONE 20 MG PO TABS: 40.0000 mg | ORAL_TABLET | Freq: Every day | ORAL | 0 refills | Status: DC

## 2020-11-18 NOTE — Discharge Summary (Signed)
Physician Discharge Summary  Heather [name] EVO:[id number] DOB: 1930/11/17 DOA: 11/12/2020  PCP: Leonard Downing, MD  Admit date: 11/12/2020 Discharge date: 11/18/2020  Admitted From: home Discharge disposition: home   Recommendations for Outpatient Follow-Up:   PFTs outpatient Cardiology follow up for echo results May benefit from PRN lasix Outpatient GI follow up Follow up pulm nodules   Discharge Diagnosis:   Principal Problem:   Shortness of breath Active Problems:   CAD (coronary artery disease) of artery bypass graft, s/p PCI stent to VG->RCA 04/30/15   Obesity (BMI 30-39.9)   Hyperlipidemia   Hypothyroidism   Hypertensive heart disease   GERD (gastroesophageal reflux disease)   Elevated brain natriuretic peptide (BNP) level   Prolonged QT interval   Atrial fibrillation, chronic (HCC)   SOB (shortness of breath)    Discharge Condition: Improved.  Diet recommendation: Low sodium, heart healthy.  Carbohydrate-modified  Wound care: None.  Code status: Full.   History of Present Illness:   Heather [name] is a 87 y.o. female with medical history significant for multivessel CAD s/p CABG and PCI , HTN, history of mitral valve repair, hypothyroidism, chronic diastolic CHF, GERD, hyperlipidemia, A. fib on Eliquis  who presents to the emergency department due to worsening shortness of breath.  Patient states that she had COVID in July and that since she recovered, she has  always some cough and slight increase work of breathing (different from baseline prior to [place]).  She states that she started having worsening shortness of breath within last few days and has not been able to lay flat due to difficulty in breathing in such position.  She complained of increased leg swelling bilaterally, however, there was not much difference from baseline increased leg swelling.  She went to her PCP and she was asked to go to the ED for further evaluation and  management.  She denies fever, chills, chest pain, nausea, vomiting or abdominal pain     Hospital Course by Problem:   Respiratory distress from progressive dyspnea and dry cough-no history of asthma or COPD.  Briefly smoked before 1960s.  Broad differential diagnosis including undiagnosed asthma, GERD, allergy, involvement of phrenic nerve irritation, amiodarone toxicity and residual from COVID-19 infection in 07/2020 or CHF with PAH.   -COVID-19, influenza and full RVP negative.   -CXR with cardiomegaly.  BNP slightly elevated.   -No pleuritic chest pain or tachycardia to suggest PE.  No eosinophilia on CBC.  She has orthopnea unchanged from baseline.  BNP slightly elevated.  TTE as above.  Has JVD on exam.  CT chest showed multiple small nodules with the largest about 3 mm but no acute infiltrate.  Some improvement after adjusting nebs and cough meds. -PCCM recommended continuing steroid and nebs.  Did not feel this is related to COVID or amiodarone. -wean to PO steroids -Scheduled Mucinex -Increase Protonix to twice daily -IS/OOB/PT/OT -pulm toilet -sputum culture normal     Acute on chronic diastolic CHF/portal hypertension/moderate mitral valve stenosis: History of mitral valve repair in 2003.  Has chronic orthopnea and mildly elevated BNP.  - IV Lasix - diuresed  -Monitor fluid status, renal functions and electrolytes. -Sodium and fluid restriction   SSS/PPM/paroxysmal A. Fib -Continue amiodarone and Eliquis   CAD/CABG in 2003 and PCI stent to VG> RCA in 04/2015: No anginal symptoms.  TTE as above.  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 -Continue Zetia   Essential hypertension:  Normotensive. -Discontinued amlodipine   Esophageal dysmotility/esophageal dysphagia-esophagram concerning for esophageal dysmotility but no stricture. -PPI as above -Outpatient follow-up with GI   History of left thyroidectomy in 1968 History of right thyromegaly: reportedly  not amenable to resection for right ENT. -Continue home Synthroid.   Prolonged QTC: 527.  Likely exaggerated by wide QRS due to RBBB -Minimize or avoid QT prolonging drugs.   Hyponatremia:  -stable -? baseline   Obesity Estimated body mass index is 34.62 kg/m as calculated from the following:   Height as of this encounter: 4\' 11"  (1.499 m).   Weight as of this encounter: 77.7 kg.     Medical Consultants:      Discharge Exam:   Vitals:   11/18/20 0028 11/18/20 0401  BP: (!) 146/63 (!) 153/77  Pulse: 68 68  Resp: 16 16  Temp: 97.8 F (36.6 C) 97.9 F (36.6 C)  SpO2: 93% 100%   Vitals:   11/17/20 2001 11/17/20 2003 11/18/20 0028 11/18/20 0401  BP:   (!) 146/63 (!) 153/77  Pulse:   68 68  Resp:   16 16  Temp:   97.8 F (36.6 C) 97.9 F (36.6 C)  TempSrc:   Oral Oral  SpO2: 94% 94% 93% 100%  Weight:    77.4 kg  Height:        General exam: Appears calm and comfortable.  The results of significant diagnostics from this hospitalization (including imaging, microbiology, ancillary and laboratory) are listed below for reference.     Procedures and Diagnostic Studies:   CT CHEST WO CONTRAST  Result Date: 11/13/2020 CLINICAL DATA:  Pneumonia. EXAM: CT CHEST WITHOUT CONTRAST TECHNIQUE: Multidetector CT imaging of the chest was performed following the standard protocol without IV contrast. COMPARISON:  June 16, 2013. FINDINGS: Cardiovascular: Status post coronary artery bypass graft. Atherosclerosis of thoracic aorta is noted. Mild cardiomegaly is noted. No pericardial effusion is noted. Mediastinum/Nodes: Stable right-sided thyromegaly is noted. No adenopathy is noted. The esophagus is unremarkable. Lungs/Pleura: No pneumothorax or pleural effusion is noted. Multiple small bilateral pulmonary nodules are noted, including 3 mm nodule seen anteriorly in right lower lobe best seen on image number 87 of series 8 which is unchanged compared to prior exam. 3 mm nodule is noted  in left lower lobe best seen on image number 86 of series 8 which is not visualized on prior exam. 3 mm nodule is noted laterally in left lower lobe best seen on image number 79 of series 8 which is not clearly visualized on prior exam. 3 mm nodule is noted in left lower lobe best seen on image number 61 of series 8 which is not seen on prior exam. No significant consolidation is noted. Upper Abdomen: No acute abnormality. Musculoskeletal: No chest wall mass or suspicious bone lesions identified. IMPRESSION: No definite evidence of consolidation is noted. Multiple small bilateral pulmonary nodules are noted, the largest measuring 3 mm. Several of these are not visualized on prior exam. No follow-up needed if patient is low-risk (and has no known or suspected primary neoplasm). Non-contrast chest CT can be considered in 12 months if patient is high-risk. This recommendation follows the consensus statement: Guidelines for Management of Incidental Pulmonary Nodules Detected on CT Images: From the Fleischner Society 2017; Radiology 2017; 284:228-243. Status post coronary bypass graft. Aortic Atherosclerosis (ICD10-I70.0). Electronically Signed   By: Marijo Conception M.D.   On: 11/13/2020 13:23   DG Chest Portable 1 View  Result Date: 11/12/2020 CLINICAL DATA:  Shortness of breath EXAM: PORTABLE CHEST 1 VIEW COMPARISON:  08/23/2019, 08/20/2019 FINDINGS: Post sternotomy changes and left-sided pacing device as before. Cardiomegaly. No pleural effusion, pneumothorax or focal airspace disease. Aortic atherosclerosis. IMPRESSION: Cardiomegaly without edema or infiltrate Electronically Signed   By: Donavan Foil M.D.   On: 11/12/2020 18:06     Labs:   Basic Metabolic Panel: Recent Labs  Lab 11/13/20 0539 11/14/20 0610 11/15/20 0302 11/16/20 0427 11/17/20 0350 11/18/20 0354  NA 135 133* 132* 131* 132* 131*  K 3.9 5.0 4.9 4.7 4.9 4.6  CL 103 98 99 99 96* 94*  CO2 22 26 25 25 28 30   GLUCOSE 137* 141* 132*  137* 140* 109*  BUN 16 18 24* 29* 35* 40*  CREATININE 0.92 1.02* 1.07* 0.95 1.19* 1.37*  CALCIUM 9.3 10.0 10.1 9.6 9.9 9.7  MG 2.0 1.8 2.2 2.1 2.1  --   PHOS 3.8  --   --  2.8 3.8  --    GFR Estimated Creatinine Clearance: 25.5 mL/min (A) (by C-G formula based on SCr of 1.37 mg/dL (H)). Liver Function Tests: Recent Labs  Lab 11/12/20 1737 11/13/20 0539 11/16/20 0427 11/17/20 0350  AST 25 25  --   --   ALT 26 22  --   --   ALKPHOS 59 56  --   --   BILITOT 1.1 1.1  --   --   PROT 7.4 6.8  --   --   ALBUMIN 4.5 4.1 3.8 4.0   No results for input(s): LIPASE, AMYLASE in the last 168 hours. No results for input(s): AMMONIA in the last 168 hours. Coagulation profile Recent Labs  Lab 11/13/20 0539  INR 1.1    CBC: Recent Labs  Lab 11/12/20 1737 11/13/20 0539 11/16/20 0427  WBC 8.3 8.9 18.3*  NEUTROABS  --   --  16.5*  HGB 13.2 12.2 12.0  HCT 40.3 36.8 35.9*  MCV 92.2 91.5 89.5  PLT 229 195 219   Cardiac Enzymes: No results for input(s): CKTOTAL, CKMB, CKMBINDEX, TROPONINI in the last 168 hours. BNP: Invalid input(s): POCBNP CBG: No results for input(s): GLUCAP in the last 168 hours. D-Dimer No results for input(s): DDIMER in the last 72 hours. Hgb A1c No results for input(s): HGBA1C in the last 72 hours. Lipid Profile No results for input(s): CHOL, HDL, LDLCALC, TRIG, CHOLHDL, LDLDIRECT in the last 72 hours. Thyroid function studies No results for input(s): TSH, T4TOTAL, T3FREE, THYROIDAB in the last 72 hours.  Invalid input(s): FREET3 Anemia work up No results for input(s): VITAMINB12, FOLATE, FERRITIN, TIBC, IRON, RETICCTPCT in the last 72 hours. Microbiology Recent Results (from the past 240 hour(s))  Resp Panel by RT-PCR (Flu A&B, Covid) Nasopharyngeal Swab     Status: None   Collection Time: 11/13/20 12:40 AM   Specimen: Nasopharyngeal Swab; Nasopharyngeal(NP) swabs in vial transport medium  Result Value Ref Range Status   SARS Coronavirus 2 by RT PCR  NEGATIVE NEGATIVE Final    Comment: (NOTE) SARS-CoV-2 target nucleic acids are NOT DETECTED.  The SARS-CoV-2 RNA is generally detectable in upper respiratory specimens during the acute phase of infection. The lowest concentration of SARS-CoV-2 viral copies this assay can detect is 138 copies/mL. A negative result does not preclude SARS-Cov-2 infection and should not be used as the sole basis for treatment or other patient management decisions. A negative result may occur with  improper specimen collection/handling, submission of specimen other than nasopharyngeal swab, presence of viral mutation(s) within the  areas targeted by this assay, and inadequate number of viral copies(<138 copies/mL). A negative result must be combined with clinical observations, patient history, and epidemiological information. The expected result is Negative.  Fact Sheet for Patients:  EntrepreneurPulse.com.au  Fact Sheet for Healthcare Providers:  IncredibleEmployment.be  This test is no t yet approved or cleared by the [place] FDA and  has been authorized for detection and/or diagnosis of SARS-CoV-2 by FDA under an Emergency Use Authorization (EUA). This EUA will remain  in effect (meaning this test can be used) for the duration of the COVID-19 declaration under Section 564(b)(1) of the Act, 21 U.S.C.section 360bbb-3(b)(1), unless the authorization is terminated  or revoked sooner.       Influenza A by PCR NEGATIVE NEGATIVE Final   Influenza B by PCR NEGATIVE NEGATIVE Final    Comment: (NOTE) The Xpert Xpress SARS-CoV-2/FLU/RSV plus assay is intended as an aid in the diagnosis of influenza from Nasopharyngeal swab specimens and should not be used as a sole basis for treatment. Nasal washings and aspirates are unacceptable for Xpert Xpress SARS-CoV-2/FLU/RSV testing.  Fact Sheet for Patients: EntrepreneurPulse.com.au  Fact Sheet for  Healthcare Providers: IncredibleEmployment.be  This test is not yet approved or cleared by the [place] FDA and has been authorized for detection and/or diagnosis of SARS-CoV-2 by FDA under an Emergency Use Authorization (EUA). This EUA will remain in effect (meaning this test can be used) for the duration of the COVID-19 declaration under Section 564(b)(1) of the Act, 21 U.S.C. section 360bbb-3(b)(1), unless the authorization is terminated or revoked.  Performed at [facility], [place] [address], [place], [place] [address]   Respiratory (~20 pathogens) panel by PCR     Status: None   Collection Time: 11/13/20 12:45 PM   Specimen: Nasopharyngeal Swab; Respiratory  Result Value Ref Range Status   Adenovirus NOT DETECTED NOT DETECTED Final   Coronavirus 229E NOT DETECTED NOT DETECTED Final    Comment: (NOTE) The Coronavirus on the Respiratory Panel, DOES NOT test for the novel  Coronavirus (2019 nCoV)    Coronavirus HKU1 NOT DETECTED NOT DETECTED Final   Coronavirus NL63 NOT DETECTED NOT DETECTED Final   Coronavirus OC43 NOT DETECTED NOT DETECTED Final   Metapneumovirus NOT DETECTED NOT DETECTED Final   Rhinovirus / Enterovirus NOT DETECTED NOT DETECTED Final   Influenza A NOT DETECTED NOT DETECTED Final   Influenza B NOT DETECTED NOT DETECTED Final   Parainfluenza Virus 1 NOT DETECTED NOT DETECTED Final   Parainfluenza Virus 2 NOT DETECTED NOT DETECTED Final   Parainfluenza Virus 3 NOT DETECTED NOT DETECTED Final   Parainfluenza Virus 4 NOT DETECTED NOT DETECTED Final   Respiratory Syncytial Virus NOT DETECTED NOT DETECTED Final   Bordetella pertussis NOT DETECTED NOT DETECTED Final   Bordetella Parapertussis NOT DETECTED NOT DETECTED Final   Chlamydophila pneumoniae NOT DETECTED NOT DETECTED Final   Mycoplasma pneumoniae NOT DETECTED NOT DETECTED Final    Comment: Performed at [facility], [place]. [address] [place], [place] [address]   Expectorated Sputum Assessment w Gram Stain, Rflx to Resp Cult     Status: None   Collection Time: 11/16/20  6:23 AM   Specimen: Expectorated Sputum  Result Value Ref Range Status   Specimen Description EXPECTORATED SPUTUM  Final   Special Requests NONE  Final   Sputum evaluation   Final    THIS SPECIMEN IS ACCEPTABLE FOR SPUTUM CULTURE Performed at [facility], [place] [address]  [place], [place] [address]    Report Status 11/16/2020 FINAL  Final  Culture, Respiratory w Gram Stain     Status: None   Collection Time: 11/16/20  6:23 AM  Result Value Ref Range Status   Specimen Description EXPECTORATED SPUTUM  Final   Special Requests NONE Reflexed from [id number]  Final   Gram Stain   Final    MODERATE WBC PRESENT, PREDOMINANTLY MONONUCLEAR ABUNDANT GRAM POSITIVE COCCI RARE GRAM NEGATIVE RODS    Culture   Final    Normal respiratory flora-no Staph aureus or Pseudomonas seen Performed at [facility], [place] [address], [place], [place] [address]    Report Status 11/18/2020 FINAL  Final     Discharge Instructions:   Discharge Instructions     Diet - low sodium heart healthy   Complete by: As directed    Discharge instructions   Complete by: As directed    Continue flutter valve/pulmonary toiletry   Increase activity slowly   Complete by: As directed       Allergies as of 11/18/2020       Reactions   Atorvastatin    Muscle ache   Codeine Nausea And Vomiting   Crestor [rosuvastatin]    Muscle ache   Pravastatin    Muscle ache   Simvastatin    Muscle cramps   Ticagrelor    Dyspnea        Medication List     TAKE these medications    acetaminophen 325 MG tablet Commonly known as: TYLENOL Take 1-2 tablets (325-650 mg total) by mouth every 6 (six) hours as needed for mild pain (pain score 1-3 or temp > 100.5).   albuterol 108 (90 Base) MCG/ACT inhaler Commonly known as: VENTOLIN HFA Inhale 1-2 puffs into the lungs 4 (four) times daily as needed for  shortness of breath or wheezing.   amiodarone 200 MG tablet Commonly known as: PACERONE Take 1 tablet (200 mg total) by mouth daily.   amLODipine 2.5 MG tablet Commonly known as: NORVASC TAKE 1 TABLET BY MOUTH ONCE DAILY NEEDS  APPOINTMENT  WITH  CARDIOLOGIST What changed: See the new instructions.   apixaban 5 MG Tabs tablet Commonly known as: Eliquis Take 1 tablet (5 mg total) by mouth 2 (two) times daily.   cholecalciferol 25 MCG (1000 UNIT) tablet Commonly known as: VITAMIN D3 Take 1,000 Units by mouth daily.   clopidogrel 75 MG tablet Commonly known as: PLAVIX Take 1 tablet (75 mg total) by mouth daily.   ezetimibe 10 MG tablet Commonly known as: ZETIA Take 1 tablet (10 mg total) by mouth daily.   famotidine 10 MG tablet Commonly known as: PEPCID Take 10 mg by mouth daily as needed for heartburn or indigestion.   guaiFENesin 600 MG 12 hr tablet Commonly known as: MUCINEX Take 2 tablets (1,200 mg total) by mouth 2 (two) times daily.   levothyroxine 50 MCG tablet Commonly known as: SYNTHROID Take 50 mcg by mouth daily before breakfast.   nitroGLYCERIN 0.4 MG SL tablet Commonly known as: NITROSTAT Place 0.4 mg under the tongue every 5 (five) minutes as needed for chest pain.   predniSONE 20 MG tablet Commonly known as: DELTASONE Take 2 tablets (40 mg total) by mouth daily with breakfast. Start taking on: November 19, 2020        Follow-up Information     Leonard Downing, MD Follow up on 11/25/2020.   Specialty: Family Medicine Why: hospital discharge follow up pcp to follow  up on lung nodules ,please discuss with your pcp regarding  repeat Non-contrast chest CT in 12 months @11 :00am Contact information: [place] [address] [phone]         Richardo Priest, MD .   Specialties: Cardiology, Radiology Contact information: [place] [address] [phone]         Vickie Epley, MD .    Specialties: Cardiology, Radiology Contact information: [place] [address] [phone]         Marshell Garfinkel, MD Follow up in 1 month(s).   Specialty: Pulmonary Disease Why: post covid cough Contact information: [place] [address] [phone]                  Time coordinating discharge: 35 min  Signed:  Geradine Girt DO  [facility] 11/18/2020, 4:15 PM

## 2020-11-18 NOTE — Plan of Care (Signed)
  Problem: Education: Goal: Knowledge of General Education information will improve Description: Including pain rating scale, medication(s)/side effects and non-pharmacologic comfort measures Outcome: Adequate for Discharge   

## 2020-11-18 NOTE — Progress Notes (Signed)
Discharge instructions (including medications) discussed with and copy provided to patient/caregiver 

## 2020-11-18 NOTE — Care Management Important Message (Signed)
Important Message  Patient Details  Name: Heather [name] MRN: [id number] Date of Birth: August 17, 1930   Medicare Important Message Given:  Yes     Shelda Altes 11/18/2020, 10:23 AM

## 2020-11-19 ENCOUNTER — Ambulatory Visit (INDEPENDENT_AMBULATORY_CARE_PROVIDER_SITE_OTHER): Payer: Medicare Other

## 2020-11-19 DIAGNOSIS — I495 Sick sinus syndrome: Secondary | ICD-10-CM

## 2020-11-19 LAB — CUP PACEART REMOTE DEVICE CHECK
Battery Remaining Longevity: 153 mo
Battery Voltage: 3.06 V
Brady Statistic AP VP Percent: 0.48 %
Brady Statistic AP VS Percent: 84.33 %
Brady Statistic AS VP Percent: 0.03 %
Brady Statistic AS VS Percent: 15.15 %
Brady Statistic RA Percent Paced: 86.02 %
Brady Statistic RV Percent Paced: 0.51 %
Date Time Interrogation Session: 20221103204621
Implantable Lead Implant Date: 20210806
Implantable Lead Implant Date: 20210806
Implantable Lead Location: 753859
Implantable Lead Location: 753860
Implantable Lead Model: 5076
Implantable Lead Model: 5076
Implantable Pulse Generator Implant Date: 20210806
Lead Channel Impedance Value: 380 Ohm
Lead Channel Impedance Value: 513 Ohm
Lead Channel Impedance Value: 532 Ohm
Lead Channel Impedance Value: 608 Ohm
Lead Channel Pacing Threshold Amplitude: 0.5 V
Lead Channel Pacing Threshold Amplitude: 0.75 V
Lead Channel Pacing Threshold Pulse Width: 0.4 ms
Lead Channel Pacing Threshold Pulse Width: 0.4 ms
Lead Channel Sensing Intrinsic Amplitude: 14.5 mV
Lead Channel Sensing Intrinsic Amplitude: 14.5 mV
Lead Channel Sensing Intrinsic Amplitude: 3.5 mV
Lead Channel Sensing Intrinsic Amplitude: 3.5 mV
Lead Channel Setting Pacing Amplitude: 1.5 V
Lead Channel Setting Pacing Amplitude: 2 V
Lead Channel Setting Pacing Pulse Width: 0.4 ms
Lead Channel Setting Sensing Sensitivity: 1.2 mV

## 2020-11-24 NOTE — Progress Notes (Signed)
Remote pacemaker transmission.   

## 2020-12-22 NOTE — Progress Notes (Signed)
Electrophysiology Office Follow up Visit Note:    Date:  12/23/2020   ID:  Heather [name], DOB [birth year], MRN [id number]  PCP:  Leonard Downing, MD  [place] Cardiologist:  Shirlee More, MD  [facility] Electrophysiologist:  Vickie Epley, MD    Interval History:    Heather [name] is a 87 y.o. female who presents for a follow up visit.  I last saw the patient May 18, 2020 for her sinus node dysfunction with permanent pacemaker in situ She is on Eliquis and amiodarone for history of atrial fibrillation.  She recently was in the emergency department with dyspnea.  Device interrogation that day showed no A. fib and a normally functioning device.  Remote interrogations since then have also shown stable device function without arrhythmia.  She presents today for follow-up.        Past Medical History:  Diagnosis Date   Anxiety attack 01/03/2016   Aphasia 01/03/2016   Arthritis    CAD (coronary artery disease) of artery bypass graft, s/p PCI stent to VG->RCA 04/30/15 07/14/2013   Catheterization August/2003 showing severe three-vessel disease and moderate mitral regurgitation 08/28/2001 CABG with internal mammary graft to LAD, vein graft to circumflex, and vein graft to posterolateral branch and mitral valve repair by Dr. 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 Patent internal ma   CAD (coronary artery disease), native coronary artery 07/14/2013   Catheterization August/2003 showing severe three-vessel disease and moderate mitral regurgitation 08/28/2001 CABG with internal mammary graft to LAD, vein graft to circumflex, and vein graft to posterolateral branch and mitral valve repair by Dr. 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 Patent internal mammary graft with competitive flow in the  distal vessel, occluded right coronary artery, occluded circumflex, patent saphenous vein graft to circumflex with mild/moderate disease proximally, minimal LV damage    Chest pain 08/20/2019   Chronic diastolic CHF (congestive heart failure) ([place])    a. EF 60% by LV gram 01/2009.   Coronary artery disease    a. s/p MI in 1980;  b. 2003 CABG x 3 (LIMA->LAD, VG->OM, VG->RPL);  c. 01/2009 Cath: 3/3 patent grafts, native 3VD, EF 60%.   Diverticulitis    Dyspnea 01/03/2016   Elevated troponin    GERD (gastroesophageal reflux disease)    H/O mitral valve repair    a. 2003   History of kidney stones    Hyperlipidemia 07/14/2013   Intolerance to multiple statins including Crestor, pravastatin, and Lipitor    Hypertension    Hypertension, uncontrolled 07/19/2014   Hypertensive heart disease 07/14/2013   Hypothyroidism    NSTEMI (non-ST elevated myocardial infarction) ([place])    Other hyperlipidemia    PAF (paroxysmal atrial fibrillation) ([place])    Pneumonia    Presence of permanent cardiac pacemaker 08/22/2019   S/P partial hysterectomy 1977   Sinus node dysfunction [facility])     Past Surgical History:  Procedure Laterality Date   ABDOMINAL HYSTERECTOMY  1977   APPENDECTOMY  1950   BLADDER SUSPENSION     tac   CARDIAC CATHETERIZATION  03,05,11   CARDIAC CATHETERIZATION N/A 04/30/2015   Procedure: Left Heart Cath and Cors/Grafts Angiography;  Surgeon: Belva Crome, MD;  Location: [place] CV LAB;  Service: Cardiovascular;  Laterality: N/A;   CARDIAC CATHETERIZATION N/A 04/30/2015  Procedure: Coronary Stent Intervention;  Surgeon: Belva Crome, MD;  Location: [place] CV LAB;  Service: Cardiovascular;  Laterality: N/A;  Proximal and Distal SVG to the PLA   CARDIAC CATHETERIZATION N/A 01/06/2016   Procedure: Left Heart Cath and Cors/Grafts Angiography;  Surgeon: Jettie Booze, MD;  Location: [place] CV LAB;  Service: Cardiovascular;  Laterality: N/A;   CARPAL TUNNEL RELEASE     right  and left   CHOLECYSTECTOMY     CORONARY ARTERY BYPASS GRAFT  2003   KNEE ARTHROSCOPY     left   LEFT HEART CATHETERIZATION WITH CORONARY ANGIOGRAM N/A 07/12/2013   Procedure: LEFT HEART CATHETERIZATION WITH CORONARY ANGIOGRAM;  Surgeon: Jettie Booze, MD;  Location: [facility] CATH LAB;  Service: Cardiovascular;  Laterality: N/A;   LEFT TOTAL HIP ARTHROPLASTY ANTERIOR APPROACH (Left Hip)  03/16/2020   MITRAL VALVE REPAIR  2003   with cabg   PACEMAKER IMPLANT N/A 08/22/2019   Procedure: PACEMAKER IMPLANT;  Surgeon: Constance Haw, MD;  Location: [place] CV LAB;  Service: Cardiovascular;  Laterality: N/A;   THYROIDECTOMY  1968   THYROIDECTOMY, PARTIAL     left   TOTAL HIP ARTHROPLASTY Left 03/16/2020   Procedure: LEFT TOTAL HIP ARTHROPLASTY ANTERIOR APPROACH;  Surgeon: Mcarthur Rossetti, MD;  Location: [place];  Service: Orthopedics;  Laterality: Left;    Current Medications: Current Meds  Medication Sig   acetaminophen (TYLENOL) 325 MG tablet Take 1-2 tablets (325-650 mg total) by mouth every 6 (six) hours as needed for mild pain (pain score 1-3 or temp > 100.5).   albuterol (VENTOLIN HFA) 108 (90 Base) MCG/ACT inhaler Inhale 1-2 puffs into the lungs 4 (four) times daily as needed for shortness of breath or wheezing.   amiodarone (PACERONE) 200 MG tablet Take 1 tablet (200 mg total) by mouth daily.   amLODipine (NORVASC) 2.5 MG tablet TAKE 1 TABLET BY MOUTH ONCE DAILY NEEDS  APPOINTMENT  WITH  CARDIOLOGIST (Patient taking differently: Take 2.5 mg by mouth at bedtime.)   apixaban (ELIQUIS) 5 MG TABS tablet Take 1 tablet (5 mg total) by mouth 2 (two) times daily.   cholecalciferol (VITAMIN D3) 25 MCG (1000 UNIT) tablet Take 1,000 Units by mouth daily.   clopidogrel (PLAVIX) 75 MG tablet Take 1 tablet (75 mg total) by mouth daily.   ezetimibe (ZETIA) 10 MG tablet Take 1 tablet (10 mg total) by mouth daily.   famotidine (PEPCID) 10 MG tablet Take 10 mg by mouth daily as needed for  heartburn or indigestion.   furosemide (LASIX) 20 MG tablet Take 20 mg by mouth daily as needed.   guaiFENesin (MUCINEX) 600 MG 12 hr tablet Take 2 tablets (1,200 mg total) by mouth 2 (two) times daily.   levothyroxine (SYNTHROID, LEVOTHROID) 50 MCG tablet Take 50 mcg by mouth daily before breakfast.   nitroGLYCERIN (NITROSTAT) 0.4 MG SL tablet Place 0.4 mg under the tongue every 5 (five) minutes as needed for chest pain.   predniSONE (DELTASONE) 20 MG tablet Take 2 tablets (40 mg total) by mouth daily with breakfast.     Allergies:   Atorvastatin, Codeine, Crestor [rosuvastatin], Pravastatin, Simvastatin, and Ticagrelor   Social History   Socioeconomic History   Marital status: Widowed    Spouse name: Not on file   Number of children: Not on file   Years of education: Not on file   Highest education level: Not on file  Occupational History   Not on file  Tobacco Use  Smoking status: Former    Types: Cigarettes    Quit date: 06/06/1967    Years since quitting: 53.5   Smokeless tobacco: Former  Scientific laboratory technician Use: Never used  Substance and Sexual Activity   Alcohol use: No   Drug use: No   Sexual activity: Not Currently  Other Topics Concern   Not on file  Social History Narrative   Lives in [place].  Widowed.   Social Determinants of Health   Financial Resource Strain: Not on file  Food Insecurity: Not on file  Transportation Needs: Not on file  Physical Activity: Not on file  Stress: Not on file  Social Connections: Not on file     Family History: The patient's family history includes Dementia in her mother and sister; Heart Problems in her father, paternal grandfather, and sister; Other in an other family member; Tuberculosis in her maternal grandmother.  ROS:   Please see the history of present illness.    All other systems reviewed and are negative.  EKGs/Labs/Other Studies Reviewed:    The following studies were reviewed today:  December 23, 2020 in  clinic device interrogation personally reviewed Longevity 12.4 years Lead parameter stable Atrial paced 92.1% Ventricular paced 0.3% No arrhythmia episodes 60-110   EKG:  The ekg ordered today demonstrates it is atrial paced, ventricular sensed  Recent Labs: 05/18/2020: TSH 0.597 11/13/2020: ALT 22 11/16/2020: Hemoglobin 12.0; Platelets 219 11/17/2020: B Natriuretic Peptide 132.9; Magnesium 2.1 11/18/2020: BUN 40; Creatinine, Ser 1.37; Potassium 4.6; Sodium 131  Recent Lipid Panel    Component Value Date/Time   CHOL 180 04/30/2015 0548   TRIG 249 (H) 04/30/2015 0548   HDL 27 (L) 04/30/2015 0548   CHOLHDL 6.7 04/30/2015 0548   VLDL 50 (H) 04/30/2015 0548   LDLCALC 103 (H) 04/30/2015 0548    Physical Exam:    VS:  BP 132/84   Pulse 66   Ht 4\' 11"  (1.499 m)   Wt 177 lb 9.6 oz (80.6 kg)   SpO2 98%   BMI 35.87 kg/m     Wt Readings from Last 3 Encounters:  12/23/20 177 lb 9.6 oz (80.6 kg)  11/18/20 170 lb 11.2 oz (77.4 kg)  07/29/20 176 lb 6.4 oz (80 kg)     GEN:  Well nourished, well developed in no acute distress HEENT: Normal NECK: No JVD; No carotid bruits LYMPHATICS: No lymphadenopathy CARDIAC: RRR, no murmurs, rubs, gallops.  Pacemaker pocket well-healed RESPIRATORY:  Clear to auscultation without rales, wheezing or rhonchi  ABDOMEN: Soft, non-tender, non-distended MUSCULOSKELETAL:  No edema; No deformity  SKIN: Warm and dry NEUROLOGIC:  Alert and oriented x 3 PSYCHIATRIC:  Normal affect        ASSESSMENT:    1. Sinus node dysfunction (HCC)   2. Pacemaker   3. Hypertensive heart disease with chronic diastolic congestive heart failure ([place])   4. PAF (paroxysmal atrial fibrillation) ([place])   5. On amiodarone therapy    PLAN:    In order of problems listed above:  #Sinus node dysfunction post permanent pacemaker Pacemaker is functioning appropriately.  No changes made on today's pacemaker check.  Continue remote  monitoring  #Hypertension Controlled Continue current regimen  #Atrial fibrillation On Eliquis 5 mg by mouth twice daily  #Amiodarone use We will check a CMP, TSH and free T4 today   Follow-up 1 year with APP      Medication Adjustments/Labs and Tests Ordered: Current medicines are reviewed at length with the patient  today.  Concerns regarding medicines are outlined above.  Orders Placed This Encounter  Procedures   EKG 12-Lead   No orders of the defined types were placed in this encounter.    Signed, Lars Mage, MD, [facility], [facility] 12/23/2020 9:10 AM    Electrophysiology [facility]

## 2020-12-23 ENCOUNTER — Other Ambulatory Visit: Payer: Self-pay

## 2020-12-23 ENCOUNTER — Ambulatory Visit (INDEPENDENT_AMBULATORY_CARE_PROVIDER_SITE_OTHER): Payer: Medicare Other | Admitting: Cardiology

## 2020-12-23 ENCOUNTER — Encounter: Payer: Self-pay | Admitting: Cardiology

## 2020-12-23 VITALS — BP 132/84 | HR 66 | Ht 59.0 in | Wt 177.6 lb

## 2020-12-23 DIAGNOSIS — I495 Sick sinus syndrome: Secondary | ICD-10-CM

## 2020-12-23 DIAGNOSIS — I480 Paroxysmal atrial fibrillation: Secondary | ICD-10-CM

## 2020-12-23 DIAGNOSIS — Z79899 Other long term (current) drug therapy: Secondary | ICD-10-CM | POA: Diagnosis not present

## 2020-12-23 DIAGNOSIS — I110 Hypertensive heart disease with heart failure: Secondary | ICD-10-CM | POA: Diagnosis not present

## 2020-12-23 DIAGNOSIS — Z950 Presence of cardiac pacemaker: Secondary | ICD-10-CM

## 2020-12-23 DIAGNOSIS — I5032 Chronic diastolic (congestive) heart failure: Secondary | ICD-10-CM

## 2020-12-23 LAB — TSH: TSH: 1.26 u[IU]/mL (ref 0.450–4.500)

## 2020-12-23 LAB — COMPREHENSIVE METABOLIC PANEL
ALT: 23 IU/L (ref 0–32)
AST: 26 IU/L (ref 0–40)
Albumin/Globulin Ratio: 2.2 (ref 1.2–2.2)
Albumin: 4.6 g/dL (ref 3.6–4.6)
Alkaline Phosphatase: 73 IU/L (ref 44–121)
BUN/Creatinine Ratio: 10 — ABNORMAL LOW (ref 12–28)
BUN: 10 mg/dL (ref 8–27)
Bilirubin Total: 0.7 mg/dL (ref 0.0–1.2)
CO2: 26 mmol/L (ref 20–29)
Calcium: 9.6 mg/dL (ref 8.7–10.3)
Chloride: 100 mmol/L (ref 96–106)
Creatinine, Ser: 0.98 mg/dL (ref 0.57–1.00)
Globulin, Total: 2.1 g/dL (ref 1.5–4.5)
Glucose: 99 mg/dL (ref 70–99)
Potassium: 4.8 mmol/L (ref 3.5–5.2)
Sodium: 140 mmol/L (ref 134–144)
Total Protein: 6.7 g/dL (ref 6.0–8.5)
eGFR: 56 mL/min/{1.73_m2} — ABNORMAL LOW (ref >59)

## 2020-12-23 LAB — T4, FREE: Free T4: 1.69 ng/dL (ref 0.82–1.77)

## 2020-12-23 NOTE — Patient Instructions (Addendum)
Medication Instructions:  Your physician recommends that you continue on your current medications as directed. Please refer to the Current Medication list given to you today. *If you need a refill on your cardiac medications before your next appointment, please call your pharmacy*  Lab Work: CMP, TSH, Free T4 If you have labs (blood work) drawn today and your tests are completely normal, you will receive your results only by: [place] (if you have MyChart) OR A paper copy in the mail If you have any lab test that is abnormal or we need to change your treatment, we will call you to review the results.  Testing/Procedures: None.  Follow-Up: At [facility], you and your health needs are our priority.  As part of our continuing mission to provide you with exceptional heart care, we have created designated Provider Care Teams.  These Care Teams include your primary Cardiologist (physician) and Advanced Practice Providers (APPs -  Physician Assistants and Nurse Practitioners) who all work together to provide you with the care you need, when you need it.  Your physician wants you to follow-up in: 12 months with   one of the following Advanced Practice Providers on your designated Care Team:    Tommye Standard, PA-C Legrand Como "Jonni Sanger" [place], [place]   You will receive a reminder letter in the mail two months in advance. If you don't receive a letter, please call our office to schedule the follow-up appointment.  Remote monitoring is used to monitor your Pacemaker from home. This monitoring reduces the number of office visits required to check your device to one time per year. It allows [place] to keep an eye on the functioning of your device to ensure it is working properly. You are scheduled for a device check from home on 03/10/21. You may send your transmission at any time that day. If you have a wireless device, the transmission will be sent automatically. After your physician reviews your  transmission, you will receive a postcard with your next transmission date.  We recommend signing up for the patient portal called "MyChart".  Sign up information is provided on this After Visit Summary.  MyChart is used to connect with patients for Virtual Visits (Telemedicine).  Patients are able to view lab/test results, encounter notes, upcoming appointments, etc.  Non-urgent messages can be sent to your provider as well.   To learn more about what you can do with MyChart, go to [email or web address].    Any Other Special Instructions Will Be Listed Below (If Applicable).

## 2020-12-27 ENCOUNTER — Other Ambulatory Visit: Payer: Self-pay

## 2020-12-27 ENCOUNTER — Encounter: Payer: Self-pay | Admitting: Pulmonary Disease

## 2020-12-27 ENCOUNTER — Ambulatory Visit: Payer: Medicare Other | Admitting: Pulmonary Disease

## 2020-12-27 VITALS — BP 138/76 | HR 58 | Temp 98.1°F | Ht 59.0 in | Wt 177.0 lb

## 2020-12-27 DIAGNOSIS — R911 Solitary pulmonary nodule: Secondary | ICD-10-CM

## 2020-12-27 NOTE — Progress Notes (Signed)
Heather [name]    [id number]    01-Sep-1930  Primary Care Physician:Elkins, Curt Jews, MD  Referring Physician: Leonard Downing, MD [address] [place],  [place] [address]  Chief complaint: Consult for lung nodules  HPI: 87 year old with history of coronary artery disease, CHF, obesity, hypertension hypothyroidism, hyperlipidemia She was admitted at the end of October 2022 for dyspnea, heart failure.  She was treated with IV Lasix and diuresed with improvement in symptoms.  Also given steroids and nebs for possible COPD  Today in the office she states that her breathing is back to normal.  No new complaints History notable for COVID-19 in July 2022  Pets: Cat Occupation: Used to work in [employer] Exposures: No mold, hot tub, Customer service manager.  No feather pillows or comforter Smoking history: Had minimal smoking history in the 1950s Travel history: No significant travel history Relevant family history: No family history of lung disease   Outpatient Encounter Medications as of 12/27/2020  Medication Sig   acetaminophen (TYLENOL) 325 MG tablet Take 1-2 tablets (325-650 mg total) by mouth every 6 (six) hours as needed for mild pain (pain score 1-3 or temp > 100.5).   albuterol (VENTOLIN HFA) 108 (90 Base) MCG/ACT inhaler Inhale 1-2 puffs into the lungs 4 (four) times daily as needed for shortness of breath or wheezing.   amiodarone (PACERONE) 200 MG tablet Take 1 tablet (200 mg total) by mouth daily.   amLODipine (NORVASC) 2.5 MG tablet TAKE 1 TABLET BY MOUTH ONCE DAILY NEEDS  APPOINTMENT  WITH  CARDIOLOGIST (Patient taking differently: Take 2.5 mg by mouth at bedtime.)   cholecalciferol (VITAMIN D3) 25 MCG (1000 UNIT) tablet Take 1,000 Units by mouth daily.   clopidogrel (PLAVIX) 75 MG tablet Take 1 tablet (75 mg total) by mouth daily.   famotidine (PEPCID) 10 MG tablet Take 10 mg by mouth daily as needed for heartburn or indigestion.   furosemide (LASIX) 20 MG tablet  Take 20 mg by mouth daily as needed.   guaiFENesin (MUCINEX) 600 MG 12 hr tablet Take 2 tablets (1,200 mg total) by mouth 2 (two) times daily.   levothyroxine (SYNTHROID, LEVOTHROID) 50 MCG tablet Take 50 mcg by mouth daily before breakfast.   [DISCONTINUED] predniSONE (DELTASONE) 20 MG tablet Take 2 tablets (40 mg total) by mouth daily with breakfast.   apixaban (ELIQUIS) 5 MG TABS tablet Take 1 tablet (5 mg total) by mouth 2 (two) times daily.   ezetimibe (ZETIA) 10 MG tablet Take 1 tablet (10 mg total) by mouth daily.   nitroGLYCERIN (NITROSTAT) 0.4 MG SL tablet Place 0.4 mg under the tongue every 5 (five) minutes as needed for chest pain. (Patient not taking: Reported on 12/27/2020)   No facility-administered encounter medications on file as of 12/27/2020.    Allergies as of 12/27/2020 - Review Complete 12/27/2020  Allergen Reaction Noted   Atorvastatin  07/14/2013   Codeine Nausea And Vomiting 08/31/2010   Crestor [rosuvastatin]  07/14/2013   Pravastatin  07/14/2013   Simvastatin  07/17/2013   Ticagrelor  07/17/2013    Past Medical History:  Diagnosis Date   Anxiety attack 01/03/2016   Aphasia 01/03/2016   Arthritis    CAD (coronary artery disease) of artery bypass graft, s/p PCI stent to VG->RCA 04/30/15 07/14/2013   Catheterization August/2003 showing severe three-vessel disease and moderate mitral regurgitation 08/28/2001 CABG with internal mammary graft to LAD, vein graft to circumflex, and vein graft to posterolateral branch and  mitral valve repair by Dr. 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 Patent internal ma   CAD (coronary artery disease), native coronary artery 07/14/2013   Catheterization August/2003 showing severe three-vessel disease and moderate mitral regurgitation 08/28/2001 CABG with internal mammary graft to LAD, vein graft to circumflex, and vein graft to posterolateral  branch and mitral valve repair by Dr. 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 Patent internal mammary graft with competitive flow in the distal vessel, occluded right coronary artery, occluded circumflex, patent saphenous vein graft to circumflex with mild/moderate disease proximally, minimal LV damage    Chest pain 08/20/2019   Chronic diastolic CHF (congestive heart failure) ([place])    a. EF 60% by LV gram 01/2009.   Coronary artery disease    a. s/p MI in 1980;  b. 2003 CABG x 3 (LIMA->LAD, VG->OM, VG->RPL);  c. 01/2009 Cath: 3/3 patent grafts, native 3VD, EF 60%.   Diverticulitis    Dyspnea 01/03/2016   Elevated troponin    GERD (gastroesophageal reflux disease)    H/O mitral valve repair    a. 2003   History of kidney stones    Hyperlipidemia 07/14/2013   Intolerance to multiple statins including Crestor, pravastatin, and Lipitor    Hypertension    Hypertension, uncontrolled 07/19/2014   Hypertensive heart disease 07/14/2013   Hypothyroidism    NSTEMI (non-ST elevated myocardial infarction) ([place])    Other hyperlipidemia    PAF (paroxysmal atrial fibrillation) ([place])    Pneumonia    Presence of permanent cardiac pacemaker 08/22/2019   S/P partial hysterectomy 1977   Sinus node dysfunction [facility])     Past Surgical History:  Procedure Laterality Date   ABDOMINAL HYSTERECTOMY  1977   APPENDECTOMY  1950   BLADDER SUSPENSION     tac   CARDIAC CATHETERIZATION  03,05,11   CARDIAC CATHETERIZATION N/A 04/30/2015   Procedure: Left Heart Cath and Cors/Grafts Angiography;  Surgeon: Belva Crome, MD;  Location: [place] CV LAB;  Service: Cardiovascular;  Laterality: N/A;   CARDIAC CATHETERIZATION N/A 04/30/2015   Procedure: Coronary Stent Intervention;  Surgeon: Belva Crome, MD;  Location: [place] CV LAB;  Service: Cardiovascular;  Laterality: N/A;  Proximal and Distal SVG to the PLA   CARDIAC  CATHETERIZATION N/A 01/06/2016   Procedure: Left Heart Cath and Cors/Grafts Angiography;  Surgeon: Jettie Booze, MD;  Location: [place] CV LAB;  Service: Cardiovascular;  Laterality: N/A;   CARPAL TUNNEL RELEASE     right and left   CHOLECYSTECTOMY     CORONARY ARTERY BYPASS GRAFT  2003   KNEE ARTHROSCOPY     left   LEFT HEART CATHETERIZATION WITH CORONARY ANGIOGRAM N/A 07/12/2013   Procedure: LEFT HEART CATHETERIZATION WITH CORONARY ANGIOGRAM;  Surgeon: Jettie Booze, MD;  Location[facility] C[facility] LAB;  Service: Cardiovascular;  Laterality: N/A;   LEFT TOTAL HIP ARTHROPLASTY ANTERIOR APPROACH (Left Hip)  03/16/2020   MITRAL VALVE REPAIR  2003   with cabg   PACEMAKER IMPLANT N/A 08/22/2019   Procedure: PACEMAKER IMPLANT;  Surgeon: Constance Haw, MD;  Location: [place] CV LAB;  Service: Cardiovascular;  Laterality: N/A;   THYROIDECTOMY  1968   THYROIDECTOMY, PARTIAL     left   TOTAL HIP ARTHROPLASTY Left 03/16/2020   Procedure: LEFT TOTAL HIP ARTHROPLASTY ANTERIOR APPROACH;  Surgeon: Mcarthur Rossetti, MD;  Location: [place];  Service: Orthopedics;  Laterality: Left;    Family History  Problem Relation Age of Onset   Dementia Mother    Heart Problems Father    Dementia Sister    Heart Problems Sister    Other Other        no premature CAD.   Tuberculosis Maternal Grandmother    Heart Problems Paternal Grandfather     Social History   Socioeconomic History   Marital status: Widowed    Spouse name: Not on file   Number of children: Not on file   Years of education: Not on file   Highest education level: Not on file  Occupational History   Not on file  Tobacco Use   Smoking status: Former    Types: Cigarettes    Quit date: 06/06/1967    Years since quitting: 53.5   Smokeless tobacco: Former  Scientific laboratory technician Use: Never used  Substance and Sexual Activity   Alcohol use: No   Drug use: No   Sexual activity: Not Currently  Other Topics Concern    Not on file  Social History Narrative   Lives in [place].  Widowed.   Social Determinants of Health   Financial Resource Strain: Not on file  Food Insecurity: Not on file  Transportation Needs: Not on file  Physical Activity: Not on file  Stress: Not on file  Social Connections: Not on file  Intimate Partner Violence: Not on file    Review of systems: Review of Systems  Constitutional: Negative for fever and chills.  HENT: Negative.   Eyes: Negative for blurred vision.  Respiratory: as per HPI  Cardiovascular: Negative for chest pain and palpitations.  Gastrointestinal: Negative for vomiting, diarrhea, blood per rectum. Genitourinary: Negative for dysuria, urgency, frequency and hematuria.  Musculoskeletal: Negative for myalgias, back pain and joint pain.  Skin: Negative for itching and rash.  Neurological: Negative for dizziness, tremors, focal weakness, seizures and loss of consciousness.  Endo/Heme/Allergies: Negative for environmental allergies.  Psychiatric/Behavioral: Negative for depression, suicidal ideas and hallucinations.  All other systems reviewed and are negative.  Physical Exam: Blood pressure 138/76, pulse (!) 58, temperature 98.1 F (36.7 C), temperature source Oral, height 4\' 11"  (1.499 m), weight 177 lb (80.3 kg), SpO2 100 %. Gen:      No acute distress HEENT:  EOMI, sclera anicteric Neck:     No masses; no thyromegaly Lungs:    Clear to auscultation bilaterally; normal respiratory effort CV:         Regular rate and rhythm; no murmurs Abd:      + bowel sounds; soft, non-tender; no palpable masses, no distension Ext:    No edema; adequate peripheral perfusion Skin:      Warm and dry; no rash Neuro: alert and oriented x 3 Psych: normal mood and affect  Data Reviewed: Imaging: CT chest 11/13/2020- Lungs are clear.  Multiple tiny lung nodules measuring up to 3 mm.  Aortic atherosclerosis.  I have reviewed the images personally.  PFTs:  Labs: CBC  with differential 09/15/2019-WBC 58, eos 2%, absolute eosinophil count 116  Assessment:  Lung nodules I have reviewed the scan which shows 3 mm lung nodules.  These are likely benign as she has minimal smoking history.  Her breathing is improved after her recent hospitalization. I do not suspect COPD or asthma  Reviewed findings in detail with patient.  We discussed follow-up CT in 1 year  but she would like to avoid additional scans Follow-up as needed   Plan/Recommendations: Follow-up in pulmonary clinic as needed  Marshell Garfinkel MD [facility] 12/27/2020, 10:48 AM  CC: Leonard Downing, *

## 2021-02-14 NOTE — Progress Notes (Signed)
Cardiology Office Note:    Date:  02/15/2021   ID:  Heather [name], DOB 1930-10-26, MRN [id number]  PCP:  Leonard Downing, MD  Cardiologist:  Shirlee More, MD    Referring MD: Leonard Downing, *    ASSESSMENT:    1. Hypertensive heart disease with chronic diastolic congestive heart failure ([place])   2. Pacemaker   3. PAF (paroxysmal atrial fibrillation) ([place])   4. On amiodarone therapy   5. Chronic anticoagulation    PLAN:    In order of problems listed above:  Mrs. Heather [name] is doing well following her recent hospitalization heart failure is compensated restrict salt weighs daily compliant with her diuretic continue the same and we will reach out to access labs done in her PCPs office after discharge Stable atrial fibrillation maintaining sinus rhythm on low-dose amiodarone continue amiodarone at current anticoagulant Stable device function followed in our clinic   Next appointment: 4 months with me   Medication Adjustments/Labs and Tests Ordered: Current medicines are reviewed at length with the patient today.  Concerns regarding medicines are outlined above.  No orders of the defined types were placed in this encounter.  No orders of the defined types were placed in this encounter.   Chief Complaint  Patient presents with   Follow-up  Recent hospitalization heart failure  History of Present Illness:    Heather [name] is a 88 y.o. female with a hx of CAD with CABG and mitral valve repair 2003 inferior ST elevation MI 2015 with PCI and DES to the saphenous vein graft to the posterolateral artery paroxysmal atrial fibrillation and permanent pacemaker hyperlipidemia statin intolerance hyperlipidemia on amiodarone  last seen 07/29/2020.  She was admitted to [facility] 11/12/2020 for heart failure.  Compliance with diet, lifestyle and medications: Yes  Since returning home her weight is stable no edema shortness of breath. In retrospect her diuretic had been  discontinued after pacemaker insertion leading to hospitalization She had labs done at her PCPs office requesting a copy since discharge No chest pain shortness of breath palpitation or syncope I reviewed her calcific mitral valve disease with her there is no indication to consider surgical intervention.  Echo 11/18/2020: 1. Left ventricular ejection fraction, by estimation, is 60 to 65%. The  left ventricle has normal function. The left ventricle has no regional  wall motion abnormalities. There is mild concentric left ventricular  hypertrophy. Left ventricular diastolic  function could not be evaluated. There is hypokinesis of the left  ventricular, basal inferior wall.   2. Right ventricular systolic function is normal. The right ventricular  size is normal. There is moderately elevated pulmonary artery systolic  pressure. The estimated right ventricular systolic pressure is 45.8 mmHg.   3. S/P MV repair. There is mild thickening of the mitral valve  leaflet(s). There is diastolic doming of the anterior MV leaflet. The  posterior leaflet appears fixed.      No evidence of mitral valve regurgitation. Moderate mitral stenosis.  The mean mitral valve gradient is 6.5 mmHg. Moderate mitral annular  calcification.   4. The aortic valve is calcified. There is moderate calcification of the  aortic valve. There is moderate thickening of the aortic valve. Aortic  valve regurgitation is not visualized. Mild to moderate aortic valve  sclerosis/calcification is present,  without any evidence of aortic stenosis. Aortic valve mean gradient  measures 7.0 mmHg. Aortic valve Vmax measures 1.88 m/s.   5. The inferior vena cava is dilated in size  with >50% respiratory  variability, suggesting right atrial pressure of 8 mmHg.   6. Compared to study dated 08/21/2019, mitral stenosis is now moderate with  increase in mean MV gradient from 5 to 18mmHg and there is now moderate  pulmonary HTN.  Past Medical  History:  Diagnosis Date   Anxiety attack 01/03/2016   Aphasia 01/03/2016   Arthritis    CAD (coronary artery disease) of artery bypass graft, s/p PCI stent to VG->RCA 04/30/15 07/14/2013   Catheterization August/2003 showing severe three-vessel disease and moderate mitral regurgitation 08/28/2001 CABG with internal mammary graft to LAD, vein graft to circumflex, and vein graft to posterolateral branch and mitral valve repair by Dr. 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 Patent internal ma   CAD (coronary artery disease), native coronary artery 07/14/2013   Catheterization August/2003 showing severe three-vessel disease and moderate mitral regurgitation 08/28/2001 CABG with internal mammary graft to LAD, vein graft to circumflex, and vein graft to posterolateral branch and mitral valve repair by Dr. 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 Patent internal mammary graft with competitive flow in the distal vessel, occluded right coronary artery, occluded circumflex, patent saphenous vein graft to circumflex with mild/moderate disease proximally, minimal LV damage    Chest pain 08/20/2019   Chronic diastolic CHF (congestive heart failure) ([place])    a. EF 60% by LV gram 01/2009.   Coronary artery disease    a. s/p MI in 1980;  b. 2003 CABG x 3 (LIMA->LAD, VG->OM, VG->RPL);  c. 01/2009 Cath: 3/3 patent grafts, native 3VD, EF 60%.   Diverticulitis    Dyspnea 01/03/2016   Elevated troponin    GERD (gastroesophageal reflux disease)    H/O mitral valve repair    a. 2003   History of kidney stones    Hyperlipidemia 07/14/2013   Intolerance to multiple statins including Crestor, pravastatin, and Lipitor    Hypertension    Hypertension, uncontrolled 07/19/2014   Hypertensive heart disease 07/14/2013   Hypothyroidism    NSTEMI  (non-ST elevated myocardial infarction) ([place])    Other hyperlipidemia    PAF (paroxysmal atrial fibrillation) ([place])    Pneumonia    Presence of permanent cardiac pacemaker 08/22/2019   S/P partial hysterectomy 1977   Sinus node dysfunction [facility])     Past Surgical History:  Procedure Laterality Date   ABDOMINAL HYSTERECTOMY  1977   APPENDECTOMY  1950   BLADDER SUSPENSION     tac   CARDIAC CATHETERIZATION  03,05,11   CARDIAC CATHETERIZATION N/A 04/30/2015   Procedure: Left Heart Cath and Cors/Grafts Angiography;  Surgeon: Belva Crome, MD;  Location: [place] CV [facility];  Service: Cardiovascular;  Laterality: N/A;   CARDIAC CATHETERIZATION N/A 04/30/2015   Procedure: Coronary Stent Intervention;  Surgeon: Belva Crome, MD;  Location: [place] [facility];  Service: Cardiovascular;  Laterality: N/A;  Proximal and Distal SVG to the PLA   CARDIAC CATHETERIZATION N/A 01/06/2016   Procedure: Left Heart Cath and Cors/Grafts Angiography;  Surgeon: Jettie Booze, MD;  Location: [place] [facility];  Service: Cardiovascular;  Laterality: N/A;   CARPAL TUNNEL RELEASE     right and left   CHOLECYSTECTOMY     CORONARY ARTERY BYPASS GRAFT  2003   KNEE ARTHROSCOPY     left   LEFT HEART CATHETERIZATION WITH  CORONARY ANGIOGRAM N/A 07/12/2013   Procedure: LEFT HEART CATHETERIZATION WITH CORONARY ANGIOGRAM;  Surgeon: Jettie Booze, MD;  Location[facility] C[facility] LAB;  Service: Cardiovascular;  Laterality: N/A;   LEFT TOTAL HIP ARTHROPLASTY ANTERIOR APPROACH (Left Hip)  03/16/2020   MITRAL VALVE REPAIR  2003   with cabg   PACEMAKER IMPLANT N/A 08/22/2019   Procedure: PACEMAKER IMPLANT;  Surgeon: Constance Haw, MD;  Location: [place] [facility];  Service: Cardiovascular;  Laterality: N/A;   THYROIDECTOMY  1968   THYROIDECTOMY, PARTIAL     left   TOTAL HIP ARTHROPLASTY Left 03/16/2020   Procedure: LEFT TOTAL HIP ARTHROPLASTY ANTERIOR APPROACH;  Surgeon: Mcarthur Rossetti, MD;  Location: [place];  Service: Orthopedics;  Laterality: Left;    Current Medications: Current Meds  Medication Sig   acetaminophen (TYLENOL) 325 MG tablet Take 1-2 tablets (325-650 mg total) by mouth every 6 (six) hours as needed for mild pain (pain score 1-3 or temp > 100.5).   albuterol (VENTOLIN HFA) 108 (90 Base) MCG/ACT inhaler Inhale 1-2 puffs into the lungs 4 (four) times daily as needed for shortness of breath or wheezing.   amiodarone (PACERONE) 200 MG tablet Take 1 tablet (200 mg total) by mouth daily.   amLODipine (NORVASC) 2.5 MG tablet Take 2.5 mg by mouth daily.   apixaban (ELIQUIS) 5 MG TABS tablet Take 1 tablet (5 mg total) by mouth 2 (two) times daily.   cholecalciferol (VITAMIN D3) 25 MCG (1000 UNIT) tablet Take 1,000 Units by mouth daily.   clopidogrel (PLAVIX) 75 MG tablet Take 1 tablet (75 mg total) by mouth daily.   ezetimibe (ZETIA) 10 MG tablet Take 1 tablet (10 mg total) by mouth daily.   famotidine (PEPCID) 10 MG tablet Take 10 mg by mouth daily as needed for heartburn or indigestion.   furosemide (LASIX) 20 MG tablet Take 20 mg by mouth daily.   guaiFENesin (MUCINEX) 600 MG 12 hr tablet Take 600 mg by mouth 2 (two) times daily as needed for to loosen phlegm.   levothyroxine (SYNTHROID, LEVOTHROID) 50 MCG tablet Take 50 mcg by mouth daily before breakfast.   nitroGLYCERIN (NITROSTAT) 0.4 MG SL tablet Place 0.4 mg under the tongue every 5 (five) minutes as needed for chest pain.     Allergies:   Atorvastatin, Codeine, Crestor [rosuvastatin], Pravastatin, Simvastatin, and Ticagrelor   Social History   Socioeconomic History   Marital status: Widowed    Spouse name: Not on file   Number of children: Not on file   Years of education: Not on file   Highest education level: Not on file  Occupational History   Not on file  Tobacco Use   Smoking status: Former    Types: Cigarettes    Quit date: 06/06/1967    Years since quitting: 53.7   Smokeless tobacco: Former  Brewing technologist Use: Never used  Substance and Sexual Activity   Alcohol use: No   Drug use: No   Sexual activity: Not Currently  Other Topics Concern   Not on file  Social History Narrative   Lives in [place].  Widowed.   Social Determinants of Health   Financial Resource Strain: Not on file  Food Insecurity: Not on file  Transportation Needs: Not on file  Physical Activity: Not on file  Stress: Not on file  Social Connections: Not on file     Family History: The patient's family history includes Dementia in her mother and sister;  Heart Problems in her father, paternal grandfather, and sister; Other in an other family member; Tuberculosis in her maternal grandmother. ROS:   Please see the history of present illness.    All other systems reviewed and are negative.  EKGs/Labs/Other Studies Reviewed:    The following studies were reviewed today:  EKG:  EKG ordered today and personally reviewed.  The ekg ordered today demonstrates sinus rhythm right bundle branch block  Recent Labs: 11/16/2020: Hemoglobin 12.0; Platelets 219 11/17/2020: B Natriuretic Peptide 132.9; Magnesium 2.1 12/23/2020: ALT 23; BUN 10; Creatinine, Ser 0.98; Potassium 4.8; Sodium 140; TSH 1.260  Recent Lipid Panel    Component Value Date/Time   CHOL 180 04/30/2015 0548   TRIG 249 (H) 04/30/2015 0548   HDL 27 (L) 04/30/2015 0548   CHOLHDL 6.7 04/30/2015 0548   VLDL 50 (H) 04/30/2015 0548   LDLCALC 103 (H) 04/30/2015 0548    Physical Exam:    VS:  BP 140/70 (BP Location: Right Arm, Patient Position: Sitting, Cuff Size: Normal)    Pulse 97    Ht 4\' 11"  (1.499 m)    Wt 178 lb (80.7 kg)    SpO2 (!) 65%    BMI 35.95 kg/m     Wt Readings from Last 3 Encounters:  02/15/21 178 lb (80.7 kg)  12/27/20 177 lb (80.3 kg)  12/23/20 177 lb 9.6 oz (80.6 kg)     GEN: Appears her age well nourished, well developed in no acute distress HEENT: Normal NECK: No JVD; No carotid bruits LYMPHATICS: No lymphadenopathy CARDIAC:  1 of 6 MS murmur RRR, no murmurs, rubs, gallops RESPIRATORY:  Clear to auscultation without rales, wheezing or rhonchi  ABDOMEN: Soft, non-tender, non-distended MUSCULOSKELETAL:  No edema; No deformity  SKIN: Warm and dry NEUROLOGIC:  Alert and oriented x 3 PSYCHIATRIC:  Normal affect    Signed, Shirlee More, MD  02/15/2021 1:40 PM    [facility]

## 2021-02-15 ENCOUNTER — Other Ambulatory Visit: Payer: Self-pay

## 2021-02-15 ENCOUNTER — Ambulatory Visit: Payer: Medicare Other | Admitting: Cardiology

## 2021-02-15 ENCOUNTER — Encounter: Payer: Self-pay | Admitting: Cardiology

## 2021-02-15 VITALS — BP 140/70 | HR 97 | Ht 59.0 in | Wt 178.0 lb

## 2021-02-15 DIAGNOSIS — I5032 Chronic diastolic (congestive) heart failure: Secondary | ICD-10-CM

## 2021-02-15 DIAGNOSIS — Z950 Presence of cardiac pacemaker: Secondary | ICD-10-CM | POA: Diagnosis not present

## 2021-02-15 DIAGNOSIS — Z7901 Long term (current) use of anticoagulants: Secondary | ICD-10-CM

## 2021-02-15 DIAGNOSIS — I480 Paroxysmal atrial fibrillation: Secondary | ICD-10-CM

## 2021-02-15 DIAGNOSIS — Z79899 Other long term (current) drug therapy: Secondary | ICD-10-CM | POA: Diagnosis not present

## 2021-02-15 DIAGNOSIS — I110 Hypertensive heart disease with heart failure: Secondary | ICD-10-CM | POA: Diagnosis not present

## 2021-02-15 NOTE — Patient Instructions (Signed)
Medication Instructions:  Your physician recommends that you continue on your current medications as directed. Please refer to the Current Medication list given to you today.  *If you need a refill on your cardiac medications before your next appointment, please call your pharmacy*   Lab Work: None If you have labs (blood work) drawn today and your tests are completely normal, you will receive your results only by: [place] (if you have MyChart) OR A paper copy in the mail If you have any lab test that is abnormal or we need to change your treatment, we will call you to review the results.   Testing/Procedures: None   Follow-Up: At [facility], you and your health needs are our priority.  As part of our continuing mission to provide you with exceptional heart care, we have created designated Provider Care Teams.  These Care Teams include your primary Cardiologist (physician) and Advanced Practice Providers (APPs -  Physician Assistants and Nurse Practitioners) who all work together to provide you with the care you need, when you need it.  We recommend signing up for the patient portal called "MyChart".  Sign up information is provided on this After Visit Summary.  MyChart is used to connect with patients for Virtual Visits (Telemedicine).  Patients are able to view lab/test results, encounter notes, upcoming appointments, etc.  Non-urgent messages can be sent to your provider as well.   To learn more about what you can do with MyChart, go to [email or web address].    Your next appointment:   4 month(s)  The format for your next appointment:   In Person  Provider:   Shirlee More, MD    Other Instructions

## 2021-02-15 NOTE — Addendum Note (Signed)
Addended by: Jerl Santos R on: 02/15/2021 04:26 PM   Modules accepted: Orders

## 2021-02-18 ENCOUNTER — Ambulatory Visit (INDEPENDENT_AMBULATORY_CARE_PROVIDER_SITE_OTHER): Payer: Medicare Other

## 2021-02-18 DIAGNOSIS — I495 Sick sinus syndrome: Secondary | ICD-10-CM | POA: Diagnosis not present

## 2021-02-18 LAB — CUP PACEART REMOTE DEVICE CHECK
Battery Remaining Longevity: 148 mo
Battery Voltage: 3.04 V
Brady Statistic AP VP Percent: 0.59 %
Brady Statistic AP VS Percent: 82.89 %
Brady Statistic AS VP Percent: 0.04 %
Brady Statistic AS VS Percent: 16.47 %
Brady Statistic RA Percent Paced: 83.71 %
Brady Statistic RV Percent Paced: 0.63 %
Date Time Interrogation Session: 20230203002707
Implantable Lead Implant Date: 20210806
Implantable Lead Implant Date: 20210806
Implantable Lead Location: 753859
Implantable Lead Location: 753860
Implantable Lead Model: 5076
Implantable Lead Model: 5076
Implantable Pulse Generator Implant Date: 20210806
Lead Channel Impedance Value: 323 Ohm
Lead Channel Impedance Value: 418 Ohm
Lead Channel Impedance Value: 437 Ohm
Lead Channel Impedance Value: 475 Ohm
Lead Channel Pacing Threshold Amplitude: 0.5 V
Lead Channel Pacing Threshold Amplitude: 0.75 V
Lead Channel Pacing Threshold Pulse Width: 0.4 ms
Lead Channel Pacing Threshold Pulse Width: 0.4 ms
Lead Channel Sensing Intrinsic Amplitude: 12.625 mV
Lead Channel Sensing Intrinsic Amplitude: 12.625 mV
Lead Channel Sensing Intrinsic Amplitude: 2.625 mV
Lead Channel Sensing Intrinsic Amplitude: 2.625 mV
Lead Channel Setting Pacing Amplitude: 1.5 V
Lead Channel Setting Pacing Amplitude: 2 V
Lead Channel Setting Pacing Pulse Width: 0.4 ms
Lead Channel Setting Sensing Sensitivity: 1.2 mV

## 2021-02-23 NOTE — Progress Notes (Signed)
Remote pacemaker transmission.   

## 2021-05-03 ENCOUNTER — Ambulatory Visit: Payer: Medicare Other | Admitting: Orthopaedic Surgery

## 2021-05-04 ENCOUNTER — Encounter: Payer: Self-pay | Admitting: Orthopaedic Surgery

## 2021-05-04 ENCOUNTER — Ambulatory Visit (INDEPENDENT_AMBULATORY_CARE_PROVIDER_SITE_OTHER): Payer: Medicare Other

## 2021-05-04 ENCOUNTER — Ambulatory Visit: Payer: Medicare Other | Admitting: Orthopaedic Surgery

## 2021-05-04 DIAGNOSIS — Z96642 Presence of left artificial hip joint: Secondary | ICD-10-CM | POA: Diagnosis not present

## 2021-05-04 NOTE — Progress Notes (Signed)
The patient is well-known to me.  She is over a year out from a left total hip replacement.  She does ambulate with a rolling walker at age 88.  She says that walker is helped her quite a bit but she has no concerns with her left operative hip and that her hip is doing very well with no pain.  She does have known arthritis in both her knees and her knees are still hurting.  Injections did not help enough to even repeat injections but she states that she is not interested in knee replacement surgery.  She says as long as she uses her rolling walker that her knees do well. ? ?Examination of her left operative hip shows that moves smoothly and fluidly with no complicating features or blocks or rotation.  There is no pain with motion of the left hip. ? ?An AP pelvis and lateral left hip shows a well-seated total hip arthroplasty on the left side with no complicating features.  The right hip joint space is well-maintained. ? ?At this point follow-up can be as needed.  All questions and concerns were answered and addressed.  If she does have any issues she knows she can reach out to [place]. ?

## 2021-05-09 ENCOUNTER — Telehealth: Payer: Self-pay | Admitting: Cardiology

## 2021-05-09 NOTE — Telephone Encounter (Signed)
Advised that Heather [name] had called but unable to advise why. Pt has been advised of her appointments. Pt verbalized understanding and had no additional questions. ?

## 2021-05-09 NOTE — Telephone Encounter (Signed)
Patient is calling stating she is returning a call from Friday in regards to her upcoming appointment. She states it was a nurse who called, but she could not remember the name. Unable to find documentation as to what it was in regards to. Patient is requesting a callback before 12:00 today due to having an appt. Please advise.  ?

## 2021-05-20 ENCOUNTER — Ambulatory Visit (INDEPENDENT_AMBULATORY_CARE_PROVIDER_SITE_OTHER): Payer: Medicare Other

## 2021-05-20 DIAGNOSIS — I495 Sick sinus syndrome: Secondary | ICD-10-CM

## 2021-05-20 LAB — CUP PACEART REMOTE DEVICE CHECK
Battery Remaining Longevity: 144 mo
Battery Voltage: 3.03 V
Brady Statistic AP VP Percent: 0.49 %
Brady Statistic AP VS Percent: 94.01 %
Brady Statistic AS VP Percent: 0.02 %
Brady Statistic AS VS Percent: 5.47 %
Brady Statistic RA Percent Paced: 94.56 %
Brady Statistic RV Percent Paced: 0.52 %
Date Time Interrogation Session: 20230504214921
Implantable Lead Implant Date: 20210806
Implantable Lead Implant Date: 20210806
Implantable Lead Location: 753859
Implantable Lead Location: 753860
Implantable Lead Model: 5076
Implantable Lead Model: 5076
Implantable Pulse Generator Implant Date: 20210806
Lead Channel Impedance Value: 323 Ohm
Lead Channel Impedance Value: 437 Ohm
Lead Channel Impedance Value: 475 Ohm
Lead Channel Impedance Value: 532 Ohm
Lead Channel Pacing Threshold Amplitude: 0.5 V
Lead Channel Pacing Threshold Amplitude: 0.75 V
Lead Channel Pacing Threshold Pulse Width: 0.4 ms
Lead Channel Pacing Threshold Pulse Width: 0.4 ms
Lead Channel Sensing Intrinsic Amplitude: 15.625 mV
Lead Channel Sensing Intrinsic Amplitude: 15.625 mV
Lead Channel Sensing Intrinsic Amplitude: 2.5 mV
Lead Channel Sensing Intrinsic Amplitude: 2.5 mV
Lead Channel Setting Pacing Amplitude: 1.5 V
Lead Channel Setting Pacing Amplitude: 2 V
Lead Channel Setting Pacing Pulse Width: 0.4 ms
Lead Channel Setting Sensing Sensitivity: 1.2 mV

## 2021-06-01 NOTE — Progress Notes (Signed)
Remote pacemaker transmission.   

## 2021-06-27 ENCOUNTER — Encounter: Payer: Self-pay | Admitting: Cardiology

## 2021-06-27 ENCOUNTER — Ambulatory Visit: Payer: Medicare Other | Admitting: Cardiology

## 2021-06-27 VITALS — BP 162/78 | HR 66 | Ht 59.0 in | Wt 178.0 lb

## 2021-06-27 DIAGNOSIS — Z9889 Other specified postprocedural states: Secondary | ICD-10-CM

## 2021-06-27 DIAGNOSIS — N184 Chronic kidney disease, stage 4 (severe): Secondary | ICD-10-CM

## 2021-06-27 DIAGNOSIS — I110 Hypertensive heart disease with heart failure: Secondary | ICD-10-CM

## 2021-06-27 DIAGNOSIS — Z7901 Long term (current) use of anticoagulants: Secondary | ICD-10-CM | POA: Diagnosis not present

## 2021-06-27 DIAGNOSIS — Z79899 Other long term (current) drug therapy: Secondary | ICD-10-CM | POA: Diagnosis not present

## 2021-06-27 DIAGNOSIS — E782 Mixed hyperlipidemia: Secondary | ICD-10-CM

## 2021-06-27 DIAGNOSIS — I480 Paroxysmal atrial fibrillation: Secondary | ICD-10-CM

## 2021-06-27 DIAGNOSIS — Z950 Presence of cardiac pacemaker: Secondary | ICD-10-CM

## 2021-06-27 DIAGNOSIS — I5032 Chronic diastolic (congestive) heart failure: Secondary | ICD-10-CM

## 2021-06-27 DIAGNOSIS — I25708 Atherosclerosis of coronary artery bypass graft(s), unspecified, with other forms of angina pectoris: Secondary | ICD-10-CM

## 2021-06-27 NOTE — Progress Notes (Signed)
Cardiology Office Note:    Date:  06/27/2021   ID:  Heather [name], DOB [birth year], MRN [id number]  PCP:  Heather Downing, MD  Cardiologist:  Heather More, MD    Referring MD: Heather [name], *    ASSESSMENT:    1. PAF (paroxysmal atrial fibrillation) ([place])   2. On amiodarone therapy   3. Chronic anticoagulation   4. Pacemaker   5. Hypertensive heart disease with chronic diastolic congestive heart failure ([place])   6. Coronary artery disease of bypass graft of native heart with stable angina pectoris ([place])   7. H/O mitral valve repair   8. Mixed hyperlipidemia   9. CKD (chronic kidney disease) stage 4, GFR 15-29 ml/min (HCC)    PLAN:    In order of problems listed above:  She continues to do well maintaining sinus rhythm on low-dose amiodarone I requested the recent labs done by her PCP if not performed she will need thyroid studies Stable pacemaker function followed in our device clinic Continue her anticoagulation plus clopidogrel I have asked her to start to check trend home blood pressures for now continue her loop diuretic and calcium channel blocker.  She tells me she developed stage IV CKD Recheck her echocardiogram in October for mitral valve dysfunction Continue Zetia statin intolerant   Next appointment: 9 months   Medication Adjustments/Labs and Tests Ordered: Current medicines are reviewed at length with the patient today.  Concerns regarding medicines are outlined above.  No orders of the defined types were placed in this encounter.  No orders of the defined types were placed in this encounter.   Chief complaint follow-up atrial fibrillation on amiodarone also CAD mitral valve disease hypertension and hyper lipidemia   History of Present Illness:    Heather [name] is a 88 y.o. female with a hx of CABG and mitral valve repair 2003 inferior ST segment elevation MI in 2015 with PCI and drug-eluting stent to saphenous vein graft posterolateral  artery paroxysmal atrial fibrillation on amiodarone bradycardia and permanent pacemaker hyperlipidemia with statin intolerance.  She was admitted to [facility] in November 04, 2020 for heart failure.  Her ejection fraction WAS 60 TO 65% WITH A MEAN GRADIENT ACROSS THE VALVE AND 6.5 MMHG. Last seen 02/15/2021.  She tells me she had full labs done 3 weeks ago in her PCPs office and was told she has stage IV CKD.  I will request a copy Her weight is stable at home she takes an extra dose of diuretic perhaps once a week when she notices edema she still does activity short of heavy housework and is not having shortness of breath orthopnea chest pain palpitation or syncope EKG today shows sinus rhythm first-degree AV block right bundle branch block Her last pacemaker device checks 05/19/2021 showed normal device and lead function projected battery life 144 months ventricularly paced less than 1% of the time and atrially paced 91% Compliance with diet, lifestyle and medications: Yes Past Medical History:  Diagnosis Date   Anxiety attack 01/03/2016   Aphasia 01/03/2016   Arthritis    CAD (coronary artery disease) of artery bypass graft, s/p PCI stent to VG->RCA 04/30/15 07/14/2013   Catheterization August/2003 showing severe three-vessel disease and moderate mitral regurgitation 08/28/2001 CABG with internal mammary graft to LAD, vein graft to circumflex, and vein graft to posterolateral branch and mitral valve repair by Dr. Roxy Manns Inferior infarction 07/12/13 with occlusion of the vein graft to right coronary artery, stenting with a  3.5 x 18 mm resolute stent. Patent internal ma   CAD (coronary artery disease), native coronary artery 07/14/2013   Catheterization August/2003 showing severe three-vessel disease and moderate mitral regurgitation 08/28/2001 CABG with internal mammary graft to LAD, vein graft to circumflex, and vein graft to posterolateral branch and mitral valve repair by Dr. Roxy Manns Inferior  infarction 07/12/13 with occlusion of the vein graft to right coronary artery, stenting with a 3.518 mm resolute stents. Patent internal mammary graft with competitive flow in the distal vessel, occluded right coronary artery, occluded circumflex, patent saphenous vein graft to circumflex with mild/moderate disease proximally, minimal LV damage    Chest pain 08/20/2019   Chronic diastolic CHF (congestive heart failure) ([place])    a. EF 60% by LV gram 01/2009.   Coronary artery disease    a. s/p MI in 1980;  b. 2003 CABG x 3 (LIMA->LAD, VG->OM, VG->RPL);  c. 01/2009 Cath: 3/3 patent grafts, native 3VD, EF 60%.   Diverticulitis    Dyspnea 01/03/2016   Elevated troponin    GERD (gastroesophageal reflux disease)    H/O mitral valve repair    a. 2003   History of kidney stones    Hyperlipidemia 07/14/2013   Intolerance to multiple statins including Crestor, pravastatin, and Lipitor    Hypertension    Hypertension, uncontrolled 07/19/2014   Hypertensive heart disease 07/14/2013   Hypothyroidism    NSTEMI (non-ST elevated myocardial infarction) ([place])    Other hyperlipidemia    PAF (paroxysmal atrial fibrillation) ([place])    Pneumonia    Presence of permanent cardiac pacemaker 08/22/2019   S/P partial hysterectomy 1977   Sinus node dysfunction [facility])     Past Surgical History:  Procedure Laterality Date   ABDOMINAL HYSTERECTOMY  1977   APPENDECTOMY  1950   BLADDER SUSPENSION     tac   CARDIAC CATHETERIZATION  03,05,11   CARDIAC CATHETERIZATION N/A 04/30/2015   Procedure: Left Heart Cath and Cors/Grafts Angiography;  Surgeon: Belva Crome, MD;  Location: [facility];  Service: Cardiovascular;  Laterality: N/A;   CARDIAC CATHETERIZATION N/A 04/30/2015   Procedure: Coronary Stent Intervention;  Surgeon: Belva Crome, MD;  Location: [place] [facility];  Service: Cardiovascular;  Laterality: N/A;  Proximal and Distal SVG to the PLA   CARDIAC CATHETERIZATION N/A 01/06/2016   Procedure: Left Heart  Cath and Cors/Grafts Angiography;  Surgeon: Jettie Booze, MD;  Location: [facility];  Service: Cardiovascular;  Laterality: N/A;   CARPAL TUNNEL RELEASE     right and left   CHOLECYSTECTOMY     CORONARY ARTERY BYPASS GRAFT  2003   KNEE ARTHROSCOPY     left   LEFT HEART CATHETERIZATION WITH CORONARY ANGIOGRAM N/A 07/12/2013   Procedure: LEFT HEART CATHETERIZATION WITH CORONARY ANGIOGRAM;  Surgeon: Jettie Booze, MD;  Location: [facility];  Service: Cardiovascular;  Laterality: N/A;   LEFT TOTAL HIP ARTHROPLASTY ANTERIOR APPROACH (Left Hip)  03/16/2020   MITRAL VALVE REPAIR  2003   with cabg   PACEMAKER IMPLANT N/A 08/22/2019   Procedure: PACEMAKER IMPLANT;  Surgeon: Constance Haw, MD;  Location: [place] [facility];  Service: Cardiovascular;  Laterality: N/A;   THYROIDECTOMY  1968   THYROIDECTOMY, PARTIAL     left   TOTAL HIP ARTHROPLASTY Left 03/16/2020   Procedure: LEFT TOTAL HIP ARTHROPLASTY ANTERIOR APPROACH;  Surgeon: Mcarthur Rossetti, MD;  Location: [place];  Service: Orthopedics;  Laterality: Left;    Current Medications: Current  Meds  Medication Sig   acetaminophen (TYLENOL) 325 MG tablet Take 1-2 tablets (325-650 mg total) by mouth every 6 (six) hours as needed for mild pain (pain score 1-3 or temp > 100.5).   albuterol (VENTOLIN HFA) 108 (90 Base) MCG/ACT inhaler Inhale 1-2 puffs into the lungs 4 (four) times daily as needed for shortness of breath or wheezing.   amiodarone (PACERONE) 200 MG tablet Take 1 tablet (200 mg total) by mouth daily.   amLODipine (NORVASC) 2.5 MG tablet Take 2.5 mg by mouth daily.   apixaban (ELIQUIS) 5 MG TABS tablet Take 1 tablet (5 mg total) by mouth 2 (two) times daily.   cholecalciferol (VITAMIN D3) 25 MCG (1000 UNIT) tablet Take 1,000 Units by mouth daily.   clopidogrel (PLAVIX) 75 MG tablet Take 1 tablet (75 mg total) by mouth daily.   ezetimibe (ZETIA) 10 MG tablet Take 1 tablet (10 mg total) by mouth daily.    famotidine (PEPCID) 10 MG tablet Take 10 mg by mouth daily as needed for heartburn or indigestion.   furosemide (LASIX) 20 MG tablet Take 20 mg by mouth daily.   guaiFENesin (MUCINEX) 600 MG 12 hr tablet Take 600 mg by mouth 2 (two) times daily as needed for to loosen phlegm.   levothyroxine (SYNTHROID, LEVOTHROID) 50 MCG tablet Take 50 mcg by mouth daily before breakfast.   nitroGLYCERIN (NITROSTAT) 0.4 MG SL tablet Place 0.4 mg under the tongue every 5 (five) minutes as needed for chest pain.     Allergies:   Atorvastatin, Codeine, Crestor [rosuvastatin], Pravastatin, Simvastatin, and Ticagrelor   Social History   Socioeconomic History   Marital status: Widowed    Spouse name: Not on file   Number of children: Not on file   Years of education: Not on file   Highest education level: Not on file  Occupational History   Not on file  Tobacco Use   Smoking status: Former    Types: Cigarettes    Quit date: 06/06/1967    Years since quitting: 54.0    Passive exposure: Past   Smokeless tobacco: Former  Scientific laboratory technician Use: Never used  Substance and Sexual Activity   Alcohol use: No   Drug use: No   Sexual activity: Not Currently  Other Topics Concern   Not on file  Social History Narrative   Lives in [place].  Widowed.   Social Determinants of Health   Financial Resource Strain: Not on file  Food Insecurity: Not on file  Transportation Needs: Not on file  Physical Activity: Not on file  Stress: Not on file  Social Connections: Not on file     Family History: The patient's family history includes Dementia in her mother and sister; Heart Problems in her father, paternal grandfather, and sister; Other in an other family member; Tuberculosis in her maternal grandmother. ROS:   Please see the history of present illness.    All other systems reviewed and are negative.  EKGs/Labs/Other Studies Reviewed:    The following studies were reviewed today:  EKG:  EKG ordered  today and personally reviewed.  The ekg ordered today demonstrates sinus rhythm first-degree AV block right bundle branch block and pacemaker activity  Recent Labs: 11/16/2020: Hemoglobin 12.0; Platelets 219 11/17/2020: B Natriuretic Peptide 132.9; Magnesium 2.1 12/23/2020: ALT 23; BUN 10; Creatinine, Ser 0.98; Potassium 4.8; Sodium 140; TSH 1.260  Recent Lipid Panel    Component Value Date/Time   CHOL 180 04/30/2015 0548   TRIG  249 (H) 04/30/2015 0548   HDL 27 (L) 04/30/2015 0548   CHOLHDL 6.7 04/30/2015 0548   VLDL 50 (H) 04/30/2015 0548   LDLCALC 103 (H) 04/30/2015 0548    Physical Exam:    VS:  BP (!) 162/78 (BP Location: Right Arm, Patient Position: Sitting)   Pulse 66   Ht 4\' 11"  (1.499 m)   Wt 178 lb (80.7 kg)   SpO2 98%   BMI 35.95 kg/m     Wt Readings from Last 3 Encounters:  06/27/21 178 lb (80.7 kg)  02/15/21 178 lb (80.7 kg)  12/27/20 177 lb (80.3 kg)     GEN: Looks her age well nourished, well developed in no acute distress HEENT: Normal NECK: No JVD; No carotid bruits LYMPHATICS: No lymphadenopathy CARDIAC: Could not auscultate a murmur mitral stenosis or regurgitation RRR, no murmurs, rubs, gallops RESPIRATORY:  Clear to auscultation without rales, wheezing or rhonchi  ABDOMEN: Soft, non-tender, non-distended MUSCULOSKELETAL:  No edema; No deformity  SKIN: Warm and dry NEUROLOGIC:  Alert and oriented x 3 PSYCHIATRIC:  Normal affect    Signed, Heather More, MD  06/27/2021 10:47 AM    [place]

## 2021-06-27 NOTE — Patient Instructions (Signed)
Medication Instructions:  Your physician recommends that you continue on your current medications as directed. Please refer to the Current Medication list given to you today.  *If you need a refill on your cardiac medications before your next appointment, please call your pharmacy*   Lab Work: None If you have labs (blood work) drawn today and your tests are completely normal, you will receive your results only by: [place] (if you have MyChart) OR A paper copy in the mail If you have any lab test that is abnormal or we need to change your treatment, we will call you to review the results.   Testing/Procedures: Your physician has requested that you have an echocardiogram in October. Echocardiography is a painless test that uses sound waves to create images of your heart. It provides your doctor with information about the size and shape of your heart and how well your heart's chambers and valves are working. This procedure takes approximately one hour. There are no restrictions for this procedure.    Follow-Up: At [facility], you and your health needs are our priority.  As part of our continuing mission to provide you with exceptional heart care, we have created designated Provider Care Teams.  These Care Teams include your primary Cardiologist (physician) and Advanced Practice Providers (APPs -  Physician Assistants and Nurse Practitioners) who all work together to provide you with the care you need, when you need it.  We recommend signing up for the patient portal called "MyChart".  Sign up information is provided on this After Visit Summary.  MyChart is used to connect with patients for Virtual Visits (Telemedicine).  Patients are able to view lab/test results, encounter notes, upcoming appointments, etc.  Non-urgent messages can be sent to your provider as well.   To learn more about what you can do with MyChart, go to [email or web address].    Your next appointment:   6  month(s)  The format for your next appointment:   In Person  Provider:   Shirlee More, MD    Other Instructions Get labs done 3 weeks ago with Dr. Arelia Sneddon  Check home BP. Get [employer] arm blood pressure device and record daily  Important Information About Sugar

## 2021-06-27 NOTE — Addendum Note (Signed)
Addended by: Edwyna Shell I on: 06/27/2021 11:00 AM   Modules accepted: Orders

## 2021-07-25 ENCOUNTER — Ambulatory Visit
Admission: RE | Admit: 2021-07-25 | Discharge: 2021-07-25 | Disposition: A | Discharge status: Home / Self Care | Payer: Medicare Other | Source: Ambulatory Visit | Attending: Family Medicine | Admitting: Family Medicine

## 2021-07-25 ENCOUNTER — Other Ambulatory Visit: Payer: Self-pay | Admitting: Family Medicine

## 2021-07-25 DIAGNOSIS — M79604 Pain in right leg: Secondary | ICD-10-CM

## 2021-08-01 ENCOUNTER — Ambulatory Visit: Payer: Medicare Other | Admitting: Orthopaedic Surgery

## 2021-08-01 ENCOUNTER — Encounter: Payer: Self-pay | Admitting: Orthopaedic Surgery

## 2021-08-01 DIAGNOSIS — M25551 Pain in right hip: Secondary | ICD-10-CM | POA: Diagnosis not present

## 2021-08-01 DIAGNOSIS — M5441 Lumbago with sciatica, right side: Secondary | ICD-10-CM | POA: Diagnosis not present

## 2021-08-01 MED ORDER — BACLOFEN 10 MG PO TABS: 10.0000 mg | ORAL_TABLET | Freq: Three times a day (TID) | ORAL | 0 refills | Status: DC | PRN

## 2021-08-01 MED ORDER — PREDNISONE 50 MG PO TABS: ORAL_TABLET | ORAL | 0 refills | Status: DC
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
## 2021-08-08 ENCOUNTER — Encounter: Payer: Self-pay | Admitting: Physician Assistant

## 2021-08-08 ENCOUNTER — Ambulatory Visit: Payer: Medicare Other | Admitting: Physician Assistant

## 2021-08-08 ENCOUNTER — Ambulatory Visit (INDEPENDENT_AMBULATORY_CARE_PROVIDER_SITE_OTHER): Payer: Medicare Other

## 2021-08-08 DIAGNOSIS — M5441 Lumbago with sciatica, right side: Secondary | ICD-10-CM

## 2021-08-08 NOTE — Progress Notes (Signed)
Office Visit Note   Patient: Heather [name]           Date of Birth: 03-Dec-1930           MRN: [id number] Visit Date: 08/08/2021              Requested by: Leonard Downing, MD [address] [place],  [place] [address] PCP: Leonard Downing, MD   Assessment & Plan: Visit Diagnoses:  1. Acute right-sided low back pain with right-sided sciatica     Plan: Discussed getting MRI whether she states she is not interested in MRI at this point time.  Also discussed physical therapy she does not wish to go to therapy.  Therefore she will continue to just monitor her symptoms if they become worse to the point that she wants to obtain an MRI for possible ESI or go to formal therapy she will let our office know.  Questions were encouraged and answered.  Follow-Up Instructions: Return if symptoms worsen or fail to improve.   Orders:  Orders Placed This Encounter  Procedures   XR Lumbar Spine 2-3 Views   No orders of the defined types were placed in this encounter.     Procedures: No procedures performed   Clinical Data: No additional findings.   Subjective: Chief Complaint  Patient presents with   Right Hip - Pain    HPI Heather [name] returns today stating that she is still having right lateral hip pain burning sensation.  Denies any numbness tingling.  Pain was going down the leg to the ankle but this is gotten better since being on the Medrol Dosepak.  She also has been given an injection by Dr. Arelia Sneddon sounds like a trochanteric injection about 2 weeks ago that really did not help.  Ranks her pain to be 6-8 out of 10 pain most the time at worst 10 out of 10 pain. Review of Systems See HPI otherwise negative or noncontributory.  Objective: Vital Signs: There were no vitals taken for this visit.  Physical Exam General: Well-developed well-nourished female no acute distress ambulates with a rollator. Ortho Exam Lower extremities 5 out of 5 strength throughout the  lower extremities except for right hip flexion which is 4 out of 5.  Tight hamstrings bilaterally negative straight leg raise bilaterally.  Tenderness over the right trochanteric region. Specialty Comments:  No specialty comments available.  Imaging: XR Lumbar Spine 2-3 Views  Result Date: 08/08/2021 Lumbar spine 2 views: Significant scoliosis.  Diffuse degenerative changes throughout the lumbar spine with loss of disc space throughout.  Grade 1/grade 2 anterior spinal listhesis L4 on 5.  Lower lumbar facet arthritic changes.  Arthrosclerosis aorta.    PMFS History: Patient Active Problem List   Diagnosis Date Noted   Lung nodule 12/27/2020   Elevated brain natriuretic peptide (BNP) level 11/13/2020   Prolonged QT interval 11/13/2020   Atrial fibrillation, chronic ([place]) 11/13/2020   SOB (shortness of breath) 11/13/2020   Shortness of breath 11/12/2020   Pneumonia    History of kidney stones    Diverticulitis    Arthritis of left hip 03/16/2020   Status post total replacement of left hip 03/16/2020   Hypertension    GERD (gastroesophageal reflux disease)    Coronary artery disease    Chronic diastolic CHF (congestive heart failure) ([place])    Arthritis    Pacemaker 09/15/2019   Presence of permanent cardiac pacemaker 08/22/2019   Sinus node dysfunction ([place])  Chest pain 08/20/2019   Elevated troponin    Dyspnea 01/03/2016   Anxiety attack 01/03/2016   PAF (paroxysmal atrial fibrillation) ([place]) 01/03/2016   Aphasia    Other hyperlipidemia    NSTEMI (non-ST elevated myocardial infarction) ([place]) 04/30/2015   Hypertension, uncontrolled 07/19/2014   H/O mitral valve repair    CAD (coronary artery disease) of artery bypass graft, s/p PCI stent to VG->RCA 04/30/15 07/14/2013   Obesity (BMI 30-39.9) 07/14/2013   Hyperlipidemia 07/14/2013   Hypothyroidism 07/14/2013   Hypertensive heart disease 07/14/2013   CAD (coronary artery disease), native coronary artery 07/14/2013   S/P  partial hysterectomy 1977   Past Medical History:  Diagnosis Date   Anxiety attack 01/03/2016   Aphasia 01/03/2016   Arthritis    CAD (coronary artery disease) of artery bypass graft, s/p PCI stent to VG->RCA 04/30/15 07/14/2013   Catheterization August/2003 showing severe three-vessel disease and moderate mitral regurgitation 08/28/2001 CABG with internal mammary graft to LAD, vein graft to circumflex, and vein graft to posterolateral branch and mitral valve repair by Dr. 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 Patent internal ma   CAD (coronary artery disease), native coronary artery 07/14/2013   Catheterization August/2003 showing severe three-vessel disease and moderate mitral regurgitation 08/28/2001 CABG with internal mammary graft to LAD, vein graft to circumflex, and vein graft to posterolateral branch and mitral valve repair by Dr. 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 Patent internal mammary graft with competitive flow in the distal vessel, occluded right coronary artery, occluded circumflex, patent saphenous vein graft to circumflex with mild/moderate disease proximally, minimal LV damage    Chest pain 08/20/2019   Chronic diastolic CHF (congestive heart failure) ([place])    a. EF 60% by LV gram 01/2009.   Coronary artery disease    a. s/p MI in 1980;  b. 2003 CABG x 3 (LIMA->LAD, VG->OM, VG->RPL);  c. 01/2009 Cath: 3/3 patent grafts, native 3VD, EF 60%.   Diverticulitis    Dyspnea 01/03/2016   Elevated troponin    GERD (gastroesophageal reflux disease)    H/O mitral valve repair    a. 2003   History of kidney stones    Hyperlipidemia 07/14/2013   Intolerance to multiple statins including Crestor, pravastatin, and Lipitor    Hypertension    Hypertension, uncontrolled 07/19/2014   Hypertensive heart  disease 07/14/2013   Hypothyroidism    NSTEMI (non-ST elevated myocardial infarction) ([place])    Other hyperlipidemia    PAF (paroxysmal atrial fibrillation) ([place])    Pneumonia    Presence of permanent cardiac pacemaker 08/22/2019   S/P partial hysterectomy 1977   Sinus node dysfunction ([place])     Family History  Problem Relation Age of Onset   Dementia Mother    Heart Problems Father    Dementia Sister    Heart Problems Sister    Other Other        no premature CAD.   Tuberculosis Maternal Grandmother    Heart Problems Paternal Grandfather     Past Surgical History:  Procedure Laterality Date   ABDOMINAL HYSTERECTOMY  1977   APPENDECTOMY  1950   BLADDER SUSPENSION     tac   CARDIAC CATHETERIZATION  03,05,11   CARDIAC CATHETERIZATION N/A 04/30/2015   Procedure: Left Heart Cath and Cors/Grafts Angiography;  Surgeon: Lynnell Dike  Tamala Julian, MD;  Location: [place] [facility];  Service: Cardiovascular;  Laterality: N/A;   CARDIAC CATHETERIZATION N/A 04/30/2015   Procedure: Coronary Stent Intervention;  Surgeon: Belva Crome, MD;  Location: [place] [facility];  Service: Cardiovascular;  Laterality: N/A;  Proximal and Distal SVG to the PLA   CARDIAC CATHETERIZATION N/A 01/06/2016   Procedure: Left Heart Cath and Cors/Grafts Angiography;  Surgeon: Jettie Booze, MD;  Location: [place] [facility];  Service: Cardiovascular;  Laterality: N/A;   CARPAL TUNNEL RELEASE     right and left   CHOLECYSTECTOMY     CORONARY ARTERY BYPASS GRAFT  2003   KNEE ARTHROSCOPY     left   LEFT HEART CATHETERIZATION WITH CORONARY ANGIOGRAM N/A 07/12/2013   Procedure: LEFT HEART CATHETERIZATION WITH CORONARY ANGIOGRAM;  Surgeon: Jettie Booze, MD;  Location: [facility];  Service: Cardiovascular;  Laterality: N/A;   LEFT TOTAL HIP ARTHROPLASTY ANTERIOR APPROACH (Left Hip)  03/16/2020   MITRAL VALVE REPAIR  2003   with cabg   PACEMAKER IMPLANT N/A 08/22/2019   Procedure: PACEMAKER IMPLANT;  Surgeon:  Constance Haw, MD;  Location: [place] [facility];  Service: Cardiovascular;  Laterality: N/A;   THYROIDECTOMY  1968   THYROIDECTOMY, PARTIAL     left   TOTAL HIP ARTHROPLASTY Left 03/16/2020   Procedure: LEFT TOTAL HIP ARTHROPLASTY ANTERIOR APPROACH;  Surgeon: Mcarthur Rossetti, MD;  Location: [place];  Service: Orthopedics;  Laterality: Left;   Social History   Occupational History   Not on file  Tobacco Use   Smoking status: Former    Types: Cigarettes    Quit date: 06/06/1967    Years since quitting: 54.2    Passive exposure: Past   Smokeless tobacco: Former  Scientific laboratory technician Use: Never used  Substance and Sexual Activity   Alcohol use: No   Drug use: No   Sexual activity: Not Currently

## 2021-08-19 ENCOUNTER — Ambulatory Visit (INDEPENDENT_AMBULATORY_CARE_PROVIDER_SITE_OTHER): Payer: Medicare Other

## 2021-08-19 DIAGNOSIS — I495 Sick sinus syndrome: Secondary | ICD-10-CM

## 2021-08-19 LAB — CUP PACEART REMOTE DEVICE CHECK
Battery Remaining Longevity: 141 mo
Battery Voltage: 3.03 V
Brady Statistic AP VP Percent: 0.59 %
Brady Statistic AP VS Percent: 86.16 %
Brady Statistic AS VP Percent: 0.03 %
Brady Statistic AS VS Percent: 13.22 %
Brady Statistic RA Percent Paced: 87.09 %
Brady Statistic RV Percent Paced: 0.62 %
Date Time Interrogation Session: 20230804013948
Implantable Lead Implant Date: 20210806
Implantable Lead Implant Date: 20210806
Implantable Lead Location: 753859
Implantable Lead Location: 753860
Implantable Lead Model: 5076
Implantable Lead Model: 5076
Implantable Pulse Generator Implant Date: 20210806
Lead Channel Impedance Value: 323 Ohm
Lead Channel Impedance Value: 418 Ohm
Lead Channel Impedance Value: 418 Ohm
Lead Channel Impedance Value: 475 Ohm
Lead Channel Pacing Threshold Amplitude: 0.5 V
Lead Channel Pacing Threshold Amplitude: 0.875 V
Lead Channel Pacing Threshold Pulse Width: 0.4 ms
Lead Channel Pacing Threshold Pulse Width: 0.4 ms
Lead Channel Sensing Intrinsic Amplitude: 11.875 mV
Lead Channel Sensing Intrinsic Amplitude: 11.875 mV
Lead Channel Sensing Intrinsic Amplitude: 2.75 mV
Lead Channel Sensing Intrinsic Amplitude: 2.75 mV
Lead Channel Setting Pacing Amplitude: 1.5 V
Lead Channel Setting Pacing Amplitude: 2 V
Lead Channel Setting Pacing Pulse Width: 0.4 ms
Lead Channel Setting Sensing Sensitivity: 1.2 mV

## 2021-09-08 NOTE — Progress Notes (Signed)
Remote pacemaker transmission.   

## 2021-11-16 ENCOUNTER — Ambulatory Visit: Payer: Medicare Other | Attending: Cardiology

## 2021-11-16 DIAGNOSIS — I480 Paroxysmal atrial fibrillation: Secondary | ICD-10-CM | POA: Diagnosis not present

## 2021-11-16 DIAGNOSIS — Z79899 Other long term (current) drug therapy: Secondary | ICD-10-CM

## 2021-11-16 DIAGNOSIS — Z950 Presence of cardiac pacemaker: Secondary | ICD-10-CM

## 2021-11-16 DIAGNOSIS — Z7901 Long term (current) use of anticoagulants: Secondary | ICD-10-CM | POA: Diagnosis not present

## 2021-11-16 DIAGNOSIS — I110 Hypertensive heart disease with heart failure: Secondary | ICD-10-CM

## 2021-11-16 DIAGNOSIS — N184 Chronic kidney disease, stage 4 (severe): Secondary | ICD-10-CM

## 2021-11-16 DIAGNOSIS — E782 Mixed hyperlipidemia: Secondary | ICD-10-CM

## 2021-11-16 DIAGNOSIS — I25708 Atherosclerosis of coronary artery bypass graft(s), unspecified, with other forms of angina pectoris: Secondary | ICD-10-CM

## 2021-11-16 DIAGNOSIS — Z9889 Other specified postprocedural states: Secondary | ICD-10-CM

## 2021-11-16 DIAGNOSIS — I5032 Chronic diastolic (congestive) heart failure: Secondary | ICD-10-CM

## 2021-11-16 LAB — ECHOCARDIOGRAM COMPLETE
Area-P 1/2: 3.11 cm2
MV VTI: 1.18 cm2
S' Lateral: 4.2 cm

## 2021-11-18 ENCOUNTER — Ambulatory Visit (INDEPENDENT_AMBULATORY_CARE_PROVIDER_SITE_OTHER): Payer: Medicare Other

## 2021-11-18 DIAGNOSIS — I495 Sick sinus syndrome: Secondary | ICD-10-CM

## 2021-11-18 LAB — CUP PACEART REMOTE DEVICE CHECK
Battery Remaining Longevity: 138 mo
Battery Voltage: 3.03 V
Brady Statistic AP VP Percent: 0.34 %
Brady Statistic AP VS Percent: 79.5 %
Brady Statistic AS VP Percent: 0.03 %
Brady Statistic AS VS Percent: 20.13 %
Brady Statistic RA Percent Paced: 80.19 %
Brady Statistic RV Percent Paced: 0.37 %
Date Time Interrogation Session: 20231102214302
Implantable Lead Connection Status: 753985
Implantable Lead Connection Status: 753985
Implantable Lead Implant Date: 20210806
Implantable Lead Implant Date: 20210806
Implantable Lead Location: 753859
Implantable Lead Location: 753860
Implantable Lead Model: 5076
Implantable Lead Model: 5076
Implantable Pulse Generator Implant Date: 20210806
Lead Channel Impedance Value: 323 Ohm
Lead Channel Impedance Value: 418 Ohm
Lead Channel Impedance Value: 475 Ohm
Lead Channel Impedance Value: 532 Ohm
Lead Channel Pacing Threshold Amplitude: 0.375 V
Lead Channel Pacing Threshold Amplitude: 0.875 V
Lead Channel Pacing Threshold Pulse Width: 0.4 ms
Lead Channel Pacing Threshold Pulse Width: 0.4 ms
Lead Channel Sensing Intrinsic Amplitude: 13.875 mV
Lead Channel Sensing Intrinsic Amplitude: 13.875 mV
Lead Channel Sensing Intrinsic Amplitude: 2.625 mV
Lead Channel Sensing Intrinsic Amplitude: 2.625 mV
Lead Channel Setting Pacing Amplitude: 1.5 V
Lead Channel Setting Pacing Amplitude: 2 V
Lead Channel Setting Pacing Pulse Width: 0.4 ms
Lead Channel Setting Sensing Sensitivity: 1.2 mV
Zone Setting Status: 755011

## 2021-11-28 NOTE — Progress Notes (Signed)
Remote pacemaker transmission.   

## 2021-12-21 NOTE — Progress Notes (Signed)
Electrophysiology Office Note Date: 12/21/2021  ID:  Heather [name], DOB Jul 23, 1930, MRN [id number]  PCP: Leonard Downing, MD Primary Cardiologist: Shirlee More, MD Electrophysiologist: Vickie Epley, MD   CC: Pacemaker follow-up  Heather [name] is a 88 y.o. female seen today for Vickie Epley, MD for routine electrophysiology followup. Since last being seen in our clinic the patient reports doing well overall. She has been taking lasix 20 mg BID for the past 3-4 weeks, and fluid is well controlled. Drinking at least 48 oz of water daily.  she denies chest pain, palpitations, dyspnea, PND, orthopnea, nausea, vomiting, dizziness, syncope, weight gain, or early satiety.   Device History: Medtronic Dual Chamber PPM implanted 08/2019 for SSS/Tachy/Brady  Past Medical History:  Diagnosis Date   Anxiety attack 01/03/2016   Aphasia 01/03/2016   Arthritis    CAD (coronary artery disease) of artery bypass graft, s/p PCI stent to VG->RCA 04/30/15 07/14/2013   Catheterization August/2003 showing severe three-vessel disease and moderate mitral regurgitation 08/28/2001 CABG with internal mammary graft to LAD, vein graft to circumflex, and vein graft to posterolateral branch and mitral valve repair by Dr. 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 Patent internal ma   CAD (coronary artery disease), native coronary artery 07/14/2013   Catheterization August/2003 showing severe three-vessel disease and moderate mitral regurgitation 08/28/2001 CABG with internal mammary graft to LAD, vein graft to circumflex, and vein graft to posterolateral branch and mitral valve repair by Dr. 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 Patent internal mammary graft with competitive flow in the distal vessel, occluded right  coronary artery, occluded circumflex, patent saphenous vein graft to circumflex with mild/moderate disease proximally, minimal LV damage    Chest pain 08/20/2019   Chronic diastolic CHF (congestive heart failure) ([place])    a. EF 60% by LV gram 01/2009.   Coronary artery disease    a. s/p MI in 1980;  b. 2003 CABG x 3 (LIMA->LAD, VG->OM, VG->RPL);  c. 01/2009 Cath: 3/3 patent grafts, native 3VD, EF 60%.   Diverticulitis    Dyspnea 01/03/2016   Elevated troponin    GERD (gastroesophageal reflux disease)    H/O mitral valve repair    a. 2003   History of kidney stones    Hyperlipidemia 07/14/2013   Intolerance to multiple statins including Crestor, pravastatin, and Lipitor    Hypertension    Hypertension, uncontrolled 07/19/2014   Hypertensive heart disease 07/14/2013   Hypothyroidism    NSTEMI (non-ST elevated myocardial infarction) ([place])    Other hyperlipidemia    PAF (paroxysmal atrial fibrillation) ([place])    Pneumonia    Presence of permanent cardiac pacemaker 08/22/2019   S/P partial hysterectomy 1977   Sinus node dysfunction [facility])    Past Surgical History:  Procedure Laterality Date   ABDOMINAL HYSTERECTOMY  1977   APPENDECTOMY  1950   BLADDER SUSPENSION     tac   CARDIAC CATHETERIZATION  03,05,11   CARDIAC CATHETERIZATION N/A 04/30/2015   Procedure: Left Heart Cath and Cors/Grafts Angiography;  Surgeon: Belva Crome, MD;  Location: [place] [facility];  Service: Cardiovascular;  Laterality: N/A;   CARDIAC CATHETERIZATION N/A 04/30/2015   Procedure: Coronary Stent Intervention;  Surgeon: Belva Crome, MD;  Location: [place] [facility];  Service: Cardiovascular;  Laterality: N/A;  Proximal and Distal SVG to the PLA   CARDIAC CATHETERIZATION N/A 01/06/2016   Procedure: Left Heart Cath and Cors/Grafts Angiography;  Surgeon: Jettie Booze, MD;  Location: [place] [facility];  Service: Cardiovascular;  Laterality: N/A;   CARPAL TUNNEL RELEASE     right and left   CHOLECYSTECTOMY      CORONARY ARTERY BYPASS GRAFT  2003   KNEE ARTHROSCOPY     left   LEFT HEART CATHETERIZATION WITH CORONARY ANGIOGRAM N/A 07/12/2013   Procedure: LEFT HEART CATHETERIZATION WITH CORONARY ANGIOGRAM;  Surgeon: Jettie Booze, MD;  Location: [facility];  Service: Cardiovascular;  Laterality: N/A;   LEFT TOTAL HIP ARTHROPLASTY ANTERIOR APPROACH (Left Hip)  03/16/2020   MITRAL VALVE REPAIR  2003   with cabg   PACEMAKER IMPLANT N/A 08/22/2019   Procedure: PACEMAKER IMPLANT;  Surgeon: Constance Haw, MD;  Location: [place] [facility];  Service: Cardiovascular;  Laterality: N/A;   THYROIDECTOMY  1968   THYROIDECTOMY, PARTIAL     left   TOTAL HIP ARTHROPLASTY Left 03/16/2020   Procedure: LEFT TOTAL HIP ARTHROPLASTY ANTERIOR APPROACH;  Surgeon: Mcarthur Rossetti, MD;  Location: [place];  Service: Orthopedics;  Laterality: Left;    Current Outpatient Medications  Medication Sig Dispense Refill   acetaminophen (TYLENOL) 325 MG tablet Take 1-2 tablets (325-650 mg total) by mouth every 6 (six) hours as needed for mild pain (pain score 1-3 or temp > 100.5). 30 tablet 0   albuterol (VENTOLIN HFA) 108 (90 Base) MCG/ACT inhaler Inhale 1-2 puffs into the lungs 4 (four) times daily as needed for shortness of breath or wheezing.     amiodarone (PACERONE) 200 MG tablet Take 1 tablet (200 mg total) by mouth daily.     amLODipine (NORVASC) 2.5 MG tablet Take 2.5 mg by mouth daily.     apixaban (ELIQUIS) 5 MG TABS tablet Take 1 tablet (5 mg total) by mouth 2 (two) times daily. 180 tablet 1   baclofen (LIORESAL) 10 MG tablet Take 1 tablet (10 mg total) by mouth 3 (three) times daily as needed for muscle spasms. 30 each 0   cholecalciferol (VITAMIN D3) 25 MCG (1000 UNIT) tablet Take 1,000 Units by mouth daily.     clopidogrel (PLAVIX) 75 MG tablet Take 1 tablet (75 mg total) by mouth daily. 30 tablet 12   ezetimibe (ZETIA) 10 MG tablet Take 1 tablet (10 mg total) by mouth daily. 90 tablet 2   famotidine  (PEPCID) 10 MG tablet Take 10 mg by mouth daily as needed for heartburn or indigestion.     furosemide (LASIX) 20 MG tablet Take 20 mg by mouth daily.     guaiFENesin (MUCINEX) 600 MG 12 hr tablet Take 600 mg by mouth 2 (two) times daily as needed for to loosen phlegm.     levothyroxine (SYNTHROID, LEVOTHROID) 50 MCG tablet Take 50 mcg by mouth daily before breakfast.     nitroGLYCERIN (NITROSTAT) 0.4 MG SL tablet Place 0.4 mg under the tongue every 5 (five) minutes as needed for chest pain.     predniSONE (DELTASONE) 50 MG tablet Take one tablet daily for 5 days. 5 tablet 0   No current facility-administered medications for this visit.    Allergies:   Atorvastatin, Codeine, Crestor [rosuvastatin], Pravastatin, Simvastatin, and Ticagrelor   Social History: Social History   Socioeconomic History   Marital status: Widowed    Spouse name: Not on file   Number of children: Not on file  Years of education: Not on file   Highest education level: Not on file  Occupational History   Not on file  Tobacco Use   Smoking status: Former    Types: Cigarettes    Quit date: 06/06/1967    Years since quitting: 54.5    Passive exposure: Past   Smokeless tobacco: Former  Scientific laboratory technician Use: Never used  Substance and Sexual Activity   Alcohol use: No   Drug use: No   Sexual activity: Not Currently  Other Topics Concern   Not on file  Social History Narrative   Lives in [place].  Widowed.   Social Determinants of Health   Financial Resource Strain: Not on file  Food Insecurity: Not on file  Transportation Needs: Not on file  Physical Activity: Not on file  Stress: Not on file  Social Connections: Not on file  Intimate Partner Violence: Not on file    Family History: Family History  Problem Relation Age of Onset   Dementia Mother    Heart Problems Father    Dementia Sister    Heart Problems Sister    Other Other        no premature CAD.   Tuberculosis Maternal Grandmother     Heart Problems Paternal Grandfather      Review of Systems: All other systems reviewed and are otherwise negative except as noted above.  Physical Exam: There were no vitals filed for this visit.   GEN- The patient is well appearing, alert and oriented x 3 today.   HEENT: normocephalic, atraumatic; sclera clear, conjunctiva pink; hearing intact; oropharynx clear; neck supple, no JVP Lymph- no cervical lymphadenopathy Lungs- Clear to ausculation bilaterally, normal work of breathing.  No wheezes, rales, rhonchi Heart- Regular  rate and rhythm, no murmurs, rubs or gallops, PMI not laterally displaced GI- soft, non-tender, non-distended, bowel sounds present, no hepatosplenomegaly Extremities- no clubbing or cyanosis. Trace peripheral edema; DP/PT/radial pulses 2+ bilaterally MS- no significant deformity or atrophy Skin- warm and dry, no rash or lesion; PPM pocket well healed Psych- euthymic mood, full affect Neuro- strength and sensation are intact  PPM Interrogation-  reviewed in detail today,  See PACEART report.  EKG:  EKG is not ordered today. Personal review of ekg ordered  06/2021  shows NSR with 1st degree AV block at 66 bpm   Recent Labs: 12/23/2020: ALT 23; BUN 10; Creatinine, Ser 0.98; Potassium 4.8; Sodium 140; TSH 1.260   Wt Readings from Last 3 Encounters:  06/27/21 178 lb (80.7 kg)  02/15/21 178 lb (80.7 kg)  12/27/20 177 lb (80.3 kg)     Other studies Reviewed: Additional studies/ records that were reviewed today include: Previous EP office notes, Previous remote checks, Most recent labwork.   Assessment and Plan:  1. SND s/p Medtronic PPM  Normal PPM function See Pace Art report No changes today  2. HTN Stable on current regimen   3. Paroxysmal Atrial fibrillation  Burden <1% Continue eliquis 5 mg BID Continue amidoarone. Surveillance labs today.   Current medicines are reviewed at length with the patient today.    Labs/ tests ordered today  include:  Orders Placed This Encounter  Procedures   Comp Met (CMET)   TSH   T4, free   CBC   CUP PACEART INCLINIC DEVICE CHECK   Disposition:   Follow up with Dr. Quentin Ore in 12 months    Signed, Shirley Friar, PA-C  12/21/2021 11:12 AM  [facility] [address] [place] [address] [phone] (office) [phone] (fax)

## 2021-12-23 ENCOUNTER — Ambulatory Visit: Payer: Medicare Other | Attending: Cardiology | Admitting: Student

## 2021-12-23 ENCOUNTER — Encounter: Payer: Self-pay | Admitting: Student

## 2021-12-23 VITALS — BP 150/68 | HR 76 | Ht 59.0 in | Wt 181.0 lb

## 2021-12-23 DIAGNOSIS — I110 Hypertensive heart disease with heart failure: Secondary | ICD-10-CM | POA: Diagnosis not present

## 2021-12-23 DIAGNOSIS — Z950 Presence of cardiac pacemaker: Secondary | ICD-10-CM | POA: Diagnosis not present

## 2021-12-23 DIAGNOSIS — I480 Paroxysmal atrial fibrillation: Secondary | ICD-10-CM

## 2021-12-23 DIAGNOSIS — I495 Sick sinus syndrome: Secondary | ICD-10-CM

## 2021-12-23 DIAGNOSIS — Z79899 Other long term (current) drug therapy: Secondary | ICD-10-CM | POA: Diagnosis not present

## 2021-12-23 DIAGNOSIS — I5032 Chronic diastolic (congestive) heart failure: Secondary | ICD-10-CM

## 2021-12-23 DIAGNOSIS — R5383 Other fatigue: Secondary | ICD-10-CM

## 2021-12-23 LAB — CUP PACEART INCLINIC DEVICE CHECK
Battery Remaining Longevity: 138 mo
Battery Voltage: 3.03 V
Brady Statistic AP VP Percent: 0.47 %
Brady Statistic AP VS Percent: 86.17 %
Brady Statistic AS VP Percent: 0.03 %
Brady Statistic AS VS Percent: 13.33 %
Brady Statistic RA Percent Paced: 86.87 %
Brady Statistic RV Percent Paced: 0.5 %
Date Time Interrogation Session: 20231208091221
Implantable Lead Connection Status: 753985
Implantable Lead Connection Status: 753985
Implantable Lead Implant Date: 20210806
Implantable Lead Implant Date: 20210806
Implantable Lead Location: 753859
Implantable Lead Location: 753860
Implantable Lead Model: 5076
Implantable Lead Model: 5076
Implantable Pulse Generator Implant Date: 20210806
Lead Channel Impedance Value: 342 Ohm
Lead Channel Impedance Value: 456 Ohm
Lead Channel Impedance Value: 475 Ohm
Lead Channel Impedance Value: 551 Ohm
Lead Channel Pacing Threshold Amplitude: 0.625 V
Lead Channel Pacing Threshold Amplitude: 0.875 V
Lead Channel Pacing Threshold Pulse Width: 0.4 ms
Lead Channel Pacing Threshold Pulse Width: 0.4 ms
Lead Channel Sensing Intrinsic Amplitude: 10.75 mV
Lead Channel Sensing Intrinsic Amplitude: 12.5 mV
Lead Channel Sensing Intrinsic Amplitude: 2.375 mV
Lead Channel Sensing Intrinsic Amplitude: 2.5 mV
Lead Channel Setting Pacing Amplitude: 1.5 V
Lead Channel Setting Pacing Amplitude: 2 V
Lead Channel Setting Pacing Pulse Width: 0.4 ms
Lead Channel Setting Sensing Sensitivity: 1.2 mV
Zone Setting Status: 755011

## 2021-12-23 NOTE — Patient Instructions (Addendum)
Medication Instructions:  Your physician recommends that you continue on your current medications as directed. Please refer to the Current Medication list given to you today.  *If you need a refill on your cardiac medications before your next appointment, please call your pharmacy*  Lab Work: You will have blood work drawn today:  CBC, CMET, Free T4, and TSH    Testing/Procedures: None ordered.  Follow-Up: At [facility], you and your health needs are our priority.  As part of our continuing mission to provide you with exceptional heart care, we have created designated Provider Care Teams.  These Care Teams include your primary Cardiologist (physician) and Advanced Practice Providers (APPs -  Physician Assistants and Nurse Practitioners) who all work together to provide you with the care you need, when you need it.  We recommend signing up for the patient portal called "MyChart".  Sign up information is provided on this After Visit Summary.  MyChart is used to connect with patients for Virtual Visits (Telemedicine).  Patients are able to view lab/test results, encounter notes, upcoming appointments, etc.  Non-urgent messages can be sent to your provider as well.   To learn more about what you can do with MyChart, go to [email or web address].    Your next appointment:   Please schedule a 1 year follow up appointment with Dr. Quentin Ore.    The format for your next appointment:   In Person  Provider:   Legrand Como "Jonni Sanger" Chalmers Cater, PA-C  Remote monitoring is used to monitor your Pacemaker from home. This monitoring reduces the number of office visits required to check your device to one time per year. It allows [place] to keep an eye on the functioning of your device to ensure it is working properly. You are scheduled for a device check from home on 02/17/22. You may send your transmission at any time that day. If you have a wireless device, the transmission will be sent automatically. After your  physician reviews your transmission, you will receive a postcard with your next transmission date.  Important Information About Sugar

## 2021-12-24 LAB — COMPREHENSIVE METABOLIC PANEL
ALT: 40 IU/L — ABNORMAL HIGH (ref 0–32)
AST: 43 IU/L — ABNORMAL HIGH (ref 0–40)
Albumin/Globulin Ratio: 2.1 (ref 1.2–2.2)
Albumin: 4.6 g/dL (ref 3.7–4.7)
Alkaline Phosphatase: 80 IU/L (ref 44–121)
BUN/Creatinine Ratio: 13 (ref 12–28)
BUN: 13 mg/dL (ref 8–27)
Bilirubin Total: 0.8 mg/dL (ref 0.0–1.2)
CO2: 26 mmol/L (ref 20–29)
Calcium: 9.9 mg/dL (ref 8.7–10.3)
Chloride: 99 mmol/L (ref 96–106)
Creatinine, Ser: 1.02 mg/dL — ABNORMAL HIGH (ref 0.57–1.00)
Globulin, Total: 2.2 g/dL (ref 1.5–4.5)
Glucose: 96 mg/dL (ref 70–99)
Potassium: 4 mmol/L (ref 3.5–5.2)
Sodium: 139 mmol/L (ref 134–144)
Total Protein: 6.8 g/dL (ref 6.0–8.5)
eGFR: 53 mL/min/{1.73_m2} — ABNORMAL LOW (ref >59)

## 2021-12-24 LAB — CBC
Hematocrit: 37 % (ref 34.0–46.6)
Hemoglobin: 12.3 g/dL (ref 11.1–15.9)
MCH: 29.4 pg (ref 26.6–33.0)
MCHC: 33.2 g/dL (ref 31.5–35.7)
MCV: 89 fL (ref 79–97)
Platelets: 233 10*3/uL (ref 150–450)
RBC: 4.18 x10E6/uL (ref 3.77–5.28)
RDW: 12.9 % (ref 11.7–15.4)
WBC: 6.2 10*3/uL (ref 3.4–10.8)

## 2021-12-24 LAB — T4, FREE: Free T4: 1.61 ng/dL (ref 0.82–1.77)

## 2021-12-24 LAB — TSH: TSH: 1.5 u[IU]/mL (ref 0.450–4.500)

## 2022-01-23 ENCOUNTER — Telehealth: Payer: Self-pay | Admitting: Cardiology

## 2022-01-23 NOTE — Telephone Encounter (Signed)
Informed Dr. Bettina Gavia that the patient had done a pacer check transmission and it was normal.

## 2022-01-23 NOTE — Telephone Encounter (Signed)
Pt c/o of Chest Pain: STAT if CP now or developed within 24 hours  1. Are you having CP right now? No   2. Are you experiencing any other symptoms (ex. SOB, nausea, vomiting, sweating)? No   3. How long have you been experiencing CP? Around Christmas time was the first time, she states it happened again Friday 01/20/22  4. Is your CP continuous or coming and going? Coming and going   5. Have you taken Nitroglycerin? She did the first time it happened but not this past Friday   She states she has this unusual feeling like a "rubber band popping in her chest" and afterwards it feels very cold in her chest then it goes away. She states she usually takes her bp and hr but she doesn't write it down. She states she felt very weak after it happened like she was going to pass out. She wasn't sure if she should send a transmission if this occurs again. Please advise.  ?

## 2022-01-23 NOTE — Telephone Encounter (Signed)
Spoke with the patient and she reports on 12/24 she had an episode where she felt like a rubber band being pulled and then popping in her chest. Went up to her collar bone.  Lasted about 10 seconds.  Had a similar episode early last week.  On 1/5 she had an episode of chest tightness lasting about 15-20 minutes.  Had chills also. She felt like her BP was elevated.  Did not use NTG. Patient states she feels fine today and has no symptoms. Please advise.

## 2022-01-23 NOTE — Telephone Encounter (Signed)
Transmission received.  Normal pacemaker function.  No episodes noted.

## 2022-01-23 NOTE — Telephone Encounter (Signed)
Spoke with Dr. Agustin Cree regarding this patient's symptoms and he recommended that she have an appointment and follow up with Dr. Bettina Gavia. An appointment was made for the patient to see Dr. Bettina Gavia on 01/30/22 at 1:00 pm. Patient was informed of the appointment and had no further questions at this time.

## 2022-01-23 NOTE — Telephone Encounter (Signed)
Transmission received. I told her the nurse will review it and give her a call back.

## 2022-01-23 NOTE — Telephone Encounter (Signed)
I spoke with patient. She reports on 12/24 she had an episode where she felt like a rubber band being pulled and then popping in her chest. Went up to her collar bone.  Lasted about 10 seconds.  Had a similar episode early last week.  On 1/5 she had an episode of chest tightness lasting about 15-20 minutes.  Had chills also. She felt like her BP was elevated.  Did not use NTG.  Patient does not know how to send transmission.  Will ask device clinic to contact her to help with this.  Use of NTG reviewed with patient.  I let her know I would also make Dr Bettina Gavia aware of episodes.

## 2022-01-28 NOTE — Progress Notes (Unsigned)
Cardiology Office Note:    Date:  01/30/2022   ID:  YEILYN GENT, DOB 02/20/1930, MRN [id number]  PCP:  Leonard Downing, MD  Cardiologist:  Shirlee More, MD    Referring MD: Leonard Downing, *    ASSESSMENT:    1. Coronary artery disease of bypass graft of native heart with stable angina pectoris ([place])   2. Hx of CABG   3. H/O mitral valve repair   4. PAF (paroxysmal atrial fibrillation) ([place])   5. On amiodarone therapy   6. Chronic anticoagulation   7. Pacemaker   8. Hypertensive heart disease with chronic diastolic congestive heart failure ([place])   9. Mixed hyperlipidemia   10. Stage 3a chronic kidney disease (HCC)    PLAN:    In order of problems listed above:  From a cardiology perspective I think she is doing well following remote bypass mitral valve repair she is maintaining sinus rhythm on low-dose amiodarone no evidence of toxicity and she remains anticoagulated stable pacemaker function recently seen in our device clinic. On physical exam she is tender over the body of the sternum and the left sternal costochondral junction and I think when she is having his chest wall pain related to her bypass surgery and general frailty.  We discussed doing further evaluation like a perfusion study and she declines at this time. Stable heart failure she increased her diuretic to twice a day blood pressure is variable but typically is in range for her age group goal systolic less than 132 Continue nonstatin lipid-lowering therapy Improved GFR 53 cc 1 month ago   Next appointment: 6 months   Medication Adjustments/Labs and Tests Ordered: Current medicines are reviewed at length with the patient today.  Concerns regarding medicines are outlined above.  No orders of the defined types were placed in this encounter.  No orders of the defined types were placed in this encounter.   Chief Complaint  Patient presents with   Follow-up   Coronary Artery Disease   Chest  Pain    History of Present Illness:    AMAZING COWMAN is a 89 y.o. female with a hx of complex heart disease with CAD CABG and mitral valve repair in 2003 subsequent inferior ST segment elevation MI in 2015 with PCI and drug-eluting stent to the saphenous vein graft paroxysmal atrial fibrillation suppressed with amiodarone bradycardia requiring permanent pacemaker hyperlipidemia statin intolerance and heart failure with preserved ejection fraction last seen 06/27/2021.  Compliance with diet, lifestyle and medications: Yes  An episode in December what is best described as a popping in the sternal area she had chest pain at rest subsequently was sore afterwards had another episode where it bothered again at the beginning of January Not angina no shortness of breath palpitation or syncope Has had no falls or trauma but her son says she uses her upper extremities at times will stretch.  Trends blood pressure at home it is variable but generally is less than 440 systolic Past Medical History:  Diagnosis Date   Anxiety attack 01/03/2016   Aphasia 01/03/2016   Arthritis    CAD (coronary artery disease) of artery bypass graft, s/p PCI stent to VG->RCA 04/30/15 07/14/2013   Catheterization August/2003 showing severe three-vessel disease and moderate mitral regurgitation 08/28/2001 CABG with internal mammary graft to LAD, vein graft to circumflex, and vein graft to posterolateral branch and mitral valve repair by Dr. Roxy Manns Inferior infarction 07/12/13 with occlusion of the vein graft to right coronary  artery, stenting with a 3.5 x 18 mm resolute stent. Patent internal ma   CAD (coronary artery disease), native coronary artery 07/14/2013   Catheterization August/2003 showing severe three-vessel disease and moderate mitral regurgitation 08/28/2001 CABG with internal mammary graft to LAD, vein graft to circumflex, and vein graft to posterolateral branch and mitral valve repair by Dr. Roxy Manns Inferior infarction  07/12/13 with occlusion of the vein graft to right coronary artery, stenting with a 3.518 mm resolute stents. Patent internal mammary graft with competitive flow in the distal vessel, occluded right coronary artery, occluded circumflex, patent saphenous vein graft to circumflex with mild/moderate disease proximally, minimal LV damage    Chest pain 08/20/2019   Chronic diastolic CHF (congestive heart failure) ([place])    a. EF 60% by LV gram 01/2009.   Coronary artery disease    a. s/p MI in 1980;  b. 2003 CABG x 3 (LIMA->LAD, VG->OM, VG->RPL);  c. 01/2009 Cath: 3/3 patent grafts, native 3VD, EF 60%.   Diverticulitis    Dyspnea 01/03/2016   Elevated troponin    GERD (gastroesophageal reflux disease)    H/O mitral valve repair    a. 2003   History of kidney stones    Hyperlipidemia 07/14/2013   Intolerance to multiple statins including Crestor, pravastatin, and Lipitor    Hypertension    Hypertension, uncontrolled 07/19/2014   Hypertensive heart disease 07/14/2013   Hypothyroidism    NSTEMI (non-ST elevated myocardial infarction) ([place])    Other hyperlipidemia    PAF (paroxysmal atrial fibrillation) ([place])    Pneumonia    Presence of permanent cardiac pacemaker 08/22/2019   S/P partial hysterectomy 1977   Sinus node dysfunction [facility])     Past Surgical History:  Procedure Laterality Date   ABDOMINAL HYSTERECTOMY  1977   APPENDECTOMY  1950   BLADDER SUSPENSION     tac   CARDIAC CATHETERIZATION  03,05,11   CARDIAC CATHETERIZATION N/A 04/30/2015   Procedure: Left Heart Cath and Cors/Grafts Angiography;  Surgeon: Belva Crome, MD;  Location: [place] [facility];  Service: Cardiovascular;  Laterality: N/A;   CARDIAC CATHETERIZATION N/A 04/30/2015   Procedure: Coronary Stent Intervention;  Surgeon: Belva Crome, MD;  Location: [place] [facility];  Service: Cardiovascular;  Laterality: N/A;  Proximal and Distal SVG to the PLA   CARDIAC CATHETERIZATION N/A 01/06/2016   Procedure: Left Heart Cath and  Cors/Grafts Angiography;  Surgeon: Jettie Booze, MD;  Location: [place] [facility];  Service: Cardiovascular;  Laterality: N/A;   CARPAL TUNNEL RELEASE     right and left   CHOLECYSTECTOMY     CORONARY ARTERY BYPASS GRAFT  2003   KNEE ARTHROSCOPY     left   LEFT HEART CATHETERIZATION WITH CORONARY ANGIOGRAM N/A 07/12/2013   Procedure: LEFT HEART CATHETERIZATION WITH CORONARY ANGIOGRAM;  Surgeon: Jettie Booze, MD;  Location: [facility] CATH LAB;  Service: Cardiovascular;  Laterality: N/A;   LEFT TOTAL HIP ARTHROPLASTY ANTERIOR APPROACH (Left Hip)  03/16/2020   MITRAL VALVE REPAIR  2003   with cabg   PACEMAKER IMPLANT N/A 08/22/2019   Procedure: PACEMAKER IMPLANT;  Surgeon: Constance Haw, MD;  Location: [place] [facility];  Service: Cardiovascular;  Laterality: N/A;   THYROIDECTOMY  1968   THYROIDECTOMY, PARTIAL     left   TOTAL HIP ARTHROPLASTY Left 03/16/2020   Procedure: LEFT TOTAL HIP ARTHROPLASTY ANTERIOR APPROACH;  Surgeon: Mcarthur Rossetti, MD;  Location: [place];  Service: Orthopedics;  Laterality: Left;  Current Medications: Current Meds  Medication Sig   acetaminophen (TYLENOL) 325 MG tablet Take 1-2 tablets (325-650 mg total) by mouth every 6 (six) hours as needed for mild pain (pain score 1-3 or temp > 100.5).   albuterol (VENTOLIN HFA) 108 (90 Base) MCG/ACT inhaler Inhale 1-2 puffs into the lungs 4 (four) times daily as needed for shortness of breath or wheezing.   amiodarone (PACERONE) 200 MG tablet Take 1 tablet (200 mg total) by mouth daily.   amLODipine (NORVASC) 2.5 MG tablet Take 2.5 mg by mouth daily.   apixaban (ELIQUIS) 5 MG TABS tablet Take 1 tablet (5 mg total) by mouth 2 (two) times daily.   cholecalciferol (VITAMIN D3) 25 MCG (1000 UNIT) tablet Take 1,000 Units by mouth daily.   clopidogrel (PLAVIX) 75 MG tablet Take 1 tablet (75 mg total) by mouth daily.   ezetimibe (ZETIA) 10 MG tablet Take 1 tablet (10 mg total) by mouth daily.   famotidine  (PEPCID) 10 MG tablet Take 10 mg by mouth daily as needed for heartburn or indigestion.   furosemide (LASIX) 20 MG tablet Take 20 mg by mouth 2 (two) times daily.   guaiFENesin (MUCINEX) 600 MG 12 hr tablet Take 600 mg by mouth 2 (two) times daily as needed for to loosen phlegm.   levothyroxine (SYNTHROID, LEVOTHROID) 50 MCG tablet Take 50 mcg by mouth daily before breakfast.   nitroGLYCERIN (NITROSTAT) 0.4 MG SL tablet Place 0.4 mg under the tongue every 5 (five) minutes as needed for chest pain.     Allergies:   Atorvastatin, Codeine, Crestor [rosuvastatin], Pravastatin, Simvastatin, and Ticagrelor   Social History   Socioeconomic History   Marital status: Widowed    Spouse name: Not on file   Number of children: Not on file   Years of education: Not on file   Highest education level: Not on file  Occupational History   Not on file  Tobacco Use   Smoking status: Former    Types: Cigarettes    Quit date: 06/06/1967    Years since quitting: 75.6    Passive exposure: Past   Smokeless tobacco: Former  Scientific laboratory technician Use: Never used  Substance and Sexual Activity   Alcohol use: No   Drug use: No   Sexual activity: Not Currently  Other Topics Concern   Not on file  Social History Narrative   Lives in [place].  Widowed.   Social Determinants of Health   Financial Resource Strain: Not on file  Food Insecurity: Not on file  Transportation Needs: Not on file  Physical Activity: Not on file  Stress: Not on file  Social Connections: Not on file     Family History: The patient's family history includes Dementia in her mother and sister; Heart Problems in her father, paternal grandfather, and sister; Other in an other family member; Tuberculosis in her maternal grandmother. ROS:   Please see the history of present illness.    All other systems reviewed and are negative.  EKGs/Labs/Other Studies Reviewed:    The following studies were reviewed today:  EKG:  EKG ordered  today and personally reviewed.  The ekg ordered today demonstrates dual-chamber atrial ventricular paced rhythm  Recent Labs: 12/23/2021: ALT 40; BUN 13; Creatinine, Ser 1.02; Hemoglobin 12.3; Platelets 233; Potassium 4.0; Sodium 139; TSH 1.500  Recent Lipid Panel    Component Value Date/Time   CHOL 180 04/30/2015 0548   TRIG 249 (H) 04/30/2015 0548   HDL 27 (L)  04/30/2015 0548   CHOLHDL 6.7 04/30/2015 0548   VLDL 50 (H) 04/30/2015 0548   LDLCALC 103 (H) 04/30/2015 0548    Physical Exam:    VS:  BP (!) 176/74 (BP Location: Left Arm, Patient Position: Sitting)   Pulse 79   Ht 4\' 11"  (1.499 m)   Wt 176 lb (79.8 kg)   SpO2 98%   BMI 35.55 kg/m     Wt Readings from Last 3 Encounters:  01/30/22 176 lb (79.8 kg)  12/23/21 181 lb (82.1 kg)  06/27/21 178 lb (80.7 kg)     GEN:  Well nourished, well developed in no acute distress HEENT: Normal NECK: No JVD; No carotid bruits LYMPHATICS: No lymphadenopathy CARDIAC: RRR, no murmurs, rubs, gallops RESPIRATORY:  Clear to auscultation without rales, wheezing or rhonchi  ABDOMEN: Soft, non-tender, non-distended MUSCULOSKELETAL:  No edema; No deformity  SKIN: Warm and dry NEUROLOGIC:  Alert and oriented x 3 PSYCHIATRIC:  Normal affect    Signed, Shirlee More, MD  01/30/2022 1:38 PM    [facility]

## 2022-01-30 ENCOUNTER — Ambulatory Visit: Payer: Medicare Other | Attending: Cardiology | Admitting: Cardiology

## 2022-01-30 ENCOUNTER — Encounter: Payer: Self-pay | Admitting: Cardiology

## 2022-01-30 VITALS — BP 160/74 | HR 79 | Ht 59.0 in | Wt 176.0 lb

## 2022-01-30 DIAGNOSIS — I110 Hypertensive heart disease with heart failure: Secondary | ICD-10-CM

## 2022-01-30 DIAGNOSIS — N1831 Chronic kidney disease, stage 3a: Secondary | ICD-10-CM

## 2022-01-30 DIAGNOSIS — Z951 Presence of aortocoronary bypass graft: Secondary | ICD-10-CM

## 2022-01-30 DIAGNOSIS — E782 Mixed hyperlipidemia: Secondary | ICD-10-CM

## 2022-01-30 DIAGNOSIS — I5032 Chronic diastolic (congestive) heart failure: Secondary | ICD-10-CM

## 2022-01-30 DIAGNOSIS — I25708 Atherosclerosis of coronary artery bypass graft(s), unspecified, with other forms of angina pectoris: Secondary | ICD-10-CM

## 2022-01-30 DIAGNOSIS — I480 Paroxysmal atrial fibrillation: Secondary | ICD-10-CM

## 2022-01-30 DIAGNOSIS — Z9889 Other specified postprocedural states: Secondary | ICD-10-CM | POA: Diagnosis not present

## 2022-01-30 DIAGNOSIS — Z79899 Other long term (current) drug therapy: Secondary | ICD-10-CM

## 2022-01-30 DIAGNOSIS — Z950 Presence of cardiac pacemaker: Secondary | ICD-10-CM

## 2022-01-30 DIAGNOSIS — Z7901 Long term (current) use of anticoagulants: Secondary | ICD-10-CM

## 2022-01-30 NOTE — Patient Instructions (Signed)
Medication Instructions:  Your physician recommends that you continue on your current medications as directed. Please refer to the Current Medication list given to you today.  *If you need a refill on your cardiac medications before your next appointment, please call your pharmacy*   Lab Work: None If you have labs (blood work) drawn today and your tests are completely normal, you will receive your results only by: MyChart Message (if you have MyChart) OR A paper copy in the mail If you have any lab test that is abnormal or we need to change your treatment, we will call you to review the results.   Testing/Procedures: None   Follow-Up: At [facility], you and your health needs are our priority.  As part of our continuing mission to provide you with exceptional heart care, we have created designated Provider Care Teams.  These Care Teams include your primary Cardiologist (physician) and Advanced Practice Providers (APPs -  Physician Assistants and Nurse Practitioners) who all work together to provide you with the care you need, when you need it.  We recommend signing up for the patient portal called "MyChart".  Sign up information is provided on this After Visit Summary.  MyChart is used to connect with patients for Virtual Visits (Telemedicine).  Patients are able to view lab/test results, encounter notes, upcoming appointments, etc.  Non-urgent messages can be sent to your provider as well.   To learn more about what you can do with MyChart, go to [email or web address].    Your next appointment:   6 month(s)  Provider:   Brian Munley, MD    Other Instructions None  

## 2022-02-08 ENCOUNTER — Encounter: Payer: Medicare Other | Admitting: Cardiology

## 2022-02-17 ENCOUNTER — Ambulatory Visit: Payer: Medicare Other

## 2022-02-17 DIAGNOSIS — I495 Sick sinus syndrome: Secondary | ICD-10-CM | POA: Diagnosis not present

## 2022-02-17 LAB — CUP PACEART REMOTE DEVICE CHECK
Battery Remaining Longevity: 135 mo
Battery Voltage: 3.03 V
Brady Statistic AP VP Percent: 0.06 %
Brady Statistic AP VS Percent: 86.3 %
Brady Statistic AS VP Percent: 0.03 %
Brady Statistic AS VS Percent: 13.61 %
Brady Statistic RA Percent Paced: 86.4 %
Brady Statistic RV Percent Paced: 0.09 %
Date Time Interrogation Session: 20240201201721
Implantable Lead Connection Status: 753985
Implantable Lead Connection Status: 753985
Implantable Lead Implant Date: 20210806
Implantable Lead Implant Date: 20210806
Implantable Lead Location: 753859
Implantable Lead Location: 753860
Implantable Lead Model: 5076
Implantable Lead Model: 5076
Implantable Pulse Generator Implant Date: 20210806
Lead Channel Impedance Value: 323 Ohm
Lead Channel Impedance Value: 399 Ohm
Lead Channel Impedance Value: 418 Ohm
Lead Channel Impedance Value: 475 Ohm
Lead Channel Pacing Threshold Amplitude: 0.5 V
Lead Channel Pacing Threshold Amplitude: 1 V
Lead Channel Pacing Threshold Pulse Width: 0.4 ms
Lead Channel Pacing Threshold Pulse Width: 0.4 ms
Lead Channel Sensing Intrinsic Amplitude: 10.125 mV
Lead Channel Sensing Intrinsic Amplitude: 10.125 mV
Lead Channel Sensing Intrinsic Amplitude: 2.25 mV
Lead Channel Sensing Intrinsic Amplitude: 2.25 mV
Lead Channel Setting Pacing Amplitude: 1.5 V
Lead Channel Setting Pacing Amplitude: 2 V
Lead Channel Setting Pacing Pulse Width: 0.4 ms
Lead Channel Setting Sensing Sensitivity: 1.2 mV
Zone Setting Status: 755011

## 2022-03-07 NOTE — Progress Notes (Signed)
Remote pacemaker transmission.   

## 2022-05-19 ENCOUNTER — Ambulatory Visit (INDEPENDENT_AMBULATORY_CARE_PROVIDER_SITE_OTHER): Payer: Medicare Other

## 2022-05-19 DIAGNOSIS — I495 Sick sinus syndrome: Secondary | ICD-10-CM

## 2022-05-19 LAB — CUP PACEART REMOTE DEVICE CHECK
Battery Remaining Longevity: 132 mo
Battery Voltage: 3.02 V
Brady Statistic AP VP Percent: 0.06 %
Brady Statistic AP VS Percent: 95.59 %
Brady Statistic AS VP Percent: 0.02 %
Brady Statistic AS VS Percent: 4.33 %
Brady Statistic RA Percent Paced: 95.66 %
Brady Statistic RV Percent Paced: 0.07 %
Date Time Interrogation Session: 20240503072520
Implantable Lead Connection Status: 753985
Implantable Lead Connection Status: 753985
Implantable Lead Implant Date: 20210806
Implantable Lead Implant Date: 20210806
Implantable Lead Location: 753859
Implantable Lead Location: 753860
Implantable Lead Model: 5076
Implantable Lead Model: 5076
Implantable Pulse Generator Implant Date: 20210806
Lead Channel Impedance Value: 342 Ohm
Lead Channel Impedance Value: 437 Ohm
Lead Channel Impedance Value: 475 Ohm
Lead Channel Impedance Value: 551 Ohm
Lead Channel Pacing Threshold Amplitude: 0.5 V
Lead Channel Pacing Threshold Amplitude: 0.875 V
Lead Channel Pacing Threshold Pulse Width: 0.4 ms
Lead Channel Pacing Threshold Pulse Width: 0.4 ms
Lead Channel Sensing Intrinsic Amplitude: 11.375 mV
Lead Channel Sensing Intrinsic Amplitude: 11.375 mV
Lead Channel Sensing Intrinsic Amplitude: 2.875 mV
Lead Channel Sensing Intrinsic Amplitude: 2.875 mV
Lead Channel Setting Pacing Amplitude: 1.5 V
Lead Channel Setting Pacing Amplitude: 2 V
Lead Channel Setting Pacing Pulse Width: 0.4 ms
Lead Channel Setting Sensing Sensitivity: 1.2 mV
Zone Setting Status: 755011

## 2022-05-22 ENCOUNTER — Ambulatory Visit: Payer: Medicare Other

## 2022-06-07 NOTE — Progress Notes (Signed)
Remote pacemaker transmission.   

## 2022-08-18 ENCOUNTER — Ambulatory Visit: Payer: Medicare Other

## 2022-08-18 DIAGNOSIS — I495 Sick sinus syndrome: Secondary | ICD-10-CM | POA: Diagnosis not present

## 2022-08-18 LAB — CUP PACEART REMOTE DEVICE CHECK
Battery Remaining Longevity: 128 mo
Battery Voltage: 3.02 V
Brady Statistic AP VP Percent: 0.06 %
Brady Statistic AP VS Percent: 89.35 %
Brady Statistic AS VP Percent: 0.02 %
Brady Statistic AS VS Percent: 10.56 %
Brady Statistic RA Percent Paced: 89.44 %
Brady Statistic RV Percent Paced: 0.08 %
Date Time Interrogation Session: 20240802023937
Implantable Lead Connection Status: 753985
Implantable Lead Connection Status: 753985
Implantable Lead Implant Date: 20210806
Implantable Lead Implant Date: 20210806
Implantable Lead Location: 753859
Implantable Lead Location: 753860
Implantable Lead Model: 5076
Implantable Lead Model: 5076
Implantable Pulse Generator Implant Date: 20210806
Lead Channel Impedance Value: 323 Ohm
Lead Channel Impedance Value: 399 Ohm
Lead Channel Impedance Value: 399 Ohm
Lead Channel Impedance Value: 456 Ohm
Lead Channel Pacing Threshold Amplitude: 0.5 V
Lead Channel Pacing Threshold Amplitude: 1 V
Lead Channel Pacing Threshold Pulse Width: 0.4 ms
Lead Channel Pacing Threshold Pulse Width: 0.4 ms
Lead Channel Sensing Intrinsic Amplitude: 11.125 mV
Lead Channel Sensing Intrinsic Amplitude: 11.125 mV
Lead Channel Sensing Intrinsic Amplitude: 2.75 mV
Lead Channel Sensing Intrinsic Amplitude: 2.75 mV
Lead Channel Setting Pacing Amplitude: 1.5 V
Lead Channel Setting Pacing Amplitude: 2 V
Lead Channel Setting Pacing Pulse Width: 0.4 ms
Lead Channel Setting Sensing Sensitivity: 1.2 mV
Zone Setting Status: 755011

## 2022-08-21 ENCOUNTER — Ambulatory Visit: Payer: Medicare Other

## 2022-08-28 NOTE — Progress Notes (Signed)
Remote pacemaker transmission.   

## 2022-08-31 NOTE — Progress Notes (Signed)
Cardiology Office Note:    Date:  09/01/2022   ID:  Heather [name], DOB September 13, 1930, MRN [id number]  PCP:  Kaleen Mask, MD  Cardiologist:  Norman Herrlich, MD    Referring MD: Kaleen Mask, *please do a CMP and TSH T3-T4 every 6 months with amiodarone therapy   ASSESSMENT:    1. Coronary artery disease of bypass graft of native heart with stable angina pectoris (HCC)   2. Hx of CABG   3. H/O mitral valve repair   4. PAF (paroxysmal atrial fibrillation) (HCC)   5. On amiodarone therapy   6. Pacemaker   7. Hypertensive heart disease with chronic diastolic congestive heart failure (HCC)   8. Mixed hyperlipidemia   9. Stage 3a chronic kidney disease (HCC)    PLAN:    In order of problems listed above:  Doing well from a cardiology perspective she is having no angina with saphenous vein graft disease and stent she will remain on combined Eliquis and clopidogrel along with her lipid-lowering therapy nonstatin. Good result from mitral valve repair on echocardiogram last year no evidence of heart failure Maintaining sinus rhythm with low-dose amiodarone and pacemaker will follow in our device clinic Continue current lipid-lowering treatment   Next appointment: 6 months   Medication Adjustments/Labs and Tests Ordered: Current medicines are reviewed at length with the patient today.  Concerns regarding medicines are outlined above.  Orders Placed This Encounter  Procedures   EKG 12-Lead   No orders of the defined types were placed in this encounter.    History of Present Illness:    Heather [name] is a 89 y.o. female with a hx of complex heart disease with CAD CABG mitral valve repair in 2003 subsequent inferior ST elevation MI 2015 with PCI and drug-eluting stent to the saphenous vein graft right coronary artery paroxysmal atrial fibrillation suppressed with amiodarone bradycardia requiring permanent pacemaker hyperlipidemia with statin intolerance and heart  failure with preserved ejection fraction last seen 01/30/2022.  Compliance with diet, lifestyle and medications: Yes  She continues to do well with seeing frequent with her PCP is meticulous checking her lab work From a cardiac perspective she is doing well she is not having edema shortness of breath chest pain palpitation or syncope She tolerates combined anticoagulant antiplatelet without bleeding and takes Pepcid for GI prophylaxis She tolerates her lipid-lowering therapy without muscle pain or weakness She attained sinus rhythm on low-dose amiodarone without toxicity  Recent labs 05/24/2022 CMP with normal liver function test sodium 139 potassium 3.9 GFR 65 cc/min TSH normal 1.20 December 2021 Past Medical History:  Diagnosis Date   Anxiety attack 01/03/2016   Aphasia 01/03/2016   Arthritis    CAD (coronary artery disease) of artery bypass graft, s/p PCI stent to VG->RCA 04/30/15 07/14/2013   Catheterization August/2003 showing severe three-vessel disease and moderate mitral regurgitation 08/28/2001 CABG with internal mammary graft to LAD, vein graft to circumflex, and vein graft to posterolateral branch and mitral valve repair by Dr. Cornelius Moras Inferior infarction 07/12/13 with occlusion of the vein graft to right coronary artery, stenting with a 3.5 x 18 mm resolute stent. Patent internal ma   CAD (coronary artery disease), native coronary artery 07/14/2013   Catheterization August/2003 showing severe three-vessel disease and moderate mitral regurgitation 08/28/2001 CABG with internal mammary graft to LAD, vein graft to circumflex, and vein graft to posterolateral branch and mitral valve repair by Dr. Cornelius Moras Inferior infarction 07/12/13 with occlusion of the vein graft to right  coronary artery, stenting with a 3.518 mm resolute stents. Patent internal mammary graft with competitive flow in the distal vessel, occluded right coronary artery, occluded circumflex, patent saphenous vein graft to circumflex with  mild/moderate disease proximally, minimal LV damage    Chest pain 08/20/2019   Chronic diastolic CHF (congestive heart failure) (HCC)    a. EF 60% by LV gram 01/2009.   Coronary artery disease    a. s/p MI in 1980;  b. 2003 CABG x 3 (LIMA->LAD, VG->OM, VG->RPL);  c. 01/2009 Cath: 3/3 patent grafts, native 3VD, EF 60%.   Diverticulitis    Dyspnea 01/03/2016   Elevated troponin    GERD (gastroesophageal reflux disease)    H/O mitral valve repair    a. 2003   History of kidney stones    Hyperlipidemia 07/14/2013   Intolerance to multiple statins including Crestor, pravastatin, and Lipitor    Hypertension    Hypertension, uncontrolled 07/19/2014   Hypertensive heart disease 07/14/2013   Hypothyroidism    NSTEMI (non-ST elevated myocardial infarction) (HCC)    Other hyperlipidemia    PAF (paroxysmal atrial fibrillation) (HCC)    Pneumonia    Presence of permanent cardiac pacemaker 08/22/2019   S/P partial hysterectomy 1977   Sinus node dysfunction (HCC)     Current Medications: Current Meds  Medication Sig   acetaminophen (TYLENOL) 325 MG tablet Take 1-2 tablets (325-650 mg total) by mouth every 6 (six) hours as needed for mild pain (pain score 1-3 or temp > 100.5).   albuterol (VENTOLIN HFA) 108 (90 Base) MCG/ACT inhaler Inhale 1-2 puffs into the lungs 4 (four) times daily as needed for shortness of breath or wheezing.   amiodarone (PACERONE) 200 MG tablet Take 1 tablet (200 mg total) by mouth daily.   amLODipine (NORVASC) 2.5 MG tablet Take 2.5 mg by mouth daily.   cholecalciferol (VITAMIN D3) 25 MCG (1000 UNIT) tablet Take 1,000 Units by mouth daily.   clopidogrel (PLAVIX) 75 MG tablet Take 1 tablet (75 mg total) by mouth daily.   famotidine (PEPCID) 10 MG tablet Take 10 mg by mouth daily as needed for heartburn or indigestion.   furosemide (LASIX) 20 MG tablet Take 20 mg by mouth 2 (two) times daily.   guaiFENesin (MUCINEX) 600 MG 12 hr tablet Take 600 mg by mouth 2 (two) times daily as  needed for to loosen phlegm.   levothyroxine (SYNTHROID, LEVOTHROID) 50 MCG tablet Take 50 mcg by mouth daily before breakfast.   nitroGLYCERIN (NITROSTAT) 0.4 MG SL tablet Place 0.4 mg under the tongue every 5 (five) minutes as needed for chest pain.      EKGs/Labs/Other Studies Reviewed:    The following studies were reviewed today:    EKG Interpretation Date/Time:  Friday September 01 2022 09:29:54 EDT Ventricular Rate:  66 PR Interval:  314 QRS Duration:  148 QT Interval:  468 QTC Calculation: 490 R Axis:   103  Text Interpretation: Atrial-paced rhythm with prolonged AV conduction Right bundle branch block When compared with ECG of 12-Nov-2020 16:59, PREVIOUS ECG IS PRESENT Confirmed by Norman Herrlich ([id number]) on 09/01/2022 9:31:22 AM   Recent Labs: 12/23/2021: ALT 40; BUN 13; Creatinine, Ser 1.02; Hemoglobin 12.3; Platelets 233; Potassium 4.0; Sodium 139; TSH 1.500  Recent Lipid Panel    Component Value Date/Time   CHOL 180 04/30/2015 0548   TRIG 249 (H) 04/30/2015 0548   HDL 27 (L) 04/30/2015 0548   CHOLHDL 6.7 04/30/2015 0548   VLDL 50 (H) 04/30/2015 6045  LDLCALC 103 (H) 04/30/2015 0548    Physical Exam:    VS:  BP 136/78 (BP Location: Left Arm, Patient Position: Sitting, Cuff Size: Normal)   Pulse 66   Ht 4\' 11"  (1.499 m)   Wt 174 lb 12.8 oz (79.3 kg)   SpO2 97%   BMI 35.31 kg/m     Wt Readings from Last 3 Encounters:  09/01/22 174 lb 12.8 oz (79.3 kg)  01/30/22 176 lb (79.8 kg)  12/23/21 181 lb (82.1 kg)     GEN:  Well nourished, well developed in no acute distress HEENT: Normal NECK: No JVD; No carotid bruits LYMPHATICS: No lymphadenopathy CARDIAC: RRR, no murmurs, rubs, gallops RESPIRATORY:  Clear to auscultation without rales, wheezing or rhonchi  ABDOMEN: Soft, non-tender, non-distended MUSCULOSKELETAL:  No edema; No deformity  SKIN: Warm and dry NEUROLOGIC:  Alert and oriented x 3 PSYCHIATRIC:  Normal affect    Signed, Norman Herrlich, MD   09/01/2022 9:42 AM    [facility]

## 2022-09-01 ENCOUNTER — Encounter: Payer: Self-pay | Admitting: Cardiology

## 2022-09-01 ENCOUNTER — Ambulatory Visit: Payer: Medicare Other | Attending: Cardiology | Admitting: Cardiology

## 2022-09-01 VITALS — BP 136/78 | HR 66 | Ht 59.0 in | Wt 174.8 lb

## 2022-09-01 DIAGNOSIS — I480 Paroxysmal atrial fibrillation: Secondary | ICD-10-CM

## 2022-09-01 DIAGNOSIS — I25708 Atherosclerosis of coronary artery bypass graft(s), unspecified, with other forms of angina pectoris: Secondary | ICD-10-CM

## 2022-09-01 DIAGNOSIS — Z951 Presence of aortocoronary bypass graft: Secondary | ICD-10-CM

## 2022-09-01 DIAGNOSIS — Z9889 Other specified postprocedural states: Secondary | ICD-10-CM

## 2022-09-01 DIAGNOSIS — Z950 Presence of cardiac pacemaker: Secondary | ICD-10-CM

## 2022-09-01 DIAGNOSIS — N1831 Chronic kidney disease, stage 3a: Secondary | ICD-10-CM

## 2022-09-01 DIAGNOSIS — Z79899 Other long term (current) drug therapy: Secondary | ICD-10-CM

## 2022-09-01 DIAGNOSIS — I5032 Chronic diastolic (congestive) heart failure: Secondary | ICD-10-CM

## 2022-09-01 DIAGNOSIS — I110 Hypertensive heart disease with heart failure: Secondary | ICD-10-CM

## 2022-09-01 DIAGNOSIS — E782 Mixed hyperlipidemia: Secondary | ICD-10-CM

## 2022-09-01 NOTE — Patient Instructions (Signed)

## 2022-11-16 LAB — LAB REPORT - SCANNED: EGFR: 58

## 2022-11-17 ENCOUNTER — Ambulatory Visit (INDEPENDENT_AMBULATORY_CARE_PROVIDER_SITE_OTHER): Payer: Medicare Other

## 2022-11-17 DIAGNOSIS — I495 Sick sinus syndrome: Secondary | ICD-10-CM

## 2022-11-17 LAB — CUP PACEART REMOTE DEVICE CHECK
Battery Remaining Longevity: 125 mo
Battery Voltage: 3.02 V
Brady Statistic AP VP Percent: 0.08 %
Brady Statistic AP VS Percent: 93.22 %
Brady Statistic AS VP Percent: 0.03 %
Brady Statistic AS VS Percent: 6.68 %
Brady Statistic RA Percent Paced: 93.32 %
Brady Statistic RV Percent Paced: 0.1 %
Date Time Interrogation Session: 20241031235122
Implantable Lead Connection Status: 753985
Implantable Lead Connection Status: 753985
Implantable Lead Implant Date: 20210806
Implantable Lead Implant Date: 20210806
Implantable Lead Location: 753859
Implantable Lead Location: 753860
Implantable Lead Model: 5076
Implantable Lead Model: 5076
Implantable Pulse Generator Implant Date: 20210806
Lead Channel Impedance Value: 323 Ohm
Lead Channel Impedance Value: 418 Ohm
Lead Channel Impedance Value: 437 Ohm
Lead Channel Impedance Value: 532 Ohm
Lead Channel Pacing Threshold Amplitude: 0.5 V
Lead Channel Pacing Threshold Amplitude: 1.125 V
Lead Channel Pacing Threshold Pulse Width: 0.4 ms
Lead Channel Pacing Threshold Pulse Width: 0.4 ms
Lead Channel Sensing Intrinsic Amplitude: 12.5 mV
Lead Channel Sensing Intrinsic Amplitude: 12.5 mV
Lead Channel Sensing Intrinsic Amplitude: 2.875 mV
Lead Channel Sensing Intrinsic Amplitude: 2.875 mV
Lead Channel Setting Pacing Amplitude: 1.5 V
Lead Channel Setting Pacing Amplitude: 2.25 V
Lead Channel Setting Pacing Pulse Width: 0.4 ms
Lead Channel Setting Sensing Sensitivity: 1.2 mV
Zone Setting Status: 755011

## 2022-11-20 ENCOUNTER — Ambulatory Visit: Payer: Medicare Other

## 2022-11-29 NOTE — Progress Notes (Signed)
Remote pacemaker transmission.   

## 2022-12-18 NOTE — Progress Notes (Signed)
  Electrophysiology Office Note:   ID:  Heather [name], DOB 10-16-1930, MRN [id number]  Primary Cardiologist: Norman Herrlich, MD Electrophysiologist: Lanier Prude, MD      History of Present Illness:   Heather [name] is a 89 y.o. female with h/o CAD s/p CABG, PAF, HTN, and SND s/p PPM seen today for routine electrophysiology followup.   Since last being seen in our clinic the patient reports doing very well. BP at home runs ~140-160. Takes amlodipine at bedtime. Otherwise,  she denies chest pain, palpitations, dyspnea, PND, orthopnea, nausea, vomiting, dizziness, syncope, edema, weight gain, or early satiety. Remains as active as she can be, denies issues with ADLs. Feels overall better after prior hip surgery.  Review of systems complete and found to be negative unless listed in HPI.   EP Information / Studies Reviewed:    EKG is not ordered today. EKG from 09/01/2022 reviewed which showed AP-VS at 66 bpm       PPM Interrogation-  reviewed in detail today,  See PACEART report.  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  Physical Exam:   VS:  BP (!) 167/72 (BP Location: Left Arm, Patient Position: Sitting, Cuff Size: Normal)   Pulse 87   Ht 4\' 11"  (1.499 m)   Wt 172 lb (78 kg)   SpO2 94%   BMI 34.74 kg/m    Wt Readings from Last 3 Encounters:  12/25/22 172 lb (78 kg)  09/01/22 174 lb 12.8 oz (79.3 kg)  01/30/22 176 lb (79.8 kg)     GEN: Well nourished, well developed in no acute distress NECK: No JVD; No carotid bruits CARDIAC: Regular rate and rhythm, no murmurs, rubs, gallops RESPIRATORY:  Clear to auscultation without rales, wheezing or rhonchi  ABDOMEN: Soft, non-tender, non-distended EXTREMITIES:  No edema; No deformity   ASSESSMENT AND PLAN:    SND s/p Medtronic PPM  Normal PPM function See Pace Art report No changes today  HTN Recheck 144/68  Increase amlodipine to 5 mg daily  Paroxysmal atrial  fibrillation Burden <1% Continue amiodarone 200 mg daily Surveillance labs today.  Continue Eliquis 5 mg BID for CHA2DS2/VASc of at least 6  CAD s/p CABG No s/s ischemia  Disposition:   Follow up with Dr. Lalla Brothers in 12 months  Pt left prior to lab draw, will ask her to return at her leisure for amiodarone lab work within the next few weeks.   Signed, Graciella Freer, PA-C

## 2022-12-25 ENCOUNTER — Telehealth: Payer: Self-pay | Admitting: *Deleted

## 2022-12-25 ENCOUNTER — Encounter: Payer: Self-pay | Admitting: Student

## 2022-12-25 ENCOUNTER — Ambulatory Visit: Payer: Medicare Other | Attending: Student | Admitting: Student

## 2022-12-25 VITALS — BP 167/72 | HR 87 | Ht 59.0 in | Wt 172.0 lb

## 2022-12-25 DIAGNOSIS — Z951 Presence of aortocoronary bypass graft: Secondary | ICD-10-CM | POA: Diagnosis not present

## 2022-12-25 DIAGNOSIS — I495 Sick sinus syndrome: Secondary | ICD-10-CM | POA: Diagnosis not present

## 2022-12-25 DIAGNOSIS — I25708 Atherosclerosis of coronary artery bypass graft(s), unspecified, with other forms of angina pectoris: Secondary | ICD-10-CM | POA: Diagnosis not present

## 2022-12-25 DIAGNOSIS — I10 Essential (primary) hypertension: Secondary | ICD-10-CM

## 2022-12-25 DIAGNOSIS — Z79899 Other long term (current) drug therapy: Secondary | ICD-10-CM

## 2022-12-25 LAB — CUP PACEART INCLINIC DEVICE CHECK
Battery Remaining Longevity: 124 mo
Battery Voltage: 3.02 V
Brady Statistic AP VP Percent: 0.07 %
Brady Statistic AP VS Percent: 92.33 %
Brady Statistic AS VP Percent: 0.02 %
Brady Statistic AS VS Percent: 7.58 %
Brady Statistic RA Percent Paced: 92.42 %
Brady Statistic RV Percent Paced: 0.09 %
Date Time Interrogation Session: 20241209103330
Implantable Lead Connection Status: 753985
Implantable Lead Connection Status: 753985
Implantable Lead Implant Date: 20210806
Implantable Lead Implant Date: 20210806
Implantable Lead Location: 753859
Implantable Lead Location: 753860
Implantable Lead Model: 5076
Implantable Lead Model: 5076
Implantable Pulse Generator Implant Date: 20210806
Lead Channel Impedance Value: 323 Ohm
Lead Channel Impedance Value: 418 Ohm
Lead Channel Impedance Value: 456 Ohm
Lead Channel Impedance Value: 532 Ohm
Lead Channel Pacing Threshold Amplitude: 0.5 V
Lead Channel Pacing Threshold Amplitude: 1 V
Lead Channel Pacing Threshold Pulse Width: 0.4 ms
Lead Channel Pacing Threshold Pulse Width: 0.4 ms
Lead Channel Sensing Intrinsic Amplitude: 10.25 mV
Lead Channel Sensing Intrinsic Amplitude: 12.75 mV
Lead Channel Sensing Intrinsic Amplitude: 2 mV
Lead Channel Sensing Intrinsic Amplitude: 2.875 mV
Lead Channel Setting Pacing Amplitude: 1.5 V
Lead Channel Setting Pacing Amplitude: 2 V
Lead Channel Setting Pacing Pulse Width: 0.4 ms
Lead Channel Setting Sensing Sensitivity: 1.2 mV
Zone Setting Status: 755011

## 2022-12-25 MED ORDER — AMLODIPINE BESYLATE 5 MG PO TABS: 5.0000 mg | ORAL_TABLET | Freq: Every day | ORAL | 3 refills | Status: AC

## 2022-12-25 NOTE — Telephone Encounter (Signed)
Left detailed msg, okay per dpr, letting pt know that Mardelle Matte wanted to add labs onto today's visit. Orders have been placed and left on msg that pt can go to any [facility] or come back to our buliding and go to the [facility] on the 1st floor.

## 2022-12-25 NOTE — Addendum Note (Signed)
Addended by: Valrie Hart on: 12/25/2022 10:47 AM   Modules accepted: Orders

## 2022-12-25 NOTE — Patient Instructions (Signed)
Medication Instructions:  Increase amlodipine to 5 mg daily *If you need a refill on your cardiac medications before your next appointment, please call your pharmacy*  Lab Work: None ordered If you have labs (blood work) drawn today and your tests are completely normal, you will receive your results only by: MyChart Message (if you have MyChart) OR A paper copy in the mail If you have any lab test that is abnormal or we need to change your treatment, we will call you to review the results.  Follow-Up: At [facility], you and your health needs are our priority.  As part of our continuing mission to provide you with exceptional heart care, we have created designated Provider Care Teams.  These Care Teams include your primary Cardiologist (physician) and Advanced Practice Providers (APPs -  Physician Assistants and Nurse Practitioners) who all work together to provide you with the care you need, when you need it.  We recommend signing up for the patient portal called "MyChart".  Sign up information is provided on this After Visit Summary.  MyChart is used to connect with patients for Virtual Visits (Telemedicine).  Patients are able to view lab/test results, encounter notes, upcoming appointments, etc.  Non-urgent messages can be sent to your provider as well.   To learn more about what you can do with MyChart, go to [email or web address].    Your next appointment:   1 year(s)  Provider:   Steffanie Dunn, MD

## 2023-02-16 ENCOUNTER — Ambulatory Visit: Payer: Medicare Other

## 2023-02-16 DIAGNOSIS — I495 Sick sinus syndrome: Secondary | ICD-10-CM

## 2023-02-19 ENCOUNTER — Ambulatory Visit: Payer: Medicare Other

## 2023-02-19 LAB — CUP PACEART REMOTE DEVICE CHECK
Battery Remaining Longevity: 121 mo
Battery Voltage: 3.02 V
Brady Statistic AP VP Percent: 0.09 %
Brady Statistic AP VS Percent: 93.95 %
Brady Statistic AS VP Percent: 0.02 %
Brady Statistic AS VS Percent: 5.94 %
Brady Statistic RA Percent Paced: 94.11 %
Brady Statistic RV Percent Paced: 0.11 %
Date Time Interrogation Session: 20250130210706
Implantable Lead Connection Status: 753985
Implantable Lead Connection Status: 753985
Implantable Lead Implant Date: 20210806
Implantable Lead Implant Date: 20210806
Implantable Lead Location: 753859
Implantable Lead Location: 753860
Implantable Lead Model: 5076
Implantable Lead Model: 5076
Implantable Pulse Generator Implant Date: 20210806
Lead Channel Impedance Value: 323 Ohm
Lead Channel Impedance Value: 380 Ohm
Lead Channel Impedance Value: 399 Ohm
Lead Channel Impedance Value: 494 Ohm
Lead Channel Pacing Threshold Amplitude: 0.375 V
Lead Channel Pacing Threshold Amplitude: 1 V
Lead Channel Pacing Threshold Pulse Width: 0.4 ms
Lead Channel Pacing Threshold Pulse Width: 0.4 ms
Lead Channel Sensing Intrinsic Amplitude: 10 mV
Lead Channel Sensing Intrinsic Amplitude: 10 mV
Lead Channel Sensing Intrinsic Amplitude: 2 mV
Lead Channel Sensing Intrinsic Amplitude: 2 mV
Lead Channel Setting Pacing Amplitude: 1.5 V
Lead Channel Setting Pacing Amplitude: 2 V
Lead Channel Setting Pacing Pulse Width: 0.4 ms
Lead Channel Setting Sensing Sensitivity: 1.2 mV
Zone Setting Status: 755011

## 2023-03-09 NOTE — Progress Notes (Signed)
 Cardiology Office Note:    Date:  03/13/2023   ID:  Heather [name], DOB 1930-12-27, MRN [id number]  PCP:  Kaleen Mask, MD  Cardiologist:  Norman Herrlich, MD    Referring MD: Kaleen Mask, *    ASSESSMENT:    1. PAF (paroxysmal atrial fibrillation) (HCC)   2. On amiodarone therapy   3. Sinus node dysfunction (HCC)   4. Pacemaker   5. Coronary artery disease of bypass graft of native heart with stable angina pectoris (HCC)   6. Hx of CABG   7. H/O mitral valve repair   8. Hypertensive heart disease with chronic diastolic congestive heart failure (HCC)   9. Stage 3a chronic kidney disease (HCC)   10. Mixed hyperlipidemia    PLAN:    In order of problems listed above:  She continues to do well maintaining sinus rhythm on low-dose amiodarone continue the same check CMP and thyroid studies for toxicity and continue her current anticoagulant Stable pacemaker function followed our device clinic she is atrially paced with intrinsic ventricular capture Stable CAD having no anginal discomfort following previous CABG and on current medical treatment including clopidogrel and Zetia Doing well after mitral valve repair no evidence of severe valve dysfunction on echocardiogram in the last year Blood pressure is well-controlled at target heart failure compensated continue her current loop diuretic and amlodipine Stable CKD Continue her nonstatin therapy and we will check a lipid profile today   Next appointment: I will plan to see her in 6 months   Medication Adjustments/Labs and Tests Ordered: Current medicines are reviewed at length with the patient today.  Concerns regarding medicines are outlined above.  Orders Placed This Encounter  Procedures   EKG 12-Lead   No orders of the defined types were placed in this encounter.    History of Present Illness:    Heather [name] is a 89 y.o. female with a hx of complex heart disease with CAD CABG and mitral valve  repair in 2003 subsequent inferior ST elevation MI 2015 with PCI and drug-eluting stent to the saphenous vein graft right coronary artery paroxysmal atrial fibrillation maintaining sinus rhythm with amiodarone bradycardia requiring permanent pacemaker hyperlipidemia statin intolerance and heart failure with preserved ejection fraction last seen 09/01/2022.  Her last labs October 2024 showed a CBC with a hemoglobin of 13.7 platelets 242,000 CMP with a sodium 139 potassium 4.5 creatinine 0.94 GFR 58 cc/min cholesterol 188 LDL 101 TSH normal 0.85  Compliance with diet, lifestyle and medications: Yes  She continues to have a good quality of life and has had no cardiovascular symptoms of edema shortness of breath orthopnea chest pain palpitation or syncope She does not check her blood pressure at home She has had no bleeding with her anticoagulant and clopidogrel She tolerates her nonstatin Zetia without muscle pain or weakness Past Medical History:  Diagnosis Date   Anxiety attack 01/03/2016   Aphasia 01/03/2016   Arthritis    CAD (coronary artery disease) of artery bypass graft, s/p PCI stent to VG->RCA 04/30/15 07/14/2013   Catheterization August/2003 showing severe three-vessel disease and moderate mitral regurgitation 08/28/2001 CABG with internal mammary graft to LAD, vein graft to circumflex, and vein graft to posterolateral branch and mitral valve repair by Dr. Cornelius Moras Inferior infarction 07/12/13 with occlusion of the vein graft to right coronary artery, stenting with a 3.5 x 18 mm resolute stent. Patent internal ma   CAD (coronary artery disease), native coronary artery 07/14/2013   Catheterization  August/2003 showing severe three-vessel disease and moderate mitral regurgitation 08/28/2001 CABG with internal mammary graft to LAD, vein graft to circumflex, and vein graft to posterolateral branch and mitral valve repair by Dr. Cornelius Moras Inferior infarction 07/12/13 with occlusion of the vein graft to right  coronary artery, stenting with a 3.518 mm resolute stents. Patent internal mammary graft with competitive flow in the distal vessel, occluded right coronary artery, occluded circumflex, patent saphenous vein graft to circumflex with mild/moderate disease proximally, minimal LV damage    Chest pain 08/20/2019   Chronic diastolic CHF (congestive heart failure) (HCC)    a. EF 60% by LV gram 01/2009.   Coronary artery disease    a. s/p MI in 1980;  b. 2003 CABG x 3 (LIMA->LAD, VG->OM, VG->RPL);  c. 01/2009 Cath: 3/3 patent grafts, native 3VD, EF 60%.   Diverticulitis    Dyspnea 01/03/2016   Elevated troponin    GERD (gastroesophageal reflux disease)    H/O mitral valve repair    a. 2003   History of kidney stones    Hyperlipidemia 07/14/2013   Intolerance to multiple statins including Crestor, pravastatin, and Lipitor    Hypertension    Hypertension, uncontrolled 07/19/2014   Hypertensive heart disease 07/14/2013   Hypothyroidism    NSTEMI (non-ST elevated myocardial infarction) (HCC)    Other hyperlipidemia    PAF (paroxysmal atrial fibrillation) (HCC)    Pneumonia    Presence of permanent cardiac pacemaker 08/22/2019   S/P partial hysterectomy 1977   Sinus node dysfunction (HCC)     Current Medications: Current Meds  Medication Sig   acetaminophen (TYLENOL) 325 MG tablet Take 1-2 tablets (325-650 mg total) by mouth every 6 (six) hours as needed for mild pain (pain score 1-3 or temp > 100.5).   albuterol (VENTOLIN HFA) 108 (90 Base) MCG/ACT inhaler Inhale 1-2 puffs into the lungs 4 (four) times daily as needed for shortness of breath or wheezing.   amiodarone (PACERONE) 200 MG tablet Take 1 tablet (200 mg total) by mouth daily.   amLODipine (NORVASC) 5 MG tablet Take 1 tablet (5 mg total) by mouth daily.   cholecalciferol (VITAMIN D3) 25 MCG (1000 UNIT) tablet Take 1,000 Units by mouth daily.   clopidogrel (PLAVIX) 75 MG tablet Take 1 tablet (75 mg total) by mouth daily.   famotidine  (PEPCID) 10 MG tablet Take 10 mg by mouth daily as needed for heartburn or indigestion.   furosemide (LASIX) 20 MG tablet Take 20 mg by mouth 2 (two) times daily.   guaiFENesin (MUCINEX) 600 MG 12 hr tablet Take 600 mg by mouth 2 (two) times daily as needed for to loosen phlegm.   levothyroxine (SYNTHROID, LEVOTHROID) 50 MCG tablet Take 50 mcg by mouth daily before breakfast.   nitroGLYCERIN (NITROSTAT) 0.4 MG SL tablet Place 0.4 mg under the tongue every 5 (five) minutes as needed for chest pain.      EKGs/Labs/Other Studies Reviewed:    The following studies were reviewed today:  Cardiac Studies & Procedures   ______________________________________________________________________________________________ CARDIAC CATHETERIZATION  CARDIAC CATHETERIZATION 01/06/2016  Narrative  LV end diastolic pressure is mildly elevated.  The left ventricular ejection fraction is 50-55% by visual estimate.  There is no aortic valve stenosis.  Mid RCA lesion, 100 %stenosed.  Dist RCA lesion, 100 %stenosed.  Prox Cx lesion, 100 %stenosed.  Mid LAD to Dist LAD lesion, 90 %stenosed.  Stents in SVG to RCA are widely patent.  SVG to OM is occluded.  LIMA to LAD  is patent.  No change from end result of cath in April 2017.  Widely patent stents in the SVG to RCA.  Lateral wall defect on nuclear is likely due to occluded circumflex and occluded SVG to OM.  She has faint collaterals to her lateral wall.  Continue medical therapy.  Findings Coronary Findings Diagnostic  Dominance: Right  Left Anterior Descending  First Diagonal Branch Vessel is small in size.  Second Diagonal Branch Vessel is small in size.  Left Circumflex  Second Obtuse Marginal Branch Collaterals 2nd Mrg filled by collaterals from 3rd RPL.  Right Coronary Artery  Single Graft Graft To RPDA and is normal in caliber.  Prox Graft lesion with no stenosis was previously treated. The lesion is eccentric. Mid  Graft-1 lesion with no stenosis was previously treated. Mid Graft-2 lesion with no stenosis was previously treated.  Single Graft Graft To 1st Mrg The graft exhibits diffuse disease.  LIMA LIMA Graft To Dist LAD LIMA and is normal in caliber.  Intervention  No interventions have been documented.   CARDIAC CATHETERIZATION  CARDIAC CATHETERIZATION 04/30/2015  Narrative 1. Mid RCA lesion, 100% stenosed. 2. Dist RCA lesion, 100% stenosed. 3. Prox Cx lesion, 100% stenosed. 4. Mid LAD to Dist LAD lesion, 90% stenosed. 5. SVG was injected is normal in caliber, and is anatomically normal. 6. SVG was injected . 7. There is severe diffuse disease in the graft. 8. Origin to Prox Graft lesion, 100% stenosed. 9. LIMA was injected is normal in caliber, and is anatomically normal. 10. Mid Graft-2 lesion, 99% stenosed. 11. Mid Graft-1 lesion, 50% stenosed. Post intervention, there is a 0% residual stenosis. The lesion was previously treated with a stent (unknown type). 12. Prox Graft lesion, 70% stenosed. Post intervention, there is a 0% residual stenosis. 13. There is mild to moderate left ventricular systolic dysfunction.   Bypass graft occlusion total occlusion of the SVG to the circumflex, and high-grade obstruction in the SVG to the RCA.  The SVG to the RCA contains 50% in-stent restenosis, de novo 99% stenosis beyond the previously placed stent, and proximal intimal flap obstructing the vessel by 70%.  Widely patent LIMA to LAD  Severe diffuse high-grade disease in the proximal to mid LAD. Total occlusion of the proximal circumflex. Total occlusion of the mid native right coronary.  Severe inferior basal hypokinesis. LVEF mildly reduced at 35-40%. Normal left ventricular end-diastolic pressure.  Successful multi-site stenting of the saphenous vein graft to the right coronary, with reduction in proximal stenosis from 70 to 0% and reduction of distal stenoses from 50 and 99% to 0%.  Distal protection was used with a 5.0 mm Spider device. Copious atheromatous material was retrieved in the basket.   Recommendations:   The patient is allergic to and refuses statin therapy. We need to consider PCSK-9 therapy.  Continue Plavix. Check P2 Y 12 assay.  Findings Coronary Findings Diagnostic  Dominance: Right  Left Anterior Descending  First Diagonal Branch The vessel is small in size.  Second Diagonal Branch The vessel is small in size.  Left Circumflex  Right Coronary Artery  Single Graft Graft To RPDA SVG was injected is normal in caliber, and is anatomically normal. Eccentric. The lesion was previously treated with a stent (unknown type).  Single Graft Graft To 1st Mrg SVG was injected . There is severe diffuse disease in the graft.  LIMA LIMA Graft To Dist LAD LIMA was injected is normal in caliber, and is anatomically normal.  Intervention  Prox Graft lesion (Single Graft Graft To RPDA) PCI The pre-interventional distal flow is normal (TIMI 3). No pre-stent angioplasty was performed. A drug-eluting stent was placed. The strut is apposed. No post-stent angioplasty was performed. The post-interventional distal flow is normal (TIMI 3). The intervention was successful. No complications occurred at this lesion. Spider 5.0 distal protection There is a 0% residual stenosis post intervention.  Mid Graft-1 lesion (Single Graft Graft To RPDA) PCI (Also treats lesions: Mid Graft-2) The pre-interventional distal flow is normal (TIMI 3). No pre-stent angioplasty was performed. A drug-eluting stent was placed. No post-stent angioplasty was performed. The post-interventional distal flow is normal (TIMI 3). The intervention was successful. No complications occurred at this lesion. There is a 0% residual stenosis post intervention.  Mid Graft-2 lesion (Single Graft Graft To RPDA) PCI (Also treats lesions: Mid Graft-1) See details in Mid Graft-1 lesion (Single Graft  Graft To RPDA). There is a 0% residual stenosis post intervention.   STRESS TESTS  PCV WALKING MYOCARDIAL PERFUSION WITH LEXISCAN 02/02/2009   ECHOCARDIOGRAM  ECHOCARDIOGRAM COMPLETE 11/16/2021  Narrative ECHOCARDIOGRAM REPORT    Patient Name:   ZAYLEY ARRAS Date of Exam: 11/16/2021 Medical Rec #:  [id number]        Height:       59.0 in Accession #:    [id number]       Weight:       178.0 lb Date of Birth:  1930-09-30         BSA:          1.755 m Patient Age:    89 years         BP:           168/78 mmHg Patient Gender: F                HR:           71 bpm. Exam Location:  [place]  Procedure: 2D Echo, Cardiac Doppler, Color Doppler and Strain Analysis  Indications:    PAF (paroxysmal atrial fibrillation) (HCC) [I48.0 (ICD-10-CM)];  History:        Patient has prior history of Echocardiogram examinations, most recent 11/15/2020. Pacemaker, CKD (chronic kidney disease) stage 4; Signs/Symptoms:Hypertensive Heart Disease.  Sonographer:    Louie Boston RDCS Referring Phys: [id number] Wynn Kernes J Magan [place]  IMPRESSIONS   1. Left ventricular ejection fraction, by estimation, is 55 to 60%. The left ventricle has normal function. The left ventricle has no regional wall motion abnormalities. Left ventricular diastolic parameters are indeterminate. 2. Right ventricular systolic function is moderately reduced. The right ventricular size is normal. There is normal pulmonary artery systolic pressure. 3. The mitral valve is degenerative. Mild to moderate mitral valve regurgitation. No evidence of mitral stenosis. 4. The aortic valve is tricuspid. Aortic valve regurgitation is not visualized. Aortic valve sclerosis is present, with no evidence of aortic valve stenosis. 5. Aortic Normal DTA. 6. The inferior vena cava is normal in size with greater than 50% respiratory variability, suggesting right atrial pressure of 3 mmHg.  FINDINGS Left Ventricle: Left ventricular ejection fraction, by  estimation, is 55 to 60%. The left ventricle has normal function. The left ventricle has no regional wall motion abnormalities. Global longitudinal strain performed but not reported based on interpreter judgement due to suboptimal tracking. The left ventricular internal cavity size was normal in size. There is no left ventricular hypertrophy. Left ventricular diastolic parameters are indeterminate. Indeterminate filling pressures.  Right Ventricle: The right ventricular size is  normal. No increase in right ventricular wall thickness. Right ventricular systolic function is moderately reduced. There is normal pulmonary artery systolic pressure. The tricuspid regurgitant velocity is 2.81 m/s, and with an assumed right atrial pressure of 3 mmHg, the estimated right ventricular systolic pressure is 34.6 mmHg.  Left Atrium: Left atrial size was normal in size.  Right Atrium: Right atrial size was normal in size.  Pericardium: There is no evidence of pericardial effusion.  Mitral Valve: The mitral valve is degenerative in appearance. Mild mitral annular calcification. Mild to moderate mitral valve regurgitation. No evidence of mitral valve stenosis. MV peak gradient, 10.7 mmHg. The mean mitral valve gradient is 4.0 mmHg.  Tricuspid Valve: The tricuspid valve is normal in structure. Tricuspid valve regurgitation is mild . No evidence of tricuspid stenosis.  Aortic Valve: The aortic valve is tricuspid. Aortic valve regurgitation is not visualized. Aortic valve sclerosis is present, with no evidence of aortic valve stenosis.  Pulmonic Valve: The pulmonic valve was normal in structure. Pulmonic valve regurgitation is not visualized. No evidence of pulmonic stenosis.  Aorta: Normal DTA, the aortic arch was not well visualized and the aortic root and ascending aorta are structurally normal, with no evidence of dilitation.  Venous: A systolic blunting flow pattern is recorded from the right upper pulmonary  vein. The inferior vena cava is normal in size with greater than 50% respiratory variability, suggesting right atrial pressure of 3 mmHg.  IAS/Shunts: No atrial level shunt detected by color flow Doppler.  Additional Comments: A device lead is visualized in the right ventricle.   LEFT VENTRICLE PLAX 2D LVIDd:         4.80 cm   Diastology LVIDs:         4.20 cm   LV e' medial:    3.45 cm/s LV PW:         1.20 cm   LV E/e' medial:  44.1 LV IVS:        1.35 cm   LV e' lateral:   6.75 cm/s LVOT diam:     2.00 cm   LV E/e' lateral: 22.5 LV SV:         54 LV SV Index:   31 LVOT Area:     3.14 cm   RIGHT VENTRICLE            IVC RV S prime:     6.75 cm/s  IVC diam: 1.60 cm TAPSE (M-mode): 0.9 cm  LEFT ATRIUM             Index        RIGHT ATRIUM           Index LA diam:        4.70 cm 2.68 cm/m   RA Area:     18.30 cm LA Vol (A2C):   49.7 ml 28.32 ml/m  RA Volume:   43.10 ml  24.56 ml/m LA Vol (A4C):   51.8 ml 29.51 ml/m LA Biplane Vol: 50.6 ml 28.83 ml/m AORTIC VALVE LVOT Vmax:   70.50 cm/s LVOT Vmean:  48.200 cm/s LVOT VTI:    0.172 m  AORTA Ao Root diam: 2.95 cm Ao Asc diam:  3.40 cm Ao Desc diam: 2.20 cm  MITRAL VALVE                TRICUSPID VALVE MV Area (PHT): 3.11 cm     TR Peak grad:   31.6 mmHg MV Area VTI:   1.18 cm  TR Vmax:        281.00 cm/s MV Peak grad:  10.7 mmHg MV Mean grad:  4.0 mmHg     SHUNTS MV Vmax:       1.64 m/s     Systemic VTI:  0.17 m MV Vmean:      91.0 cm/s    Systemic Diam: 2.00 cm MV Decel Time: 244 msec MV E velocity: 152.00 cm/s MV A velocity: 67.70 cm/s MV E/A ratio:  2.25  Norman Herrlich MD Electronically signed by Norman Herrlich MD Signature Date/Time: 11/16/2021/12:37:54 PM    Final          ______________________________________________________________________________________________      EKG Interpretation Date/Time:  Tuesday March 13 2023 09:05:35 EST Ventricular Rate:  75 PR Interval:  336 QRS  Duration:  150 QT Interval:  458 QTC Calculation: 511 R Axis:   108  Text Interpretation: Atrial-paced rhythm with prolonged AV conduction Right bundle branch block When compared with ECG of 01-Sep-2022 09:29, Uchanged Confirmed by Norman Herrlich ([id number]) on 03/13/2023 9:17:33 AM   Recent Labs: No results found for requested labs within last 365 days.  Recent Lipid Panel    Component Value Date/Time   CHOL 180 04/30/2015 0548   TRIG 249 (H) 04/30/2015 0548   HDL 27 (L) 04/30/2015 0548   CHOLHDL 6.7 04/30/2015 0548   VLDL 50 (H) 04/30/2015 0548   LDLCALC 103 (H) 04/30/2015 0548    Physical Exam:    VS:  BP (!) 150/68   Pulse 75   Ht 4\' 11"  (1.499 m)   Wt 174 lb 3.2 oz (79 kg)   SpO2 99%   BMI 35.18 kg/m     Wt Readings from Last 3 Encounters:  03/13/23 174 lb 3.2 oz (79 kg)  12/25/22 172 lb (78 kg)  09/01/22 174 lb 12.8 oz (79.3 kg)     GEN: She appears her age uses a walker for safety well nourished, well developed in no acute distress HEENT: Normal NECK: No JVD; No carotid bruits LYMPHATICS: No lymphadenopathy CARDIAC: RRR grade 1/6 murmur aortic stenosis RESPIRATORY:  Clear to auscultation without rales, wheezing or rhonchi  ABDOMEN: Soft, non-tender, non-distended MUSCULOSKELETAL:  No edema; No deformity  SKIN: Warm and dry NEUROLOGIC:  Alert and oriented x 3 PSYCHIATRIC:  Normal affect    Signed, Norman Herrlich, MD  03/13/2023 9:32 AM    [facility]

## 2023-03-13 ENCOUNTER — Encounter: Payer: Self-pay | Admitting: Cardiology

## 2023-03-13 ENCOUNTER — Ambulatory Visit: Payer: Medicare Other | Attending: Cardiology | Admitting: Cardiology

## 2023-03-13 ENCOUNTER — Ambulatory Visit: Payer: Medicare Other | Admitting: Cardiology

## 2023-03-13 VITALS — BP 136/60 | HR 75 | Ht 59.0 in | Wt 174.2 lb

## 2023-03-13 DIAGNOSIS — I480 Paroxysmal atrial fibrillation: Secondary | ICD-10-CM | POA: Diagnosis not present

## 2023-03-13 DIAGNOSIS — Z79899 Other long term (current) drug therapy: Secondary | ICD-10-CM | POA: Diagnosis not present

## 2023-03-13 DIAGNOSIS — I5032 Chronic diastolic (congestive) heart failure: Secondary | ICD-10-CM

## 2023-03-13 DIAGNOSIS — N1831 Chronic kidney disease, stage 3a: Secondary | ICD-10-CM

## 2023-03-13 DIAGNOSIS — Z951 Presence of aortocoronary bypass graft: Secondary | ICD-10-CM

## 2023-03-13 DIAGNOSIS — I495 Sick sinus syndrome: Secondary | ICD-10-CM | POA: Diagnosis not present

## 2023-03-13 DIAGNOSIS — Z9889 Other specified postprocedural states: Secondary | ICD-10-CM

## 2023-03-13 DIAGNOSIS — Z950 Presence of cardiac pacemaker: Secondary | ICD-10-CM | POA: Diagnosis not present

## 2023-03-13 DIAGNOSIS — I110 Hypertensive heart disease with heart failure: Secondary | ICD-10-CM

## 2023-03-13 DIAGNOSIS — E782 Mixed hyperlipidemia: Secondary | ICD-10-CM

## 2023-03-13 DIAGNOSIS — I25708 Atherosclerosis of coronary artery bypass graft(s), unspecified, with other forms of angina pectoris: Secondary | ICD-10-CM

## 2023-03-13 NOTE — Patient Instructions (Signed)
 Medication Instructions:  Your physician recommends that you continue on your current medications as directed. Please refer to the Current Medication list given to you today.  *If you need a refill on your cardiac medications before your next appointment, please call your pharmacy*   Lab Work: Your physician recommends that you return for lab work in:   Labs today: TSH T3 T4, CMP, Lipids   If you have labs (blood work) drawn today and your tests are completely normal, you will receive your results only by: MyChart Message (if you have MyChart) OR A paper copy in the mail If you have any lab test that is abnormal or we need to change your treatment, we will call you to review the results.   Testing/Procedures: None   Follow-Up: At [facility], you and your health needs are our priority.  As part of our continuing mission to provide you with exceptional heart care, we have created designated Provider Care Teams.  These Care Teams include your primary Cardiologist (physician) and Advanced Practice Providers (APPs -  Physician Assistants and Nurse Practitioners) who all work together to provide you with the care you need, when you need it.  We recommend signing up for the patient portal called "MyChart".  Sign up information is provided on this After Visit Summary.  MyChart is used to connect with patients for Virtual Visits (Telemedicine).  Patients are able to view lab/test results, encounter notes, upcoming appointments, etc.  Non-urgent messages can be sent to your provider as well.   To learn more about what you can do with MyChart, go to [email or web address].    Your next appointment:   6 month(s)  Provider:   Norman Herrlich, MD    Other Instructions None

## 2023-03-14 LAB — TSH+T4F+T3FREE
Free T4: 1.6 ng/dL (ref 0.82–1.77)
T3, Free: 2.3 pg/mL (ref 2.0–4.4)
TSH: 0.895 u[IU]/mL (ref 0.450–4.500)

## 2023-03-14 LAB — COMPREHENSIVE METABOLIC PANEL
ALT: 19 [IU]/L (ref 0–32)
AST: 26 [IU]/L (ref 0–40)
Albumin: 4.6 g/dL (ref 3.6–4.6)
Alkaline Phosphatase: 89 [IU]/L (ref 44–121)
BUN/Creatinine Ratio: 14 (ref 12–28)
BUN: 12 mg/dL (ref 10–36)
Bilirubin Total: 1 mg/dL (ref 0.0–1.2)
CO2: 24 mmol/L (ref 20–29)
Calcium: 9.6 mg/dL (ref 8.7–10.3)
Chloride: 99 mmol/L (ref 96–106)
Creatinine, Ser: 0.88 mg/dL (ref 0.57–1.00)
Globulin, Total: 2 g/dL (ref 1.5–4.5)
Glucose: 88 mg/dL (ref 70–99)
Potassium: 4 mmol/L (ref 3.5–5.2)
Sodium: 143 mmol/L (ref 134–144)
Total Protein: 6.6 g/dL (ref 6.0–8.5)
eGFR: 62 mL/min/{1.73_m2} (ref >59)

## 2023-03-14 LAB — LIPID PANEL
Chol/HDL Ratio: 3.5 {ratio} (ref 0.0–4.4)
Cholesterol, Total: 165 mg/dL (ref 100–199)
HDL: 47 mg/dL (ref >39)
LDL Chol Calc (NIH): 87 mg/dL (ref 0–99)
Triglycerides: 179 mg/dL — ABNORMAL HIGH (ref 0–149)
VLDL Cholesterol Cal: 31 mg/dL (ref 5–40)

## 2023-03-23 NOTE — Progress Notes (Signed)
 Remote pacemaker transmission.

## 2023-03-23 NOTE — Addendum Note (Signed)
 Addended by: Elease Etienne A on: 03/23/2023 12:07 PM   Modules accepted: Orders

## 2023-04-11 ENCOUNTER — Ambulatory Visit: Admitting: Orthopaedic Surgery

## 2023-04-11 ENCOUNTER — Other Ambulatory Visit (INDEPENDENT_AMBULATORY_CARE_PROVIDER_SITE_OTHER)

## 2023-04-11 DIAGNOSIS — M25551 Pain in right hip: Secondary | ICD-10-CM | POA: Diagnosis not present

## 2023-04-11 DIAGNOSIS — Z96642 Presence of left artificial hip joint: Secondary | ICD-10-CM

## 2023-04-11 MED ORDER — LIDOCAINE HCL 1 % IJ SOLN
3.0000 mL | INTRAMUSCULAR | Status: AC | PRN
  Administered 2023-04-11: 3 mL

## 2023-04-11 MED ORDER — METHYLPREDNISOLONE ACETATE 40 MG/ML IJ SUSP
40.0000 mg | INTRAMUSCULAR | Status: AC | PRN
  Administered 2023-04-11: 40 mg via INTRA_ARTICULAR

## 2023-04-11 NOTE — Progress Notes (Signed)
 The patient is a 89 year old female we have seen before.  We actually replaced her left hip back in 2022.  About a month ago she was having pain in her right hip and she said she cannot walk well with pain with weightbearing in the hip "locking up on her".  We know from previous x-rays that she has an incredibly arthritic lumbar spine.  She is on blood thinning medications which is Eliquis so she cannot take anti-inflammatories.  She does ambit with a rolling walker.  On exam today her right hip and left hip actually move smoothly and fluidly with no blocks to rotation.  There is pain over the tip of the trochanteric area on the right side.  An AP pelvis shows a well-seated left total hip arthroplasty with no complicating features.  The right hip joint space is well-maintained.  There are some slight cortical irregularities around the tip of the trochanteric area suggesting chronic tearing of the abductor tendons.  Given the point of maximum tenderness of her right hip I suggested a trochanteric steroid injection around this area to see if this will help her.  She agreed to the injection and did tolerate it well.  Follow-up can be as needed.    Procedure Note  Patient: Heather [name]             Date of Birth: [birth year]           MRN: [id number]             Visit Date: 04/11/2023  Procedures: Visit Diagnoses:  1. History of left hip replacement   2. Pain of right hip     Large Joint Inj: R greater trochanter on 04/11/2023 8:41 AM Indications: pain and diagnostic evaluation Details: 22 G 1.5 in needle, lateral approach  Arthrogram: No  Medications: 3 mL lidocaine 1 %; 40 mg methylPREDNISolone acetate 40 MG/ML Outcome: tolerated well, no immediate complications Procedure, treatment alternatives, risks and benefits explained, specific risks discussed. Consent was given by the patient. Immediately prior to procedure a time out was called to verify the correct patient, procedure,  equipment, support staff and site/side marked as required. Patient was prepped and draped in the usual sterile fashion.

## 2023-05-18 ENCOUNTER — Ambulatory Visit (INDEPENDENT_AMBULATORY_CARE_PROVIDER_SITE_OTHER): Payer: Medicare Other

## 2023-05-18 DIAGNOSIS — I495 Sick sinus syndrome: Secondary | ICD-10-CM | POA: Diagnosis not present

## 2023-05-18 LAB — CUP PACEART REMOTE DEVICE CHECK
Battery Remaining Longevity: 118 mo
Battery Voltage: 3.02 V
Brady Statistic AP VP Percent: 0.39 %
Brady Statistic AP VS Percent: 93.24 %
Brady Statistic AS VP Percent: 0.02 %
Brady Statistic AS VS Percent: 6.35 %
Brady Statistic RA Percent Paced: 93.55 %
Brady Statistic RV Percent Paced: 0.41 %
Date Time Interrogation Session: 20250502094630
Implantable Lead Connection Status: 753985
Implantable Lead Connection Status: 753985
Implantable Lead Implant Date: 20210806
Implantable Lead Implant Date: 20210806
Implantable Lead Location: 753859
Implantable Lead Location: 753860
Implantable Lead Model: 5076
Implantable Lead Model: 5076
Implantable Pulse Generator Implant Date: 20210806
Lead Channel Impedance Value: 323 Ohm
Lead Channel Impedance Value: 380 Ohm
Lead Channel Impedance Value: 418 Ohm
Lead Channel Impedance Value: 475 Ohm
Lead Channel Pacing Threshold Amplitude: 0.5 V
Lead Channel Pacing Threshold Amplitude: 1 V
Lead Channel Pacing Threshold Pulse Width: 0.4 ms
Lead Channel Pacing Threshold Pulse Width: 0.4 ms
Lead Channel Sensing Intrinsic Amplitude: 10.625 mV
Lead Channel Sensing Intrinsic Amplitude: 10.625 mV
Lead Channel Sensing Intrinsic Amplitude: 2.25 mV
Lead Channel Sensing Intrinsic Amplitude: 2.25 mV
Lead Channel Setting Pacing Amplitude: 1.5 V
Lead Channel Setting Pacing Amplitude: 2 V
Lead Channel Setting Pacing Pulse Width: 0.4 ms
Lead Channel Setting Sensing Sensitivity: 1.2 mV
Zone Setting Status: 755011

## 2023-05-21 ENCOUNTER — Ambulatory Visit: Payer: Medicare Other

## 2023-06-27 NOTE — Progress Notes (Signed)
 Remote pacemaker transmission.

## 2023-08-17 ENCOUNTER — Ambulatory Visit (INDEPENDENT_AMBULATORY_CARE_PROVIDER_SITE_OTHER): Payer: Medicare Other

## 2023-08-17 DIAGNOSIS — I495 Sick sinus syndrome: Secondary | ICD-10-CM | POA: Diagnosis not present

## 2023-08-17 LAB — CUP PACEART REMOTE DEVICE CHECK
Battery Remaining Longevity: 114 mo
Battery Voltage: 3.02 V
Brady Statistic AP VP Percent: 0.07 %
Brady Statistic AP VS Percent: 96.35 %
Brady Statistic AS VP Percent: 0.01 %
Brady Statistic AS VS Percent: 3.57 %
Brady Statistic RA Percent Paced: 96.43 %
Brady Statistic RV Percent Paced: 0.09 %
Date Time Interrogation Session: 20250731194340
Implantable Lead Connection Status: 753985
Implantable Lead Connection Status: 753985
Implantable Lead Implant Date: 20210806
Implantable Lead Implant Date: 20210806
Implantable Lead Location: 753859
Implantable Lead Location: 753860
Implantable Lead Model: 5076
Implantable Lead Model: 5076
Implantable Pulse Generator Implant Date: 20210806
Lead Channel Impedance Value: 304 Ohm
Lead Channel Impedance Value: 380 Ohm
Lead Channel Impedance Value: 437 Ohm
Lead Channel Impedance Value: 513 Ohm
Lead Channel Pacing Threshold Amplitude: 0.375 V
Lead Channel Pacing Threshold Amplitude: 1 V
Lead Channel Pacing Threshold Pulse Width: 0.4 ms
Lead Channel Pacing Threshold Pulse Width: 0.4 ms
Lead Channel Sensing Intrinsic Amplitude: 14.875 mV
Lead Channel Sensing Intrinsic Amplitude: 14.875 mV
Lead Channel Sensing Intrinsic Amplitude: 2.5 mV
Lead Channel Sensing Intrinsic Amplitude: 2.5 mV
Lead Channel Setting Pacing Amplitude: 1.5 V
Lead Channel Setting Pacing Amplitude: 2 V
Lead Channel Setting Pacing Pulse Width: 0.4 ms
Lead Channel Setting Sensing Sensitivity: 1.2 mV
Zone Setting Status: 755011

## 2023-08-20 ENCOUNTER — Ambulatory Visit: Payer: Self-pay | Admitting: Cardiology

## 2023-10-10 NOTE — Progress Notes (Signed)
 Remote PPM Transmission

## 2023-11-16 ENCOUNTER — Ambulatory Visit (INDEPENDENT_AMBULATORY_CARE_PROVIDER_SITE_OTHER): Payer: Medicare Other

## 2023-11-16 DIAGNOSIS — I495 Sick sinus syndrome: Secondary | ICD-10-CM | POA: Diagnosis not present

## 2023-11-16 LAB — CUP PACEART REMOTE DEVICE CHECK
Battery Remaining Longevity: 112 mo
Battery Voltage: 3.01 V
Brady Statistic AP VP Percent: 0.07 %
Brady Statistic AP VS Percent: 94.33 %
Brady Statistic AS VP Percent: 0.02 %
Brady Statistic AS VS Percent: 5.59 %
Brady Statistic RA Percent Paced: 94.41 %
Brady Statistic RV Percent Paced: 0.09 %
Date Time Interrogation Session: 20251031032123
Implantable Lead Connection Status: 753985
Implantable Lead Connection Status: 753985
Implantable Lead Implant Date: 20210806
Implantable Lead Implant Date: 20210806
Implantable Lead Location: 753859
Implantable Lead Location: 753860
Implantable Lead Model: 5076
Implantable Lead Model: 5076
Implantable Pulse Generator Implant Date: 20210806
Lead Channel Impedance Value: 323 Ohm
Lead Channel Impedance Value: 380 Ohm
Lead Channel Impedance Value: 456 Ohm
Lead Channel Impedance Value: 551 Ohm
Lead Channel Pacing Threshold Amplitude: 0.5 V
Lead Channel Pacing Threshold Amplitude: 0.875 V
Lead Channel Pacing Threshold Pulse Width: 0.4 ms
Lead Channel Pacing Threshold Pulse Width: 0.4 ms
Lead Channel Sensing Intrinsic Amplitude: 14.625 mV
Lead Channel Sensing Intrinsic Amplitude: 14.625 mV
Lead Channel Sensing Intrinsic Amplitude: 2.25 mV
Lead Channel Sensing Intrinsic Amplitude: 2.25 mV
Lead Channel Setting Pacing Amplitude: 1.5 V
Lead Channel Setting Pacing Amplitude: 2 V
Lead Channel Setting Pacing Pulse Width: 0.4 ms
Lead Channel Setting Sensing Sensitivity: 1.2 mV
Zone Setting Status: 755011

## 2023-11-20 ENCOUNTER — Ambulatory Visit: Payer: Self-pay | Admitting: Cardiology

## 2023-11-21 NOTE — Progress Notes (Signed)
 Remote PPM Transmission

## 2023-12-21 ENCOUNTER — Other Ambulatory Visit: Payer: Self-pay

## 2023-12-21 MED ORDER — AMIODARONE HCL 200 MG PO TABS: 200.0000 mg | ORAL_TABLET | Freq: Every day | ORAL | 0 refills | Status: AC

## 2024-01-15 NOTE — Progress Notes (Unsigned)
" °  Electrophysiology Office Note:   ID:  Heather [name], DOB [birth year], MRN [id number]  Primary Cardiologist: Redell Leiter, MD Electrophysiologist: Will Gladis Norton, MD      History of Present Illness:   Heather [name] is a 89 y.o. female with h/o CAD s/p CABG, PAF, HTN, and SND s/p PPM seen today for routine electrophysiology followup.   Since last being seen in our clinic the patient reports doing very well from a cardiac perspective. Overall, she denies chest pain, palpitations, dyspnea, PND, orthopnea, nausea, vomiting, dizziness, syncope, edema, weight gain, or early satiety.   Review of systems complete and found to be negative unless listed in HPI.   EP Information / Studies Reviewed:    EKG is ordered today. Personal review as below.  EKG Interpretation Date/Time:  Wednesday January 16 2024 09:33:51 EST Ventricular Rate:  76 PR Interval:    QRS Duration:  166 QT Interval:  462 QTC Calculation: 519 R Axis:   96  Text Interpretation: Atrial-paced rhythm Right bundle branch block Confirmed by Lesia Sharper [phone]) on 01/16/2024 9:37:44 AM    PPM Interrogation-  reviewed in detail today,  See PACEART report.  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   Physical Exam:   VS:  BP (!) 150/64   Pulse 77   Ht 4' 11 (1.499 m)   Wt 171 lb (77.6 kg)   SpO2 97%   BMI 34.54 kg/m    Wt Readings from Last 3 Encounters:  01/16/24 171 lb (77.6 kg)  03/13/23 174 lb 3.2 oz (79 kg)  12/25/22 172 lb (78 kg)     GEN: No acute distress  NECK: No JVD; No carotid bruits CARDIAC: Regular rate and rhythm, no murmurs, rubs, gallops RESPIRATORY:  Clear to auscultation without rales, wheezing or rhonchi  ABDOMEN: Soft, non-tender, non-distended EXTREMITIES:  No edema; No deformity   ASSESSMENT AND PLAN:    SND s/p Medtronic PPM  Normal PPM function See Pace Art report No changes today  HTN Stable on current regimen    Paroxysmal AF EKG today shows NSR with A pacing Continue amiodarone  Surveillance labs requested from PCP. Pt states he manages her thyroid  so would've checked CMET and TSH. She understands she may require additional labs if either not included.    CAD No s/s of ischemia.      Disposition:   Follow up with EP Team in 12 months - Alternating 6 month visits with Gen Cards  Signed, Sharper Prentice Lesia, PA-C  "

## 2024-01-16 ENCOUNTER — Ambulatory Visit: Attending: Internal Medicine | Admitting: Student

## 2024-01-16 ENCOUNTER — Encounter: Payer: Self-pay | Admitting: Student

## 2024-01-16 VITALS — BP 150/64 | HR 77 | Ht 59.0 in | Wt 171.0 lb

## 2024-01-16 DIAGNOSIS — I495 Sick sinus syndrome: Secondary | ICD-10-CM

## 2024-01-16 DIAGNOSIS — Z79899 Other long term (current) drug therapy: Secondary | ICD-10-CM

## 2024-01-16 DIAGNOSIS — I480 Paroxysmal atrial fibrillation: Secondary | ICD-10-CM

## 2024-01-16 DIAGNOSIS — I25708 Atherosclerosis of coronary artery bypass graft(s), unspecified, with other forms of angina pectoris: Secondary | ICD-10-CM

## 2024-01-16 DIAGNOSIS — Z951 Presence of aortocoronary bypass graft: Secondary | ICD-10-CM | POA: Diagnosis not present

## 2024-01-16 DIAGNOSIS — Z9189 Other specified personal risk factors, not elsewhere classified: Secondary | ICD-10-CM | POA: Diagnosis not present

## 2024-01-16 LAB — CUP PACEART INCLINIC DEVICE CHECK
Battery Remaining Longevity: 111 mo
Battery Voltage: 3.01 V
Brady Statistic AP VP Percent: 0.18 %
Brady Statistic AP VS Percent: 94.41 %
Brady Statistic AS VP Percent: 0.02 %
Brady Statistic AS VS Percent: 5.39 %
Brady Statistic RA Percent Paced: 94.59 %
Brady Statistic RV Percent Paced: 0.2 %
Date Time Interrogation Session: 20251231095255
Implantable Lead Connection Status: 753985
Implantable Lead Connection Status: 753985
Implantable Lead Implant Date: 20210806
Implantable Lead Implant Date: 20210806
Implantable Lead Location: 753859
Implantable Lead Location: 753860
Implantable Lead Model: 5076
Implantable Lead Model: 5076
Implantable Pulse Generator Implant Date: 20210806
Lead Channel Impedance Value: 323 Ohm
Lead Channel Impedance Value: 418 Ohm
Lead Channel Impedance Value: 475 Ohm
Lead Channel Impedance Value: 589 Ohm
Lead Channel Pacing Threshold Amplitude: 0.5 V
Lead Channel Pacing Threshold Amplitude: 1 V
Lead Channel Pacing Threshold Pulse Width: 0.4 ms
Lead Channel Pacing Threshold Pulse Width: 0.4 ms
Lead Channel Sensing Intrinsic Amplitude: 12.625 mV
Lead Channel Sensing Intrinsic Amplitude: 13.375 mV
Lead Channel Sensing Intrinsic Amplitude: 2.75 mV
Lead Channel Sensing Intrinsic Amplitude: 3.375 mV
Lead Channel Setting Pacing Amplitude: 1.5 V
Lead Channel Setting Pacing Amplitude: 2 V
Lead Channel Setting Pacing Pulse Width: 0.4 ms
Lead Channel Setting Sensing Sensitivity: 1.2 mV
Zone Setting Status: 755011

## 2024-01-16 NOTE — Patient Instructions (Addendum)
 Medication Instructions:  No medication changes today. *If you need a refill on your cardiac medications before your next appointment, please call your pharmacy*  Lab Work: None today - Requested copies from Dr. Loring If you have labs (blood work) drawn today and your tests are completely normal, you will receive your results only by: MyChart Message (if you have MyChart) OR A paper copy in the mail If you have any lab test that is abnormal or we need to change your treatment, we will call you to review the results.  Testing/Procedures: No testing ordered today  Follow-Up: At [facility], you and your health needs are our priority.  As part of our continuing mission to provide you with exceptional heart care, our providers are all part of one team.  This team includes your primary Cardiologist (physician) and Advanced Practice Providers or APPs (Physician Assistants and Nurse Practitioners) who all work together to provide you with the care you need, when you need it.  Your next appointment:   12 month(s)  See Dr. Monetta in Summer  Provider:   You may see Will Gladis Norton, MD or one of the following Advanced Practice Providers on your designated Care Team:   Charlies Arthur, PA-C Ulyana Pitones Andy Alura Olveda, PA-C Suzann Riddle, NP Daphne Barrack, NP    We recommend signing up for the patient portal called MyChart.  Sign up information is provided on this After Visit Summary.  MyChart is used to connect with patients for Virtual Visits (Telemedicine).  Patients are able to view lab/test results, encounter notes, upcoming appointments, etc.  Non-urgent messages can be sent to your provider as well.   To learn more about what you can do with MyChart, go to [email or web address].

## 2024-01-18 ENCOUNTER — Ambulatory Visit: Payer: Self-pay | Admitting: Cardiology

## 2024-02-15 ENCOUNTER — Ambulatory Visit: Attending: Cardiology

## 2024-02-15 ENCOUNTER — Ambulatory Visit: Payer: Self-pay | Admitting: Cardiology

## 2024-02-15 DIAGNOSIS — I495 Sick sinus syndrome: Secondary | ICD-10-CM

## 2024-02-15 LAB — CUP PACEART REMOTE DEVICE CHECK
Battery Remaining Longevity: 109 mo
Battery Voltage: 3.01 V
Brady Statistic AP VP Percent: 0.14 %
Brady Statistic AP VS Percent: 98.03 %
Brady Statistic AS VP Percent: 0.01 %
Brady Statistic AS VS Percent: 1.82 %
Brady Statistic RA Percent Paced: 98.16 %
Brady Statistic RV Percent Paced: 0.16 %
Date Time Interrogation Session: 20260129213156
Implantable Lead Connection Status: 753985
Implantable Lead Connection Status: 753985
Implantable Lead Implant Date: 20210806
Implantable Lead Implant Date: 20210806
Implantable Lead Location: 753859
Implantable Lead Location: 753860
Implantable Lead Model: 5076
Implantable Lead Model: 5076
Implantable Pulse Generator Implant Date: 20210806
Lead Channel Impedance Value: 323 Ohm
Lead Channel Impedance Value: 380 Ohm
Lead Channel Impedance Value: 437 Ohm
Lead Channel Impedance Value: 532 Ohm
Lead Channel Pacing Threshold Amplitude: 0.5 V
Lead Channel Pacing Threshold Amplitude: 0.75 V
Lead Channel Pacing Threshold Pulse Width: 0.4 ms
Lead Channel Pacing Threshold Pulse Width: 0.4 ms
Lead Channel Sensing Intrinsic Amplitude: 13.25 mV
Lead Channel Sensing Intrinsic Amplitude: 13.25 mV
Lead Channel Sensing Intrinsic Amplitude: 2.375 mV
Lead Channel Sensing Intrinsic Amplitude: 2.375 mV
Lead Channel Setting Pacing Amplitude: 1.5 V
Lead Channel Setting Pacing Amplitude: 2 V
Lead Channel Setting Pacing Pulse Width: 0.4 ms
Lead Channel Setting Sensing Sensitivity: 1.2 mV
Zone Setting Status: 755011

## 2024-02-19 NOTE — Progress Notes (Signed)
 Remote PPM Transmission

## 2024-05-16 ENCOUNTER — Ambulatory Visit

## 2024-08-15 ENCOUNTER — Ambulatory Visit

## 2024-11-14 ENCOUNTER — Ambulatory Visit

## 2025-02-13 ENCOUNTER — Ambulatory Visit
